# Patient Record
Sex: Male | Born: 1985
Health system: Southern US, Community
[De-identification: ages and names within clinical notes are randomized; demographics above are authoritative.]

---

## 2019-05-18 DIAGNOSIS — Z8673 Personal history of transient ischemic attack (TIA), and cerebral infarction without residual deficits: Secondary | ICD-10-CM

## 2019-05-18 HISTORY — DX: Personal history of transient ischemic attack (TIA), and cerebral infarction without residual deficits: Z86.73

## 2020-07-03 ENCOUNTER — Inpatient Hospital Stay (HOSPITAL_COMMUNITY): Payer: Medicaid Other | Admitting: Certified Registered Nurse Anesthetist

## 2020-07-03 ENCOUNTER — Encounter (HOSPITAL_COMMUNITY): Payer: Self-pay | Admitting: Neurology

## 2020-07-03 ENCOUNTER — Inpatient Hospital Stay (HOSPITAL_COMMUNITY)
Admission: EM | Admit: 2020-07-03 | Discharge: 2020-07-12 | DRG: 023 | Disposition: A | Discharge status: Rehabilitation Facility | Payer: Medicaid Other | Attending: Neurology | Admitting: Neurology

## 2020-07-03 ENCOUNTER — Inpatient Hospital Stay (HOSPITAL_COMMUNITY): Payer: Medicaid Other

## 2020-07-03 ENCOUNTER — Emergency Department (HOSPITAL_COMMUNITY): Payer: Medicaid Other

## 2020-07-03 ENCOUNTER — Other Ambulatory Visit: Payer: Self-pay

## 2020-07-03 ENCOUNTER — Encounter (HOSPITAL_COMMUNITY)
Admission: EM | Disposition: A | Discharge status: Rehabilitation Facility | Payer: Self-pay | Source: Home / Self Care | Attending: Neurology

## 2020-07-03 DIAGNOSIS — I63031 Cerebral infarction due to thrombosis of right carotid artery: Secondary | ICD-10-CM

## 2020-07-03 DIAGNOSIS — R509 Fever, unspecified: Secondary | ICD-10-CM

## 2020-07-03 DIAGNOSIS — R918 Other nonspecific abnormal finding of lung field: Secondary | ICD-10-CM

## 2020-07-03 DIAGNOSIS — G936 Cerebral edema: Secondary | ICD-10-CM | POA: Diagnosis not present

## 2020-07-03 DIAGNOSIS — I10 Essential (primary) hypertension: Secondary | ICD-10-CM | POA: Diagnosis present

## 2020-07-03 DIAGNOSIS — R4189 Other symptoms and signs involving cognitive functions and awareness: Secondary | ICD-10-CM | POA: Diagnosis present

## 2020-07-03 DIAGNOSIS — R4702 Dysphasia: Secondary | ICD-10-CM | POA: Diagnosis present

## 2020-07-03 DIAGNOSIS — R1310 Dysphagia, unspecified: Secondary | ICD-10-CM | POA: Diagnosis present

## 2020-07-03 DIAGNOSIS — E669 Obesity, unspecified: Secondary | ICD-10-CM | POA: Diagnosis present

## 2020-07-03 DIAGNOSIS — D62 Acute posthemorrhagic anemia: Secondary | ICD-10-CM | POA: Diagnosis not present

## 2020-07-03 DIAGNOSIS — J690 Pneumonitis due to inhalation of food and vomit: Secondary | ICD-10-CM | POA: Diagnosis not present

## 2020-07-03 DIAGNOSIS — Z87891 Personal history of nicotine dependence: Secondary | ICD-10-CM | POA: Diagnosis not present

## 2020-07-03 DIAGNOSIS — E785 Hyperlipidemia, unspecified: Secondary | ICD-10-CM | POA: Diagnosis present

## 2020-07-03 DIAGNOSIS — R29810 Facial weakness: Secondary | ICD-10-CM | POA: Diagnosis present

## 2020-07-03 DIAGNOSIS — J9601 Acute respiratory failure with hypoxia: Secondary | ICD-10-CM | POA: Diagnosis present

## 2020-07-03 DIAGNOSIS — I6930 Unspecified sequelae of cerebral infarction: Secondary | ICD-10-CM

## 2020-07-03 DIAGNOSIS — Z8249 Family history of ischemic heart disease and other diseases of the circulatory system: Secondary | ICD-10-CM | POA: Diagnosis not present

## 2020-07-03 DIAGNOSIS — R569 Unspecified convulsions: Secondary | ICD-10-CM | POA: Diagnosis present

## 2020-07-03 DIAGNOSIS — I639 Cerebral infarction, unspecified: Secondary | ICD-10-CM | POA: Diagnosis present

## 2020-07-03 DIAGNOSIS — I6601 Occlusion and stenosis of right middle cerebral artery: Secondary | ICD-10-CM

## 2020-07-03 DIAGNOSIS — R29717 NIHSS score 17: Secondary | ICD-10-CM | POA: Diagnosis present

## 2020-07-03 DIAGNOSIS — G8194 Hemiplegia, unspecified affecting left nondominant side: Secondary | ICD-10-CM | POA: Diagnosis present

## 2020-07-03 DIAGNOSIS — G928 Other toxic encephalopathy: Secondary | ICD-10-CM | POA: Diagnosis present

## 2020-07-03 DIAGNOSIS — Z6834 Body mass index (BMI) 34.0-34.9, adult: Secondary | ICD-10-CM

## 2020-07-03 DIAGNOSIS — R4781 Slurred speech: Secondary | ICD-10-CM | POA: Diagnosis present

## 2020-07-03 DIAGNOSIS — Z87898 Personal history of other specified conditions: Secondary | ICD-10-CM

## 2020-07-03 DIAGNOSIS — I63411 Cerebral infarction due to embolism of right middle cerebral artery: Principal | ICD-10-CM | POA: Diagnosis present

## 2020-07-03 DIAGNOSIS — Z20822 Contact with and (suspected) exposure to covid-19: Secondary | ICD-10-CM | POA: Diagnosis present

## 2020-07-03 DIAGNOSIS — R4701 Aphasia: Secondary | ICD-10-CM | POA: Diagnosis present

## 2020-07-03 DIAGNOSIS — G9340 Encephalopathy, unspecified: Secondary | ICD-10-CM | POA: Diagnosis present

## 2020-07-03 DIAGNOSIS — J9690 Respiratory failure, unspecified, unspecified whether with hypoxia or hypercapnia: Secondary | ICD-10-CM | POA: Diagnosis present

## 2020-07-03 DIAGNOSIS — I69391 Dysphagia following cerebral infarction: Secondary | ICD-10-CM

## 2020-07-03 DIAGNOSIS — I63511 Cerebral infarction due to unspecified occlusion or stenosis of right middle cerebral artery: Secondary | ICD-10-CM

## 2020-07-03 DIAGNOSIS — D72829 Elevated white blood cell count, unspecified: Secondary | ICD-10-CM | POA: Diagnosis not present

## 2020-07-03 HISTORY — PX: IR CT HEAD LTD: IMG2386

## 2020-07-03 HISTORY — PX: IR US GUIDE VASC ACCESS RIGHT: IMG2390

## 2020-07-03 HISTORY — PX: RADIOLOGY WITH ANESTHESIA: SHX6223

## 2020-07-03 HISTORY — PX: IR PERCUTANEOUS ART THROMBECTOMY/INFUSION INTRACRANIAL INC DIAG ANGIO: IMG6087

## 2020-07-03 HISTORY — PX: IR ANGIO INTRA EXTRACRAN SEL COM CAROTID INNOMINATE UNI L MOD SED: IMG5358

## 2020-07-03 LAB — CBC
HCT: 44.8 % (ref 39.0–52.0)
Hemoglobin: 15.6 g/dL (ref 13.0–17.0)
MCH: 31.6 pg (ref 26.0–34.0)
MCHC: 34.8 g/dL (ref 30.0–36.0)
MCV: 90.9 fL (ref 80.0–100.0)
Platelets: 238 10*3/uL (ref 150–400)
RBC: 4.93 MIL/uL (ref 4.22–5.81)
RDW: 11.8 % (ref 11.5–15.5)
WBC: 10.5 10*3/uL (ref 4.0–10.5)
nRBC: 0 % (ref 0.0–0.2)

## 2020-07-03 LAB — DIFFERENTIAL
Abs Immature Granulocytes: 0.33 10*3/uL — ABNORMAL HIGH (ref 0.00–0.07)
Basophils Absolute: 0.1 10*3/uL (ref 0.0–0.1)
Basophils Relative: 1 %
Eosinophils Absolute: 0.1 10*3/uL (ref 0.0–0.5)
Eosinophils Relative: 1 %
Immature Granulocytes: 3 %
Lymphocytes Relative: 26 %
Lymphs Abs: 2.7 10*3/uL (ref 0.7–4.0)
Monocytes Absolute: 0.9 10*3/uL (ref 0.1–1.0)
Monocytes Relative: 9 %
Neutro Abs: 6.3 10*3/uL (ref 1.7–7.7)
Neutrophils Relative %: 60 %

## 2020-07-03 LAB — COMPREHENSIVE METABOLIC PANEL
ALT: 36 U/L (ref 0–44)
AST: 39 U/L (ref 15–41)
Albumin: 4.4 g/dL (ref 3.5–5.0)
Alkaline Phosphatase: 86 U/L (ref 38–126)
Anion gap: 13 (ref 5–15)
BUN: 10 mg/dL (ref 6–20)
CO2: 21 mmol/L — ABNORMAL LOW (ref 22–32)
Calcium: 9.4 mg/dL (ref 8.9–10.3)
Chloride: 103 mmol/L (ref 98–111)
Creatinine, Ser: 1.01 mg/dL (ref 0.61–1.24)
GFR, Estimated: >60 mL/min (ref >60)
Glucose, Bld: 116 mg/dL — ABNORMAL HIGH (ref 70–99)
Potassium: 3.8 mmol/L (ref 3.5–5.1)
Sodium: 137 mmol/L (ref 135–145)
Total Bilirubin: 0.7 mg/dL (ref 0.3–1.2)
Total Protein: 7.4 g/dL (ref 6.5–8.1)

## 2020-07-03 LAB — I-STAT CHEM 8, ED
BUN: 12 mg/dL (ref 6–20)
Calcium, Ion: 1.11 mmol/L — ABNORMAL LOW (ref 1.15–1.40)
Chloride: 105 mmol/L (ref 98–111)
Creatinine, Ser: 0.8 mg/dL (ref 0.61–1.24)
Glucose, Bld: 108 mg/dL — ABNORMAL HIGH (ref 70–99)
HCT: 45 % (ref 39.0–52.0)
Hemoglobin: 15.3 g/dL (ref 13.0–17.0)
Potassium: 3.8 mmol/L (ref 3.5–5.1)
Sodium: 139 mmol/L (ref 135–145)
TCO2: 21 mmol/L — ABNORMAL LOW (ref 22–32)

## 2020-07-03 LAB — PROTIME-INR
INR: 1 (ref 0.8–1.2)
Prothrombin Time: 12.6 seconds (ref 11.4–15.2)

## 2020-07-03 LAB — POCT I-STAT 7, (LYTES, BLD GAS, ICA,H+H)
Acid-base deficit: 2 mmol/L (ref 0.0–2.0)
Bicarbonate: 24.2 mmol/L (ref 20.0–28.0)
Calcium, Ion: 1.16 mmol/L (ref 1.15–1.40)
HCT: 44 % (ref 39.0–52.0)
Hemoglobin: 15 g/dL (ref 13.0–17.0)
O2 Saturation: 99 %
Patient temperature: 98
Potassium: 4.3 mmol/L (ref 3.5–5.1)
Sodium: 137 mmol/L (ref 135–145)
TCO2: 26 mmol/L (ref 22–32)
pCO2 arterial: 46.8 mmHg (ref 32.0–48.0)
pH, Arterial: 7.319 — ABNORMAL LOW (ref 7.350–7.450)
pO2, Arterial: 127 mmHg — ABNORMAL HIGH (ref 83.0–108.0)

## 2020-07-03 LAB — RESPIRATORY PANEL BY RT PCR (FLU A&B, COVID)
Influenza A by PCR: NEGATIVE
Influenza B by PCR: NEGATIVE
SARS Coronavirus 2 by RT PCR: NEGATIVE

## 2020-07-03 LAB — URINALYSIS, ROUTINE W REFLEX MICROSCOPIC
Bilirubin Urine: NEGATIVE
Glucose, UA: NEGATIVE mg/dL
Hgb urine dipstick: NEGATIVE
Ketones, ur: NEGATIVE mg/dL
Leukocytes,Ua: NEGATIVE
Nitrite: NEGATIVE
Protein, ur: NEGATIVE mg/dL
Specific Gravity, Urine: >1.046 — ABNORMAL HIGH (ref 1.005–1.030)
pH: 5 (ref 5.0–8.0)

## 2020-07-03 LAB — CBG MONITORING, ED: Glucose-Capillary: 111 mg/dL — ABNORMAL HIGH (ref 70–99)

## 2020-07-03 LAB — RAPID URINE DRUG SCREEN, HOSP PERFORMED
Amphetamines: NOT DETECTED
Barbiturates: NOT DETECTED
Benzodiazepines: NOT DETECTED
Cocaine: NOT DETECTED
Opiates: NOT DETECTED
Tetrahydrocannabinol: NOT DETECTED

## 2020-07-03 LAB — MRSA PCR SCREENING: MRSA by PCR: NEGATIVE

## 2020-07-03 LAB — ETHANOL: Alcohol, Ethyl (B): <10 mg/dL (ref <10)

## 2020-07-03 LAB — FIBRINOGEN: Fibrinogen: 220 mg/dL (ref 210–475)

## 2020-07-03 LAB — HIV ANTIBODY (ROUTINE TESTING W REFLEX): HIV Screen 4th Generation wRfx: NONREACTIVE

## 2020-07-03 LAB — APTT: aPTT: 26 seconds (ref 24–36)

## 2020-07-03 SURGERY — IR WITH ANESTHESIA
Anesthesia: General

## 2020-07-03 MED ORDER — SUCCINYLCHOLINE CHLORIDE 200 MG/10ML IV SOSY
PREFILLED_SYRINGE | INTRAVENOUS | Status: DC | PRN
  Administered 2020-07-03: 140 mg via INTRAVENOUS

## 2020-07-03 MED ORDER — NITROGLYCERIN 1 MG/10 ML FOR IR/CATH LAB
INTRA_ARTERIAL | Status: DC | PRN
  Administered 2020-07-03 (×2): 25 ug via INTRA_ARTERIAL

## 2020-07-03 MED ORDER — IOHEXOL 350 MG/ML SOLN
60.0000 mL | Freq: Once | INTRAVENOUS | Status: AC | PRN
  Administered 2020-07-03: 60 mL via INTRAVENOUS

## 2020-07-03 MED ORDER — DOCUSATE SODIUM 50 MG/5ML PO LIQD
100.0000 mg | Freq: Two times a day (BID) | ORAL | Status: DC
  Administered 2020-07-04 – 2020-07-11 (×13): 100 mg via ORAL
  Filled 2020-07-03 (×15): qty 10

## 2020-07-03 MED ORDER — ACETAMINOPHEN 325 MG PO TABS
650.0000 mg | ORAL_TABLET | ORAL | Status: DC | PRN
  Administered 2020-07-04 – 2020-07-09 (×6): 650 mg via ORAL
  Filled 2020-07-03 (×6): qty 2

## 2020-07-03 MED ORDER — ACETAMINOPHEN 160 MG/5ML PO SOLN: 650.0000 mg | ORAL | Status: DC | PRN

## 2020-07-03 MED ORDER — EPTIFIBATIDE 20 MG/10ML IV SOLN
INTRAVENOUS | Status: AC
  Filled 2020-07-03: qty 10

## 2020-07-03 MED ORDER — CLEVIDIPINE BUTYRATE 0.5 MG/ML IV EMUL
0.0000 mg/h | INTRAVENOUS | Status: DC
  Administered 2020-07-03: 2 mg/h via INTRAVENOUS
  Administered 2020-07-03: 6 mg/h via INTRAVENOUS
  Administered 2020-07-04: 2 mg/h via INTRAVENOUS
  Administered 2020-07-04: 8 mg/h via INTRAVENOUS
  Filled 2020-07-03 (×4): qty 50

## 2020-07-03 MED ORDER — LABETALOL HCL 5 MG/ML IV SOLN
INTRAVENOUS | Status: DC | PRN
  Administered 2020-07-03: 2.5 mg via INTRAVENOUS
  Administered 2020-07-03: 7.5 mg via INTRAVENOUS
  Administered 2020-07-03 (×3): 10 mg via INTRAVENOUS

## 2020-07-03 MED ORDER — NITROGLYCERIN 1 MG/10 ML FOR IR/CATH LAB
100.0000 ug | Freq: Once | INTRA_ARTERIAL | Status: DC
  Filled 2020-07-03: qty 1

## 2020-07-03 MED ORDER — SODIUM CHLORIDE 0.9 % IV SOLN: INTRAVENOUS | Status: DC

## 2020-07-03 MED ORDER — LIDOCAINE 2% (20 MG/ML) 5 ML SYRINGE
INTRAMUSCULAR | Status: DC | PRN
  Administered 2020-07-03: 100 mg via INTRAVENOUS

## 2020-07-03 MED ORDER — ONDANSETRON HCL 4 MG/2ML IJ SOLN
INTRAMUSCULAR | Status: DC | PRN
  Administered 2020-07-03: 4 mg via INTRAVENOUS

## 2020-07-03 MED ORDER — LABETALOL HCL 5 MG/ML IV SOLN: 20.0000 mg | Freq: Once | INTRAVENOUS | Status: DC

## 2020-07-03 MED ORDER — SENNOSIDES-DOCUSATE SODIUM 8.6-50 MG PO TABS: 1.0000 | ORAL_TABLET | Freq: Every evening | ORAL | Status: DC | PRN

## 2020-07-03 MED ORDER — PROPOFOL 10 MG/ML IV BOLUS
INTRAVENOUS | Status: DC | PRN
  Administered 2020-07-03: 150 mg via INTRAVENOUS

## 2020-07-03 MED ORDER — VERAPAMIL HCL 2.5 MG/ML IV SOLN
INTRAVENOUS | Status: AC
  Filled 2020-07-03: qty 2

## 2020-07-03 MED ORDER — PROPOFOL 1000 MG/100ML IV EMUL
0.0000 ug/kg/min | INTRAVENOUS | Status: DC
  Administered 2020-07-03 – 2020-07-04 (×4): 50 ug/kg/min via INTRAVENOUS
  Filled 2020-07-03 (×3): qty 100

## 2020-07-03 MED ORDER — TIROFIBAN HCL IN NACL 5-0.9 MG/100ML-% IV SOLN
INTRAVENOUS | Status: AC
  Filled 2020-07-03: qty 100

## 2020-07-03 MED ORDER — ORAL CARE MOUTH RINSE
15.0000 mL | OROMUCOSAL | Status: DC
  Administered 2020-07-03 – 2020-07-04 (×5): 15 mL via OROMUCOSAL

## 2020-07-03 MED ORDER — PROPOFOL 500 MG/50ML IV EMUL
INTRAVENOUS | Status: DC | PRN
  Administered 2020-07-03: 80 ug/kg/min via INTRAVENOUS

## 2020-07-03 MED ORDER — CLEVIDIPINE BUTYRATE 0.5 MG/ML IV EMUL: 0.0000 mg/h | INTRAVENOUS | Status: DC

## 2020-07-03 MED ORDER — STROKE: EARLY STAGES OF RECOVERY BOOK
Freq: Once | Status: AC
  Filled 2020-07-03: qty 1

## 2020-07-03 MED ORDER — LACTATED RINGERS IV SOLN: INTRAVENOUS | Status: DC | PRN

## 2020-07-03 MED ORDER — FENTANYL CITRATE (PF) 100 MCG/2ML IJ SOLN
50.0000 ug | INTRAMUSCULAR | Status: DC | PRN
  Administered 2020-07-03: 50 ug via INTRAVENOUS
  Filled 2020-07-03: qty 2

## 2020-07-03 MED ORDER — SODIUM CHLORIDE 0.9 % IV SOLN
50.0000 mL | Freq: Once | INTRAVENOUS | Status: AC
  Administered 2020-07-03: 50 mL via INTRAVENOUS

## 2020-07-03 MED ORDER — ROCURONIUM BROMIDE 10 MG/ML (PF) SYRINGE
PREFILLED_SYRINGE | INTRAVENOUS | Status: DC | PRN
  Administered 2020-07-03: 60 mg via INTRAVENOUS

## 2020-07-03 MED ORDER — ACETAMINOPHEN 325 MG PO TABS: 650.0000 mg | ORAL_TABLET | ORAL | Status: DC | PRN

## 2020-07-03 MED ORDER — LORAZEPAM 2 MG/ML IJ SOLN
INTRAMUSCULAR | Status: AC
  Filled 2020-07-03: qty 1

## 2020-07-03 MED ORDER — SENNOSIDES-DOCUSATE SODIUM 8.6-50 MG PO TABS
1.0000 | ORAL_TABLET | Freq: Two times a day (BID) | ORAL | Status: DC
  Administered 2020-07-04 – 2020-07-11 (×15): 1 via ORAL
  Filled 2020-07-03 (×16): qty 1

## 2020-07-03 MED ORDER — DEXAMETHASONE SODIUM PHOSPHATE 10 MG/ML IJ SOLN
INTRAMUSCULAR | Status: DC | PRN
  Administered 2020-07-03: 4 mg via INTRAVENOUS

## 2020-07-03 MED ORDER — PANTOPRAZOLE SODIUM 40 MG IV SOLR
40.0000 mg | Freq: Every day | INTRAVENOUS | Status: DC
  Administered 2020-07-03 – 2020-07-04 (×2): 40 mg via INTRAVENOUS
  Filled 2020-07-03 (×2): qty 40

## 2020-07-03 MED ORDER — LORAZEPAM 2 MG/ML IJ SOLN: 1.0000 mg | Freq: Once | INTRAMUSCULAR | Status: DC

## 2020-07-03 MED ORDER — POLYETHYLENE GLYCOL 3350 17 G PO PACK
17.0000 g | PACK | Freq: Every day | ORAL | Status: DC
  Administered 2020-07-06 – 2020-07-11 (×6): 17 g via ORAL
  Filled 2020-07-03 (×7): qty 1

## 2020-07-03 MED ORDER — CANGRELOR TETRASODIUM 50 MG IV SOLR
INTRAVENOUS | Status: AC
  Filled 2020-07-03: qty 50

## 2020-07-03 MED ORDER — PROPOFOL 1000 MG/100ML IV EMUL
INTRAVENOUS | Status: AC
  Filled 2020-07-03: qty 100

## 2020-07-03 MED ORDER — ASPIRIN 81 MG PO CHEW
CHEWABLE_TABLET | ORAL | Status: AC
  Filled 2020-07-03: qty 1

## 2020-07-03 MED ORDER — CHLORHEXIDINE GLUCONATE 0.12% ORAL RINSE (MEDLINE KIT)
15.0000 mL | Freq: Two times a day (BID) | OROMUCOSAL | Status: DC
  Administered 2020-07-03 – 2020-07-11 (×14): 15 mL via OROMUCOSAL

## 2020-07-03 MED ORDER — VERAPAMIL HCL 2.5 MG/ML IV SOLN
INTRAVENOUS | Status: DC | PRN
  Administered 2020-07-03 (×2): 2.5 mg via INTRA_ARTERIAL

## 2020-07-03 MED ORDER — CEFAZOLIN SODIUM-DEXTROSE 2-4 GM/100ML-% IV SOLN
2.0000 g | Freq: Once | INTRAVENOUS | Status: AC
  Administered 2020-07-03: 2 g via INTRAVENOUS
  Filled 2020-07-03: qty 100

## 2020-07-03 MED ORDER — TICAGRELOR 90 MG PO TABS
ORAL_TABLET | ORAL | Status: AC
  Filled 2020-07-03: qty 2

## 2020-07-03 MED ORDER — LEVETIRACETAM IN NACL 1500 MG/100ML IV SOLN
1500.0000 mg | INTRAVENOUS | Status: DC
  Administered 2020-07-03: 1500 mg via INTRAVENOUS
  Filled 2020-07-03: qty 100

## 2020-07-03 MED ORDER — CLOPIDOGREL BISULFATE 300 MG PO TABS
ORAL_TABLET | ORAL | Status: AC
  Filled 2020-07-03: qty 1

## 2020-07-03 MED ORDER — NITROGLYCERIN 1 MG/10 ML FOR IR/CATH LAB
INTRA_ARTERIAL | Status: AC | PRN
  Administered 2020-07-03 (×2): 25 ug via INTRA_ARTERIAL

## 2020-07-03 MED ORDER — FENTANYL CITRATE (PF) 100 MCG/2ML IJ SOLN
50.0000 ug | INTRAMUSCULAR | Status: DC | PRN
  Administered 2020-07-03 – 2020-07-04 (×3): 100 ug via INTRAVENOUS
  Filled 2020-07-03 (×3): qty 2

## 2020-07-03 MED ORDER — ALTEPLASE (STROKE) FULL DOSE INFUSION
0.9000 mg/kg | Freq: Once | INTRAVENOUS | Status: AC
  Administered 2020-07-03: 81.3 mg via INTRAVENOUS
  Filled 2020-07-03: qty 100

## 2020-07-03 MED ORDER — IOHEXOL 300 MG/ML  SOLN
50.0000 mL | Freq: Once | INTRAMUSCULAR | Status: AC | PRN
  Administered 2020-07-03: 25 mL via INTRA_ARTERIAL

## 2020-07-03 MED ORDER — IOHEXOL 300 MG/ML  SOLN
150.0000 mL | Freq: Once | INTRAMUSCULAR | Status: AC | PRN
  Administered 2020-07-03: 75 mL via INTRA_ARTERIAL

## 2020-07-03 MED ORDER — ACETAMINOPHEN 650 MG RE SUPP: 650.0000 mg | RECTAL | Status: DC | PRN

## 2020-07-03 MED ORDER — ESMOLOL HCL 100 MG/10ML IV SOLN
INTRAVENOUS | Status: DC | PRN
  Administered 2020-07-03: 30 mg via INTRAVENOUS
  Administered 2020-07-03: 20 mg via INTRAVENOUS

## 2020-07-03 NOTE — Anesthesia Procedure Notes (Addendum)
Procedure Name: Intubation Performed by: Ezekiel Ina, CRNA Pre-anesthesia Checklist: Patient identified, Emergency Drugs available, Suction available and Patient being monitored Patient Re-evaluated:Patient Re-evaluated prior to induction Oxygen Delivery Method: Circle System Utilized Preoxygenation: Pre-oxygenation with 100% oxygen Induction Type: IV induction, Rapid sequence and Cricoid Pressure applied Laryngoscope Size: Miller and 2 Grade View: Grade I Tube type: Oral Tube size: 7.0 mm Number of attempts: 1 Airway Equipment and Method: Stylet and Oral airway Placement Confirmation: ETT inserted through vocal cords under direct vision,  positive ETCO2 and breath sounds checked- equal and bilateral Secured at: 22 cm Tube secured with: Tape Dental Injury: Teeth and Oropharynx as per pre-operative assessment

## 2020-07-03 NOTE — Progress Notes (Signed)
Attempted to call family mbr Tamela Oddi at [phone] to given her address of hospital. No answer received.

## 2020-07-03 NOTE — Consult Note (Signed)
NAME:  Mark Oneal, MRN:  [id number], DOB:  [birth year], LOS: 0 ADMISSION DATE:  07/03/2020, CONSULTATION DATE:  07/03/20 REFERRING MD:  Corliss Skains  CHIEF COMPLAINT:  ICA occlusion s/p revascularization   Brief History   Mark Oneal is a 34 y.o. male who was admitted 10/18 with right ICA, right MCA, right ACA occlusion.  He received tPA and was taken for IR revascularization.  History of present illness   Pt is encephelopathic; therefore, this HPI is obtained from chart review. Mark Oneal is a 34 y.o. male who has a PMH including but not limited to prior CVA per neuro H&P (? years ago in [place]).  He presented to [facility] ED 10/18 as code stroke.  He was at a supermarket when he was noted to have seizure like activity followed by left sided weakness.    CTA demonstrated R ICA and R MCA occlusions.  He received tPA at 1356.  He was then taken to IR for revascularization.  He had complete revascularization of occluded R ICA, R MCA, R ACA, and thereafter, was transferred to the neuro ICU for ongoing care.  PCCM was asked to assist with vent management.  Past Medical History  has Stroke (cerebrum) [facility]) on their problem list.  Significant Hospital Events   10/18 > Admit.  Consults:  IR, PCCM.  Procedures:  ETT 10/18 >   Significant Diagnostic Tests:  CT head 10/18 > no acute hemorrhage.  Suspected thrombus in right MCA. CTA head 10/18 > occlusion of right ICA with subsequent occlusion of proximal right M1 MCA. CT head 10/19 >  MRI brain 10/19 >  Echo 10/19 >   Micro Data:  COVID 10/18 > neg. Flu 10/18 > neg.  Antimicrobials:  None.   Interim history/subjective:  Sedated, comfortable.  Objective:  Blood pressure (!) 149/84, weight 90.3 kg.       No intake or output data in the 24 hours ending 07/03/20 1710 Filed Weights   07/03/20 1300  Weight: 90.3 kg    Examination: General: Adult male, in NAD. Neuro: Sedated, not following commands. HEENT: [place]/AT. Sclerae anicteric.  ETT  in place. Cardiovascular: RRR, no M/R/G.  Lungs: Respirations even and unlabored.  CTA bilaterally, No W/R/R.   Abdomen: BS x 4, soft, NT/ND.  Musculoskeletal: No gross deformities, no edema.  Skin: Intact, warm, no rashes.  Assessment & Plan:   R ICA, R MCA, R ACA occlusion - s/p tPA followed by IR revascularization. - Post procedure management per IR. - Stroke workup / management per neuro. - F/u on CT head, MRI brain, echo.   Hypertension. - Cleviprex PRN for goal SBP 120 - 140 per IR.  Respiratory insufficiency - due to inability to protect the airway in the setting of above. - Full vent support. - Assess ABG. - Wean as able. - Daily SBT with goal extubation in AM. - Bronchial hygiene. - Follow CXR.    Best Practice:  Diet: NPO. Pain/Anxiety/Delirium protocol (if indicated): Propofol gtt / Fentanyl PRN. RASS goal -1. VAP protocol (if indicated): In place. DVT prophylaxis: SCD's only. GI prophylaxis: PPI. Glucose control: SSI if glucose consistently > 180. Mobility: Bedrest. Code Status: Full. Family Communication: Per primary. Disposition: ICU.  Labs   CBC: Recent Labs  Lab 07/03/20 1340 07/03/20 1344  WBC  --  10.5  NEUTROABS  --  6.3  HGB 15.3 15.6  HCT 45.0 44.8  MCV  --  90.9  PLT  --  238   Basic Metabolic  Panel: Recent Labs  Lab 07/03/20 1340 07/03/20 1344  NA 139 137  K 3.8 3.8  CL 105 103  CO2  --  21*  GLUCOSE 108* 116*  BUN 12 10  CREATININE 0.80 1.01  CALCIUM  --  9.4   GFR: CrCl cannot be calculated (Unknown ideal weight.). Recent Labs  Lab 07/03/20 1344  WBC 10.5   Liver Function Tests: Recent Labs  Lab 07/03/20 1344  AST 39  ALT 36  ALKPHOS 86  BILITOT 0.7  PROT 7.4  ALBUMIN 4.4   No results for input(s): LIPASE, AMYLASE in the last 168 hours. No results for input(s): AMMONIA in the last 168 hours. ABG    Component Value Date/Time   TCO2 21 (L) 07/03/2020 1340    Coagulation Profile: Recent Labs  Lab  07/03/20 1344  INR 1.0   Cardiac Enzymes: No results for input(s): CKTOTAL, CKMB, CKMBINDEX, TROPONINI in the last 168 hours. HbA1C: No results found for: HGBA1C CBG: Recent Labs  Lab 07/03/20 1340  GLUCAP 111*    Review of Systems:   Unable to obtain as pt is encephalopathic.  Past medical history  He,  has no past medical history on file.   Surgical History   Unavailable.  Social History      Family history   His family history includes Hypertension in his father and mother.   Allergies Not on File   Home meds  Prior to Admission medications   Not on File    Critical care time: 35 min.    Rutherford Guys, [place] [facility] 07/03/2020, 5:10 PM

## 2020-07-03 NOTE — Progress Notes (Signed)
PHARMACIST CODE STROKE RESPONSE  Notified to mix tPA at 1:53 by Dr. Derry Lory Delivered tPA to RN at 1:56  tPA dose = 8.1 mg bolus over 1 minute followed by 73.2 mg for a total dose of 81.3 mg over 1 hour  Issues/delays encountered (if applicable): n/a  De Burrs  PGY1 pharmacy resident 07/03/20 2:02 PM

## 2020-07-03 NOTE — Anesthesia Procedure Notes (Signed)
Arterial Line Insertion Start/End10/18/2021 2:45 PM, 07/03/2020 2:53 PM Performed by: Ezekiel Ina, CRNA, CRNA  Patient location: Pre-op. Preanesthetic checklist: patient identified, IV checked, site marked, risks and benefits discussed, surgical consent, monitors and equipment checked, pre-op evaluation, timeout performed and anesthesia consent Lidocaine 1% used for infiltration Left, radial was placed Catheter size: 20 G Hand hygiene performed , maximum sterile barriers used  and Seldinger technique used  Attempts: 1 Procedure performed without using ultrasound guided technique. Following insertion, dressing applied. Post procedure assessment: normal and unchanged  Patient tolerated the procedure well with no immediate complications.

## 2020-07-03 NOTE — Progress Notes (Signed)
All belongings sent home with wife

## 2020-07-03 NOTE — H&P (Signed)
Neurology history and physical  Reason for Consult: Code stroke Referring Physician: Tegeler  CC: Left-sided plegia with observed seizure  History is obtained from: EMS  HPI: Mark Oneal is a 34 y.o. 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 Per his partner of 8 years, he walks with a slight limp bus is otherwise fine, took him 2 months to recover. His partner got back from the grocery store and patient was able to help her get the groceries inside at around 12:30. He had   Per EMS patient was last known normal at 12:30 on 07/03/20. His words appeared a little slurred when he brought some groceries in, he was feeling a little numb but significant other is not entirely sure what side it was. Then he collapsed on the ground and had jerking of his arms and legs concerning for a seizure. She called EMS, who arrived at the scene and found him to be weak in the LUE and LLE and gaze edviation to the right. He seemed awake, alert and talking to them.  Workup with CTH with hyperdense RMCA and ASPECTS of 8. tPA offered and I initially could not get in touch with the significant other about tPA but I did discuss this with her and she was okay with [place] giving him tPA given the noted concern for stroke and hyperdense RMCA suggestive of an LVO.  CTA head and neck with occlusion of the cervical right ICA with intracranial reconstitution and subsequent occlusion of the proximal right M1 MCA.  We were eventually able to get in touch with the significant other prior to thrombectomy and Dr. Estanislado Pandy did discuss risks and benefits and Ms. [name] gave [place] her consent for thrombectomy.  Thrombectomy with TICI 3 revascularization of the occluded right ICA and right MCA and right ACA. Patient was noted to have a right putamen and caudate hematoma in IR which was noted to be stable on repeat IR CT head about 2 hours later.  Given that the  Nicolaus size was stable, we opted against reversing TPA and instead closely monitoring with another CT head in about 6 hours and tightly controlling blood pressure.  Fibrinogen levels were ordered stat in case reversal is needed.  LKW: 12:30 tpa given?:  Yes hyperdense right MCA Premorbid modified Rankin scale (mRS): 0 Thrombectomy:  NIHSS: 17  Past Medical History: 05/2019: History of stroke involving cerebellum   Family History  Problem Relation Age of Onset  . Hypertension Mother   . Hypertension Father    Social History:   has no history on file for tobacco use, alcohol use, and drug use.  Medications  Current Facility-Administered Medications:  .   stroke: mapping our early stages of recovery book, , Does not apply, Once, Donnetta Simpers, MD .  0.9 %  sodium chloride infusion, , Intravenous, Continuous, Deveshwar, Sanjeev, MD, Last Rate: 75 mL/hr at 07/03/20 1934, Rate Verify at 07/03/20 1934 .  acetaminophen (TYLENOL) tablet 650 mg, 650 mg, Oral, Q4H PRN **OR** acetaminophen (TYLENOL) 160 MG/5ML solution 650 mg, 650 mg, Per Tube, Q4H PRN **OR** acetaminophen (TYLENOL) suppository 650 mg, 650 mg, Rectal, Q4H PRN, Deveshwar, Sanjeev, MD .  chlorhexidine gluconate (MEDLINE KIT) (PERIDEX) 0.12 % solution 15 mL, 15 mL, Mouth Rinse, BID, Donnetta Simpers, MD .  labetalol (NORMODYNE) injection 20 mg, 20 mg, Intravenous, Once **AND** clevidipine (CLEVIPREX) infusion 0.5 mg/mL, 0-21 mg/hr, Intravenous,  Continuous, Donnetta Simpers, MD, Last Rate: 2 mL/hr at 07/03/20 1934, 1 mg/hr at 07/03/20 1934 .  docusate (COLACE) 50 MG/5ML liquid 100 mg, 100 mg, Oral, BID, Desai, Rahul P, PA-C .  fentaNYL (SUBLIMAZE) injection 50 mcg, 50 mcg, Intravenous, Q15 min PRN, Desai, Rahul P, PA-C, 50 mcg at 07/03/20 1808 .  fentaNYL (SUBLIMAZE) injection 50-200 mcg, 50-200 mcg, Intravenous, Q30 min PRN, Desai, Rahul P, PA-C .  levETIRAcetam (KEPPRA) IVPB 1500 mg/ 100 mL premix, 1,500 mg, Intravenous,  STAT, Marliss Coots, PA-C .  LORazepam (ATIVAN) 2 MG/ML injection, , , ,  .  LORazepam (ATIVAN) injection 1 mg, 1 mg, Intravenous, Once, Donnetta Simpers, MD .  MEDLINE mouth rinse, 15 mL, Mouth Rinse, 10 times per day, Donnetta Simpers, MD .  nitroGLYCERIN 1 mg/10 mL (100 mcg/mL) - IR/CATH LAB, 100 mcg, Intra-arterial, Once, Donnetta Simpers, MD .  nitroGLYCERIN 1 mg/10 mL (100 mcg/mL) - IR/CATH LAB, , , PRN, Luanne Bras, MD, 25 mcg at 07/03/20 1634 .  pantoprazole (PROTONIX) injection 40 mg, 40 mg, Intravenous, QHS, Donnetta Simpers, MD .  polyethylene glycol (MIRALAX / GLYCOLAX) packet 17 g, 17 g, Oral, Daily, Desai, Rahul P, PA-C .  propofol (DIPRIVAN) 1000 MG/100ML infusion, 0-50 mcg/kg/min, Intravenous, Continuous, Desai, Rahul P, PA-C, Last Rate: 27 mL/hr at 07/03/20 1934, 50 mcg/kg/min at 07/03/20 1934 .  senna-docusate (Senokot-S) tablet 1 tablet, 1 tablet, Oral, QHS PRN, Donnetta Simpers, MD .  senna-docusate (Senokot-S) tablet 1 tablet, 1 tablet, Oral, BID, Donnetta Simpers, MD .  verapamil (ISOPTIN) 2.5 MG/ML injection, , , ,  .  verapamil (ISOPTIN) injection, , , PRN, Deveshwar, Sanjeev, MD, 2.5 mg at 07/03/20 1616  ROS: Unable to obtain due to altered mental status.    Exam: Current vital signs: BP 112/65   Pulse 79   Temp 98.2 F (36.8 C)   Resp 17   Ht _0  (1.626 m)   Wt 90.3 kg   SpO2 100%   BMI 34.17 kg/m  Vital signs in last 24 hours: Temp:  [98.2 F (36.8 C)] 98.2 F (36.8 C) (10/18 1723) Pulse Rate:  [79-82] 79 (10/18 1915) Resp:  [15-21] 17 (10/18 1915) BP: (105-149)/(61-95) 112/65 (10/18 1915) SpO2:  [100 %] 100 % (10/18 1915) Arterial Line BP: (117-148)/(60-76) 123/66 (10/18 1915) FiO2 (%):  [50 %] 50 % (10/18 1723) Weight:  [90.3 kg] 90.3 kg (10/18 1300)   Constitutional: Appears well-developed and well-nourished.  Psych: Significant confusion Eyes: No scleral injection HENT: No OP obstrucion Head: Normocephalic.   Cardiovascular: Normal rate and regular rhythm.  Respiratory: Effort normal, non-labored breathing GI: Soft.  No distension. There is no tenderness.  Skin: WDI  Neuro: Mental Status: Sitting in the bed, leaning on his left side. Asphasia out of proportion to encephalopathy.  Does follow commands in RUE and RLE. Cranial Nerves: II: Visual Fields are full.  III,IV, VI: R gaze deviation, no ptosis. Pupils equal, round and reactive to light V: Facial sensation is symmetric to temperature VII: L facial droop VIII: hearing is intact to voice X: Palat elevates symmetrically XI: R shoulder goes up with shoulder shrug.  Motor: Moving right arm and leg antigravity however is not moving left arm and leg.  Sensory: Absent in LUE and LLE DSS Deep Tendon Reflexes: 1+ and symmetric in the biceps and patellae. Plantars: Toes are downgoing bilaterally. Cerebellar: FNF and HKS are intact on the Right.  Labs I have reviewed labs in epic and the results pertinent to this consultation  are: Platelet count of 238, INR of 1.0.  CBC    Component Value Date/Time   WBC 10.5 07/03/2020 1344   RBC 4.93 07/03/2020 1344   HGB 15.6 07/03/2020 1344   HCT 44.8 07/03/2020 1344   PLT 238 07/03/2020 1344   MCV 90.9 07/03/2020 1344   MCH 31.6 07/03/2020 1344   MCHC 34.8 07/03/2020 1344   RDW 11.8 07/03/2020 1344   LYMPHSABS 2.7 07/03/2020 1344   MONOABS 0.9 07/03/2020 1344   EOSABS 0.1 07/03/2020 1344   BASOSABS 0.1 07/03/2020 1344    CMP     Component Value Date/Time   NA 137 07/03/2020 1344   K 3.8 07/03/2020 1344   CL 103 07/03/2020 1344   CO2 21 (L) 07/03/2020 1344   GLUCOSE 116 (H) 07/03/2020 1344   BUN 10 07/03/2020 1344   CREATININE 1.01 07/03/2020 1344   CALCIUM 9.4 07/03/2020 1344   PROT 7.4 07/03/2020 1344   ALBUMIN 4.4 07/03/2020 1344   AST 39 07/03/2020 1344   ALT 36 07/03/2020 1344   ALKPHOS 86 07/03/2020 1344   BILITOT 0.7 07/03/2020 1344   GFRNONAA >60 07/03/2020 1344     Imaging I have reviewed the images obtained:  CT-scan of the brain  MRI examination of the brain  Etta Quill PA-C [facility] [phone]  M-F  (9:00 am- 5:00 PM)  07/03/2020, 7:38 PM     Assessment:  34 year old male who presents to the hospital with left hemiplegia, neglect and right gaze deviation found to have hyperdense right MCA on CT head with an aspect score of 8.  tPA was given.  He was found to have cervical right ICA occlusion with distal reconstitution closer to P-comm and a right MCA M1 occlusion at the bifurcation.  He was taken for thrombectomy and noted to have a right putamen and caudate hematoma in the IR which was noted to be stable on repeat IR CT head about 2 hours later.  He had Tici 3 revascularization of right ICA, right MCA and right ACA.  We did discuss potential reversal of TPA however with the noted stability of the bleed on the repeat IR CT head and his fibrinogen level at 220, we instead opted for close monitoring with a repeat CT head in 6 hours and keeping very tight control of his blood pressure between 660-630 systolic.  Impression: - Ischemic R MCA M1 stroke - Acute R MCA M1 occlusion - R cervical ICA occlusion.  Plan: - Frequent NeuroChecks for post tPA care per Neurocritical Care Unit protocol: - Initial CTH demonstrated no acute hemorrhage or mass - MRI Brain - is pending - CTA - with R MCA M1 occlusion - TTE - pending - Lipid Panel: LDL - pending  - Statin: Atorvastatin 25m if LDL > 70. - HbA1c: pending - Antithrombotic: Start ASA 81 mg daily if 24 h CTH does not show acute hemorrhage - DVT prophylaxis: SCDs. Pharmacologic prophylaxis if 24 h CTH does not demonstrate acute hemorrhage - Systolic Blood Pressure goal: 1160-109systolic - Telemetry monitoring for arrhythmia. - Noted to have stable R BG IPH on IR CTH which was stable on repeat IR CTH about 2 hours later. We will get a repeat CTH in 6 hours to ensure  stability. - STAT Fibrinogen levels at 220. - Will need tPA reversal for worsening IPH on repeat CTH. - Swallow screen - ordered - PT/OT/SLP consults  Code Status: Full Code   This patient is critically ill and at significant  risk of neurological worsening, death and care requires constant monitoring of vital signs, hemodynamics,respiratory and cardiac monitoring, neurological assessment, discussion with family, other specialists and medical decision making of high complexity. I spent 110 minutes of neurocritical care time  in the care of  this patient. This was time spent independent of any time provided by nurse practitioner or PA.  Donnetta Simpers [facility] Pager Number [phone] 07/03/2020  7:50 PM

## 2020-07-03 NOTE — ED Provider Notes (Signed)
[facility] EMERGENCY DEPARTMENT Provider Note   CSN: [id number] Arrival date & time: 07/03/20  1333     History Chief Complaint  Patient presents with  . Code Stroke    Mark Oneal is a 34 y.o. male.  He is primarily Spanish-speaking.  He has a prior history of a stroke.  Per EMS he was last known well around 12 PM when he experienced a seizure followed by left-sided weakness and neglect.  He was a stroke activation and met by neurology on arrival and taken immediately to CT.  The history is provided by the patient and the EMS personnel.  Cerebrovascular Accident This is a new problem. The current episode started 1 to 2 hours ago. The problem occurs constantly. The problem has not changed since onset.Pertinent negatives include no chest pain, no abdominal pain, no headaches and no shortness of breath. Nothing aggravates the symptoms. Nothing relieves the symptoms. He has tried nothing for the symptoms. The treatment provided no relief.       History reviewed. No pertinent past medical history.  Patient Active Problem List   Diagnosis Date Noted  . Stroke (cerebrum) ([place]) 07/03/2020    History reviewed. No pertinent surgical history.     Family History  Problem Relation Age of Onset  . Hypertension Mother   . Hypertension Father     Social History   Tobacco Use  . Smoking status: Not on file  Substance Use Topics  . Alcohol use: Not on file  . Drug use: Not on file    Home Medications Prior to Admission medications   Not on File    Allergies    Patient has no allergy information on record.  Review of Systems   Review of Systems  Unable to perform ROS: Acuity of condition  Respiratory: Negative for shortness of breath.   Cardiovascular: Negative for chest pain.  Gastrointestinal: Negative for abdominal pain.  Neurological: Negative for headaches.    Physical Exam Updated Vital Signs BP (!) 149/84   Wt 90.3 kg   Physical  Exam Vitals and nursing note reviewed.  Constitutional:      Appearance: Normal appearance. He is well-developed.  HENT:     Head: Normocephalic and atraumatic.  Eyes:     Conjunctiva/sclera: Conjunctivae normal.  Cardiovascular:     Rate and Rhythm: Normal rate and regular rhythm.     Heart sounds: No murmur heard.   Pulmonary:     Effort: Pulmonary effort is normal. No respiratory distress.     Breath sounds: Normal breath sounds.  Abdominal:     Palpations: Abdomen is soft.     Tenderness: There is no abdominal tenderness.  Musculoskeletal:     Cervical back: Neck supple.  Skin:    General: Skin is warm and dry.  Neurological:     Mental Status: He is alert.     Motor: Weakness present.     Comments: Patient is awake and alert.  He is following commands with right upper extremity and right lower extremity.  He is got 0 out of 5 strength left upper extremity and 1 out of 5 strength left lower extremity.  Right gaze preference and left-sided neglect.     ED Results / Procedures / Treatments   Labs (all labs ordered are listed, but only abnormal results are displayed) Labs Reviewed  DIFFERENTIAL - Abnormal; Notable for the following components:      Result Value   Abs Immature Granulocytes 0.33 (*)  All other components within normal limits  COMPREHENSIVE METABOLIC PANEL - Abnormal; Notable for the following components:   CO2 21 (*)    Glucose, Bld 116 (*)    All other components within normal limits  CBG MONITORING, ED - Abnormal; Notable for the following components:   Glucose-Capillary 111 (*)    All other components within normal limits  I-STAT CHEM 8, ED - Abnormal; Notable for the following components:   Glucose, Bld 108 (*)    Calcium, Ion 1.11 (*)    TCO2 21 (*)    All other components within normal limits  RESPIRATORY PANEL BY RT PCR (FLU A&B, COVID)  ETHANOL  PROTIME-INR  APTT  CBC  RAPID URINE DRUG SCREEN, HOSP PERFORMED  URINALYSIS, ROUTINE W  REFLEX MICROSCOPIC  HIV ANTIBODY (ROUTINE TESTING W REFLEX)  FIBRINOGEN  HEMOGLOBIN A1C  LIPID PANEL    EKG None  Radiology CT HEAD CODE STROKE WO CONTRAST  Result Date: 07/03/2020 CLINICAL DATA:  Code stroke.  Left-sided weakness EXAM: CT HEAD WITHOUT CONTRAST TECHNIQUE: Contiguous axial images were obtained from the base of the skull through the vertex without intravenous contrast. COMPARISON:  None. FINDINGS: Brain: There is no acute intracranial hemorrhage. There is suspected hypoattenuation and loss of gray-white differentiation involving the anterior right temporal lobe and inferior lentiform nucleus. There is no mass effect. Ventricles and sulci are within normal limits in size and configuration. There are age-indeterminate but probably chronic right greater than left cerebellar infarcts. Vascular: There is increased density along the right M1 MCA. Skull: Unremarkable. Sinuses/Orbits: No acute abnormality. Other: Mastoid air cells are clear. ASPECTS [facility] Stroke Program Early CT Score) - Ganglionic level infarction (caudate, lentiform nuclei, internal capsule, insula, M1-M3 cortex): 5 - Supraganglionic infarction (M4-M6 cortex): 3 Total score (0-10 with 10 being normal): 8 IMPRESSION: There is no acute intracranial hemorrhage. Suspected thrombus within the right MCA and acute right MCA territory infarction. ASPECT score is 8. These results were called by telephone at the time of interpretation on 07/03/2020 at 1:54 pm to provider [facility] , who verbally acknowledged these results. Electronically Signed   By: Macy Mis M.D.   On: 07/03/2020 14:06   CT ANGIO HEAD CODE STROKE  Result Date: 07/03/2020 CLINICAL DATA:  Code stroke follow-up EXAM: CT ANGIOGRAPHY HEAD AND NECK TECHNIQUE: Multidetector CT imaging of the head and neck was performed using the standard protocol during bolus administration of intravenous contrast. Multiplanar CT image reconstructions and MIPs were  obtained to evaluate the vascular anatomy. Carotid stenosis measurements (when applicable) are obtained utilizing NASCET criteria, using the distal internal carotid diameter as the denominator. CONTRAST:  66m OMNIPAQUE IOHEXOL 350 MG/ML SOLN COMPARISON:  None. FINDINGS: CTA NECK FINDINGS Aortic arch: Great vessel origins are patent. Right carotid system: Common and external carotid arteries are patent. There is occlusion of the cervical ICA approximately 2 cm above the origin. No reconstitution in the neck. Left carotid system: Patent. Vertebral arteries: Patent.  Left vertebral artery is dominant. Skeleton: No acute abnormality. Other neck: No significant abnormality. Upper chest: Included upper lungs are clear. Review of the MIP images confirms the above findings CTA HEAD FINDINGS Anterior circulation: There is occlusion of the intracranial right ICA to the level of the PCOM origin where there is reconstitution. There is subsequent occlusion of the proximal right M1 MCA extending to the bifurcation where there is partial reconstitution. Left intracranial internal carotid, left middle cerebral artery, and both anterior cerebral arteries are patent. Mild calcified plaque  along the left ICA. Posterior circulation: Intracranial vertebral arteries, basilar artery, and posterior cerebral arteries are patent. There are right larger than left posterior communicating arteries. Venous sinuses: As permitted by contrast timing, patent. 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 Reconstitution intracranially by a posterior communicating artery. Subsequent occlusion of the proximal right M1 MCA extending to the bifurcation where there is partial reconstitution. These results were communicated to Dr. Lorrin Goodell at 2:13 pm on 07/03/2020 by text page via the [facility] messaging system. Electronically Signed   By: Macy Mis M.D.   On:  07/03/2020 14:23   CT ANGIO NECK CODE STROKE  Result Date: 07/03/2020 CLINICAL DATA:  Code stroke follow-up EXAM: CT ANGIOGRAPHY HEAD AND NECK TECHNIQUE: Multidetector CT imaging of the head and neck was performed using the standard protocol during bolus administration of intravenous contrast. Multiplanar CT image reconstructions and MIPs were obtained to evaluate the vascular anatomy. Carotid stenosis measurements (when applicable) are obtained utilizing NASCET criteria, using the distal internal carotid diameter as the denominator. CONTRAST:  34m OMNIPAQUE IOHEXOL 350 MG/ML SOLN COMPARISON:  None. FINDINGS: CTA NECK FINDINGS Aortic arch: Great vessel origins are patent. Right carotid system: Common and external carotid arteries are patent. There is occlusion of the cervical ICA approximately 2 cm above the origin. No reconstitution in the neck. Left carotid system: Patent. Vertebral arteries: Patent.  Left vertebral artery is dominant. Skeleton: No acute abnormality. Other neck: No significant abnormality. Upper chest: Included upper lungs are clear. Review of the MIP images confirms the above findings CTA HEAD FINDINGS Anterior circulation: There is occlusion of the intracranial right ICA to the level of the PCOM origin where there is reconstitution. There is subsequent occlusion of the proximal right M1 MCA extending to the bifurcation where there is partial reconstitution. Left intracranial internal carotid, left middle cerebral artery, and both anterior cerebral arteries are patent. Mild calcified plaque along the left ICA. Posterior circulation: Intracranial vertebral arteries, basilar artery, and posterior cerebral arteries are patent. There are right larger than left posterior communicating arteries. Venous sinuses: As permitted by contrast timing, patent. 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  Reconstitution intracranially by a posterior communicating artery. Subsequent occlusion of the proximal right M1 MCA extending to the bifurcation where there is partial reconstitution. These results were communicated to Dr. KLorrin Goodellat 2:13 pm on 07/03/2020 by text page via the [facility] system. Electronically Signed   By: PMacy MisM.D.   On: 07/03/2020 14:23    Procedures .Critical Care Performed by: BHayden Rasmussen MD Authorized by: BHayden Rasmussen MD   Critical care provider statement:    Critical care time (minutes):  45   Critical care time was exclusive of:  Separately billable procedures and treating other patients   Critical care was necessary to treat or prevent imminent or life-threatening deterioration of the following conditions:  CNS failure or compromise   Critical care was time spent personally by me on the following activities:  Discussions with consultants, evaluation of patient's response to treatment, examination of patient, ordering and performing treatments and interventions, ordering and review of laboratory studies, ordering and review of radiographic studies, pulse oximetry, re-evaluation of patient's condition, obtaining history from patient or surrogate, review of old charts and development of treatment plan with patient or surrogate   I assumed direction  of critical care for this patient from another provider in my specialty: no     (including critical care time)  Medications Ordered in ED Medications   stroke: mapping our early stages of recovery book ( Does not apply MAR Hold 07/03/20 1527)  0.9 %  sodium chloride infusion (has no administration in time range)  acetaminophen (TYLENOL) tablet 650 mg ( Oral MAR Hold 07/03/20 1527)    Or  acetaminophen (TYLENOL) 160 MG/5ML solution 650 mg ( Per Tube MAR Hold 07/03/20 1527)    Or  acetaminophen (TYLENOL) suppository 650 mg ( Rectal MAR Hold 07/03/20 1527)  senna-docusate (Senokot-S) tablet 1  tablet ( Oral MAR Hold 07/03/20 1527)  pantoprazole (PROTONIX) injection 40 mg ( Intravenous Automatically Held 07/11/20 2200)  labetalol (NORMODYNE) injection 20 mg ( Intravenous MAR Hold 07/03/20 1527)    And  clevidipine (CLEVIPREX) infusion 0.5 mg/mL ( Intravenous MAR Hold 07/03/20 1527)  LORazepam (ATIVAN) 2 MG/ML injection (has no administration in time range)  LORazepam (ATIVAN) injection 1 mg ( Intravenous MAR Hold 07/03/20 1527)  levETIRAcetam (KEPPRA) IVPB 1500 mg/ 100 mL premix ( Intravenous MAR Hold 07/03/20 1527)  nitroGLYCERIN 1 mg/10 mL (100 mcg/mL) - IR/CATH LAB ( Intra-arterial MAR Hold 07/03/20 1527)  aspirin 81 MG chewable tablet (has no administration in time range)  verapamil (ISOPTIN) 2.5 MG/ML injection (has no administration in time range)  ticagrelor (BRILINTA) 90 MG tablet (has no administration in time range)  clopidogrel (PLAVIX) 300 MG tablet (has no administration in time range)  cangrelor (KENGREAL) 50 MG SOLR (has no administration in time range)  tirofiban (AGGRASTAT) 5-0.9 MG/100ML-% injection (has no administration in time range)  eptifibatide (INTEGRILIN) 20 MG/10ML injection (has no administration in time range)  nitroGLYCERIN 1 mg/10 mL (100 mcg/mL) - IR/CATH LAB (25 mcg Intra-arterial Given 07/03/20 1634)  verapamil (ISOPTIN) injection (2.5 mg Intra-arterial Given 07/03/20 1616)  alteplase (ACTIVASE) 1 mg/mL infusion 81.3 mg (0 mg Intravenous Stopped 07/03/20 1456)    Followed by  0.9 %  sodium chloride infusion (0 mLs Intravenous Stopped 07/03/20 1528)  iohexol (OMNIPAQUE) 350 MG/ML injection 60 mL (60 mLs Intravenous Contrast Given 07/03/20 1411)  ceFAZolin (ANCEF) IVPB 2g/100 mL premix (2 g Intravenous Given 07/03/20 1448)  nitroGLYCERIN 1 mg/10 mL (100 mcg/mL) - IR/CATH LAB (25 mcg Intra-arterial Given 07/03/20 1541)  iohexol (OMNIPAQUE) 300 MG/ML solution 150 mL (75 mLs Intra-arterial Contrast Given 07/03/20 1648)  iohexol (OMNIPAQUE) 300 MG/ML  solution 50 mL (25 mLs Intra-arterial Contrast Given 07/03/20 1649)    ED Course  I have reviewed the triage vital signs and the nursing notes.  Pertinent labs & imaging results that were available during my care of the patient were reviewed by me and considered in my medical decision making (see chart for details).  Clinical Course as of Jul 03 1712  Mon Jul 03, 2020  1410 Patient's has M1 occlusion on angio.  Neuro is talking with interventional radiology for possible thrombectomy.   [MB]    Clinical Course User Index [MB] Hayden Rasmussen, MD   MDM Rules/Calculators/A&P                         This patient complains of seizure and left-sided weakness; this involves an extensive number of treatment Options and is a complaint that carries with it a high risk of complications and Morbidity. The differential includes stroke, bleed, dissection, hypoglycemia, seizure I ordered, reviewed and interpreted labs, which included CBC with  normal white count normal hemoglobin, chemistries fairly unremarkable, normal coags, Covid testing negative I ordered imaging studies which included CT head CT angio head and neck and I independently    visualized and interpreted imaging which showed acute LVO Additional history obtained from EMS Previous records obtained and reviewed in epic, no recent visits I consulted neurology Dr.Khaliqdina And discussed lab and imaging findings  Critical Interventions: Rapid evaluation of patient with acute neurologic symptoms requiring TPA and interventional radiology  After the interventions stated above, I reevaluated the patient and found patient to be critically ill and with acute neuro deficits.  He has been taken off to the neuroradiology suite for further procedures.   Final Clinical Impression(s) / ED Diagnoses Final diagnoses:  Stroke [facility])    Rx / DC Orders ED Discharge Orders    None       Hayden Rasmussen, MD 07/03/20 1718

## 2020-07-03 NOTE — Transfer of Care (Signed)
Immediate Anesthesia Transfer of Care Note  Patient: Mark Oneal  Procedure(s) Performed: IR WITH ANESTHESIA (N/A )  Patient Location: ICU  Anesthesia Type:General  Level of Consciousness: Patient remains intubated per anesthesia plan  Airway & Oxygen Therapy: Patient remains intubated per anesthesia plan and Patient placed on Ventilator (see vital sign flow sheet for setting)  Post-op Assessment: Report given to RN and Post -op Vital signs reviewed and stable  Post vital signs: Reviewed and stable  Last Vitals:  Vitals Value Taken Time  BP 123/74 07/03/20 1730  Temp    Pulse    Resp 18 07/03/20 1730  SpO2    Vitals shown include unvalidated device data.  Last Pain: There were no vitals filed for this visit.       Complications: No complications documented.

## 2020-07-03 NOTE — Progress Notes (Signed)
Patient transported to CT scan and back to 4N15 without incident.

## 2020-07-03 NOTE — Code Documentation (Signed)
Pt is 34 yr old man last known well at 1200. Pt brought in by [facility] at 1333. Code stroke was called at 1311. Per EMS, pt was last known well at noon at which time he had a witnessed seizure. After the seizure he was not moving his left side well and had a left facial droop. Per EMS pt had a prior CVA in 2019 from which he recovered well, he only walked with a limp. At the bridge pt has left hemiplegia and rt gaze deviation and turns head to right. NIHSS is 17 (see flowsheet for details). Blood taken by phlebotomy. Airway cleared by EDP at 1335. Pt taken to CT at 1342. Pt was weighed (90.3 kg) and CBG was taken (111). NCCT negative for hemorrhage by neurologist.Order received to give TPA at 1351. TPA delivered to pt at 1355. TPA (81.3mg ) hung at 1356. Obtaining additional access for further scans. CTA obtained. Pt making jerky non-rhythmic movements and keeps turning head to the right. 1 mg ativan given per order by RRN.. IR team given a heads up call at 1400 that pt appeared LVO + by exam. Code IR ordered by neurologist at 1408. carelink notified. Code IR page received at 1411. Pt loaded with 1500mg  Keppra see MAR.Covid swab sent off.. Pt taken to [place] [facility]at 1414. Report given to [facility] RN in IR. Pt intubated by Anesthesia at 1426. Pt to IR suite 1432. Consent which was obtained by Dr [facility] and Dr Corliss Skains placed in IR suite office.

## 2020-07-03 NOTE — CV Procedure (Signed)
Echocardiogram not completed, patient off the floor for other testing.  Mark Oneal RDCS

## 2020-07-03 NOTE — Anesthesia Preprocedure Evaluation (Addendum)
Anesthesia Evaluation  Patient identified by MRN, date of birth, ID band Patient awake    Reviewed: Allergy & Precautions, Patient's Chart, lab work & pertinent test resultsPreop documentation limited or incomplete due to emergent nature of procedure.  Airway Mallampati: II  TM Distance: >3 FB     Dental   Pulmonary neg pulmonary ROS,           Cardiovascular negative cardio ROS   Rhythm:Regular Rate:Normal     Neuro/Psych CVA    GI/Hepatic negative GI ROS, Neg liver ROS,   Endo/Other  negative endocrine ROS  Renal/GU negative Renal ROS     Musculoskeletal negative musculoskeletal ROS (+)   Abdominal   Peds  Hematology negative hematology ROS (+)   Anesthesia Other Findings CODE STROKE  Reproductive/Obstetrics                            Anesthesia Physical Anesthesia Plan  ASA: III and emergent  Anesthesia Plan: General   Post-op Pain Management:    Induction: Intravenous and Rapid sequence  PONV Risk Score and Plan: 2 and Ondansetron, Dexamethasone and Treatment may vary due to age or medical condition  Airway Management Planned: Oral ETT  Additional Equipment: Arterial line  Intra-op Plan:   Post-operative Plan: Possible Post-op intubation/ventilation  Informed Consent:     Only emergency history available  Plan Discussed with: CRNA, Anesthesiologist and Surgeon  Anesthesia Plan Comments:        Anesthesia Quick Evaluation

## 2020-07-03 NOTE — Progress Notes (Signed)
Patient ID: Mark Oneal, male   DOB: 1986/01/10, 34 y.o.   MRN: [id number] INR. 37 Y RT H M LSW 12Noon today . New onset seizure with Lt sided neglect and lt sided weakness. Ct brain NO ICH ASPECTS 8 CTA occluded RT ICA terminus and RT MCA and ACA ,and extracranial ICA. IVTPA given. Endovascular treatment D/W common law spouse via a 3 way call thru an interpreter in the presence of the  neurologist. Procedure,reasons,alternatives reviewed. Risk of ICH of 10 %,worsening neuro condition,inability to revascularize and death reviewed. Spouse expressed understanding and provided consent for the treatment. S.Lucely Leard MD

## 2020-07-03 NOTE — Sedation Documentation (Signed)
Right femoral sheath removed, angio seal closure device deployed @ 1645

## 2020-07-03 NOTE — Procedures (Signed)
SA/P bilateral common carotid artreriograms. RT CFA approach  S/P complete revascularization of occluded Rt ICA extracranially and ICA terminus ,RT MCA and RT ACA with x 1 pass with solitaite 2mm x 40 mm retriever and aspiration and prox flow arrest  Achieving a TICI 3 revascularization due to ?prox RT ICA dissection. Post procedure CT shows a stable RT putamen and ?caudate hematoma.. 65F angioseal used  for hemostasis . Distal pulses all dopplerable. Left intubated as per anesthesia. S.Latrica Clowers MD

## 2020-07-03 NOTE — ED Notes (Signed)
Report given by stroke RN. Pt taken to NIR. Youlanda Mighty was the accepting RN, Consuella Lose stroke RN and Mitzi Davenport RR RN remained in IR with pt. Anesthesia at bedside

## 2020-07-04 ENCOUNTER — Inpatient Hospital Stay (HOSPITAL_COMMUNITY): Payer: Medicaid Other

## 2020-07-04 ENCOUNTER — Encounter (HOSPITAL_COMMUNITY): Payer: Self-pay | Admitting: Radiology

## 2020-07-04 DIAGNOSIS — I6389 Other cerebral infarction: Secondary | ICD-10-CM

## 2020-07-04 DIAGNOSIS — E782 Mixed hyperlipidemia: Secondary | ICD-10-CM

## 2020-07-04 DIAGNOSIS — Z8673 Personal history of transient ischemic attack (TIA), and cerebral infarction without residual deficits: Secondary | ICD-10-CM

## 2020-07-04 DIAGNOSIS — R609 Edema, unspecified: Secondary | ICD-10-CM

## 2020-07-04 DIAGNOSIS — J9622 Acute and chronic respiratory failure with hypercapnia: Secondary | ICD-10-CM

## 2020-07-04 LAB — LIPID PANEL
Cholesterol: 293 mg/dL — ABNORMAL HIGH (ref 0–200)
HDL: 29 mg/dL — ABNORMAL LOW (ref >40)
LDL Cholesterol: UNDETERMINED mg/dL (ref 0–99)
Total CHOL/HDL Ratio: 10.1 RATIO
Triglycerides: 823 mg/dL — ABNORMAL HIGH (ref <150)
VLDL: UNDETERMINED mg/dL (ref 0–40)

## 2020-07-04 LAB — BASIC METABOLIC PANEL
Anion gap: 12 (ref 5–15)
BUN: 11 mg/dL (ref 6–20)
CO2: 21 mmol/L — ABNORMAL LOW (ref 22–32)
Calcium: 8.4 mg/dL — ABNORMAL LOW (ref 8.9–10.3)
Chloride: 102 mmol/L (ref 98–111)
Creatinine, Ser: 0.95 mg/dL (ref 0.61–1.24)
GFR, Estimated: >60 mL/min (ref >60)
Glucose, Bld: 139 mg/dL — ABNORMAL HIGH (ref 70–99)
Potassium: 4 mmol/L (ref 3.5–5.1)
Sodium: 135 mmol/L (ref 135–145)

## 2020-07-04 LAB — LDL CHOLESTEROL, DIRECT: Direct LDL: 151 mg/dL — ABNORMAL HIGH (ref 0–99)

## 2020-07-04 LAB — SEDIMENTATION RATE: Sed Rate: 3 mm/hr (ref 0–16)

## 2020-07-04 LAB — CBC
HCT: 40.3 % (ref 39.0–52.0)
Hemoglobin: 14.2 g/dL (ref 13.0–17.0)
MCH: 32.1 pg (ref 26.0–34.0)
MCHC: 35.2 g/dL (ref 30.0–36.0)
MCV: 91 fL (ref 80.0–100.0)
Platelets: 222 10*3/uL (ref 150–400)
RBC: 4.43 MIL/uL (ref 4.22–5.81)
RDW: 11.6 % (ref 11.5–15.5)
WBC: 16.1 10*3/uL — ABNORMAL HIGH (ref 4.0–10.5)
nRBC: 0 % (ref 0.0–0.2)

## 2020-07-04 LAB — RPR: RPR Ser Ql: NONREACTIVE

## 2020-07-04 LAB — SODIUM: Sodium: 135 mmol/L (ref 135–145)

## 2020-07-04 LAB — TRIGLYCERIDES: Triglycerides: 854 mg/dL — ABNORMAL HIGH (ref <150)

## 2020-07-04 LAB — TSH: TSH: 1.771 u[IU]/mL (ref 0.350–4.500)

## 2020-07-04 LAB — ECHOCARDIOGRAM COMPLETE
Area-P 1/2: 5.75 cm2
Height: 64 in
S' Lateral: 2.4 cm
Weight: 3185.21 oz

## 2020-07-04 LAB — VITAMIN B12: Vitamin B-12: 182 pg/mL (ref 180–914)

## 2020-07-04 MED ORDER — ASPIRIN 300 MG RE SUPP
300.0000 mg | Freq: Every day | RECTAL | Status: DC
  Administered 2020-07-04: 300 mg via RECTAL
  Filled 2020-07-04 (×2): qty 1

## 2020-07-04 MED ORDER — SODIUM CHLORIDE 0.9 % IV SOLN
INTRAVENOUS | Status: DC | PRN
  Administered 2020-07-04: 500 mL via INTRAVENOUS

## 2020-07-04 MED ORDER — SODIUM CHLORIDE 3 % IV BOLUS
250.0000 mL | Freq: Once | INTRAVENOUS | Status: AC
  Administered 2020-07-04: 250 mL via INTRAVENOUS
  Filled 2020-07-04: qty 250

## 2020-07-04 MED ORDER — ORAL CARE MOUTH RINSE
15.0000 mL | Freq: Two times a day (BID) | OROMUCOSAL | Status: DC
  Administered 2020-07-04 – 2020-07-12 (×11): 15 mL via OROMUCOSAL

## 2020-07-04 MED ORDER — LEVETIRACETAM IN NACL 500 MG/100ML IV SOLN
500.0000 mg | Freq: Two times a day (BID) | INTRAVENOUS | Status: DC
  Administered 2020-07-04 – 2020-07-05 (×3): 500 mg via INTRAVENOUS
  Filled 2020-07-04 (×3): qty 100

## 2020-07-04 MED ORDER — CHLORHEXIDINE GLUCONATE CLOTH 2 % EX PADS
6.0000 | MEDICATED_PAD | Freq: Every day | CUTANEOUS | Status: DC
  Administered 2020-07-04 – 2020-07-12 (×6): 6 via TOPICAL

## 2020-07-04 MED ORDER — SODIUM CHLORIDE 3 % IV SOLN
INTRAVENOUS | Status: DC
  Filled 2020-07-04 (×4): qty 500

## 2020-07-04 NOTE — Procedures (Signed)
Extubation Procedure Note  Patient Details:   Name: Mark Oneal DOB: 12-25-1985 MRN: [id number]   Airway Documentation:    Vent end date: 07/04/20 Vent end time: 0838   Evaluation  O2 sats: stable throughout Complications: No apparent complications Patient did tolerate procedure well. Bilateral Breath Sounds: Rhonchi   Yes, pt could speak post extubation.  Pt extubated to 2 l/m [place] without difficulty.  Audrie Lia 07/04/2020, 8:39 AM

## 2020-07-04 NOTE — Progress Notes (Signed)
SLP Cancellation Note  Patient Details Name: Erhardt Dada MRN: [id number] DOB: [birth year]   Cancelled treatment:       Reason Eval/Treat Not Completed: Medical issues which prohibited therapy (pt on vent this am). Will f/u as able.   Mahala Menghini., M.A. CCC-SLP Acute Rehabilitation Services Pager [phone] Office [phone]  07/04/2020, 8:23 AM

## 2020-07-04 NOTE — Evaluation (Signed)
Occupational Therapy Evaluation Patient Details Name: Mark Oneal MRN: [id number] DOB: [birth year] Today's Date: 07/04/2020    History of Present Illness Mark Oneal is a 34 y.o. 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 Pt found to have occluded R ICA, MCA, and ACA requiring thrombectomy by IR. Pt also received tPA.   Clinical Impression   This 34 y/o male presents with the above. PTA pt reports being independent with ADL, iADL and functional mobility. Pt currently presenting with the above and below listed deficits including L inattention/neglect, L side weakness, poor sitting/standing balance. Pt requiring two person assist for sit<>stand and to facilitate taking few steps along EOB. He requires up to maxA for UB ADL and maxA(+2) for LB ADL. Pt requiring up to max cues to locate and identify items in L visual field. VSS throughout on RA. He will benefit from continued acute OT services and recommend follow up therapy services in CIR setting after discharge to maximize his overall safety and independence with ADL and mobility.     Follow Up Recommendations  CIR    Equipment Recommendations  Other (comment) (TBD)    Recommendations for Other Services Rehab consult     Precautions / Restrictions Precautions Precautions: Fall Precaution Comments: L neglect Restrictions Weight Bearing Restrictions: No      Mobility Bed Mobility Overal bed mobility: Needs Assistance Bed Mobility: Rolling;Sidelying to Sit Rolling: Mod assist Sidelying to sit: Mod assist       General bed mobility comments: max directional cues, max for L UE movement, pt able to bring LEs off EOB, modA for trunk elevation  Transfers Overall transfer level: Needs assistance Equipment used: 2 person hand held assist Transfers: Sit to/from Stand Sit to Stand: Mod  assist;+2 physical assistance         General transfer comment: modAx2, L knee blocked, posterior hip support to achieve full upright posture, side stepped to [facility], maxA to to advance L LE to the R, L knee blocked as L knee buckles with advancement of R LE    Balance Overall balance assessment: Needs assistance Sitting-balance support: Feet supported;Single extremity supported Sitting balance-Leahy Scale: Fair Sitting balance - Comments: pt with no use of L UE to support self, pt with L lateral lean, unable to self correct   Standing balance support: During functional activity Standing balance-Leahy Scale: Zero Standing balance comment: dependent on external support                           ADL either performed or assessed with clinical judgement   ADL Overall ADL's : Needs assistance/impaired Eating/Feeding: NPO   Grooming: Moderate assistance;Sitting   Upper Body Bathing: Moderate assistance;Sitting   Lower Body Bathing: Maximal assistance;+2 for physical assistance;+2 for safety/equipment;Sit to/from stand   Upper Body Dressing : Maximal assistance;Sitting   Lower Body Dressing: Maximal assistance;+2 for physical assistance;+2 for safety/equipment;Sit to/from stand Lower Body Dressing Details (indicate cue type and reason): attempts to don socks at EOB but ultimately requiring assist including assist for sitting balance and to support LEs in modified figure 4     Toileting- Clothing Manipulation and Hygiene: Total assistance;+2 for physical assistance;+2 for safety/equipment;Sit to/from stand       Functional mobility during ADLs: Maximal assistance;+2 for physical assistance;+2 for safety/equipment (sit<>Stand and side  steps along EOB)       Vision   Vision Assessment?: Yes;Vision impaired- to be further tested in functional context Eye Alignment: Within Functional Limits Additional Comments: L inattention/neglect noted and max cues required to locate and  identify items in L visual field - when cued pt is able to identify accurately      Perception Perception Perception Tested?: Yes Perception Deficits: Inattention/neglect Inattention/Neglect: Does not attend to left visual field;Does not attend to left side of body;Impaired- to be further tested in functional context   Praxis      Pertinent Vitals/Pain Pain Assessment: No/denies pain Faces Pain Scale: No hurt     Hand Dominance Right   Extremity/Trunk Assessment Upper Extremity Assessment Upper Extremity Assessment: LUE deficits/detail LUE Deficits / Details: inconsistenties noted with active movement in LUE (minimal to none) with decreased sensation and proprioceoption noted  LUE Sensation: decreased light touch;decreased proprioception LUE Coordination: decreased fine motor;decreased gross motor   Lower Extremity Assessment Lower Extremity Assessment: Defer to PT evaluation   Cervical / Trunk Assessment Cervical / Trunk Assessment: Normal   Communication Communication Communication: Expressive difficulties   Cognition Arousal/Alertness: Awake/alert (sleepy but responsive and participatory) Behavior During Therapy: Flat affect Overall Cognitive Status: Impaired/Different from baseline Area of Impairment: Safety/judgement;Problem solving                         Safety/Judgement: Decreased awareness of deficits (L neglect)   Problem Solving: Slow processing;Difficulty sequencing;Requires verbal cues;Requires tactile cues General Comments: pt difficulty processing due to L neglect and L weakness/impaired proprioceptoin   General Comments  VSS on RA     Exercises     Shoulder Instructions      Home Living Family/patient expects to be discharged to:: Private residence Living Arrangements: Spouse/significant other;Children (6 yo dtr, 2 yo son) Available Help at Discharge: Family;Available PRN/intermittently Type of Home: House       Home Layout: One  level     Bathroom Shower/Tub: Chief Strategy Officer: Standard            Lives With: Spouse;Son;Daughter (son has autism)    Prior Functioning/Environment Level of Independence: Independent        Comments: works in Holiday representative; reports has a 52 y/o daughter and 2 y/o son        OT Problem List: Decreased strength;Decreased range of motion;Decreased activity tolerance;Impaired vision/perception;Impaired balance (sitting and/or standing);Decreased coordination;Decreased cognition;Decreased safety awareness;Decreased knowledge of use of DME or AE;Impaired UE functional use      OT Treatment/Interventions: Self-care/ADL training;Therapeutic exercise;Neuromuscular education;Energy conservation;DME and/or AE instruction;Therapeutic activities;Cognitive remediation/compensation;Patient/family education;Balance training;Visual/perceptual remediation/compensation    OT Goals(Current goals can be found in the care plan section) Acute Rehab OT Goals Patient Stated Goal: get better OT Goal Formulation: With patient Time For Goal Achievement: 07/18/20 Potential to Achieve Goals: Good  OT Frequency: Min 3X/week   Barriers to D/C:            Co-evaluation PT/OT/SLP Co-Evaluation/Treatment: Yes Reason for Co-Treatment: Complexity of the patient's impairments (multi-system involvement);For patient/therapist safety;To address functional/ADL transfers   OT goals addressed during session: ADL's and self-care      AM-PAC OT "6 Clicks" Daily Activity     Outcome Measure Help from another person eating meals?: A Lot Help from another person taking care of personal grooming?: A Lot Help from another person toileting, which includes using toliet, bedpan, or urinal?: Total Help from another person bathing (including washing, rinsing,  drying)?: A Lot Help from another person to put on and taking off regular upper body clothing?: A Lot Help from another person to put on and  taking off regular lower body clothing?: A Lot 6 Click Score: 11   End of Session Equipment Utilized During Treatment: Gait belt Nurse Communication: Mobility status  Activity Tolerance: Patient tolerated treatment well Patient left: in bed;with call bell/phone within reach;with bed alarm set  OT Visit Diagnosis: Hemiplegia and hemiparesis;Other symptoms and signs involving cognitive function;Other abnormalities of gait and mobility (R26.89) Hemiplegia - Right/Left: Left Hemiplegia - dominant/non-dominant: Non-Dominant Hemiplegia - caused by: Cerebral infarction                Time: 1210-1238 OT Time Calculation (min): 28 min Charges:  OT General Charges $OT Visit: 1 Visit OT Evaluation $OT Eval Moderate Complexity: 1 Mod  Marcy Siren, OT Acute Rehabilitation Services Pager [phone] Office [phone]   Orlando Penner 07/04/2020, 4:11 PM

## 2020-07-04 NOTE — Progress Notes (Signed)
EEG complete - results pending 

## 2020-07-04 NOTE — Evaluation (Signed)
Physical Therapy Evaluation Patient Details Name: Mark Oneal MRN: [id number] DOB: [birth year] Today's Date: 07/04/2020   History of Present Illness  Mark Oneal is a 34 y.o. 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 Pt found to have occluded R ICA, MCA, and ACA requiring thrombectomy by IR. Pt also received tPA.    Clinical Impression  Pt admitted with above. Pt was indep and working PTA. Pt presenting with L sided weakness, L sided neglect, impaired sequencing, and delayed processing. Pt very motivated despite being sleepy. Pt currently requiring modA x2 for transfers and OOB Mobility. Recommend CIR upon d/c for maximal rehab recovery for safe transition home.    Follow Up Recommendations CIR    Equipment Recommendations   (TBD)    Recommendations for Other Services Rehab consult     Precautions / Restrictions Precautions Precautions: Fall Precaution Comments: L neglect Restrictions Weight Bearing Restrictions: No      Mobility  Bed Mobility Overal bed mobility: Needs Assistance Bed Mobility: Rolling;Sidelying to Sit Rolling: Mod assist Sidelying to sit: Mod assist       General bed mobility comments: max directional cues, max for L UE movement, pt able to bring LEs off EOB, modA for trunk elevation  Transfers Overall transfer level: Needs assistance Equipment used: 2 person hand held assist Transfers: Sit to/from Stand Sit to Stand: Mod assist;+2 physical assistance         General transfer comment: modAx2, L knee blocked, posterior hip support to achieve full upright posture, side stepped to [facility], maxA to to advance L LE to the R, L knee blocked as L knee buckles with advancement of R LE  Ambulation/Gait             General Gait Details: unable at this time  Stairs            Wheelchair Mobility     Modified Rankin (Stroke Patients Only) Modified Rankin (Stroke Patients Only) Pre-Morbid Rankin Score: No significant disability Modified Rankin: Moderately severe disability     Balance Overall balance assessment: Needs assistance Sitting-balance support: Feet supported;Single extremity supported Sitting balance-Leahy Scale: Fair Sitting balance - Comments: pt with no use of L UE to support self, pt with L lateral lean, unable to self correct   Standing balance support: During functional activity Standing balance-Leahy Scale: Zero Standing balance comment: dependent on external support                             Pertinent Vitals/Pain Pain Assessment: No/denies pain    Home Living Family/patient expects to be discharged to:: Private residence Living Arrangements: Spouse/significant other;Children (6 yo dtr, 2 yo son) Available Help at Discharge: Family;Available PRN/intermittently Type of Home: House       Home Layout: One level        Prior Function Level of Independence: Independent         Comments: works in Psychologist, counselling   Dominant Hand: Right    Extremity/Trunk Assessment   Upper Extremity Assessment Upper Extremity Assessment: Defer to OT evaluation    Lower Extremity Assessment Lower Extremity Assessment: LLE deficits/detail LLE Deficits / Details: grossly 2+/5, withdraw to pain    Cervical / Trunk Assessment Cervical / Trunk Assessment: Normal  Communication  Communication: Expressive difficulties  Cognition Arousal/Alertness: Awake/alert (sleepy but responsive and participatory) Behavior During Therapy: Flat affect Overall Cognitive Status: Impaired/Different from baseline Area of Impairment: Safety/judgement;Problem solving                         Safety/Judgement: Decreased awareness of deficits (L neglect)   Problem Solving: Slow processing;Difficulty sequencing;Requires verbal cues;Requires  tactile cues General Comments: pt difficulty processing due to L neglect and L weakness/impaired proprioceptoin      General Comments General comments (skin integrity, edema, etc.): VSS    Exercises     Assessment/Plan    PT Assessment Patient needs continued PT services  PT Problem List Decreased strength;Decreased range of motion;Decreased activity tolerance;Decreased balance;Decreased mobility;Decreased coordination;Decreased cognition       PT Treatment Interventions Gait training;Stair training;Functional mobility training;Therapeutic activities;Therapeutic exercise;Balance training;Neuromuscular re-education    PT Goals (Current goals can be found in the Care Plan section)  Acute Rehab PT Goals Patient Stated Goal: get better PT Goal Formulation: With patient Time For Goal Achievement: 07/18/20 Potential to Achieve Goals: Good    Frequency Min 4X/week   Barriers to discharge        Co-evaluation PT/OT/SLP Co-Evaluation/Treatment: Yes Reason for Co-Treatment: Complexity of the patient's impairments (multi-system involvement) PT goals addressed during session: Mobility/safety with mobility         AM-PAC PT "6 Clicks" Mobility  Outcome Measure Help needed turning from your back to your side while in a flat bed without using bedrails?: A Lot Help needed moving from lying on your back to sitting on the side of a flat bed without using bedrails?: A Lot Help needed moving to and from a bed to a chair (including a wheelchair)?: A Lot Help needed standing up from a chair using your arms (e.g., wheelchair or bedside chair)?: A Lot Help needed to walk in hospital room?: Total Help needed climbing 3-5 steps with a railing? : Total 6 Click Score: 10    End of Session Equipment Utilized During Treatment: Gait belt Activity Tolerance: Patient tolerated treatment well Patient left: in bed;with call bell/phone within reach;with bed alarm set Nurse Communication: Mobility  status PT Visit Diagnosis: Unsteadiness on feet (R26.81);Muscle weakness (generalized) (M62.81);Difficulty in walking, not elsewhere classified (R26.2)    Time: 7564-3329 PT Time Calculation (min) (ACUTE ONLY): 28 min   Charges:   PT Evaluation $PT Eval Moderate Complexity: 1 Mod          Lewis Shock, PT, DPT Acute Rehabilitation Services Pager #: [phone] Office #: [phone]   [name] 07/04/2020, 1:05 PM

## 2020-07-04 NOTE — Procedures (Signed)
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 EEG to evaluate for seizure  Level of alertness: Awake  AEDs during EEG study: LEV  Technical aspects: This EEG study was done with scalp electrodes positioned according to the 10-20 International system of electrode placement. Electrical activity was acquired at a sampling rate of 500Hz  and reviewed with a high frequency filter of 70Hz  and a low frequency filter of 1Hz . EEG data were recorded continuously and digitally stored.   Description: EEG showed continuous generalized and maximal right temporal region 2-3Hz  with overriding 13-14Hz  generalized beta activity. Hyperventilation and photic stimulation were not performed.     ABNORMALITY -Continuous slow, generalized  IMPRESSION: This study is suggestive of severe diffuse encephalopathy, nonspecific etiology. No seizures or epileptiform discharges were seen throughout the recording.  Carren Blakley 

## 2020-07-04 NOTE — Progress Notes (Signed)
Bilateral Lower Ext. study completed.   See CVProc for preliminary results.   Mackenize Delgadillo, RDMS, RVT 

## 2020-07-04 NOTE — Evaluation (Signed)
Speech Language Pathology Evaluation Patient Details Name: Mark Oneal MRN: [id number] DOB: 1986/07/05 Today's Date: 07/04/2020 Time: 1445-1510 SLP Time Calculation (min) (ACUTE ONLY): 25 min  Problem List:  Patient Active Problem List   Diagnosis Date Noted  . Stroke (cerebrum) (HCC) 07/03/2020  . 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 Respiratory failure (HCC)   . Encephalopathy acute    Past Medical History:  Past Medical History:  Diagnosis Date  . History of stroke involving cerebellum 05/2019   Past Surgical History:  Past Surgical History:  Procedure Laterality Date  . RADIOLOGY WITH ANESTHESIA N/A 07/03/2020   Procedure: IR WITH ANESTHESIA;  Surgeon: Radiologist, Medication, MD;  Location: [facility] OR;  Service: Radiology;  Laterality: N/A;   HPI:  Mark Oneal is a 34 y.o. 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 Pt found to have occluded R ICA, MCA, and ACA requiring thrombectomy by IR. Pt also received tPA. ETT 10/18-10/19.   Assessment / Plan / Recommendation Clinical Impression  Pt presents with a moderate dysarthria c/b imprecise articulation from sensorimotor deficits noted on cranial nerve exam as well as mildly reduced vocal intensity. He also has significant L inattention and reduced awareness of deficits. He denies having any symptoms from his stroke, but when given cues and opportunities for functional learning, he acknowledges his speech changes and his LUE weakness. His comprehension seemed appropriate for very basic tasks, but further assessment of higher levels of cognition would likely be best done with the use of an interpreter, which pt declined today. He will benefit from SLP f/u for cognition and communication.     SLP Assessment  SLP Recommendation/Assessment: Patient needs continued Speech  [place] [facility] SLP Visit Diagnosis: Dysarthria and anarthria (R47.1);Cognitive communication deficit (R41.841)    Follow Up Recommendations  Inpatient Rehab    Frequency and Duration min 2x/week  2 weeks      SLP Evaluation Cognition  Overall Cognitive Status: Impaired/Different from baseline Arousal/Alertness: Lethargic Orientation Level: Oriented X4 Attention: Sustained Sustained Attention: Impaired Sustained Attention Impairment: Functional basic Awareness: Impaired Awareness Impairment: Emergent impairment;Intellectual impairment Problem Solving: Impaired Problem Solving Impairment: Functional basic Safety/Judgment: Impaired       Comprehension  Auditory Comprehension Overall Auditory Comprehension: Appears within functional limits for tasks assessed (basic commands and questions)    Expression Expression Primary Mode of Expression: Verbal Verbal Expression Overall Verbal Expression: Appears within functional limits for tasks assessed Written Expression Dominant Hand: Right   Oral / Motor  Oral Motor/Sensory Function Overall Oral Motor/Sensory Function: Moderate impairment Facial ROM: Reduced left;Suspected CN VII (facial) dysfunction Facial Symmetry: Abnormal symmetry left;Suspected CN VII (facial) dysfunction Facial Strength: Reduced left;Suspected CN VII (facial) dysfunction Facial Sensation: Reduced left;Suspected CN V (Trigeminal) dysfunction Lingual ROM: Within Functional Limits Lingual Symmetry: Within Functional Limits Motor Speech Overall Motor Speech: Impaired Respiration: Within functional limits Phonation: Low vocal intensity Resonance: Within functional limits Articulation: Impaired Level of Impairment: Word Intelligibility: Intelligibility reduced Word: 75-100% accurate Phrase: 50-74% accurate   GO                    Mahala Menghini., M.A. CCC-SLP Acute Rehabilitation Services Pager [phone] Office [phone]  07/04/2020,  3:36 PM

## 2020-07-04 NOTE — Progress Notes (Signed)
Inpatient Rehab Admissions Coordinator Note:   Per therapy recommendations, pt was screened for CIR candidacy by Estill Dooms, PT, DPT.  At this time we are recommending a CIR consult and I will place an order per our protocol.  Please contact me with questions.   Estill Dooms, PT, DPT [phone] 07/04/20 3:45 PM

## 2020-07-04 NOTE — Progress Notes (Signed)
STROKE TEAM PROGRESS NOTE   INTERVAL HISTORY RN at the bedside. Pt sleepy lethargic but open eye on voice and orientated x 3. Still has left facial droop and left UE flaccid, but left LE 3-4/5. CT repeat last night showed left BG/caudate head infarct with contrast stain, mild cerebral edema. MRI pending this noon time. He was put on 3% saline overnight, but he is doing well clinically and will d/c it for now. EEG no seizure. Will continue keppra.   Vitals:   07/04/20 0700 07/04/20 0752 07/04/20 0800 07/04/20 0900  BP: 135/69  (!) 146/79 128/68  Pulse: (!) 102  (!) 105 (!) 103  Resp: 15 20 16 15   Temp:   98.7 F (37.1 C)   TempSrc:   Axillary   SpO2: 100% 100% 100% 100%  Weight:      Height:       CBC:  Recent Labs  Lab 07/03/20 1344 07/03/20 1344 07/03/20 2047 07/04/20 0355  WBC 10.5  --   --  16.1*  NEUTROABS 6.3  --   --   --   HGB 15.6   < > 15.0 14.2  HCT 44.8   < > 44.0 40.3  MCV 90.9  --   --  91.0  PLT 238  --   --  222   < > = values in this interval not displayed.   Basic Metabolic Panel:  Recent Labs  Lab 07/03/20 1344 07/03/20 1344 07/03/20 2047 07/03/20 2047 07/04/20 0201 07/04/20 0355  NA 137   < > 137   < > 135 135  K 3.8   < > 4.3  --   --  4.0  CL 103  --   --   --   --  102  CO2 21*  --   --   --   --  21*  GLUCOSE 116*  --   --   --   --  139*  BUN 10  --   --   --   --  11  CREATININE 1.01  --   --   --   --  0.95  CALCIUM 9.4  --   --   --   --  8.4*   < > = values in this interval not displayed.   Lipid Panel:  Recent Labs  Lab 07/04/20 0358  CHOL 293*  TRIG 823*  854*  HDL 29*  CHOLHDL 10.1  VLDL UNABLE TO CALCULATE IF TRIGLYCERIDE OVER 400 mg/dL  LDLCALC UNABLE TO CALCULATE IF TRIGLYCERIDE OVER 400 mg/dL   07/06/20: No results for input(s): HGBA1C in the last 168 hours. Urine Drug Screen:  Recent Labs  Lab 07/03/20 1738  LABOPIA NONE DETECTED  COCAINSCRNUR NONE DETECTED  LABBENZ NONE DETECTED  AMPHETMU NONE DETECTED  THCU  NONE DETECTED  LABBARB NONE DETECTED    Alcohol Level  Recent Labs  Lab 07/03/20 1344  ETH <10    IMAGING past 24 hours CT HEAD WO CONTRAST  Result Date: 07/03/2020 CLINICAL DATA:  34 year old male code stroke presentation left side weakness this afternoon positive for right ICA occlusion in the neck, right M1 occlusion. Initial ASPECTS 8. Treated with endovascular revascularization 1500 hours today. EXAM: CT HEAD WITHOUT CONTRAST TECHNIQUE: Contiguous axial images were obtained from the base of the skull through the vertex without intravenous contrast. COMPARISON:  Presentation head CT 1350 hours. FINDINGS: Brain: Stable chronic appearing cerebellar infarcts, including a relatively large right SCA territory infarct. Contrast staining in  the posterior lentiform nuclei. Superimposed cytotoxic edema in the right caudate and anterior lentiform, new from the presentation head CT. Mild associated mass effect on the right lateral ventricle, especially the frontal horn. Similar cytotoxic edema at the right insula and external capsule, some of which was present initially. Cytotoxic edema also again suspected at the anterior temporal tip. No acute intracranial hemorrhage identified. ASPECTS MCA segments 2 through 6 appear to remain normal. Contralateral left hemisphere gray-white matter differentiation is stable. No midline shift. Normal basilar cisterns. Vascular: Small volume residual intravascular contrast. Skull: Negative. Sinuses/Orbits: Visualized paranasal sinuses and mastoids are stable and well pneumatized. Other: No acute orbit or scalp soft tissue finding. Intubated. Small volume retained secretions in the visible pharynx. IMPRESSION: 1. No acute intracranial hemorrhage identified. Dense contrast staining of the posterior right lentiform, where early cytotoxic edema was demonstrated on the presentation scan. 2. New and/or increased cytotoxic edema in the remaining right basal ganglia, right insula,  anterior temporal lobe. Mild mass effect on the right lateral ventricle with no midline shift. 3. Stable right greater than left cerebellar infarcts, likely chronic. Electronically Signed   By: Odessa Fleming M.D.   On: 07/03/2020 23:48   Portable Chest xray  Result Date: 07/04/2020 CLINICAL DATA:  Intubation. EXAM: PORTABLE CHEST 1 VIEW COMPARISON:  07/03/2020. FINDINGS: Endotracheal tube tip 3.7 cm above the carina. Mediastinum hilar structures appear stable. Heart size appears stable. Low lung volumes with bilateral subsegmental atelectasis. No pleural effusion or pneumothorax. IMPRESSION: 1. Endotracheal tube tip 3.7 cm above the carina. 2. Low lung volumes with bilateral subsegmental atelectasis. Electronically Signed   By: Maisie Fus  Register   On: 07/04/2020 06:50   Portable Chest x-ray  Result Date: 07/03/2020 CLINICAL DATA:  Respiratory failure. EXAM: PORTABLE CHEST 1 VIEW COMPARISON:  None. FINDINGS: An endotracheal tube is seen with its distal tip approximately 3.2 cm from the carina. There is no evidence of acute infiltrate, pleural effusion or pneumothorax. The heart size and mediastinal contours are within normal limits. The visualized skeletal structures are unremarkable. IMPRESSION: Endotracheal tube positioning, as described above, without evidence of acute or active cardiopulmonary disease. Electronically Signed   By: Aram Candela M.D.   On: 07/03/2020 17:50   EEG adult  Result Date: 07/04/2020 Charlsie Quest, MD     07/04/2020  9:55 AM Patient Name: Mark Oneal MRN: [id number] Epilepsy Attending: Charlsie Quest Referring Physician/Provider: Dr Marvel Plan Date: 07/04/2020 Duration: 24.35 mins Patient history: 34yo M with history of a prior R SCA stroke with minimal residual symptoms presented with slurred speech and jerking of extremities. EEG to evaluate for seizure Level of alertness: Awake AEDs during EEG study: LEV Technical aspects: This EEG study was done with scalp electrodes  positioned according to the 10-20 International system of electrode placement. Electrical activity was acquired at a sampling rate of 500Hz  and reviewed with a high frequency filter of 70Hz  and a low frequency filter of 1Hz . EEG data were recorded continuously and digitally stored. Description: EEG showed continuous generalized and maximal right temporal region 2-3Hz  with overriding 13-14Hz  generalized beta activity. Hyperventilation and photic stimulation were not performed.   ABNORMALITY -Continuous slow, generalized IMPRESSION: This study is suggestive of severe diffuse encephalopathy, nonspecific etiology. No seizures or epileptiform discharges were seen throughout the recording.   CT HEAD CODE STROKE WO CONTRAST  Result Date: 07/03/2020 CLINICAL DATA:  Code stroke.  Left-sided weakness EXAM: CT HEAD WITHOUT CONTRAST TECHNIQUE: Contiguous axial images were obtained from  the base of the skull through the vertex without intravenous contrast. COMPARISON:  None. FINDINGS: Brain: There is no acute intracranial hemorrhage. There is suspected hypoattenuation and loss of gray-white differentiation involving the anterior right temporal lobe and inferior lentiform nucleus. There is no mass effect. Ventricles and sulci are within normal limits in size and configuration. There are age-indeterminate but probably chronic right greater than left cerebellar infarcts. Vascular: There is increased density along the right M1 MCA. Skull: Unremarkable. Sinuses/Orbits: No acute abnormality. Other: Mastoid air cells are clear. ASPECTS [facility] Stroke Program Early CT Score) - Ganglionic level infarction (caudate, lentiform nuclei, internal capsule, insula, M1-M3 cortex): 5 - Supraganglionic infarction (M4-M6 cortex): 3 Total score (0-10 with 10 being normal): 8 IMPRESSION: There is no acute intracranial hemorrhage. Suspected thrombus within the right MCA and acute right MCA territory infarction. ASPECT score is  8. These results were called by telephone at the time of interpretation on 07/03/2020 at 1:54 pm to provider [facility] , who verbally acknowledged these results. Electronically Signed   By: Guadlupe Spanish M.D.   On: 07/03/2020 14:06   CT ANGIO HEAD CODE STROKE  Result Date: 07/03/2020 CLINICAL DATA:  Code stroke follow-up EXAM: CT ANGIOGRAPHY HEAD AND NECK TECHNIQUE: Multidetector CT imaging of the head and neck was performed using the standard protocol during bolus administration of intravenous contrast. Multiplanar CT image reconstructions and MIPs were obtained to evaluate the vascular anatomy. Carotid stenosis measurements (when applicable) are obtained utilizing NASCET criteria, using the distal internal carotid diameter as the denominator. CONTRAST:  60mL OMNIPAQUE IOHEXOL 350 MG/ML SOLN COMPARISON:  None. FINDINGS: CTA NECK FINDINGS Aortic arch: Great vessel origins are patent. Right carotid system: Common and external carotid arteries are patent. There is occlusion of the cervical ICA approximately 2 cm above the origin. No reconstitution in the neck. Left carotid system: Patent. Vertebral arteries: Patent.  Left vertebral artery is dominant. Skeleton: No acute abnormality. Other neck: No significant abnormality. Upper chest: Included upper lungs are clear. Review of the MIP images confirms the above findings CTA HEAD FINDINGS Anterior circulation: There is occlusion of the intracranial right ICA to the level of the PCOM origin where there is reconstitution. There is subsequent occlusion of the proximal right M1 MCA extending to the bifurcation where there is partial reconstitution. Left intracranial internal carotid, left middle cerebral artery, and both anterior cerebral arteries are patent. Mild calcified plaque along the left ICA. Posterior circulation: Intracranial vertebral arteries, basilar artery, and posterior cerebral arteries are patent. There are right larger than left posterior  communicating arteries. Venous sinuses: As permitted by contrast timing, patent. 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 Reconstitution intracranially by a posterior communicating artery. Subsequent occlusion of the proximal right M1 MCA extending to the bifurcation where there is partial reconstitution. These results were communicated to Dr. Derry Lory at 2:13 pm on 07/03/2020 by text page via the [facility] messaging system. Electronically Signed   By: Guadlupe Spanish M.D.   On: 07/03/2020 14:23   CT ANGIO NECK CODE STROKE  Result Date: 07/03/2020 CLINICAL DATA:  Code stroke follow-up EXAM: CT ANGIOGRAPHY HEAD AND NECK TECHNIQUE: Multidetector CT imaging of the head and neck was performed using the standard protocol during bolus administration of intravenous contrast. Multiplanar CT image reconstructions and MIPs were obtained to evaluate the vascular anatomy. Carotid stenosis measurements (when applicable) are obtained utilizing NASCET criteria, using the distal internal carotid  diameter as the denominator. CONTRAST:  60mL OMNIPAQUE IOHEXOL 350 MG/ML SOLN COMPARISON:  None. FINDINGS: CTA NECK FINDINGS Aortic arch: Great vessel origins are patent. Right carotid system: Common and external carotid arteries are patent. There is occlusion of the cervical ICA approximately 2 cm above the origin. No reconstitution in the neck. Left carotid system: Patent. Vertebral arteries: Patent.  Left vertebral artery is dominant. Skeleton: No acute abnormality. Other neck: No significant abnormality. Upper chest: Included upper lungs are clear. Review of the MIP images confirms the above findings CTA HEAD FINDINGS Anterior circulation: There is occlusion of the intracranial right ICA to the level of the PCOM origin where there is reconstitution. There is subsequent occlusion of the proximal right M1 MCA extending to the bifurcation where  there is partial reconstitution. Left intracranial internal carotid, left middle cerebral artery, and both anterior cerebral arteries are patent. Mild calcified plaque along the left ICA. Posterior circulation: Intracranial vertebral arteries, basilar artery, and posterior cerebral arteries are patent. There are right larger than left posterior communicating arteries. Venous sinuses: As permitted by contrast timing, patent. 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 Reconstitution intracranially by a posterior communicating artery. Subsequent occlusion of the proximal right M1 MCA extending to the bifurcation where there is partial reconstitution. These results were communicated to Dr. Derry Lory at 2:13 pm on 07/03/2020 by text page via the [facility] messaging system. Electronically Signed   By: Guadlupe Spanish M.D.   On: 07/03/2020 14:23    PHYSICAL EXAM  Temp:  [98 F (36.7 C)-98.7 F (37.1 C)] 98.7 F (37.1 C) (10/19 0800) Pulse Rate:  [79-115] 96 (10/19 1000) Resp:  [14-30] 17 (10/19 1000) BP: (105-149)/(56-95) 141/67 (10/19 1000) SpO2:  [100 %] 100 % (10/19 1000) Arterial Line BP: (79-185)/(59-97) 110/80 (10/19 1000) FiO2 (%):  [28 %-50 %] 28 % (10/19 0838) Weight:  [90.3 kg] 90.3 kg (10/18 1300)  General - Well nourished, well developed, sleepy lethargic.  Ophthalmologic - fundi not visualized due to noncooperation.  Cardiovascular - Regular rhythm and mild tachycardia 100s.  Neuro - sleepy lethargic but eyes open easily with voice. Orientated to place, time and age. Paucity of speech, able to name 2/3 and repeat in dysarthric voice. Mild to moderate dysarthria. Following simple commands. Visual field full, able to have bilateral gaze, but still has right gaze preference. Left facial droop, tongue protrusion to the left. Left UE flaccid, 0/5 proximal and distal. LLE at least 3/5 proximal and 4/5 knee  flexion, 4/5 PF and 3/5 DF. RUE and RLE 5/5. Sensation subjectively symmetrical. RUE FTN intact. Gait not tested.    ASSESSMENT/PLAN [name] is a 34 y.o. 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 Found to have L sided weakness w/ neglect and R gaze on arrival to ED. CTH w/ hyperdense R MCA w/ w/ cervical R ICA occlusion and ASPECTS 8. Received IV tPA 07/03/2020 at 1356 and sent to IR.  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 Suspect R MCA thrombus w/ acute infarct. ASPECTS 8.    CTA head & neck cervical R ICA occlusion 2cm above origin  w/ reconstitution and subsequent occlusion proximal R M1 extending into bifurcation   Cerebral angio / IR extracranial R ICA/terminus, R MCA, R MCA occlusion w/ TICI3 revascularization. Possible R ICA dissection  Repeat CT head dense contrast staining posterior R lentiform (no hemorrhage). New/increased edema R basal ganglia, R insula, anterior temporal lobe w/ mild mass effect R lateral ventricle. 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  MRI right MCA multifocal infarcts  LE Doppler no DVT  2D Echo EF 60-65%  TG 823, direct LDL 151   HgbA1c pending   Hypercoagulable labs pending    VTE prophylaxis - SCDs   aspirin 81 mg daily prior to admission, now on ASA 300 PR  Therapy recommendations:  pending   Disposition:  pending   Seizure  Witnessed at stroke onset  Seizure in CT  Treated w/ ativan  Loaded w/ Keppra 1500  Now on Keppra 500 q 12h  EEG no sz, slowing   Acute Respiratory Failure  Secondary to stroke  Intubated for IR, left intubated post IR per anesthesia  Extubated 10/19 w/o difficulty  CCM on board   Hypertension  Home meds:  None  listed, no documented hx  Off Cleviprex drip  Stable . Long-term BP goal normotensive  Hyperlipidemia  Home meds:  No statin  LDL TG 823 and TC. Direct LDL 151, goal < 70  Will add lipitor 80 once po access    Continue statin at discharge  Dysphagia   . Secondary to stroke . Did not pass swallow . NPO . Speech on board   Other Stroke Risk Factors  Obesity, Body mass index is 34.17 kg/m., recommend weight loss, diet and exercise as appropriate   Hx stroke/TIA  05/2019 - R cerebellar stroke in [place]   Other Active Problems  Leukocytosis WBC 16.1  Hospital day # 1  This patient is critically ill due to right MCA stroke, right ICA and MCA occlusion, history of stroke, seizure on presentation, respiratory failure and at significant risk of neurological worsening, death form recurrent stroke, hemorrhagic conversion, status epilepticus, aspiration pneumonia. This patient's care requires constant monitoring of vital signs, hemodynamics, respiratory and cardiac monitoring, review of multiple databases, neurological assessment, discussion with family, other specialists and medical decision making of high complexity. I spent 40 minutes of neurocritical care time in the care of this patient.  Marvel PlanJindong Hershel Corkery, MD PhD Stroke Neurology 07/04/2020 5:51 PM    To contact Stroke Continuity provider, please refer to [email or web address]. After hours, contact General Neurology

## 2020-07-04 NOTE — Evaluation (Signed)
Clinical/Bedside Swallow Evaluation Patient Details  Name: Mark Oneal MRN: [id number] Date of Birth: Feb 03, 1986  Today's Date: 07/04/2020 Time: SLP Start Time (ACUTE ONLY): 1427 SLP Stop Time (ACUTE ONLY): 1445 SLP Time Calculation (min) (ACUTE ONLY): 18 min  Past Medical History:  Past Medical History:  Diagnosis Date  . History of stroke involving cerebellum 05/2019   Past Surgical History:  Past Surgical History:  Procedure Laterality Date  . RADIOLOGY WITH ANESTHESIA N/A 07/03/2020   Procedure: IR WITH ANESTHESIA;  Surgeon: Radiologist, Medication, MD;  Location: [facility] OR;  Service: Radiology;  Laterality: N/A;   HPI:  Mark Oneal is a 34 y.o. 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 Pt found to have occluded R ICA, MCA, and ACA requiring thrombectomy by IR. Pt also received tPA. ETT 10/18-10/19.   Assessment / Plan / Recommendation Clinical Impression  Pt is eager to participate despite being drowsy. He has reduced strength and sensation on the L side of his face, resulting in L anterior spillage and pocketing. Pt tries to manage this more when cued to do so, but otherwise only attends to the R side of his mouth. He has intermittent coughing with thin liquids, not noticed with purees but he does have delayed throat clearing. For today would allow meds crushed in puree or a few pieces of ice after oral care when alert. He will likely need an MBS. Anticipate that he may be ready for this as soon as tomorrow.   SLP Visit Diagnosis: Dysphagia, unspecified (R13.10)    Aspiration Risk  Moderate aspiration risk    Diet Recommendation NPO except meds;Ice chips PRN after oral care   Medication Administration: Crushed with puree    Other  Recommendations Oral Care Recommendations: Oral care QID Other Recommendations: Have oral suction  available   Follow up Recommendations Inpatient Rehab      Frequency and Duration min 2x/week  2 weeks       Prognosis Prognosis for Safe Diet Advancement: Good Barriers to Reach Goals: Cognitive deficits      Swallow Study   General HPI: Mark Oneal is a 34 y.o. 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 Pt found to have occluded R ICA, MCA, and ACA requiring thrombectomy by IR. Pt also received tPA. ETT 10/18-10/19. Type of Study: Bedside Swallow Evaluation Previous Swallow Assessment: none in chart Diet Prior to this Study: NPO Temperature Spikes Noted: No Respiratory Status: Nasal cannula History of Recent Intubation: Yes Length of Intubations (days): 1 days Date extubated: 07/04/20 Behavior/Cognition: Lethargic/Drowsy;Cooperative Oral Cavity Assessment: Within Functional Limits Oral Care Completed by SLP: No Oral Cavity - Dentition: Adequate natural dentition Vision: Impaired for self-feeding Self-Feeding Abilities: Needs assist Patient Positioning: Upright in bed Baseline Vocal Quality: Low vocal intensity Volitional Cough: Weak Volitional Swallow: Able to elicit    Oral/Motor/Sensory Function Overall Oral Motor/Sensory Function: Moderate impairment Facial ROM: Reduced left;Suspected CN VII (facial) dysfunction Facial Symmetry: Abnormal symmetry left;Suspected CN VII (facial) dysfunction Facial Strength: Reduced left;Suspected CN VII (facial) dysfunction Facial Sensation: Reduced left;Suspected CN V (Trigeminal) dysfunction Lingual ROM: Within Functional Limits Lingual Symmetry: Within Functional Limits   Ice Chips Ice chips: Within functional limits Presentation: Spoon  Thin Liquid Thin Liquid: Impaired Presentation: Cup;Self Fed;Spoon;Straw Oral Phase Impairments: Reduced labial seal Oral Phase Functional  Implications: Left anterior spillage Pharyngeal  Phase Impairments: Cough - Immediate;Cough - Delayed;Throat Clearing - Delayed    Nectar Thick Nectar Thick Liquid: Not tested   Honey Thick Honey Thick Liquid: Not tested   Puree Puree: Impaired Presentation: Self Fed;Spoon Oral Phase Impairments: Reduced labial seal;Poor awareness of bolus Oral Phase Functional Implications: Left anterior spillage;Left lateral sulci pocketing;Prolonged oral transit Pharyngeal Phase Impairments: Throat Clearing - Delayed   Solid     Solid: Not tested      Mahala Menghini., M.A. CCC-SLP Acute Rehabilitation Services Pager [phone] Office [phone]  07/04/2020,3:21 PM

## 2020-07-04 NOTE — Plan of Care (Signed)
Pt extubated this morning. Resp e/u, SpO2 100% on RA. Able to communicate needs with staff and participate in some simple self-care tasks.

## 2020-07-04 NOTE — Progress Notes (Signed)
NAME:  Selma Rodelo, MRN:  [id number], DOB:  October 25, 1985, LOS: 1 ADMISSION DATE:  07/03/2020, CONSULTATION DATE:  07/03/20 REFERRING MD:  Corliss Skains  CHIEF COMPLAINT:  ICA occlusion s/p revascularization   Brief History   Bereket Gernert is a 34 y.o. male who was admitted 10/18 with right ICA, right MCA, right ACA occlusion.  He received tPA and was taken for IR revascularization.  History of present illness   Pt is encephelopathic; therefore, this HPI is obtained from chart review. Lakendrick Paradis is a 34 y.o. male who has a PMH including but not limited to prior CVA per neuro H&P (? years ago in [place]).  He presented to [facility] ED 10/18 as code stroke.  He was at a supermarket when he was noted to have seizure like activity followed by left sided weakness.    CTA demonstrated R ICA and R MCA occlusions.  He received tPA at 1356.  He was then taken to IR for revascularization.  He had complete revascularization of occluded R ICA, R MCA, R ACA, and thereafter, was transferred to the neuro ICU for ongoing care.  PCCM was asked to assist with vent management.  Past Medical History  has Stroke (cerebrum) (HCC); Middle cerebral artery embolism, right; Respiratory failure (HCC); and Encephalopathy acute on their problem list.  Significant Hospital Events   10/18 > Admit.  Consults:  IR, PCCM.  Procedures:  ETT 10/18 >   Significant Diagnostic Tests:  CT head 10/18 > no acute hemorrhage.  Suspected thrombus in right MCA. CTA head 10/18 > occlusion of right ICA with subsequent occlusion of proximal right M1 MCA. CT head 10/19 > no acute hemorrhage. New / increased cytotoxic edema in right basal ganglia, right insula, anterior temporal lobe with mild mass effect. MRI brain 10/19 >  Echo 10/19 >   Micro Data:  COVID 10/18 > neg. Flu 10/18 > neg.  Antimicrobials:  None.   Interim history/subjective:  Follows commands on 30 propofol. Planning for extubation shortly.  Objective:  Blood pressure  135/69, pulse (!) 102, temperature 98.7 F (37.1 C), temperature source Axillary, resp. rate 20, height 5\' 4"  (1.626 m), weight 90.3 kg, SpO2 100 %.    Vent Mode: PRVC FiO2 (%):  [40 %-50 %] 40 % Set Rate:  [15 bmp] 15 bmp Vt Set:  [470 mL] 470 mL PEEP:  [5 cmH20] 5 cmH20 Plateau Pressure:  [10 cmH20-28 cmH20] 10 cmH20   Intake/Output Summary (Last 24 hours) at 07/04/2020 0818 Last data filed at 07/04/2020 07/06/2020 Gross per 24 hour  Intake 2323.53 ml  Output 775 ml  Net 1548.53 ml   Filed Weights   07/03/20 1300  Weight: 90.3 kg    Examination: General: Adult male, in NAD. Neuro: Sedated but able to follow basic commands. HEENT: [place]/AT. Sclerae anicteric.  ETT in place. Cardiovascular: RRR, no M/R/G.  Lungs: Respirations even and unlabored.  CTA bilaterally, No W/R/R.   Abdomen: BS x 4, soft, NT/ND.  Musculoskeletal: No gross deformities, no edema.  Skin: Intact, warm, no rashes.  Assessment & Plan:   R ICA, R MCA, R ACA occlusion - s/p tPA followed by IR revascularization. - Post procedure management per IR. - Stroke workup / management per neuro. - F/u on repeat CT head, MRI brain, echo.   Hypertension. - Cleviprex PRN for goal SBP 120 - 140 per IR.  Should be able to stop soon after extubation.  Respiratory insufficiency - due to inability to protect the airway in  the setting of above. - Continue PSV now as sedation wears off. - Extubate in next hour.   Best Practice:  Diet: SLP eval after extubation. Pain/Anxiety/Delirium protocol (if indicated): D/c. VAP protocol (if indicated): D/c. DVT prophylaxis: SCD's only. GI prophylaxis: D/c PPI. Glucose control: SSI if glucose consistently > 180. Mobility: Bedrest. Code Status: Full. Family Communication: Per primary. Disposition: ICU.    Critical care time: 30 min.    Rutherford Guys, [place] [facility] 07/04/2020, 8:18 AM

## 2020-07-04 NOTE — Progress Notes (Signed)
  Echocardiogram 2D Echocardiogram has been performed.  Mark Oneal 07/04/2020, 11:32 AM

## 2020-07-04 NOTE — Anesthesia Postprocedure Evaluation (Signed)
Anesthesia Post Note  Patient: Mark Oneal  Procedure(s) Performed: IR WITH ANESTHESIA (N/A )     Patient location during evaluation: PACU Anesthesia Type: General Level of consciousness: awake Pain management: pain level controlled Vital Signs Assessment: post-procedure vital signs reviewed and stable Respiratory status: spontaneous breathing Cardiovascular status: stable Postop Assessment: no apparent nausea or vomiting Anesthetic complications: no   No complications documented.  Last Vitals:  Vitals:   07/04/20 1400 07/04/20 1430  BP: 128/63 119/64  Pulse:  88  Resp:  18  Temp:    SpO2:  99%    Last Pain:  Vitals:   07/04/20 1200  TempSrc: Oral  PainSc: 0-No pain                 Khori Underberg

## 2020-07-04 NOTE — Progress Notes (Addendum)
Referring Physician(s): Code stroke- Erick Blinks (neurology)  Supervising Physician: Julieanne Cotton  Patient Status:  [facility] - In-pt  Chief Complaint: None  Subjective:  History of acute CVA s/p cerebral arteriogram with emergent mechanical thrombectomy of right ICA (extracranially and terminus), right MCA, and right ACA occlusions achieving a TICI 3 revascularization via right femoral approach 07/03/2020 by Dr. Corliss Skains. Patient awake and alert laying in bed. He follows simple commands, speaks [place] fairly well. Can spontaneously move right side and LLE with weakness of LLE, no movements of LUE. Right femoral puncture site c/d/i.   Allergies: Patient has no known allergies.  Medications: Prior to Admission medications   Medication Sig Start Date End Date Taking? Authorizing Provider  aspirin EC 81 MG tablet Take 81 mg by mouth daily. Swallow whole.   Yes [provider]     Vital Signs: BP 128/68   Pulse (!) 103   Temp 98.7 F (37.1 C) (Axillary)   Resp 15   Ht 5\' 4"  (1.626 m)   Wt 199 lb 1.2 oz (90.3 kg)   SpO2 100%   BMI 34.17 kg/m   Physical Exam Vitals and nursing note reviewed.  Constitutional:      General: He is not in acute distress. Pulmonary:     Effort: Pulmonary effort is normal. No respiratory distress.  Skin:    General: Skin is warm and dry.     Comments: Right femoral puncture site soft without active bleeding or hematoma.  Neurological:     Mental Status: He is alert.     Comments: Alert, awake, and oriented x3. He follows simple commands. Speech dysarthric. PERRL bilaterally. EOMs intact bilaterally without nystagmus or subjective diplopia. Tongue midline. Can spontaneously move right side and LLE with weakness of LLE, no movements of LUE. LLE pronator drift. Distal pulses (DPs) 2+ bilaterally.     Imaging: CT HEAD WO CONTRAST  Result Date: 07/03/2020 CLINICAL DATA:  34 year old male code stroke presentation  left side weakness this afternoon positive for right ICA occlusion in the neck, right M1 occlusion. Initial ASPECTS 8. Treated with endovascular revascularization 1500 hours today. EXAM: CT HEAD WITHOUT CONTRAST TECHNIQUE: Contiguous axial images were obtained from the base of the skull through the vertex without intravenous contrast. COMPARISON:  Presentation head CT 1350 hours. FINDINGS: Brain: Stable chronic appearing cerebellar infarcts, including a relatively large right SCA territory infarct. Contrast staining in the posterior lentiform nuclei. Superimposed cytotoxic edema in the right caudate and anterior lentiform, new from the presentation head CT. Mild associated mass effect on the right lateral ventricle, especially the frontal horn. Similar cytotoxic edema at the right insula and external capsule, some of which was present initially. Cytotoxic edema also again suspected at the anterior temporal tip. No acute intracranial hemorrhage identified. ASPECTS MCA segments 2 through 6 appear to remain normal. Contralateral left hemisphere gray-white matter differentiation is stable. No midline shift. Normal basilar cisterns. Vascular: Small volume residual intravascular contrast. Skull: Negative. Sinuses/Orbits: Visualized paranasal sinuses and mastoids are stable and well pneumatized. Other: No acute orbit or scalp soft tissue finding. Intubated. Small volume retained secretions in the visible pharynx. IMPRESSION: 1. No acute intracranial hemorrhage identified. Dense contrast staining of the posterior right lentiform, where early cytotoxic edema was demonstrated on the presentation scan. 2. New and/or increased cytotoxic edema in the remaining right basal ganglia, right insula, anterior temporal lobe. Mild mass effect on the right lateral ventricle with no midline shift. 3. Stable right greater than left cerebellar  infarcts, likely chronic. Electronically Signed   By: Odessa Fleming M.D.   On: 07/03/2020 23:48    Portable Chest xray  Result Date: 07/04/2020 CLINICAL DATA:  Intubation. EXAM: PORTABLE CHEST 1 VIEW COMPARISON:  07/03/2020. FINDINGS: Endotracheal tube tip 3.7 cm above the carina. Mediastinum hilar structures appear stable. Heart size appears stable. Low lung volumes with bilateral subsegmental atelectasis. No pleural effusion or pneumothorax. IMPRESSION: 1. Endotracheal tube tip 3.7 cm above the carina. 2. Low lung volumes with bilateral subsegmental atelectasis. Electronically Signed   By: Maisie Fus  Register   On: 07/04/2020 06:50   Portable Chest x-ray  Result Date: 07/03/2020 CLINICAL DATA:  Respiratory failure. EXAM: PORTABLE CHEST 1 VIEW COMPARISON:  None. FINDINGS: An endotracheal tube is seen with its distal tip approximately 3.2 cm from the carina. There is no evidence of acute infiltrate, pleural effusion or pneumothorax. The heart size and mediastinal contours are within normal limits. The visualized skeletal structures are unremarkable. IMPRESSION: Endotracheal tube positioning, as described above, without evidence of acute or active cardiopulmonary disease. Electronically Signed   By: Aram Candela M.D.   On: 07/03/2020 17:50   EEG adult  Result Date: 07/04/2020 Charlsie Quest, MD     07/04/2020  9:55 AM Patient Name: Brentin Shin MRN: [id number] Epilepsy Attending: Charlsie Quest Referring Physician/Provider: Dr Marvel Plan Date: 07/04/2020 Duration: 24.35 mins Patient history: 34yo M with history of a prior R SCA stroke with minimal residual symptoms presented with slurred speech and jerking of extremities. EEG to evaluate for seizure Level of alertness: Awake AEDs during EEG study: LEV Technical aspects: This EEG study was done with scalp electrodes positioned according to the 10-20 International system of electrode placement. Electrical activity was acquired at a sampling rate of 500Hz  and reviewed with a high frequency filter of 70Hz  and a low frequency filter of 1Hz . EEG  data were recorded continuously and digitally stored. Description: EEG showed continuous generalized and maximal right temporal region 2-3Hz  with overriding 13-14Hz  generalized beta activity. Hyperventilation and photic stimulation were not performed.   ABNORMALITY -Continuous slow, generalized IMPRESSION: This study is suggestive of severe diffuse encephalopathy, nonspecific etiology. No seizures or epileptiform discharges were seen throughout the recording.   CT HEAD CODE STROKE WO CONTRAST  Result Date: 07/03/2020 CLINICAL DATA:  Code stroke.  Left-sided weakness EXAM: CT HEAD WITHOUT CONTRAST TECHNIQUE: Contiguous axial images were obtained from the base of the skull through the vertex without intravenous contrast. COMPARISON:  None. FINDINGS: Brain: There is no acute intracranial hemorrhage. There is suspected hypoattenuation and loss of gray-white differentiation involving the anterior right temporal lobe and inferior lentiform nucleus. There is no mass effect. Ventricles and sulci are within normal limits in size and configuration. There are age-indeterminate but probably chronic right greater than left cerebellar infarcts. Vascular: There is increased density along the right M1 MCA. Skull: Unremarkable. Sinuses/Orbits: No acute abnormality. Other: Mastoid air cells are clear. ASPECTS [facility] Stroke Program Early CT Score) - Ganglionic level infarction (caudate, lentiform nuclei, internal capsule, insula, M1-M3 cortex): 5 - Supraganglionic infarction (M4-M6 cortex): 3 Total score (0-10 with 10 being normal): 8 IMPRESSION: There is no acute intracranial hemorrhage. Suspected thrombus within the right MCA and acute right MCA territory infarction. ASPECT score is 8. These results were called by telephone at the time of interpretation on 07/03/2020 at 1:54 pm to provider [facility] , who verbally acknowledged these results. Electronically Signed   By: [facility] M.D.   On:  07/03/2020 14:06   CT ANGIO HEAD CODE STROKE  Result Date: 07/03/2020 CLINICAL DATA:  Code stroke follow-up EXAM: CT ANGIOGRAPHY HEAD AND NECK TECHNIQUE: Multidetector CT imaging of the head and neck was performed using the standard protocol during bolus administration of intravenous contrast. Multiplanar CT image reconstructions and MIPs were obtained to evaluate the vascular anatomy. Carotid stenosis measurements (when applicable) are obtained utilizing NASCET criteria, using the distal internal carotid diameter as the denominator. CONTRAST:  8mL OMNIPAQUE IOHEXOL 350 MG/ML SOLN COMPARISON:  None. FINDINGS: CTA NECK FINDINGS Aortic arch: Great vessel origins are patent. Right carotid system: Common and external carotid arteries are patent. There is occlusion of the cervical ICA approximately 2 cm above the origin. No reconstitution in the neck. Left carotid system: Patent. Vertebral arteries: Patent.  Left vertebral artery is dominant. Skeleton: No acute abnormality. Other neck: No significant abnormality. Upper chest: Included upper lungs are clear. Review of the MIP images confirms the above findings CTA HEAD FINDINGS Anterior circulation: There is occlusion of the intracranial right ICA to the level of the PCOM origin where there is reconstitution. There is subsequent occlusion of the proximal right M1 MCA extending to the bifurcation where there is partial reconstitution. Left intracranial internal carotid, left middle cerebral artery, and both anterior cerebral arteries are patent. Mild calcified plaque along the left ICA. Posterior circulation: Intracranial vertebral arteries, basilar artery, and posterior cerebral arteries are patent. There are right larger than left posterior communicating arteries. Venous sinuses: As permitted by contrast timing, patent. 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  Reconstitution intracranially by a posterior communicating artery. Subsequent occlusion of the proximal right M1 MCA extending to the bifurcation where there is partial reconstitution. These results were communicated to Dr. Derry Lory at 2:13 pm on 07/03/2020 by text page via the [facility] messaging system. Electronically Signed   By: Guadlupe Spanish M.D.   On: 07/03/2020 14:23   CT ANGIO NECK CODE STROKE  Result Date: 07/03/2020 CLINICAL DATA:  Code stroke follow-up EXAM: CT ANGIOGRAPHY HEAD AND NECK TECHNIQUE: Multidetector CT imaging of the head and neck was performed using the standard protocol during bolus administration of intravenous contrast. Multiplanar CT image reconstructions and MIPs were obtained to evaluate the vascular anatomy. Carotid stenosis measurements (when applicable) are obtained utilizing NASCET criteria, using the distal internal carotid diameter as the denominator. CONTRAST:  73mL OMNIPAQUE IOHEXOL 350 MG/ML SOLN COMPARISON:  None. FINDINGS: CTA NECK FINDINGS Aortic arch: Great vessel origins are patent. Right carotid system: Common and external carotid arteries are patent. There is occlusion of the cervical ICA approximately 2 cm above the origin. No reconstitution in the neck. Left carotid system: Patent. Vertebral arteries: Patent.  Left vertebral artery is dominant. Skeleton: No acute abnormality. Other neck: No significant abnormality. Upper chest: Included upper lungs are clear. Review of the MIP images confirms the above findings CTA HEAD FINDINGS Anterior circulation: There is occlusion of the intracranial right ICA to the level of the PCOM origin where there is reconstitution. There is subsequent occlusion of the proximal right M1 MCA extending to the bifurcation where there is partial reconstitution. Left intracranial internal carotid, left middle cerebral artery, and both anterior cerebral arteries are patent. Mild calcified plaque along the left ICA. Posterior circulation:  Intracranial vertebral arteries, basilar artery, and posterior cerebral arteries are patent. There are right larger than left posterior communicating arteries. Venous sinuses: As permitted by contrast timing,  patent. 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 Reconstitution intracranially by a posterior communicating artery. Subsequent occlusion of the proximal right M1 MCA extending to the bifurcation where there is partial reconstitution. These results were communicated to Dr. Derry LoryKhaliqdina at 2:13 pm on 07/03/2020 by text page via the [facility] messaging system. Electronically Signed   By: Guadlupe SpanishPraneil  Patel M.D.   On: 07/03/2020 14:23    Labs:  CBC: Recent Labs    07/03/20 1340 07/03/20 1344 07/03/20 2047 07/04/20 0355  WBC  --  10.5  --  16.1*  HGB 15.3 15.6 15.0 14.2  HCT 45.0 44.8 44.0 40.3  PLT  --  238  --  222    COAGS: Recent Labs    07/03/20 1344  INR 1.0  APTT 26    BMP: Recent Labs    07/03/20 1340 07/03/20 1340 07/03/20 1344 07/03/20 2047 07/04/20 0201 07/04/20 0355  NA 139   < > 137 137 135 135  K 3.8  --  3.8 4.3  --  4.0  CL 105  --  103  --   --  102  CO2  --   --  21*  --   --  21*  GLUCOSE 108*  --  116*  --   --  139*  BUN 12  --  10  --   --  11  CALCIUM  --   --  9.4  --   --  8.4*  CREATININE 0.80  --  1.01  --   --  0.95  GFRNONAA  --   --  >60  --   --  >60   < > = values in this interval not displayed.    LIVER FUNCTION TESTS: Recent Labs    07/03/20 1344  BILITOT 0.7  AST 39  ALT 36  ALKPHOS 86  PROT 7.4  ALBUMIN 4.4    Assessment and Plan:  History of acute CVA s/p cerebral arteriogram with emergent mechanical thrombectomy of right ICA (extracranially and terminus), right MCA, and right ACA occlusions achieving a TICI 3 revascularization via right femoral approach 07/03/2020 by Dr. Corliss Skainseveshwar. Patient's condition improving- awake and alert, follows  simple commands, moving right side and LLE with weakness of LLE, no movements of LUE. Right femoral puncture site stable, distal pulses (DPs) 2+ bilaterally. Further plans per neurology/CCM- appreciate and agree with management. NIR to follow.   Electronically Signed: Elwin MochaAlexandra Particia Strahm, PA-C 07/04/2020, 10:08 AM   I spent a total of 25 Minutes at the the patient's bedside AND on the patient's hospital floor or unit, greater than 50% of which was counseling/coordinating care for CVA s/p revascularization.

## 2020-07-05 ENCOUNTER — Inpatient Hospital Stay (HOSPITAL_COMMUNITY): Payer: Medicaid Other

## 2020-07-05 DIAGNOSIS — I63031 Cerebral infarction due to thrombosis of right carotid artery: Secondary | ICD-10-CM

## 2020-07-05 DIAGNOSIS — R1312 Dysphagia, oropharyngeal phase: Secondary | ICD-10-CM

## 2020-07-05 DIAGNOSIS — D62 Acute posthemorrhagic anemia: Secondary | ICD-10-CM

## 2020-07-05 DIAGNOSIS — I6930 Unspecified sequelae of cerebral infarction: Secondary | ICD-10-CM

## 2020-07-05 LAB — CBC
HCT: 37.2 % — ABNORMAL LOW (ref 39.0–52.0)
Hemoglobin: 12.6 g/dL — ABNORMAL LOW (ref 13.0–17.0)
MCH: 31.4 pg (ref 26.0–34.0)
MCHC: 33.9 g/dL (ref 30.0–36.0)
MCV: 92.8 fL (ref 80.0–100.0)
Platelets: 162 10*3/uL (ref 150–400)
RBC: 4.01 MIL/uL — ABNORMAL LOW (ref 4.22–5.81)
RDW: 11.9 % (ref 11.5–15.5)
WBC: 9.2 10*3/uL (ref 4.0–10.5)
nRBC: 0 % (ref 0.0–0.2)

## 2020-07-05 LAB — HEMOGLOBIN A1C
Hgb A1c MFr Bld: 5.6 % (ref 4.8–5.6)
Mean Plasma Glucose: 114 mg/dL

## 2020-07-05 LAB — BASIC METABOLIC PANEL
Anion gap: 9 (ref 5–15)
BUN: 14 mg/dL (ref 6–20)
CO2: 25 mmol/L (ref 22–32)
Calcium: 8.5 mg/dL — ABNORMAL LOW (ref 8.9–10.3)
Chloride: 106 mmol/L (ref 98–111)
Creatinine, Ser: 0.93 mg/dL (ref 0.61–1.24)
GFR, Estimated: >60 mL/min (ref >60)
Glucose, Bld: 97 mg/dL (ref 70–99)
Potassium: 3.8 mmol/L (ref 3.5–5.1)
Sodium: 140 mmol/L (ref 135–145)

## 2020-07-05 LAB — BETA-2-GLYCOPROTEIN I ABS, IGG/M/A
Beta-2 Glyco I IgG: <9 GPI IgG units (ref 0–20)
Beta-2-Glycoprotein I IgA: <9 GPI IgA units (ref 0–25)
Beta-2-Glycoprotein I IgM: <9 GPI IgM units (ref 0–32)

## 2020-07-05 LAB — HOMOCYSTEINE: Homocysteine: 3.9 umol/L (ref 0.0–14.5)

## 2020-07-05 LAB — ANTINUCLEAR ANTIBODIES, IFA: ANA Ab, IFA: NEGATIVE

## 2020-07-05 MED ORDER — ASPIRIN EC 81 MG PO TBEC
81.0000 mg | DELAYED_RELEASE_TABLET | Freq: Every day | ORAL | Status: DC
  Filled 2020-07-05: qty 1

## 2020-07-05 MED ORDER — PANTOPRAZOLE SODIUM 40 MG PO TBEC: 40.0000 mg | DELAYED_RELEASE_TABLET | Freq: Every day | ORAL | Status: DC

## 2020-07-05 MED ORDER — LABETALOL HCL 5 MG/ML IV SOLN: 5.0000 mg | INTRAVENOUS | Status: DC | PRN

## 2020-07-05 MED ORDER — ASPIRIN 81 MG PO CHEW
81.0000 mg | CHEWABLE_TABLET | Freq: Every day | ORAL | Status: DC
  Administered 2020-07-05 – 2020-07-12 (×8): 81 mg via ORAL
  Filled 2020-07-05 (×8): qty 1

## 2020-07-05 MED ORDER — PANTOPRAZOLE SODIUM 40 MG IV SOLR
40.0000 mg | Freq: Every day | INTRAVENOUS | Status: DC
  Administered 2020-07-05 – 2020-07-12 (×7): 40 mg via INTRAVENOUS
  Filled 2020-07-05 (×8): qty 40

## 2020-07-05 MED ORDER — ENOXAPARIN SODIUM 40 MG/0.4ML ~~LOC~~ SOLN
40.0000 mg | Freq: Every day | SUBCUTANEOUS | Status: DC
  Administered 2020-07-05 – 2020-07-12 (×8): 40 mg via SUBCUTANEOUS
  Filled 2020-07-05 (×8): qty 0.4

## 2020-07-05 MED ORDER — ATORVASTATIN CALCIUM 80 MG PO TABS
80.0000 mg | ORAL_TABLET | Freq: Every day | ORAL | Status: DC
  Administered 2020-07-05 – 2020-07-12 (×8): 80 mg via ORAL
  Filled 2020-07-05 (×8): qty 1

## 2020-07-05 MED ORDER — LEVETIRACETAM 500 MG PO TABS
500.0000 mg | ORAL_TABLET | Freq: Two times a day (BID) | ORAL | Status: DC
  Administered 2020-07-05 – 2020-07-12 (×14): 500 mg via ORAL
  Filled 2020-07-05 (×15): qty 1

## 2020-07-05 MED ORDER — RESOURCE THICKENUP CLEAR PO POWD
ORAL | Status: DC | PRN
  Filled 2020-07-05: qty 125

## 2020-07-05 MED ORDER — CLOPIDOGREL BISULFATE 75 MG PO TABS
75.0000 mg | ORAL_TABLET | Freq: Every day | ORAL | Status: DC
  Administered 2020-07-05 – 2020-07-12 (×8): 75 mg via ORAL
  Filled 2020-07-05 (×8): qty 1

## 2020-07-05 NOTE — Progress Notes (Signed)
° °  [facility] has been requested to perform a transesophageal echocardiogram on Mark Oneal for stroke.  After careful review of history and examination, the risks and benefits of transesophageal echocardiogram have been explained including risks of esophageal damage, perforation (1:10,000 risk), bleeding, pharyngeal hematoma as well as other potential complications associated with conscious sedation including aspiration, arrhythmia, respiratory failure and death. Alternatives to treatment were discussed, questions were answered. Patient is willing to proceed.   Procedure scheduled for 07/06/2020 at 7:30am with Dr. Bjorn Pippin. Will place orders.   Of note, patient primary language is Spanish but he does speak [place] fairly well. I offered to use a Spanish interpret multiple times but he declined. He still has some mild slurred speech from stroke but I could still understand him. He is fully oriented and seemed to understand our discussion.   Corrin Parker, PA-C 07/05/2020 7:44 PM

## 2020-07-05 NOTE — TOC Initial Note (Signed)
Transition of Care [facility]) - Initial/Assessment Note    Patient Details  Name: Mark Oneal MRN: [id number] Date of Birth: 1986-09-10  Transition of Care [facility]) CM/SW Contact:    Glennon Mac, RN Phone Number: 07/05/2020, 4:52 PM  Clinical Narrative: Mark Oneal is a 34 y.o. 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 Pt found to have occluded R ICA, MCA, and ACA requiring thrombectomy by IR. Pt also received tPA.     PTA, pt independent, lives with spouse and children.  PT/OT recommending CIR, and consult in progress.  Spouse able to provide care at discharge.  Spoke with pt's aunt, who states that pt's mother is coming from [place] soon (trip planned for December); prepared letter for her to take to [place] [employer] to request emergency VISA to come sooner to help care for her son.  Emailed letter to pt's aunt at [email or web address].                 Expected Discharge Plan: IP Rehab Facility Barriers to Discharge: Continued Medical Work up   Patient Goals and CMS Choice   CMS Medicare.gov Compare Post Acute Care list provided to:: Patient    Expected Discharge Plan and Services Expected Discharge Plan: IP Rehab Facility   Discharge Planning Services: CM Consult Post Acute Care Choice: IP Rehab Living arrangements for the past 2 months: Single Family Home                                      Prior Living Arrangements/Services Living arrangements for the past 2 months: Single Family Home Lives with:: Spouse Patient language and need for interpreter reviewed:: Yes Do you feel safe going back to the place where you live?: Yes      Need for Family Participation in Patient Care: Yes (Comment) Care giver support system in place?: Yes (comment)   Criminal Activity/Legal Involvement Pertinent to Current  Situation/Hospitalization: No - Comment as needed  Activities of Daily Living      Permission Sought/Granted                  Emotional Assessment Appearance:: Appears stated age Attitude/Demeanor/Rapport: Engaged Affect (typically observed): Appropriate Orientation: : Oriented to Self, Oriented to Place, Oriented to  Time      Admission diagnosis:  Stroke [facility]) [I63.9] Stroke (cerebrum) (HCC) [I63.9] Middle cerebral artery embolism, right [I66.01] Patient Active Problem List   Diagnosis Date Noted  . Acute blood loss anemia   . History of CVA with residual deficit   . Stroke (cerebrum) (HCC) 07/03/2020  . 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 Respiratory failure (HCC)   . Encephalopathy acute    PCP:  Pcp, No Pharmacy:   [id number] - [place], [place] - [address] AT [facility] [address] [place] [address] Phone: [phone] Fax: [phone]     Social Determinants of Health (SDOH) Interventions    Readmission Risk Interventions No flowsheet data found.   Quintella Baton, RN, BSN  Trauma/Neuro ICU Case Manager [phone]

## 2020-07-05 NOTE — Progress Notes (Signed)
Inpatient Rehabilitation Admissions Coordinator  I met at bedside with pt spouse using Stefano Gaul # [phone]. I discussed a possible Cir admit, goals and expectations. Wife requests assistance with financial counselor for medicaid. I will contact Shanon Rosser, financial counselor to assist with possible assistance. Pt is a good candidate for CIR once able to tolerate more with therapy. I will follow.  Danne Baxter, RN, MSN Rehab Admissions Coordinator [phone] 07/05/2020 2:17 PM

## 2020-07-05 NOTE — Consult Note (Signed)
Physical Medicine and Rehabilitation Consult   Reason for Consult: Stroke with functional deficits.  Referring Physician: Dr. Roda Shutters  HPI: Ebony Rickel is a 34 y.o. male with history of R-SCA infarct with residual left limp otherwise in relatively good health. He was admitted on 07/03/2020 with dysarthria, fall and "jerking" activity of BUE/BLE.  History taken from chart review, PT, and patient due to dysarthria and language. On evaluation by EMS, he was found to have right gaze deviation and left sided hemiparesis. UDS negative. CT head showed hyperdense R-MCA sign and CTA head/neck showed occlusion of R-ICA 2 cm above origin with reconstitution by PCA and subsequent occlusion of proximal right M1 MCA. He underwent cerebral angiogram with complete revascularization of occluded R-ICA and ICA terminus, R-MCA and R-ACA--question proximal right ICA dissection.  Follow up CT head showed left BG/caudate head infarct with contrast stain (? Bleed)  and mild cerebral edema. He was loaded with Keppra and to continue until clinic follow-up.  He was started on hypertonic saline.  EEG performed, showing severe diffuse encephalopathy, negative for seizures He tolerated extubation on 07/04/2020 and n.p.o. recommended due to lethargy as well as signs of as therapy session.  Piration. Stroke work up underway with coagulopathy panel/TEE pending.  He was started on aspirin and Plavix.  Patient with resultant higher level cognitive deficits, bouts of lethargy, left hemiparesis with left inattention, proprioceptive deficits and lack of awareness of deficits.  However, per therapies, significant progress since previous therapy session.  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  He moved to [place] about 5 months ago and works in [employer].  Does have a relative in [place]. Wife stays at home with autistic child and is limited english speaking. He was independent PTA and has had decline in  function status and CIR recommended for follow up therapy.   Review of Systems  Constitutional: Negative for chills and fever.  HENT: Negative for hearing loss and tinnitus.   Eyes: Negative for blurred vision and double vision.  Respiratory: Negative for cough and shortness of breath.   Cardiovascular: Negative for chest pain and palpitations.  Gastrointestinal: Negative for constipation, heartburn and nausea.  Genitourinary: Negative for dysuria, frequency and urgency.  Musculoskeletal: Negative for back pain, myalgias and neck pain.  Skin: Negative for itching and rash.  Neurological: Positive for speech change, focal weakness, weakness and headaches. Negative for sensory change.  Psychiatric/Behavioral: The patient is not nervous/anxious and does not have insomnia.   All other systems reviewed and are negative.    Past Medical History:  Diagnosis Date  . 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 IR ANGIO INTRA EXTRACRAN SEL COM CAROTID INNOMINATE UNI L MOD SED  07/03/2020  . IR CT HEAD LTD  07/03/2020  . IR CT HEAD LTD  07/03/2020  . IR PERCUTANEOUS ART THROMBECTOMY/INFUSION INTRACRANIAL INC DIAG ANGIO  07/03/2020  . IR US GUIDE VASC ACCESS RIGHT  07/03/2020  . RADIOLOGY WITH ANESTHESIA N/A 07/03/2020   Procedure: IR WITH ANESTHESIA;  Surgeon: Radiologist, Medication, MD;  Location: [facility] OR;  Service: Radiology;  Laterality: N/A;    Family History  Problem Relation Age of Onset  . Hypertension Mother   . Hypertension Father     Social History:  Married. Independent and working PTA. He used to smoke 10 cigarettes a day till a year ago--smoked for 12 years. He drinks 3 20 ounce cans of beer  daily.    Allergies: No Known Allergies    Medications Prior to Admission  Medication Sig Dispense Refill  . aspirin EC 81 MG tablet Take 81 mg by mouth daily. Swallow whole.      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; reports has a 295 y/o daughter and 2 y/o son Functional Status:  Mobility: Bed Mobility Overal bed mobility: Needs Assistance Bed Mobility: Rolling, Sidelying to Sit Rolling: Mod assist Sidelying to sit: Mod assist General bed mobility comments: max directional cues, max for L UE movement, pt able to bring LEs off EOB, modA for trunk elevation Transfers Overall transfer level: Needs assistance Equipment used: 2 person hand held assist Transfers: Sit to/from Stand Sit to Stand: Mod assist, +2 physical assistance General transfer comment: modAx2, L knee blocked, posterior hip support to achieve full upright posture, side stepped to [facility], maxA to to advance L LE to the R, L knee blocked as L knee buckles with advancement of R LE Ambulation/Gait General Gait Details: unable at this time    ADL: ADL Overall ADL's : Needs assistance/impaired Eating/Feeding: NPO Grooming: Moderate assistance, Sitting Upper Body Bathing: Moderate assistance, Sitting Lower Body Bathing: Maximal assistance, +2 for physical assistance, +2 for safety/equipment, Sit to/from stand Upper Body Dressing : Maximal assistance, Sitting Lower Body Dressing: Maximal assistance, +2 for physical assistance, +2 for safety/equipment, Sit to/from stand Lower Body Dressing Details (indicate cue type and reason): attempts to don socks at EOB but ultimately requiring assist including assist for sitting balance and to support LEs in modified figure 4 Toileting- Clothing Manipulation  and Hygiene: Total assistance, +2 for physical assistance, +2 for safety/equipment, Sit to/from stand Functional mobility during ADLs: Maximal assistance, +2 for physical assistance, +2 for safety/equipment (sit<>Stand and side steps along EOB)  Cognition: Cognition Overall Cognitive Status: Impaired/Different from baseline Arousal/Alertness: Lethargic Orientation Level: Oriented X4 Attention: Sustained Sustained Attention: Impaired Sustained Attention Impairment: Functional basic Awareness: Impaired Awareness Impairment: Emergent impairment, Intellectual impairment Problem Solving: Impaired Problem Solving Impairment: Functional basic Safety/Judgment: Impaired Cognition Arousal/Alertness: Awake/alert (sleepy but responsive and participatory) Behavior During Therapy: Flat affect Overall Cognitive Status: Impaired/Different from baseline Area of Impairment: Safety/judgement, Problem solving Safety/Judgement: Decreased awareness of deficits (L neglect) Problem Solving: Slow processing, Difficulty sequencing, Requires verbal cues, Requires tactile cues General Comments: pt difficulty processing due to L neglect and L weakness/impaired proprioceptoin   Blood pressure 129/74, pulse 68, temperature 98.8 F (37.1 C), temperature source Axillary, resp. rate (!) 21, height 5\' 4"  (1.626 m), weight 90.3 kg, SpO2 96 %. Physical Exam Vitals and nursing note reviewed. Exam conducted with a chaperone present.  Constitutional:      General: He is not in acute distress.    Appearance: He is well-developed. He is obese.     Comments: Sleeping but easily aroused--reporting HA.   HENT:     Head: Normocephalic and atraumatic.     Right Ear: External ear normal.     Left Ear: External ear normal.     Nose: Nose normal.  Eyes:     General:        Right eye: No discharge.  Left eye: No discharge.     Extraocular Movements: Extraocular movements intact.  Cardiovascular:     Rate and Rhythm:  Regular rhythm. Tachycardia present.  Pulmonary:     Effort: Pulmonary effort is normal. No respiratory distress.     Breath sounds: No stridor.  Abdominal:     General: Abdomen is flat. Bowel sounds are normal. There is no distension.  Musculoskeletal:        General: Swelling (LUE) present. No tenderness.     Cervical back: Normal range of motion and neck supple.  Skin:    General: Skin is warm and dry.  Neurological:     Mental Status: He is alert and oriented to person, place, and time.     Comments: Alert and oriented with cues Moderate to severe dysarthria Left inattention Motor: LUE/LE: 5/5 proximal distally RUE:?  Trace elbow extension RLE: 3-/5 proximal distal with apraxia  Psychiatric:        Attention and Perception: Attention normal.        Speech: Speech is slurred.        Behavior: Behavior is slowed.     Results for orders placed or performed during the hospital encounter of 07/03/20 (from the past 24 hour(s))  Basic metabolic panel     Status: Abnormal   Collection Time: 07/05/20  3:23 AM  Result Value Ref Range   Sodium 140 135 - 145 mmol/L   Potassium 3.8 3.5 - 5.1 mmol/L   Chloride 106 98 - 111 mmol/L   CO2 25 22 - 32 mmol/L   Glucose, Bld 97 70 - 99 mg/dL   BUN 14 6 - 20 mg/dL   Creatinine, Ser 1.61 0.61 - 1.24 mg/dL   Calcium 8.5 (L) 8.9 - 10.3 mg/dL   GFR, Estimated >09 >60 mL/min   Anion gap 9 5 - 15  CBC     Status: Abnormal   Collection Time: 07/05/20  3:23 AM  Result Value Ref Range   WBC 9.2 4.0 - 10.5 K/uL   RBC 4.01 (L) 4.22 - 5.81 MIL/uL   Hemoglobin 12.6 (L) 13.0 - 17.0 g/dL   HCT 45.4 (L) 39 - 52 %   MCV 92.8 80.0 - 100.0 fL   MCH 31.4 26.0 - 34.0 pg   MCHC 33.9 30.0 - 36.0 g/dL   RDW 09.8 11.9 - 14.7 %   Platelets 162 150 - 400 K/uL   nRBC 0.0 0.0 - 0.2 %   CT HEAD WO CONTRAST  Result Date: 07/03/2020 CLINICAL DATA:  34 year old male code stroke presentation left side weakness this afternoon positive for right ICA occlusion in  the neck, right M1 occlusion. Initial ASPECTS 8. Treated with endovascular revascularization 1500 hours today. EXAM: CT HEAD WITHOUT CONTRAST TECHNIQUE: Contiguous axial images were obtained from the base of the skull through the vertex without intravenous contrast. COMPARISON:  Presentation head CT 1350 hours. FINDINGS: Brain: Stable chronic appearing cerebellar infarcts, including a relatively large right SCA territory infarct. Contrast staining in the posterior lentiform nuclei. Superimposed cytotoxic edema in the right caudate and anterior lentiform, new from the presentation head CT. Mild associated mass effect on the right lateral ventricle, especially the frontal horn. Similar cytotoxic edema at the right insula and external capsule, some of which was present initially. Cytotoxic edema also again suspected at the anterior temporal tip. No acute intracranial hemorrhage identified. ASPECTS MCA segments 2 through 6 appear to remain normal. Contralateral left hemisphere gray-white matter differentiation is stable. No midline shift.  Normal basilar cisterns. Vascular: Small volume residual intravascular contrast. Skull: Negative. Sinuses/Orbits: Visualized paranasal sinuses and mastoids are stable and well pneumatized. Other: No acute orbit or scalp soft tissue finding. Intubated. Small volume retained secretions in the visible pharynx. IMPRESSION: 1. No acute intracranial hemorrhage identified. Dense contrast staining of the posterior right lentiform, where early cytotoxic edema was demonstrated on the presentation scan. 2. New and/or increased cytotoxic edema in the remaining right basal ganglia, right insula, anterior temporal lobe. Mild mass effect on the right lateral ventricle with no midline shift. 3. Stable right greater than left cerebellar infarcts, likely chronic. Electronically Signed   By: Odessa Fleming M.D.   On: 07/03/2020 23:48   MR BRAIN WO CONTRAST  Addendum Date: 07/04/2020   ADDENDUM REPORT:  07/04/2020 14:19 ADDENDUM: These results were called by telephone at the time of interpretation on 07/04/2020 at 2:12 pm to provider Dr. Roda Shutters , who verbally acknowledged these results. Electronically Signed   By: Stana Bunting M.D.   On: 07/04/2020 14:19   Result Date: 07/04/2020 CLINICAL DATA:  Stroke, follow-up. EXAM: MRI HEAD WITHOUT CONTRAST TECHNIQUE: Multiplanar, multiecho pulse sequences of the brain and surrounding structures were obtained without intravenous contrast. COMPARISON:  07/03/2020 noncontrast head CT and CTA head and neck. Later same day 07/03/2020 noncontrast head CT. FINDINGS: Brain: Multifocal restricted diffusion involving the right frontoparietal, occipitotemporal lobes, right insula and right basal ganglia. Remote bilateral cerebellar and right midbrain insults. Right lentiform SWI signal dropout correlates with contrast seen on prior CT. Partial effacement the right lateral ventricle. No midline shift, ventriculomegaly or extra-axial fluid collection. No mass lesion. Vascular: Please see recent CTA. Skull and upper cervical spine: Normal marrow signal. Sinuses/Orbits: Normal orbits. Mild maxillary sinus mucosal thickening with layering ethmoid and sphenoid secretions. Clear mastoid air cells. Other: None. IMPRESSION: Multifocal infarcts involving the right cerebrum and right basal ganglia. Redemonstration of right lentiform contrast. Remote bilateral cerebellar and right midbrain insults. Electronically Signed: By: Stana Bunting M.D. On: 07/04/2020 14:04   IR CT Head Ltd  Result Date: 07/05/2020 INDICATION: Acute onset of seizures. Left-sided neglect and left-sided weakness with right gaze deviation. 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 EXAM: 1. EMERGENT LARGE VESSEL OCCLUSION THROMBOLYSIS (anterior CIRCULATION) COMPARISON:  CT angiogram of the head and neck of July 03, 2020. MEDICATIONS: Ancef 2 g IV antibiotic was administered  within 1 hour of the procedure. ANESTHESIA/SEDATION: General anesthesia CONTRAST:  Omnipaque 300 approximately 120 mL FLUOROSCOPY TIME:  Fluoroscopy Time: 20 minutes 20 seconds (1427 mGy). COMPLICATIONS: None immediate. TECHNIQUE: Following a full explanation of the procedure along with the potential associated complications, an informed witnessed consent was obtained by the interpreter from his live-in girlfriend. The risks of intracranial hemorrhage of 10%, worsening neurological deficit, ventilator dependency, death and inability to revascularize were all reviewed in detail with the patient's girlfriend. The patient was then put under general anesthesia by the Department of Anesthesiology at [facility]. The right groin was prepped and draped in the usual sterile fashion. Thereafter using modified Seldinger technique, transfemoral access into the right common femoral artery was obtained without difficulty. Over a 0.035 inch guidewire an 8 French 25 cm Pinnacle sheath was inserted. Through this, and also over a 0.035 inch guidewire a 5 [place] select JB 1 catheter inside of an 087 balloon guide catheter was advanced to the aortic arch region and selectively positioned in the right common carotid artery. The guidewire, and select diagnostic catheter were  removed. Good aspiration obtained from the hub of the balloon guide catheter in the distal right common carotid artery. An arteriogram was then performed centered intracranially and extracranially. FINDINGS: The right common carotid arteriogram demonstrates the right external carotid artery and its major branches to be widely patent. The right internal carotid artery at the bulb and distal to the bulb demonstrates a tapered angiographic appearance in its proximal 1/3 with complete occlusion with no filling defects and flap noted along the posterior wall proximally. No distal reconstitution is noted intracranially of the right ICA and cavernous and  supraclinoid segments. PROCEDURE: Over a 0.014 inch standard Synchro micro guidewire with a J configuration, a combination of an 021 150 cm microcatheter inside of an 071 Zoom aspiration catheter was advanced without difficulty to the cavernous segment. The balloon guide catheter was now also advanced without any resistance to the distal cervical right ICA. A gentle control arteriogram performed through the balloon guide demonstrated complete occlusion of the right internal carotid artery at the distal cavernous segment. No antegrade flow was noticeable from the proximal right ICA. The micro guidewire was then gently advanced and manipulated with a torque device into the right middle cerebral artery proximally and then into the superior division followed by the microcatheter. Guidewire was removed. Slow aspiration of blood was obtained at the microcatheter hub. A very gentle contrast injection through the microcatheter demonstrated the tip of the microcatheter to be safely positioned with slow flow antegrade clearance of contrast. A 4 mm x 40 mm Solitaire retrieval device was then advanced to the distal end of the microcatheter. The Zoom aspiration catheter was now advanced into the distal right M1 segment. The O ring on the delivery microcatheter was then loosened. The retrieval device was then deployed in the usual fashion without difficulty. The wire was now retrieved more proximally in the Zoom aspiration catheter. Thereafter, constant aspiration was applied at the hub of the Zoom aspiration catheter, and with 20 mL syringe at the hub of the balloon guide catheter for approximately 3 minutes. Thereafter, slow retrieval of the retrieval device, the microcatheter and the aspiration catheter was performed. Copious amounts of clot were seen in the canister of the aspiration device. Also noted was a piece of clot in the interstices of the retrieval device, and also in the Tuohy [place]. Following reversal of flow  arrest in the right internal carotid artery, with free aspiration of blood obtained at the hub, a gentle control arteriogram performed demonstrates free flow in the right internal carotid artery distally in the petrous, cavernous and supraclinoid segments. Noted was opacification of the right middle cerebral artery and the right anterior cerebral artery distributions. Significant spasm was noted in the proximal right middle cerebral artery, and of the superior division of the right middle cerebral artery. These responded to 3 aliquots of 25 mcg of nitroglycerin intra-arterially. At this time, also noted was contrast overlying the right basal ganglia region on angiography. CT of the brain on the table demonstrated dense focal hyper density in the putamen and less prominent in the right caudate head representing contrast with blood. No change was seen in the patient's hemodynamic status at this time. A control arteriogram performed through the balloon guide in the right internal carotid artery continued to demonstrate excellent flow extra cranially and intracranially with now spasm seen in the supraclinoid right ICA, and persistent moderate spasm in the proximal right M1 segment in the superior divisions. The patient was given 5 mg of intra-arterial verapamil  slowly. A repeat arteriogram performed approximately 5 minutes later demonstrated modest improvement in the spasm in the right middle cerebral artery. Repeat arteriograms at 20 minutes following the initial revascularization demonstrated significant improvement in the right middle cerebral artery and the right internal carotid artery vasospasm. There continued to be a TICI 3 revascularization. An exchange 0.014 inch Synchro micro guidewire was then advanced to the horizontal petrous segment. An arteriogram was then performed with the balloon guide catheter in the distal right common carotid artery. This continued to demonstrate excellent flow extra cranially and  intracranially. No visible irregularity or an intimal flap was visible. Intracranially improvement is noted in the supraclinoid right ICA. No change was seen in the small focal outpouching in the right posterior communicating artery region suspicious for an aneurysm. A final control arteriogram performed through the right common carotid artery demonstrated patency of the right internal carotid artery extra cranially and intracranially without evidence of intraluminal filling defects or spasm. The right middle cerebral artery continued to maintain a TICI 3 revascularization. The right anterior cerebral artery remained widely patent. The balloon guide was removed. The 8 French Pinnacle sheath was then replaced with an 8 French Angio-Seal closure device for hemostasis in the right groin. Distal pulses remained palpable in both feet unchanged. Patient was left intubated on account of his medical condition. CT of the brain performed continued to demonstrate no change in the hyperdensity in the right basal ganglia region. Patient was then transferred to neuro ICU for post revascularization management. IMPRESSION: Status post endovascular complete revascularization of the right internal carotid artery terminus, the right middle cerebral artery, the right anterior cerebral artery, and the extracranial right internal carotid artery, with 1 pass with the 4 mm x 40 mm Solitaire retrieval device, with aspiration, and proximal flow arrest achieving a TICI 3 revascularization. 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 PLAN: Follow-up in the clinic 4 to 6 weeks post discharge. Electronically Signed   By: Julieanne Cotton M.D.   On: 07/04/2020 11:00   IR CT Head Ltd  Result Date: 07/05/2020 INDICATION: Acute onset of seizures. Left-sided neglect and left-sided weakness with right gaze deviation. Occluded right internal carotid artery at the  terminus, and in the neck. EXAM: 1. EMERGENT LARGE VESSEL OCCLUSION THROMBOLYSIS (anterior CIRCULATION) COMPARISON:  CT angiogram of the head and neck of July 03, 2020. MEDICATIONS: Ancef 2 g IV antibiotic was administered within 1 hour of the procedure. ANESTHESIA/SEDATION: General anesthesia CONTRAST:  Omnipaque 300 approximately 120 mL FLUOROSCOPY TIME:  Fluoroscopy Time: 20 minutes 20 seconds (1427 mGy). COMPLICATIONS: None immediate. TECHNIQUE: Following a full explanation of the procedure along with the potential associated complications, an informed witnessed consent was obtained by the interpreter from his live-in girlfriend. The risks of intracranial hemorrhage of 10%, worsening neurological deficit, ventilator dependency, death and inability to revascularize were all reviewed in detail with the patient's girlfriend. The patient was then put under general anesthesia by the Department of Anesthesiology at [facility]. The right groin was prepped and draped in the usual sterile fashion. Thereafter using modified Seldinger technique, transfemoral access into the right common femoral artery was obtained without difficulty. Over a 0.035 inch guidewire an 8 French 25 cm Pinnacle sheath was inserted. Through this, and also over a 0.035 inch guidewire a 5 [place] select JB 1 catheter inside of an 087 balloon guide catheter was advanced to the aortic arch region and selectively positioned  in the right common carotid artery. The guidewire, and select diagnostic catheter were removed. Good aspiration obtained from the hub of the balloon guide catheter in the distal right common carotid artery. An arteriogram was then performed centered intracranially and extracranially. FINDINGS: The right common carotid arteriogram demonstrates the right external carotid artery and its major branches to be widely patent. The right internal carotid artery at the bulb and distal to the bulb demonstrates a tapered angiographic  appearance in its proximal 1/3 with complete occlusion with no filling defects and flap noted along the posterior wall proximally. No distal reconstitution is noted intracranially of the right ICA and cavernous and supraclinoid segments. PROCEDURE: Over a 0.014 inch standard Synchro micro guidewire with a J configuration, a combination of an 021 150 cm microcatheter inside of an 071 Zoom aspiration catheter was advanced without difficulty to the cavernous segment. The balloon guide catheter was now also advanced without any resistance to the distal cervical right ICA. A gentle control arteriogram performed through the balloon guide demonstrated complete occlusion of the right internal carotid artery at the distal cavernous segment. No antegrade flow was noticeable from the proximal right ICA. The micro guidewire was then gently advanced and manipulated with a torque device into the right middle cerebral artery proximally and then into the superior division followed by the microcatheter. Guidewire was removed. Slow aspiration of blood was obtained at the microcatheter hub. A very gentle contrast injection through the microcatheter demonstrated the tip of the microcatheter to be safely positioned with slow flow antegrade clearance of contrast. A 4 mm x 40 mm Solitaire retrieval device was then advanced to the distal end of the microcatheter. The Zoom aspiration catheter was now advanced into the distal right M1 segment. The O ring on the delivery microcatheter was then loosened. The retrieval device was then deployed in the usual fashion without difficulty. The wire was now retrieved more proximally in the Zoom aspiration catheter. Thereafter, constant aspiration was applied at the hub of the Zoom aspiration catheter, and with 20 mL syringe at the hub of the balloon guide catheter for approximately 3 minutes. Thereafter, slow retrieval of the retrieval device, the microcatheter and the aspiration catheter was  performed. Copious amounts of clot were seen in the canister of the aspiration device. Also noted was a piece of clot in the interstices of the retrieval device, and also in the Tuohy [place]. Following reversal of flow arrest in the right internal carotid artery, with free aspiration of blood obtained at the hub, a gentle control arteriogram performed demonstrates free flow in the right internal carotid artery distally in the petrous, cavernous and supraclinoid segments. Noted was opacification of the right middle cerebral artery and the right anterior cerebral artery distributions. Significant spasm was noted in the proximal right middle cerebral artery, and of the superior division of the right middle cerebral artery. These responded to 3 aliquots of 25 mcg of nitroglycerin intra-arterially. At this time, also noted was contrast overlying the right basal ganglia region on angiography. CT of the brain on the table demonstrated dense focal hyper density in the putamen and less prominent in the right caudate head representing contrast with blood. No change was seen in the patient's hemodynamic status at this time. A control arteriogram performed through the balloon guide in the right internal carotid artery continued to demonstrate excellent flow extra cranially and intracranially with now spasm seen in the supraclinoid right ICA, and persistent moderate spasm in the proximal right M1 segment  in the superior divisions. The patient was given 5 mg of intra-arterial verapamil slowly. A repeat arteriogram performed approximately 5 minutes later demonstrated modest improvement in the spasm in the right middle cerebral artery. Repeat arteriograms at 20 minutes following the initial revascularization demonstrated significant improvement in the right middle cerebral artery and the right internal carotid artery vasospasm. There continued to be a TICI 3 revascularization. An exchange 0.014 inch Synchro micro guidewire was then  advanced to the horizontal petrous segment. An arteriogram was then performed with the balloon guide catheter in the distal right common carotid artery. This continued to demonstrate excellent flow extra cranially and intracranially. No visible irregularity or an intimal flap was visible. Intracranially improvement is noted in the supraclinoid right ICA. No change was seen in the small focal outpouching in the right posterior communicating artery region suspicious for an aneurysm. A final control arteriogram performed through the right common carotid artery demonstrated patency of the right internal carotid artery extra cranially and intracranially without evidence of intraluminal filling defects or spasm. The right middle cerebral artery continued to maintain a TICI 3 revascularization. The right anterior cerebral artery remained widely patent. The balloon guide was removed. The 8 French Pinnacle sheath was then replaced with an 8 French Angio-Seal closure device for hemostasis in the right groin. Distal pulses remained palpable in both feet unchanged. Patient was left intubated on account of his medical condition. CT of the brain performed continued to demonstrate no change in the hyperdensity in the right basal ganglia region. Patient was then transferred to neuro ICU for post revascularization management. IMPRESSION: Status post endovascular complete revascularization of the right internal carotid artery terminus, the right middle cerebral artery, the right anterior cerebral artery, and the extracranial right internal carotid artery, with 1 pass with the 4 mm x 40 mm Solitaire retrieval device, with aspiration, and proximal flow arrest achieving a TICI 3 revascularization. 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 PLAN: Follow-up in the clinic 4 to 6 weeks post discharge. Electronically Signed   By: Julieanne Cotton M.D.   On:  07/04/2020 11:00   IR US Guide Vasc Access Right  Result Date: 07/05/2020 INDICATION: Acute onset of seizures. Left-sided neglect and left-sided weakness with right gaze deviation. 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 EXAM: 1. EMERGENT LARGE VESSEL OCCLUSION THROMBOLYSIS (anterior CIRCULATION) COMPARISON:  CT angiogram of the head and neck of July 03, 2020. MEDICATIONS: Ancef 2 g IV antibiotic was administered within 1 hour of the procedure. ANESTHESIA/SEDATION: General anesthesia CONTRAST:  Omnipaque 300 approximately 120 mL FLUOROSCOPY TIME:  Fluoroscopy Time: 20 minutes 20 seconds (1427 mGy). COMPLICATIONS: None immediate. TECHNIQUE: Following a full explanation of the procedure along with the potential associated complications, an informed witnessed consent was obtained by the interpreter from his live-in girlfriend. The risks of intracranial hemorrhage of 10%, worsening neurological deficit, ventilator dependency, death and inability to revascularize were all reviewed in detail with the patient's girlfriend. The patient was then put under general anesthesia by the Department of Anesthesiology at [facility]. The right groin was prepped and draped in the usual sterile fashion. Thereafter using modified Seldinger technique, transfemoral access into the right common femoral artery was obtained without difficulty. Over a 0.035 inch guidewire an 8 French 25 cm Pinnacle sheath was inserted. Through this, and also over a 0.035 inch guidewire a 5 [place] select JB 1 catheter inside of  an 087 balloon guide catheter was advanced to the aortic arch region and selectively positioned in the right common carotid artery. The guidewire, and select diagnostic catheter were removed. Good aspiration obtained from the hub of the balloon guide catheter in the distal right common carotid artery. An arteriogram was then performed centered intracranially and extracranially. FINDINGS:  The right common carotid arteriogram demonstrates the right external carotid artery and its major branches to be widely patent. The right internal carotid artery at the bulb and distal to the bulb demonstrates a tapered angiographic appearance in its proximal 1/3 with complete occlusion with no filling defects and flap noted along the posterior wall proximally. No distal reconstitution is noted intracranially of the right ICA and cavernous and supraclinoid segments. PROCEDURE: Over a 0.014 inch standard Synchro micro guidewire with a J configuration, a combination of an 021 150 cm microcatheter inside of an 071 Zoom aspiration catheter was advanced without difficulty to the cavernous segment. The balloon guide catheter was now also advanced without any resistance to the distal cervical right ICA. A gentle control arteriogram performed through the balloon guide demonstrated complete occlusion of the right internal carotid artery at the distal cavernous segment. No antegrade flow was noticeable from the proximal right ICA. The micro guidewire was then gently advanced and manipulated with a torque device into the right middle cerebral artery proximally and then into the superior division followed by the microcatheter. Guidewire was removed. Slow aspiration of blood was obtained at the microcatheter hub. A very gentle contrast injection through the microcatheter demonstrated the tip of the microcatheter to be safely positioned with slow flow antegrade clearance of contrast. A 4 mm x 40 mm Solitaire retrieval device was then advanced to the distal end of the microcatheter. The Zoom aspiration catheter was now advanced into the distal right M1 segment. The O ring on the delivery microcatheter was then loosened. The retrieval device was then deployed in the usual fashion without difficulty. The wire was now retrieved more proximally in the Zoom aspiration catheter. Thereafter, constant aspiration was applied at the hub of  the Zoom aspiration catheter, and with 20 mL syringe at the hub of the balloon guide catheter for approximately 3 minutes. Thereafter, slow retrieval of the retrieval device, the microcatheter and the aspiration catheter was performed. Copious amounts of clot were seen in the canister of the aspiration device. Also noted was a piece of clot in the interstices of the retrieval device, and also in the Tuohy [place]. Following reversal of flow arrest in the right internal carotid artery, with free aspiration of blood obtained at the hub, a gentle control arteriogram performed demonstrates free flow in the right internal carotid artery distally in the petrous, cavernous and supraclinoid segments. Noted was opacification of the right middle cerebral artery and the right anterior cerebral artery distributions. Significant spasm was noted in the proximal right middle cerebral artery, and of the superior division of the right middle cerebral artery. These responded to 3 aliquots of 25 mcg of nitroglycerin intra-arterially. At this time, also noted was contrast overlying the right basal ganglia region on angiography. CT of the brain on the table demonstrated dense focal hyper density in the putamen and less prominent in the right caudate head representing contrast with blood. No change was seen in the patient's hemodynamic status at this time. A control arteriogram performed through the balloon guide in the right internal carotid artery continued to demonstrate excellent flow extra cranially and intracranially with now spasm seen  in the supraclinoid right ICA, and persistent moderate spasm in the proximal right M1 segment in the superior divisions. The patient was given 5 mg of intra-arterial verapamil slowly. A repeat arteriogram performed approximately 5 minutes later demonstrated modest improvement in the spasm in the right middle cerebral artery. Repeat arteriograms at 20 minutes following the initial revascularization  demonstrated significant improvement in the right middle cerebral artery and the right internal carotid artery vasospasm. There continued to be a TICI 3 revascularization. An exchange 0.014 inch Synchro micro guidewire was then advanced to the horizontal petrous segment. An arteriogram was then performed with the balloon guide catheter in the distal right common carotid artery. This continued to demonstrate excellent flow extra cranially and intracranially. No visible irregularity or an intimal flap was visible. Intracranially improvement is noted in the supraclinoid right ICA. No change was seen in the small focal outpouching in the right posterior communicating artery region suspicious for an aneurysm. A final control arteriogram performed through the right common carotid artery demonstrated patency of the right internal carotid artery extra cranially and intracranially without evidence of intraluminal filling defects or spasm. The right middle cerebral artery continued to maintain a TICI 3 revascularization. The right anterior cerebral artery remained widely patent. The balloon guide was removed. The 8 French Pinnacle sheath was then replaced with an 8 French Angio-Seal closure device for hemostasis in the right groin. Distal pulses remained palpable in both feet unchanged. Patient was left intubated on account of his medical condition. CT of the brain performed continued to demonstrate no change in the hyperdensity in the right basal ganglia region. Patient was then transferred to neuro ICU for post revascularization management. IMPRESSION: Status post endovascular complete revascularization of the right internal carotid artery terminus, the right middle cerebral artery, the right anterior cerebral artery, and the extracranial right internal carotid artery, with 1 pass with the 4 mm x 40 mm Solitaire retrieval device, with aspiration, and proximal flow arrest achieving a TICI 3 revascularization. Proximal right  ICA occlusion suggestive of being related to a dissection with secondary clot formation and distal emboli. PLAN: Follow-up in the clinic 4 to 6 weeks post discharge. Electronically Signed   By: Julieanne Cotton M.D.   On: 07/04/2020 11:00   Portable Chest xray  Result Date: 07/04/2020 CLINICAL DATA:  Intubation. EXAM: PORTABLE CHEST 1 VIEW COMPARISON:  07/03/2020. FINDINGS: Endotracheal tube tip 3.7 cm above the carina. Mediastinum hilar structures appear stable. Heart size appears stable. Low lung volumes with bilateral subsegmental atelectasis. No pleural effusion or pneumothorax. IMPRESSION: 1. Endotracheal tube tip 3.7 cm above the carina. 2. Low lung volumes with bilateral subsegmental atelectasis. Electronically Signed   By: Maisie Fus  Register   On: 07/04/2020 06:50   Portable Chest x-ray  Result Date: 07/03/2020 CLINICAL DATA:  Respiratory failure. EXAM: PORTABLE CHEST 1 VIEW COMPARISON:  None. FINDINGS: An endotracheal tube is seen with its distal tip approximately 3.2 cm from the carina. There is no evidence of acute infiltrate, pleural effusion or pneumothorax. The heart size and mediastinal contours are within normal limits. The visualized skeletal structures are unremarkable. IMPRESSION: Endotracheal tube positioning, as described above, without evidence of acute or active cardiopulmonary disease. Electronically Signed   By: Aram Candela M.D.   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 EEG to evaluate for seizure Level of alertness: Awake AEDs during EEG study: LEV Technical aspects: This EEG study was done with scalp  electrodes positioned according to the 10-20 International system of electrode placement. Electrical activity was acquired at a sampling rate of 500Hz  and reviewed with a high frequency filter of 70Hz  and a low frequency filter of 1Hz . EEG data were recorded continuously and digitally stored. Description: EEG showed continuous generalized and maximal right temporal region 2-3Hz  with overriding 13-14Hz  generalized beta activity. Hyperventilation and photic stimulation were not performed.   ABNORMALITY -Continuous slow, generalized IMPRESSION: This study is suggestive of severe diffuse encephalopathy, nonspecific etiology. No seizures or epileptiform discharges were seen throughout the recording. Charlsie Quest   ECHOCARDIOGRAM COMPLETE  Result Date: 07/04/2020    ECHOCARDIOGRAM REPORT   Patient Name:   HAYK DIVIS Date of Exam: 07/04/2020 Medical Rec #:  [id number]    Height:       64.0 in Accession #:    [id number]   Weight:       199.1 lb Date of Birth:  1985/12/02     BSA:          1.953 m Patient Age:    34 years     BP:           141/67 mmHg Patient Gender: M            HR:           91 bpm. Exam Location:  Inpatient Procedure: 2D Echo Indications:   434.91 stroke  History:       Patient has no prior history of Echocardiogram examinations.                Stroke.  Sonographer:   Celene Skeen RDCS (AE) Referring      [id number] [facility] Phys:  Sonographer Comments: Restricted mobility. poor patient compliance (unable to remain still at times) IMPRESSIONS  1. Left ventricular ejection fraction, by estimation, is 60 to 65%. The left ventricle has normal function. The left ventricle has no regional wall motion abnormalities. Left ventricular diastolic parameters were normal.  2. Right ventricular systolic function is normal. The right ventricular size is normal.  3. The mitral valve is grossly normal. No evidence of mitral valve regurgitation.  4. The aortic valve was not well visualized. Aortic valve  regurgitation is not visualized. Comparison(s): No prior Echocardiogram. FINDINGS  Left Ventricle: Left ventricular ejection fraction, by estimation, is 60 to 65%. The left ventricle has normal function. The left ventricle has no regional wall motion abnormalities. The left ventricular internal cavity size was normal in size. There is  no left ventricular hypertrophy. Left ventricular diastolic parameters were normal. Right Ventricle: The right ventricular size is normal. Right vetricular wall thickness was not well visualized. Right ventricular systolic function is normal. Left Atrium: Left atrial size was normal in size. Right Atrium: Right atrial size was normal in size. Pericardium: There is no evidence of pericardial effusion. Mitral Valve: The mitral valve is grossly normal. No evidence of mitral valve regurgitation. Tricuspid Valve: The tricuspid valve is grossly normal. Tricuspid valve regurgitation is not demonstrated. Aortic Valve: The aortic valve was not well visualized. Aortic valve regurgitation is not visualized. Pulmonic Valve: The pulmonic valve was grossly normal. Pulmonic valve regurgitation is not visualized. Aorta: The aortic root  is normal in size and structure. IAS/Shunts: The interatrial septum was not assessed.  LEFT VENTRICLE PLAX 2D LVIDd:         4.50 cm  Diastology LVIDs:         2.40 cm  LV e' medial:    10.70 cm/s LV PW:         0.90 cm  LV E/e' medial:  10.4 LV IVS:        1.10 cm  LV e' lateral:   15.90 cm/s LVOT diam:     2.30 cm  LV E/e' lateral: 7.0 LV SV:         79 LV SV Index:   40 LVOT Area:     4.15 cm  RIGHT VENTRICLE RV S prime:     15.30 cm/s TAPSE (M-mode): 1.7 cm LEFT ATRIUM             Index LA diam:        3.00 cm 1.54 cm/m LA Vol (A2C):   29.6 ml 15.16 ml/m LA Vol (A4C):   32.9 ml 16.85 ml/m LA Biplane Vol: 31.4 ml 16.08 ml/m  AORTIC VALVE LVOT Vmax:   111.00 cm/s LVOT Vmean:  74.900 cm/s LVOT VTI:    0.189 m  AORTA Ao Root diam: 2.80 cm MITRAL VALVE MV Area  (PHT): 5.75 cm     SHUNTS MV Decel Time: 132 msec     Systemic VTI:  0.19 m MV E velocity: 111.00 cm/s  Systemic Diam: 2.30 cm MV A velocity: 67.30 cm/s MV E/A ratio:  1.65 Riley Lam MD Electronically signed by Riley Lam MD Signature Date/Time: 07/04/2020/2:33:40 PM    Final    IR PERCUTANEOUS ART THROMBECTOMY/INFUSION INTRACRANIAL INC DIAG ANGIO  Result Date: 07/05/2020 INDICATION: Acute onset of seizures. Left-sided neglect and left-sided weakness with right gaze deviation. 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 EXAM: 1. EMERGENT LARGE VESSEL OCCLUSION THROMBOLYSIS (anterior CIRCULATION) COMPARISON:  CT angiogram of the head and neck of July 03, 2020. MEDICATIONS: Ancef 2 g IV antibiotic was administered within 1 hour of the procedure. ANESTHESIA/SEDATION: General anesthesia CONTRAST:  Omnipaque 300 approximately 120 mL FLUOROSCOPY TIME:  Fluoroscopy Time: 20 minutes 20 seconds (1427 mGy). COMPLICATIONS: None immediate. TECHNIQUE: Following a full explanation of the procedure along with the potential associated complications, an informed witnessed consent was obtained by the interpreter from his live-in girlfriend. The risks of intracranial hemorrhage of 10%, worsening neurological deficit, ventilator dependency, death and inability to revascularize were all reviewed in detail with the patient's girlfriend. The patient was then put under general anesthesia by the Department of Anesthesiology at [facility]. The right groin was prepped and draped in the usual sterile fashion. Thereafter using modified Seldinger technique, transfemoral access into the right common femoral artery was obtained without difficulty. Over a 0.035 inch guidewire an 8 French 25 cm Pinnacle sheath was inserted. Through this, and also over a 0.035 inch guidewire a 5 [place] select JB 1 catheter inside of an 087 balloon guide catheter was advanced to the aortic arch region  and selectively positioned in the right common carotid artery. The guidewire, and select diagnostic catheter were removed. Good aspiration obtained from the hub of the balloon guide catheter in the distal right common carotid artery. An arteriogram was then performed centered intracranially and extracranially. FINDINGS: The right common carotid arteriogram demonstrates the right external carotid artery and its major branches to be widely patent. The right internal carotid  artery at the bulb and distal to the bulb demonstrates a tapered angiographic appearance in its proximal 1/3 with complete occlusion with no filling defects and flap noted along the posterior wall proximally. No distal reconstitution is noted intracranially of the right ICA and cavernous and supraclinoid segments. PROCEDURE: Over a 0.014 inch standard Synchro micro guidewire with a J configuration, a combination of an 021 150 cm microcatheter inside of an 071 Zoom aspiration catheter was advanced without difficulty to the cavernous segment. The balloon guide catheter was now also advanced without any resistance to the distal cervical right ICA. A gentle control arteriogram performed through the balloon guide demonstrated complete occlusion of the right internal carotid artery at the distal cavernous segment. No antegrade flow was noticeable from the proximal right ICA. The micro guidewire was then gently advanced and manipulated with a torque device into the right middle cerebral artery proximally and then into the superior division followed by the microcatheter. Guidewire was removed. Slow aspiration of blood was obtained at the microcatheter hub. A very gentle contrast injection through the microcatheter demonstrated the tip of the microcatheter to be safely positioned with slow flow antegrade clearance of contrast. A 4 mm x 40 mm Solitaire retrieval device was then advanced to the distal end of the microcatheter. The Zoom aspiration catheter was  now advanced into the distal right M1 segment. The O ring on the delivery microcatheter was then loosened. The retrieval device was then deployed in the usual fashion without difficulty. The wire was now retrieved more proximally in the Zoom aspiration catheter. Thereafter, constant aspiration was applied at the hub of the Zoom aspiration catheter, and with 20 mL syringe at the hub of the balloon guide catheter for approximately 3 minutes. Thereafter, slow retrieval of the retrieval device, the microcatheter and the aspiration catheter was performed. Copious amounts of clot were seen in the canister of the aspiration device. Also noted was a piece of clot in the interstices of the retrieval device, and also in the Tuohy [place]. Following reversal of flow arrest in the right internal carotid artery, with free aspiration of blood obtained at the hub, a gentle control arteriogram performed demonstrates free flow in the right internal carotid artery distally in the petrous, cavernous and supraclinoid segments. Noted was opacification of the right middle cerebral artery and the right anterior cerebral artery distributions. Significant spasm was noted in the proximal right middle cerebral artery, and of the superior division of the right middle cerebral artery. These responded to 3 aliquots of 25 mcg of nitroglycerin intra-arterially. At this time, also noted was contrast overlying the right basal ganglia region on angiography. CT of the brain on the table demonstrated dense focal hyper density in the putamen and less prominent in the right caudate head representing contrast with blood. No change was seen in the patient's hemodynamic status at this time. A control arteriogram performed through the balloon guide in the right internal carotid artery continued to demonstrate excellent flow extra cranially and intracranially with now spasm seen in the supraclinoid right ICA, and persistent moderate spasm in the proximal right  M1 segment in the superior divisions. The patient was given 5 mg of intra-arterial verapamil slowly. A repeat arteriogram performed approximately 5 minutes later demonstrated modest improvement in the spasm in the right middle cerebral artery. Repeat arteriograms at 20 minutes following the initial revascularization demonstrated significant improvement in the right middle cerebral artery and the right internal carotid artery vasospasm. There continued to be a TICI  3 revascularization. An exchange 0.014 inch Synchro micro guidewire was then advanced to the horizontal petrous segment. An arteriogram was then performed with the balloon guide catheter in the distal right common carotid artery. This continued to demonstrate excellent flow extra cranially and intracranially. No visible irregularity or an intimal flap was visible. Intracranially improvement is noted in the supraclinoid right ICA. No change was seen in the small focal outpouching in the right posterior communicating artery region suspicious for an aneurysm. A final control arteriogram performed through the right common carotid artery demonstrated patency of the right internal carotid artery extra cranially and intracranially without evidence of intraluminal filling defects or spasm. The right middle cerebral artery continued to maintain a TICI 3 revascularization. The right anterior cerebral artery remained widely patent. The balloon guide was removed. The 8 French Pinnacle sheath was then replaced with an 8 French Angio-Seal closure device for hemostasis in the right groin. Distal pulses remained palpable in both feet unchanged. Patient was left intubated on account of his medical condition. CT of the brain performed continued to demonstrate no change in the hyperdensity in the right basal ganglia region. Patient was then transferred to neuro ICU for post revascularization management. IMPRESSION: Status post endovascular complete revascularization of the  right internal carotid artery terminus, the right middle cerebral artery, the right anterior cerebral artery, and the extracranial right internal carotid artery, with 1 pass with the 4 mm x 40 mm Solitaire retrieval device, with aspiration, and proximal flow arrest achieving a TICI 3 revascularization. 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 PLAN: Follow-up in the clinic 4 to 6 weeks post discharge. Electronically Signed   By: Julieanne Cotton M.D.   On: 07/04/2020 11:00   CT HEAD CODE STROKE WO CONTRAST  Result Date: 07/03/2020 CLINICAL DATA:  Code stroke.  Left-sided weakness EXAM: CT HEAD WITHOUT CONTRAST TECHNIQUE: Contiguous axial images were obtained from the base of the skull through the vertex without intravenous contrast. COMPARISON:  None. FINDINGS: Brain: There is no acute intracranial hemorrhage. There is suspected hypoattenuation and loss of gray-white differentiation involving the anterior right temporal lobe and inferior lentiform nucleus. There is no mass effect. Ventricles and sulci are within normal limits in size and configuration. There are age-indeterminate but probably chronic right greater than left cerebellar infarcts. Vascular: There is increased density along the right M1 MCA. Skull: Unremarkable. Sinuses/Orbits: No acute abnormality. Other: Mastoid air cells are clear. ASPECTS [facility] Stroke Program Early CT Score) - Ganglionic level infarction (caudate, lentiform nuclei, internal capsule, insula, M1-M3 cortex): 5 - Supraganglionic infarction (M4-M6 cortex): 3 Total score (0-10 with 10 being normal): 8 IMPRESSION: There is no acute intracranial hemorrhage. Suspected thrombus within the right MCA and acute right MCA territory infarction. ASPECT score is 8. These results were called by telephone at the time of interpretation on 07/03/2020 at 1:54 pm to provider [facility] , who verbally  acknowledged these results. Electronically Signed   By: Guadlupe Spanish M.D.   On: 07/03/2020 14:06   VAS [place] LOWER EXTREMITY VENOUS (DVT)  Result Date: 07/04/2020  Lower Venous DVTStudy Indications: Edema.  Risk Factors: Immobility. Performing Technologist: Jannet Askew RCT RDMS  Examination Guidelines: A complete evaluation includes B-mode imaging, spectral Doppler, color Doppler, and power Doppler as needed of all accessible portions of each vessel. Bilateral testing is considered an integral part of a complete examination. Limited examinations for reoccurring indications may be performed as noted.  The reflux portion of the exam is performed with the patient in reverse Trendelenburg.  +---------+---------------+---------+-----------+----------+--------------+ RIGHT    CompressibilityPhasicitySpontaneityPropertiesThrombus Aging +---------+---------------+---------+-----------+----------+--------------+ CFV      Full           Yes      Yes                                 +---------+---------------+---------+-----------+----------+--------------+ SFJ      Full                                                        +---------+---------------+---------+-----------+----------+--------------+ FV Prox  Full                                                        +---------+---------------+---------+-----------+----------+--------------+ FV Mid   Full                                                        +---------+---------------+---------+-----------+----------+--------------+ FV DistalFull                                                        +---------+---------------+---------+-----------+----------+--------------+ PFV      Full                                                        +---------+---------------+---------+-----------+----------+--------------+ POP      Full           Yes      Yes                                  +---------+---------------+---------+-----------+----------+--------------+ PTV      Full                                                        +---------+---------------+---------+-----------+----------+--------------+ PERO     Full                                                        +---------+---------------+---------+-----------+----------+--------------+   +---------+---------------+---------+-----------+----------+--------------+ LEFT     CompressibilityPhasicitySpontaneityPropertiesThrombus Aging +---------+---------------+---------+-----------+----------+--------------+ CFV      Full           Yes  Yes                                 +---------+---------------+---------+-----------+----------+--------------+ SFJ      Full                                                        +---------+---------------+---------+-----------+----------+--------------+ FV Prox  Full                                                        +---------+---------------+---------+-----------+----------+--------------+ FV Mid   Full                                                        +---------+---------------+---------+-----------+----------+--------------+ FV DistalFull                                                        +---------+---------------+---------+-----------+----------+--------------+ PFV      Full                                                        +---------+---------------+---------+-----------+----------+--------------+ POP      Full           Yes      Yes                                 +---------+---------------+---------+-----------+----------+--------------+ PTV      Full                                                        +---------+---------------+---------+-----------+----------+--------------+ PERO     Full                                                         +---------+---------------+---------+-----------+----------+--------------+     Summary: RIGHT: - There is no evidence of deep vein thrombosis in the lower extremity.  - No cystic structure found in the popliteal fossa.  LEFT: - There is no evidence of deep vein thrombosis in the lower extremity.  - No cystic structure found in the popliteal fossa.  *See table(s) above for measurements and observations. Electronically signed by Gretta Began MD on 07/04/2020 at 4:25:09 PM.    Final  CT ANGIO HEAD CODE STROKE  Result Date: 07/03/2020 CLINICAL DATA:  Code stroke follow-up EXAM: CT ANGIOGRAPHY HEAD AND NECK TECHNIQUE: Multidetector CT imaging of the head and neck was performed using the standard protocol during bolus administration of intravenous contrast. Multiplanar CT image reconstructions and MIPs were obtained to evaluate the vascular anatomy. Carotid stenosis measurements (when applicable) are obtained utilizing NASCET criteria, using the distal internal carotid diameter as the denominator. CONTRAST:  52mL OMNIPAQUE IOHEXOL 350 MG/ML SOLN COMPARISON:  None. FINDINGS: CTA NECK FINDINGS Aortic arch: Great vessel origins are patent. Right carotid system: Common and external carotid arteries are patent. There is occlusion of the cervical ICA approximately 2 cm above the origin. No reconstitution in the neck. Left carotid system: Patent. Vertebral arteries: Patent.  Left vertebral artery is dominant. Skeleton: No acute abnormality. Other neck: No significant abnormality. Upper chest: Included upper lungs are clear. Review of the MIP images confirms the above findings CTA HEAD FINDINGS Anterior circulation: There is occlusion of the intracranial right ICA to the level of the PCOM origin where there is reconstitution. There is subsequent occlusion of the proximal right M1 MCA extending to the bifurcation where there is partial reconstitution. Left intracranial internal carotid, left middle cerebral artery, and both  anterior cerebral arteries are patent. Mild calcified plaque along the left ICA. Posterior circulation: Intracranial vertebral arteries, basilar artery, and posterior cerebral arteries are patent. There are right larger than left posterior communicating arteries. Venous sinuses: As permitted by contrast timing, patent. 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 Reconstitution intracranially by a posterior communicating artery. Subsequent occlusion of the proximal right M1 MCA extending to the bifurcation where there is partial reconstitution. These results were communicated to Dr. Derry Lory at 2:13 pm on 07/03/2020 by text page via the [facility] messaging system. Electronically Signed   By: Guadlupe Spanish M.D.   On: 07/03/2020 14:23   CT ANGIO NECK CODE STROKE  Result Date: 07/03/2020 CLINICAL DATA:  Code stroke follow-up EXAM: CT ANGIOGRAPHY HEAD AND NECK TECHNIQUE: Multidetector CT imaging of the head and neck was performed using the standard protocol during bolus administration of intravenous contrast. Multiplanar CT image reconstructions and MIPs were obtained to evaluate the vascular anatomy. Carotid stenosis measurements (when applicable) are obtained utilizing NASCET criteria, using the distal internal carotid diameter as the denominator. CONTRAST:  68mL OMNIPAQUE IOHEXOL 350 MG/ML SOLN COMPARISON:  None. FINDINGS: CTA NECK FINDINGS Aortic arch: Great vessel origins are patent. Right carotid system: Common and external carotid arteries are patent. There is occlusion of the cervical ICA approximately 2 cm above the origin. No reconstitution in the neck. Left carotid system: Patent. Vertebral arteries: Patent.  Left vertebral artery is dominant. Skeleton: No acute abnormality. Other neck: No significant abnormality. Upper chest: Included upper lungs are clear. Review of the MIP images confirms the above findings CTA  HEAD FINDINGS Anterior circulation: There is occlusion of the intracranial right ICA to the level of the PCOM origin where there is reconstitution. There is subsequent occlusion of the proximal right M1 MCA extending to the bifurcation where there is partial reconstitution. Left intracranial internal carotid, left middle cerebral artery, and both anterior cerebral arteries are patent. Mild calcified plaque along the left ICA. Posterior circulation: Intracranial vertebral arteries, basilar artery, and posterior cerebral arteries are patent. There are right larger than left posterior communicating arteries. Venous sinuses: As permitted by contrast timing, patent. 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 Reconstitution intracranially by a posterior communicating artery. Subsequent occlusion of the proximal right M1 MCA extending to the bifurcation where there is partial reconstitution. These results were communicated to Dr. Derry Lory at 2:13 pm on 07/03/2020 by text page via the [facility] messaging system. Electronically Signed   By: Guadlupe Spanish M.D.   On: 07/03/2020 14:23   IR ANGIO INTRA EXTRACRAN SEL COM CAROTID INNOMINATE UNI L MOD SED  Result Date: 07/05/2020 INDICATION: Acute onset of seizures. Left-sided neglect and left-sided weakness with right gaze deviation. 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 EXAM: 1. EMERGENT LARGE VESSEL OCCLUSION THROMBOLYSIS (anterior CIRCULATION) COMPARISON:  CT angiogram of the head and neck of July 03, 2020. MEDICATIONS: Ancef 2 g IV antibiotic was administered within 1 hour of the procedure. ANESTHESIA/SEDATION: General anesthesia CONTRAST:  Omnipaque 300 approximately 120 mL FLUOROSCOPY TIME:  Fluoroscopy Time: 20 minutes 20 seconds (1427 mGy). COMPLICATIONS: None immediate. TECHNIQUE: Following a full explanation of the procedure along with the  potential associated complications, an informed witnessed consent was obtained by the interpreter from his live-in girlfriend. The risks of intracranial hemorrhage of 10%, worsening neurological deficit, ventilator dependency, death and inability to revascularize were all reviewed in detail with the patient's girlfriend. The patient was then put under general anesthesia by the Department of Anesthesiology at [facility]. The right groin was prepped and draped in the usual sterile fashion. Thereafter using modified Seldinger technique, transfemoral access into the right common femoral artery was obtained without difficulty. Over a 0.035 inch guidewire an 8 French 25 cm Pinnacle sheath was inserted. Through this, and also over a 0.035 inch guidewire a 5 [place] select JB 1 catheter inside of an 087 balloon guide catheter was advanced to the aortic arch region and selectively positioned in the right common carotid artery. The guidewire, and select diagnostic catheter were removed. Good aspiration obtained from the hub of the balloon guide catheter in the distal right common carotid artery. An arteriogram was then performed centered intracranially and extracranially. FINDINGS: The right common carotid arteriogram demonstrates the right external carotid artery and its major branches to be widely patent. The right internal carotid artery at the bulb and distal to the bulb demonstrates a tapered angiographic appearance in its proximal 1/3 with complete occlusion with no filling defects and flap noted along the posterior wall proximally. No distal reconstitution is noted intracranially of the right ICA and cavernous and supraclinoid segments. PROCEDURE: Over a 0.014 inch standard Synchro micro guidewire with a J configuration, a combination of an 021 150 cm microcatheter inside of an 071 Zoom aspiration catheter was advanced without difficulty to the cavernous segment. The balloon guide catheter was now also advanced  without any resistance to the distal cervical right ICA. A gentle control arteriogram performed through the balloon guide demonstrated complete occlusion of the right internal carotid artery at the distal cavernous segment. No antegrade flow was noticeable from the proximal right ICA. The micro guidewire was then gently advanced and manipulated with a torque device into the right middle cerebral artery proximally and then into the superior division followed by the microcatheter. Guidewire was removed. Slow aspiration of blood was obtained at the microcatheter hub. A very gentle contrast injection through the microcatheter demonstrated the tip of the microcatheter to be safely positioned with slow flow antegrade clearance of contrast. A 4 mm x 40  mm Solitaire retrieval device was then advanced to the distal end of the microcatheter. The Zoom aspiration catheter was now advanced into the distal right M1 segment. The O ring on the delivery microcatheter was then loosened. The retrieval device was then deployed in the usual fashion without difficulty. The wire was now retrieved more proximally in the Zoom aspiration catheter. Thereafter, constant aspiration was applied at the hub of the Zoom aspiration catheter, and with 20 mL syringe at the hub of the balloon guide catheter for approximately 3 minutes. Thereafter, slow retrieval of the retrieval device, the microcatheter and the aspiration catheter was performed. Copious amounts of clot were seen in the canister of the aspiration device. Also noted was a piece of clot in the interstices of the retrieval device, and also in the [place]. Following reversal of flow arrest in the right internal carotid artery, with free aspiration of blood obtained at the hub, a gentle control arteriogram performed demonstrates free flow in the right internal carotid artery distally in the petrous, cavernous and supraclinoid segments. Noted was opacification of the right middle  cerebral artery and the right anterior cerebral artery distributions. Significant spasm was noted in the proximal right middle cerebral artery, and of the superior division of the right middle cerebral artery. These responded to 3 aliquots of 25 mcg of nitroglycerin intra-arterially. At this time, also noted was contrast overlying the right basal ganglia region on angiography. CT of the brain on the table demonstrated dense focal hyper density in the putamen and less prominent in the right caudate head representing contrast with blood. No change was seen in the patient's hemodynamic status at this time. A control arteriogram performed through the balloon guide in the right internal carotid artery continued to demonstrate excellent flow extra cranially and intracranially with now spasm seen in the supraclinoid right ICA, and persistent moderate spasm in the proximal right M1 segment in the superior divisions. The patient was given 5 mg of intra-arterial verapamil slowly. A repeat arteriogram performed approximately 5 minutes later demonstrated modest improvement in the spasm in the right middle cerebral artery. Repeat arteriograms at 20 minutes following the initial revascularization demonstrated significant improvement in the right middle cerebral artery and the right internal carotid artery vasospasm. There continued to be a TICI 3 revascularization. An exchange 0.014 inch Synchro micro guidewire was then advanced to the horizontal petrous segment. An arteriogram was then performed with the balloon guide catheter in the distal right common carotid artery. This continued to demonstrate excellent flow extra cranially and intracranially. No visible irregularity or an intimal flap was visible. Intracranially improvement is noted in the supraclinoid right ICA. No change was seen in the small focal outpouching in the right posterior communicating artery region suspicious for an aneurysm. A final control arteriogram  performed through the right common carotid artery demonstrated patency of the right internal carotid artery extra cranially and intracranially without evidence of intraluminal filling defects or spasm. The right middle cerebral artery continued to maintain a TICI 3 revascularization. The right anterior cerebral artery remained widely patent. The balloon guide was removed. The 8 French Pinnacle sheath was then replaced with an 8 French Angio-Seal closure device for hemostasis in the right groin. Distal pulses remained palpable in both feet unchanged. Patient was left intubated on account of his medical condition. CT of the brain performed continued to demonstrate no change in the hyperdensity in the right basal ganglia region. Patient was then transferred to neuro ICU for post revascularization management.  IMPRESSION: Status post endovascular complete revascularization of the right internal carotid artery terminus, the right middle cerebral artery, the right anterior cerebral artery, and the extracranial right internal carotid artery, with 1 pass with the 4 mm x 40 mm Solitaire retrieval device, with aspiration, and proximal flow arrest achieving a TICI 3 revascularization. 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 PLAN: Follow-up in the clinic 4 to 6 weeks post discharge. Electronically Signed   By: Julieanne Cotton M.D.   On: 07/04/2020 11:00    Assessment/Plan: Diagnosis: RightBG/caudate head infarct  Stroke: Continue secondary stroke prophylaxis and Risk Factor Modification listed below:   Antiplatelet therapy:   Blood Pressure Management:  Continue current medication with prn's with permisive HTN per primary team Statin Agent:   Left sided hemiparesis: fit for orthosis to prevent contractures (resting hand splint for day, wrist cock up splint at night, PRAFO, etc) PT/OT for mobility, ADL training  Labs independently  reviewed.  Records reviewed and summated above.  1. Does the need for close, 24 hr/day medical supervision in concert with the patient's rehab needs make it unreasonable for this patient to be served in a less intensive setting? Yes  2. Co-Morbidities requiring supervision/potential complications: lethargy, left inattention, proprioceptive deficits, history of R-SCA infarct with residual deficits, ABLA (repeat labs, consider transfusion if necessary to ensure appropriate perfusion for increased activity tolerance), encephalopathy, seizure prophylaxis (Keppra) 3. Due to bladder management, bowel management, safety, skin/wound care, disease management and patient education, does the patient require 24 hr/day rehab nursing? Yes 4. Does the patient require coordinated care of a physician, rehab nurse, therapy disciplines of PT/OT/SLP to address physical and functional deficits in the context of the above medical diagnosis(es)? Yes Addressing deficits in the following areas: balance, endurance, locomotion, strength, transferring, bathing, dressing, toileting, cognition, speech, swallowing and psychosocial support 5. Can the patient actively participate in an intensive therapy program of at least 3 hrs of therapy per day at least 5 days per week? Potentially 6. The potential for patient to make measurable gains while on inpatient rehab is excellent 7. Anticipated functional outcomes upon discharge from inpatient rehab are supervision and min assist  with PT, supervision and min assist with OT, modified independent with SLP. 8. Estimated rehab length of stay to reach the above functional goals is: 16-19 days. 9. Anticipated discharge destination: Home 10. Overall Rehab/Functional Prognosis: good  RECOMMENDATIONS: This patient's condition is appropriate for continued rehabilitative care in the following setting: CIR when medical work-up complete and able tolerate 3 hours of therapy per day. Patient has  agreed to participate in recommended program. Yes Note that insurance prior authorization may be required for reimbursement for recommended care.  Comment: Rehab Admissions Coordinator to follow up.  I have personally performed a face to face diagnostic evaluation, including, but not limited to relevant history and physical exam findings, of this patient and developed relevant assessment and plan.  Additionally, I have reviewed and concur with the physician assistant's documentation above.   Maryla Morrow, MD, ABPMR Jacquelynn Cree, PA-C 07/05/2020

## 2020-07-05 NOTE — Progress Notes (Signed)
  Speech Language Pathology Treatment: Dysphagia  Patient Details Name: Mark Oneal MRN: [id number] DOB: 11-03-1985 Today's Date: 07/05/2020 Time: 0254-2706 SLP Time Calculation (min) (ACUTE ONLY): 11 min  Assessment / Plan / Recommendation Clinical Impression  Pt is a little more alert this morning and does not have as much coughing, but there is still labial escape, pocketing, and throat clearing observed. He is having more difficulty sucking liquid from the straw despite cues and placement of the straw on his R side. It appears to be more of an initiation issue compared to a strength issue. Given persistent signs of dysphagia, would proceed with MBS, scheduled for later today.   HPI HPI: Mark Oneal is a 34 y.o. 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 Pt found to have occluded R ICA, MCA, and ACA requiring thrombectomy by IR. Pt also received tPA. ETT 10/18-10/19.      SLP Plan  MBS       Recommendations  Diet recommendations: NPO Medication Administration: Crushed with puree                Oral Care Recommendations: Oral care QID Follow up Recommendations: Inpatient Rehab SLP Visit Diagnosis: Dysphagia, unspecified (R13.10) Plan: MBS       GO                Mahala Menghini., M.A. CCC-SLP Acute Rehabilitation Services Pager [phone] Office [phone]  07/05/2020, 9:57 AM

## 2020-07-05 NOTE — Progress Notes (Signed)
STROKE TEAM PROGRESS NOTE   INTERVAL HISTORY RN at bedside, no family at the bedside.  Patient laying in bed, awake alert, orientated and interactive.  Still has left upper extremity weakness, left lower extremity weakness much improved.  Passed a swallow on diet with pure and nectar thick liquid.  Vitals:   07/05/20 0500 07/05/20 0600 07/05/20 0700 07/05/20 0740  BP: 130/71 (!) 150/82 129/74   Pulse: 65 63 68   Resp: (!) 21 (!) 21 (!) 21   Temp:    98.8 F (37.1 C)  TempSrc:    Axillary  SpO2: 97% 93% 96%   Weight:      Height:       CBC:  Recent Labs  Lab 07/03/20 1344 07/03/20 2047 07/04/20 0355 07/05/20 0323  WBC 10.5  --  16.1* 9.2  NEUTROABS 6.3  --   --   --   HGB 15.6   < > 14.2 12.6*  HCT 44.8   < > 40.3 37.2*  MCV 90.9  --  91.0 92.8  PLT 238  --  222 162   < > = values in this interval not displayed.   Basic Metabolic Panel:  Recent Labs  Lab 07/04/20 0355 07/05/20 0323  NA 135 140  K 4.0 3.8  CL 102 106  CO2 21* 25  GLUCOSE 139* 97  BUN 11 14  CREATININE 0.95 0.93  CALCIUM 8.4* 8.5*   Lipid Panel:  Recent Labs  Lab 07/04/20 0358  CHOL 293*  TRIG 823*  854*  HDL 29*  CHOLHDL 10.1  VLDL UNABLE TO CALCULATE IF TRIGLYCERIDE OVER 400 mg/dL  LDLCALC UNABLE TO CALCULATE IF TRIGLYCERIDE OVER 400 mg/dL   IWPY0D:  Recent Labs  Lab 07/04/20 0358  HGBA1C 5.6   Urine Drug Screen:  Recent Labs  Lab 07/03/20 1738  LABOPIA NONE DETECTED  COCAINSCRNUR NONE DETECTED  LABBENZ NONE DETECTED  AMPHETMU NONE DETECTED  THCU NONE DETECTED  LABBARB NONE DETECTED    Alcohol Level  Recent Labs  Lab 07/03/20 1344  ETH <10    IMAGING past 24 hours MR BRAIN WO CONTRAST  Addendum Date: 07/04/2020   ADDENDUM REPORT: 07/04/2020 14:19 ADDENDUM: These results were called by telephone at the time of interpretation on 07/04/2020 at 2:12 pm to provider Dr. Roda Shutters , who verbally acknowledged these results. Electronically Signed   By: Stana Bunting M.D.    On: 07/04/2020 14:19   Result Date: 07/04/2020 CLINICAL DATA:  Stroke, follow-up. EXAM: MRI HEAD WITHOUT CONTRAST TECHNIQUE: Multiplanar, multiecho pulse sequences of the brain and surrounding structures were obtained without intravenous contrast. COMPARISON:  07/03/2020 noncontrast head CT and CTA head and neck. Later same day 07/03/2020 noncontrast head CT. FINDINGS: Brain: Multifocal restricted diffusion involving the right frontoparietal, occipitotemporal lobes, right insula and right basal ganglia. Remote bilateral cerebellar and right midbrain insults. Right lentiform SWI signal dropout correlates with contrast seen on prior CT. Partial effacement the right lateral ventricle. No midline shift, ventriculomegaly or extra-axial fluid collection. No mass lesion. Vascular: Please see recent CTA. Skull and upper cervical spine: Normal marrow signal. Sinuses/Orbits: Normal orbits. Mild maxillary sinus mucosal thickening with layering ethmoid and sphenoid secretions. Clear mastoid air cells. Other: None. IMPRESSION: Multifocal infarcts involving the right cerebrum and right basal ganglia. Redemonstration of right lentiform contrast. Remote bilateral cerebellar and right midbrain insults. Electronically Signed: By: Stana Bunting M.D. 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 EEG to evaluate for seizure Level of alertness: Awake AEDs during EEG study: LEV Technical aspects: This EEG study was done with scalp electrodes positioned according to the 10-20 International system of electrode placement. Electrical activity was acquired at a  sampling rate of 500Hz  and reviewed with a high frequency filter of 70Hz  and a low frequency filter of 1Hz . EEG data were recorded continuously and digitally stored. Description: EEG showed continuous generalized and maximal right temporal region 2-3Hz  with overriding 13-14Hz  generalized beta activity. Hyperventilation and photic stimulation were not performed.   ABNORMALITY -Continuous slow, generalized IMPRESSION: This study is suggestive of severe diffuse encephalopathy, nonspecific etiology. No seizures or epileptiform discharges were seen throughout the recording.   ECHOCARDIOGRAM COMPLETE  Result Date: 07/04/2020    ECHOCARDIOGRAM REPORT   Patient Name:   Mark Oneal Date of Exam: 07/04/2020 Medical Rec #:  07/06/2020    Height:       64.0 in Accession #:    [name]   Weight:       199.1 lb Date of Birth:  May 20, 1986     BSA:          1.953 m Patient Age:    34 years     BP:           141/67 mmHg Patient Gender: M            HR:           91 bpm. Exam Location:  Inpatient Procedure: 2D Echo Indications:   434.91 stroke  History:       Patient has no prior history of Echocardiogram examinations.                Stroke.  Sonographer:   [id number] RDCS (AE) Referring      [id number] [facility] Phys:  Sonographer Comments: Restricted mobility. poor patient compliance (unable to remain still at times) IMPRESSIONS  1. Left ventricular ejection fraction, by estimation, is 60 to 65%. The left ventricle has normal function. The left ventricle has no regional wall motion abnormalities. Left ventricular diastolic parameters were normal.  2. Right ventricular systolic function is normal. The right ventricular size is normal.  3. The mitral valve is grossly normal. No evidence of mitral valve regurgitation.  4. The aortic valve was not well visualized. Aortic valve regurgitation is not visualized. Comparison(s): No prior Echocardiogram. FINDINGS  Left Ventricle: Left ventricular ejection  fraction, by estimation, is 60 to 65%. The left ventricle has normal function. The left ventricle has no regional wall motion abnormalities. The left ventricular internal cavity size was normal in size. There is  no left ventricular hypertrophy. Left ventricular diastolic parameters were normal. Right Ventricle: The right ventricular size is normal. Right vetricular wall thickness was not well visualized. Right ventricular systolic function is normal. Left Atrium: Left atrial size was normal in size. Right Atrium: Right atrial size was normal in size. Pericardium: There is no evidence of pericardial effusion. Mitral Valve: The mitral valve is grossly normal. No evidence of mitral valve regurgitation. Tricuspid Valve: The tricuspid valve is grossly normal. Tricuspid valve regurgitation is not demonstrated. Aortic Valve: The aortic valve was not well visualized. Aortic valve  regurgitation is not visualized. Pulmonic Valve: The pulmonic valve was grossly normal. Pulmonic valve regurgitation is not visualized. Aorta: The aortic root is normal in size and structure. IAS/Shunts: The interatrial septum was not assessed.  LEFT VENTRICLE PLAX 2D LVIDd:         4.50 cm  Diastology LVIDs:         2.40 cm  LV e' medial:    10.70 cm/s LV PW:         0.90 cm  LV E/e' medial:  10.4 LV IVS:        1.10 cm  LV e' lateral:   15.90 cm/s LVOT diam:     2.30 cm  LV E/e' lateral: 7.0 LV SV:         79 LV SV Index:   40 LVOT Area:     4.15 cm  RIGHT VENTRICLE RV S prime:     15.30 cm/s TAPSE (M-mode): 1.7 cm LEFT ATRIUM             Index LA diam:        3.00 cm 1.54 cm/m LA Vol (A2C):   29.6 ml 15.16 ml/m LA Vol (A4C):   32.9 ml 16.85 ml/m LA Biplane Vol: 31.4 ml 16.08 ml/m  AORTIC VALVE LVOT Vmax:   111.00 cm/s LVOT Vmean:  74.900 cm/s LVOT VTI:    0.189 m  AORTA Ao Root diam: 2.80 cm MITRAL VALVE MV Area (PHT): 5.75 cm     SHUNTS MV Decel Time: 132 msec     Systemic VTI:  0.19 m MV E velocity: 111.00 cm/s  Systemic Diam: 2.30 cm  MV A velocity: 67.30 cm/s MV E/A ratio:  1.65 Riley LamMahesh Chandrasekhar MD Electronically signed by Riley LamMahesh Chandrasekhar MD Signature Date/Time: 07/04/2020/2:33:40 PM    Final    VAS US LOWER EXTREMITY VENOUS (DVT)  Result Date: 07/04/2020  Lower Venous DVTStudy Indications: Edema.  Risk Factors: Immobility. Performing Technologist: Jannet AskewVernon Matacale RCT RDMS  Examination Guidelines: A complete evaluation includes B-mode imaging, spectral Doppler, color Doppler, and power Doppler as needed of all accessible portions of each vessel. Bilateral testing is considered an integral part of a complete examination. Limited examinations for reoccurring indications may be performed as noted. The reflux portion of the exam is performed with the patient in reverse Trendelenburg.  +---------+---------------+---------+-----------+----------+--------------+ RIGHT    CompressibilityPhasicitySpontaneityPropertiesThrombus Aging +---------+---------------+---------+-----------+----------+--------------+ CFV      Full           Yes      Yes                                 +---------+---------------+---------+-----------+----------+--------------+ SFJ      Full                                                        +---------+---------------+---------+-----------+----------+--------------+ FV Prox  Full                                                        +---------+---------------+---------+-----------+----------+--------------+ FV Mid   Full                                                        +---------+---------------+---------+-----------+----------+--------------+  FV DistalFull                                                        +---------+---------------+---------+-----------+----------+--------------+ PFV      Full                                                        +---------+---------------+---------+-----------+----------+--------------+ POP      Full           Yes       Yes                                 +---------+---------------+---------+-----------+----------+--------------+ PTV      Full                                                        +---------+---------------+---------+-----------+----------+--------------+ PERO     Full                                                        +---------+---------------+---------+-----------+----------+--------------+   +---------+---------------+---------+-----------+----------+--------------+ LEFT     CompressibilityPhasicitySpontaneityPropertiesThrombus Aging +---------+---------------+---------+-----------+----------+--------------+ CFV      Full           Yes      Yes                                 +---------+---------------+---------+-----------+----------+--------------+ SFJ      Full                                                        +---------+---------------+---------+-----------+----------+--------------+ FV Prox  Full                                                        +---------+---------------+---------+-----------+----------+--------------+ FV Mid   Full                                                        +---------+---------------+---------+-----------+----------+--------------+ FV DistalFull                                                        +---------+---------------+---------+-----------+----------+--------------+  PFV      Full                                                        +---------+---------------+---------+-----------+----------+--------------+ POP      Full           Yes      Yes                                 +---------+---------------+---------+-----------+----------+--------------+ PTV      Full                                                        +---------+---------------+---------+-----------+----------+--------------+ PERO     Full                                                         +---------+---------------+---------+-----------+----------+--------------+     Summary: RIGHT: - There is no evidence of deep vein thrombosis in the lower extremity.  - No cystic structure found in the popliteal fossa.  LEFT: - There is no evidence of deep vein thrombosis in the lower extremity.  - No cystic structure found in the popliteal fossa.  *See table(s) above for measurements and observations. Electronically signed by Gretta Began MD on 07/04/2020 at 4:25:09 PM.    Final     PHYSICAL EXAM  Temp:  [98.5 F (36.9 C)-99 F (37.2 C)] 98.8 F (37.1 C) (10/20 0740) Pulse Rate:  [63-105] 68 (10/20 0700) Resp:  [15-21] 21 (10/20 0700) BP: (115-150)/(63-82) 129/74 (10/20 0700) SpO2:  [93 %-100 %] 96 % (10/20 0700) Arterial Line BP: (100-110)/(75-80) 110/80 (10/19 1000) FiO2 (%):  [28 %] 28 % (10/19 0838)  General - Well nourished, well developed, not in acute distress.  Ophthalmologic - fundi not visualized due to noncooperation.  Cardiovascular - Regular rhythm and rate.  Neuro - awake alert eyes open. Orientated to place, time and age. Paucity of speech, able to name 2/3 and repeat in dysarthric voice. Mild to moderate dysarthria. Following simple commands. Visual field full, able to have bilateral gaze. Left facial droop, tongue protrusion to the left. Left UE proximal 0/5, 2/5 bicep, 1/5 tricep, 0/5 finger movement. LLE 4/5 proximal and distal. RUE and RLE 5/5. Sensation subjectively symmetrical. RUE FTN intact. Gait not tested.    ASSESSMENT/PLAN [name] is a 34 y.o. 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 Found to have L sided weakness w/ neglect and R gaze on arrival to ED. CTH w/ hyperdense R MCA w/ w/ cervical R ICA occlusion and ASPECTS 8. Received IV tPA 07/03/2020 at 1356 and sent to IR.  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 Suspect R MCA thrombus w/ acute infarct. ASPECTS 8.    CTA head & neck cervical R ICA occlusion 2cm above origin w/ reconstitution and subsequent occlusion proximal R M1 extending into bifurcation   Cerebral angio / IR extracranial R ICA/terminus, R MCA occlusion w/ TICI3 revascularization. Possible R ICA dissection  Repeat CT head dense contrast staining posterior R lentiform (no hemorrhage). New/increased edema R basal ganglia, R insula, anterior temporal lobe w/ mild mass effect R lateral ventricle. 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  Therapy recommendations:  CIR   Disposition:  pending   Seizure  Witnessed at stroke onset  Seizure in CT  Treated w/ ativan  Loaded w/ Keppra 1500  Now on Keppra 500 q 12h  EEG no sz, slowing   Hx stroke/TIA  R SCA cerebellar stroke in [place] - 05/2019  Etiology not sure  Acute Respiratory Failure  Secondary to stroke  Intubated for IR, left intubated post IR per anesthesia  Extubated 10/19 w/o difficulty  CCM on board   Hyperlipidemia  Home meds:  No statin  TG 823->pending, direct LDL 151, goal < 70  On lipitor 80    Continue statin at discharge  Dysphagia   . Secondary to stroke . Has a swallow after modified barium study . Pure diet with nectar thick liquid . Speech on board   Other Stroke Risk Factors  Obesity, Body mass index is 34.17 kg/m., recommend weight loss, diet and exercise as appropriate   Other Active Problems  Leukocytosis WBC  16.1->9.2 (afebrile)  Hospital day # 2  This patient is critically ill due to right MCA stroke due to right ICA occlusion and right MCA occlusion status post TPA and thrombectomy, seizure at onset, dysphagia and at significant risk of neurological worsening, death form recurrent stroke, hemorrhagic conversion, status epilepticus, aspiration pneumonia. This patient's care requires constant monitoring of vital signs, hemodynamics, respiratory and cardiac monitoring, review of multiple databases, neurological assessment, discussion with family, other specialists and medical decision making of high complexity. I spent 30 minutes of neurocritical care time in the care of this patient.  Malayasia Mirkin, MD PhD Stroke Neurology 07/05/2020 4:47 PM     To contact Stroke Continuity provider, please refer to [email or web address]. After hours, contact General Neurology 

## 2020-07-05 NOTE — Progress Notes (Signed)
Referring Physician(s): Code stroke- Erick Blinks (neurology)  Supervising Physician: Julieanne Cotton  Patient Status:  [facility] - In-pt  Chief Complaint: None  Subjective:  History of acute CVA s/p cerebral arteriogram with emergent mechanical thrombectomy of right ICA (extracranially and terminus), right MCA, and right ACA occlusions achieving a TICI 3 revascularization via right femoral approach 07/03/2020 by Dr. Corliss Skains. Patient awake and alert laying in bed. He follows simple commands, speaks [place] fairly well. Can spontaneously move right side and LLE with weakness of LLE, no movements of LUE. Right femoral puncture site c/d/i.   Allergies: Patient has no known allergies.  Medications: Prior to Admission medications   Medication Sig Start Date End Date Taking? Authorizing Provider  aspirin EC 81 MG tablet Take 81 mg by mouth daily. Swallow whole.   Yes [provider]     Vital Signs: BP 129/74   Pulse 68   Temp 98.8 F (37.1 C) (Axillary)   Resp (!) 21   Ht 5\' 4"  (1.626 m)   Wt 199 lb 1.2 oz (90.3 kg)   SpO2 96%   BMI 34.17 kg/m   Physical Exam Vitals and nursing note reviewed.  Constitutional:      General: He is not in acute distress. Pulmonary:     Effort: Pulmonary effort is normal. No respiratory distress.  Skin:    General: Skin is warm and dry.     Comments: Right femoral puncture site soft without active bleeding or hematoma.   Neurological:     Mental Status: He is alert.     Comments: Alert, awake, and oriented x3. He follows simple commands. Speech dysarthric. PERRL bilaterally. EOMs intact bilaterally without nystagmus or subjective diplopia. Tongue midline. Can spontaneously move right side and LLE with weakness of LLE, no movements of LUE. LLE pronator drift.     Imaging: CT HEAD WO CONTRAST  Result Date: 07/03/2020 CLINICAL DATA:  34 year old male code stroke presentation left side weakness this afternoon  positive for right ICA occlusion in the neck, right M1 occlusion. Initial ASPECTS 8. Treated with endovascular revascularization 1500 hours today. EXAM: CT HEAD WITHOUT CONTRAST TECHNIQUE: Contiguous axial images were obtained from the base of the skull through the vertex without intravenous contrast. COMPARISON:  Presentation head CT 1350 hours. FINDINGS: Brain: Stable chronic appearing cerebellar infarcts, including a relatively large right SCA territory infarct. Contrast staining in the posterior lentiform nuclei. Superimposed cytotoxic edema in the right caudate and anterior lentiform, new from the presentation head CT. Mild associated mass effect on the right lateral ventricle, especially the frontal horn. Similar cytotoxic edema at the right insula and external capsule, some of which was present initially. Cytotoxic edema also again suspected at the anterior temporal tip. No acute intracranial hemorrhage identified. ASPECTS MCA segments 2 through 6 appear to remain normal. Contralateral left hemisphere gray-white matter differentiation is stable. No midline shift. Normal basilar cisterns. Vascular: Small volume residual intravascular contrast. Skull: Negative. Sinuses/Orbits: Visualized paranasal sinuses and mastoids are stable and well pneumatized. Other: No acute orbit or scalp soft tissue finding. Intubated. Small volume retained secretions in the visible pharynx. IMPRESSION: 1. No acute intracranial hemorrhage identified. Dense contrast staining of the posterior right lentiform, where early cytotoxic edema was demonstrated on the presentation scan. 2. New and/or increased cytotoxic edema in the remaining right basal ganglia, right insula, anterior temporal lobe. Mild mass effect on the right lateral ventricle with no midline shift. 3. Stable right greater than left cerebellar infarcts, likely chronic. Electronically  Signed   By: Odessa Fleming M.D.   On: 07/03/2020 23:48   MR BRAIN WO CONTRAST  Addendum Date:  07/04/2020   ADDENDUM REPORT: 07/04/2020 14:19 ADDENDUM: These results were called by telephone at the time of interpretation on 07/04/2020 at 2:12 pm to provider Dr. Roda Shutters , who verbally acknowledged these results. Electronically Signed   By: Stana Bunting M.D.   On: 07/04/2020 14:19   Result Date: 07/04/2020 CLINICAL DATA:  Stroke, follow-up. EXAM: MRI HEAD WITHOUT CONTRAST TECHNIQUE: Multiplanar, multiecho pulse sequences of the brain and surrounding structures were obtained without intravenous contrast. COMPARISON:  07/03/2020 noncontrast head CT and CTA head and neck. Later same day 07/03/2020 noncontrast head CT. FINDINGS: Brain: Multifocal restricted diffusion involving the right frontoparietal, occipitotemporal lobes, right insula and right basal ganglia. Remote bilateral cerebellar and right midbrain insults. Right lentiform SWI signal dropout correlates with contrast seen on prior CT. Partial effacement the right lateral ventricle. No midline shift, ventriculomegaly or extra-axial fluid collection. No mass lesion. Vascular: Please see recent CTA. Skull and upper cervical spine: Normal marrow signal. Sinuses/Orbits: Normal orbits. Mild maxillary sinus mucosal thickening with layering ethmoid and sphenoid secretions. Clear mastoid air cells. Other: None. IMPRESSION: Multifocal infarcts involving the right cerebrum and right basal ganglia. Redemonstration of right lentiform contrast. Remote bilateral cerebellar and right midbrain insults. Electronically Signed: By: Stana Bunting M.D. On: 07/04/2020 14:04   IR CT Head Ltd  Result Date: 07/05/2020 INDICATION: Acute onset of seizures. Left-sided neglect and left-sided weakness with right gaze deviation. 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 EXAM: 1. EMERGENT LARGE VESSEL OCCLUSION THROMBOLYSIS (anterior CIRCULATION) COMPARISON:  CT angiogram of the head and neck of July 03, 2020. MEDICATIONS: Ancef 2 g IV  antibiotic was administered within 1 hour of the procedure. ANESTHESIA/SEDATION: General anesthesia CONTRAST:  Omnipaque 300 approximately 120 mL FLUOROSCOPY TIME:  Fluoroscopy Time: 20 minutes 20 seconds (1427 mGy). COMPLICATIONS: None immediate. TECHNIQUE: Following a full explanation of the procedure along with the potential associated complications, an informed witnessed consent was obtained by the interpreter from his live-in girlfriend. The risks of intracranial hemorrhage of 10%, worsening neurological deficit, ventilator dependency, death and inability to revascularize were all reviewed in detail with the patient's girlfriend. The patient was then put under general anesthesia by the Department of Anesthesiology at [facility]. The right groin was prepped and draped in the usual sterile fashion. Thereafter using modified Seldinger technique, transfemoral access into the right common femoral artery was obtained without difficulty. Over a 0.035 inch guidewire an 8 French 25 cm Pinnacle sheath was inserted. Through this, and also over a 0.035 inch guidewire a 5 [place] select JB 1 catheter inside of an 087 balloon guide catheter was advanced to the aortic arch region and selectively positioned in the right common carotid artery. The guidewire, and select diagnostic catheter were removed. Good aspiration obtained from the hub of the balloon guide catheter in the distal right common carotid artery. An arteriogram was then performed centered intracranially and extracranially. FINDINGS: The right common carotid arteriogram demonstrates the right external carotid artery and its major branches to be widely patent. The right internal carotid artery at the bulb and distal to the bulb demonstrates a tapered angiographic appearance in its proximal 1/3 with complete occlusion with no filling defects and flap noted along the posterior wall proximally. No distal reconstitution is noted intracranially of the right ICA  and cavernous and supraclinoid segments. PROCEDURE: Over a 0.014  inch standard Synchro micro guidewire with a J configuration, a combination of an 021 150 cm microcatheter inside of an 071 Zoom aspiration catheter was advanced without difficulty to the cavernous segment. The balloon guide catheter was now also advanced without any resistance to the distal cervical right ICA. A gentle control arteriogram performed through the balloon guide demonstrated complete occlusion of the right internal carotid artery at the distal cavernous segment. No antegrade flow was noticeable from the proximal right ICA. The micro guidewire was then gently advanced and manipulated with a torque device into the right middle cerebral artery proximally and then into the superior division followed by the microcatheter. Guidewire was removed. Slow aspiration of blood was obtained at the microcatheter hub. A very gentle contrast injection through the microcatheter demonstrated the tip of the microcatheter to be safely positioned with slow flow antegrade clearance of contrast. A 4 mm x 40 mm Solitaire retrieval device was then advanced to the distal end of the microcatheter. The Zoom aspiration catheter was now advanced into the distal right M1 segment. The O ring on the delivery microcatheter was then loosened. The retrieval device was then deployed in the usual fashion without difficulty. The wire was now retrieved more proximally in the Zoom aspiration catheter. Thereafter, constant aspiration was applied at the hub of the Zoom aspiration catheter, and with 20 mL syringe at the hub of the balloon guide catheter for approximately 3 minutes. Thereafter, slow retrieval of the retrieval device, the microcatheter and the aspiration catheter was performed. Copious amounts of clot were seen in the canister of the aspiration device. Also noted was a piece of clot in the interstices of the retrieval device, and also in the Tuohy [place]. Following  reversal of flow arrest in the right internal carotid artery, with free aspiration of blood obtained at the hub, a gentle control arteriogram performed demonstrates free flow in the right internal carotid artery distally in the petrous, cavernous and supraclinoid segments. Noted was opacification of the right middle cerebral artery and the right anterior cerebral artery distributions. Significant spasm was noted in the proximal right middle cerebral artery, and of the superior division of the right middle cerebral artery. These responded to 3 aliquots of 25 mcg of nitroglycerin intra-arterially. At this time, also noted was contrast overlying the right basal ganglia region on angiography. CT of the brain on the table demonstrated dense focal hyper density in the putamen and less prominent in the right caudate head representing contrast with blood. No change was seen in the patient's hemodynamic status at this time. A control arteriogram performed through the balloon guide in the right internal carotid artery continued to demonstrate excellent flow extra cranially and intracranially with now spasm seen in the supraclinoid right ICA, and persistent moderate spasm in the proximal right M1 segment in the superior divisions. The patient was given 5 mg of intra-arterial verapamil slowly. A repeat arteriogram performed approximately 5 minutes later demonstrated modest improvement in the spasm in the right middle cerebral artery. Repeat arteriograms at 20 minutes following the initial revascularization demonstrated significant improvement in the right middle cerebral artery and the right internal carotid artery vasospasm. There continued to be a TICI 3 revascularization. An exchange 0.014 inch Synchro micro guidewire was then advanced to the horizontal petrous segment. An arteriogram was then performed with the balloon guide catheter in the distal right common carotid artery. This continued to demonstrate excellent flow  extra cranially and intracranially. No visible irregularity or an intimal flap was  visible. Intracranially improvement is noted in the supraclinoid right ICA. No change was seen in the small focal outpouching in the right posterior communicating artery region suspicious for an aneurysm. A final control arteriogram performed through the right common carotid artery demonstrated patency of the right internal carotid artery extra cranially and intracranially without evidence of intraluminal filling defects or spasm. The right middle cerebral artery continued to maintain a TICI 3 revascularization. The right anterior cerebral artery remained widely patent. The balloon guide was removed. The 8 French Pinnacle sheath was then replaced with an 8 French Angio-Seal closure device for hemostasis in the right groin. Distal pulses remained palpable in both feet unchanged. Patient was left intubated on account of his medical condition. CT of the brain performed continued to demonstrate no change in the hyperdensity in the right basal ganglia region. Patient was then transferred to neuro ICU for post revascularization management. IMPRESSION: Status post endovascular complete revascularization of the right internal carotid artery terminus, the right middle cerebral artery, the right anterior cerebral artery, and the extracranial right internal carotid artery, with 1 pass with the 4 mm x 40 mm Solitaire retrieval device, with aspiration, and proximal flow arrest achieving a TICI 3 revascularization. 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 PLAN: Follow-up in the clinic 4 to 6 weeks post discharge. Electronically Signed   By: Julieanne Cotton M.D.   On: 07/04/2020 11:00   IR CT Head Ltd  Result Date: 07/05/2020 INDICATION: Acute onset of seizures. Left-sided neglect and left-sided weakness with right gaze deviation. Occluded right internal carotid  artery at the terminus, and in the neck. EXAM: 1. EMERGENT LARGE VESSEL OCCLUSION THROMBOLYSIS (anterior CIRCULATION) COMPARISON:  CT angiogram of the head and neck of July 03, 2020. MEDICATIONS: Ancef 2 g IV antibiotic was administered within 1 hour of the procedure. ANESTHESIA/SEDATION: General anesthesia CONTRAST:  Omnipaque 300 approximately 120 mL FLUOROSCOPY TIME:  Fluoroscopy Time: 20 minutes 20 seconds (1427 mGy). COMPLICATIONS: None immediate. TECHNIQUE: Following a full explanation of the procedure along with the potential associated complications, an informed witnessed consent was obtained by the interpreter from his live-in girlfriend. The risks of intracranial hemorrhage of 10%, worsening neurological deficit, ventilator dependency, death and inability to revascularize were all reviewed in detail with the patient's girlfriend. The patient was then put under general anesthesia by the Department of Anesthesiology at [facility]. The right groin was prepped and draped in the usual sterile fashion. Thereafter using modified Seldinger technique, transfemoral access into the right common femoral artery was obtained without difficulty. Over a 0.035 inch guidewire an 8 French 25 cm Pinnacle sheath was inserted. Through this, and also over a 0.035 inch guidewire a 5 [place] select JB 1 catheter inside of an 087 balloon guide catheter was advanced to the aortic arch region and selectively positioned in the right common carotid artery. The guidewire, and select diagnostic catheter were removed. Good aspiration obtained from the hub of the balloon guide catheter in the distal right common carotid artery. An arteriogram was then performed centered intracranially and extracranially. FINDINGS: The right common carotid arteriogram demonstrates the right external carotid artery and its major branches to be widely patent. The right internal carotid artery at the bulb and distal to the bulb demonstrates a  tapered angiographic appearance in its proximal 1/3 with complete occlusion with no filling defects and flap noted along the posterior wall proximally. No distal reconstitution is noted intracranially  of the right ICA and cavernous and supraclinoid segments. PROCEDURE: Over a 0.014 inch standard Synchro micro guidewire with a J configuration, a combination of an 021 150 cm microcatheter inside of an 071 Zoom aspiration catheter was advanced without difficulty to the cavernous segment. The balloon guide catheter was now also advanced without any resistance to the distal cervical right ICA. A gentle control arteriogram performed through the balloon guide demonstrated complete occlusion of the right internal carotid artery at the distal cavernous segment. No antegrade flow was noticeable from the proximal right ICA. The micro guidewire was then gently advanced and manipulated with a torque device into the right middle cerebral artery proximally and then into the superior division followed by the microcatheter. Guidewire was removed. Slow aspiration of blood was obtained at the microcatheter hub. A very gentle contrast injection through the microcatheter demonstrated the tip of the microcatheter to be safely positioned with slow flow antegrade clearance of contrast. A 4 mm x 40 mm Solitaire retrieval device was then advanced to the distal end of the microcatheter. The Zoom aspiration catheter was now advanced into the distal right M1 segment. The O ring on the delivery microcatheter was then loosened. The retrieval device was then deployed in the usual fashion without difficulty. The wire was now retrieved more proximally in the Zoom aspiration catheter. Thereafter, constant aspiration was applied at the hub of the Zoom aspiration catheter, and with 20 mL syringe at the hub of the balloon guide catheter for approximately 3 minutes. Thereafter, slow retrieval of the retrieval device, the microcatheter and the aspiration  catheter was performed. Copious amounts of clot were seen in the canister of the aspiration device. Also noted was a piece of clot in the interstices of the retrieval device, and also in the Tuohy [place]. Following reversal of flow arrest in the right internal carotid artery, with free aspiration of blood obtained at the hub, a gentle control arteriogram performed demonstrates free flow in the right internal carotid artery distally in the petrous, cavernous and supraclinoid segments. Noted was opacification of the right middle cerebral artery and the right anterior cerebral artery distributions. Significant spasm was noted in the proximal right middle cerebral artery, and of the superior division of the right middle cerebral artery. These responded to 3 aliquots of 25 mcg of nitroglycerin intra-arterially. At this time, also noted was contrast overlying the right basal ganglia region on angiography. CT of the brain on the table demonstrated dense focal hyper density in the putamen and less prominent in the right caudate head representing contrast with blood. No change was seen in the patient's hemodynamic status at this time. A control arteriogram performed through the balloon guide in the right internal carotid artery continued to demonstrate excellent flow extra cranially and intracranially with now spasm seen in the supraclinoid right ICA, and persistent moderate spasm in the proximal right M1 segment in the superior divisions. The patient was given 5 mg of intra-arterial verapamil slowly. A repeat arteriogram performed approximately 5 minutes later demonstrated modest improvement in the spasm in the right middle cerebral artery. Repeat arteriograms at 20 minutes following the initial revascularization demonstrated significant improvement in the right middle cerebral artery and the right internal carotid artery vasospasm. There continued to be a TICI 3 revascularization. An exchange 0.014 inch Synchro micro  guidewire was then advanced to the horizontal petrous segment. An arteriogram was then performed with the balloon guide catheter in the distal right common carotid artery. This continued to demonstrate excellent  flow extra cranially and intracranially. No visible irregularity or an intimal flap was visible. Intracranially improvement is noted in the supraclinoid right ICA. No change was seen in the small focal outpouching in the right posterior communicating artery region suspicious for an aneurysm. A final control arteriogram performed through the right common carotid artery demonstrated patency of the right internal carotid artery extra cranially and intracranially without evidence of intraluminal filling defects or spasm. The right middle cerebral artery continued to maintain a TICI 3 revascularization. The right anterior cerebral artery remained widely patent. The balloon guide was removed. The 8 French Pinnacle sheath was then replaced with an 8 French Angio-Seal closure device for hemostasis in the right groin. Distal pulses remained palpable in both feet unchanged. Patient was left intubated on account of his medical condition. CT of the brain performed continued to demonstrate no change in the hyperdensity in the right basal ganglia region. Patient was then transferred to neuro ICU for post revascularization management. IMPRESSION: Status post endovascular complete revascularization of the right internal carotid artery terminus, the right middle cerebral artery, the right anterior cerebral artery, and the extracranial right internal carotid artery, with 1 pass with the 4 mm x 40 mm Solitaire retrieval device, with aspiration, and proximal flow arrest achieving a TICI 3 revascularization. 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 PLAN: Follow-up in the clinic 4 to 6 weeks post discharge. Electronically Signed   By: Julieanne Cotton M.D.   On: 07/04/2020 11:00   IR US Guide Vasc Access Right  Result Date: 07/05/2020 INDICATION: Acute onset of seizures. Left-sided neglect and left-sided weakness with right gaze deviation. 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 EXAM: 1. EMERGENT LARGE VESSEL OCCLUSION THROMBOLYSIS (anterior CIRCULATION) COMPARISON:  CT angiogram of the head and neck of July 03, 2020. MEDICATIONS: Ancef 2 g IV antibiotic was administered within 1 hour of the procedure. ANESTHESIA/SEDATION: General anesthesia CONTRAST:  Omnipaque 300 approximately 120 mL FLUOROSCOPY TIME:  Fluoroscopy Time: 20 minutes 20 seconds (1427 mGy). COMPLICATIONS: None immediate. TECHNIQUE: Following a full explanation of the procedure along with the potential associated complications, an informed witnessed consent was obtained by the interpreter from his live-in girlfriend. The risks of intracranial hemorrhage of 10%, worsening neurological deficit, ventilator dependency, death and inability to revascularize were all reviewed in detail with the patient's girlfriend. The patient was then put under general anesthesia by the Department of Anesthesiology at [facility]. The right groin was prepped and draped in the usual sterile fashion. Thereafter using modified Seldinger technique, transfemoral access into the right common femoral artery was obtained without difficulty. Over a 0.035 inch guidewire an 8 French 25 cm Pinnacle sheath was inserted. Through this, and also over a 0.035 inch guidewire a 5 [place] select JB 1 catheter inside of an 087 balloon guide catheter was advanced to the aortic arch region and selectively positioned in the right common carotid artery. The guidewire, and select diagnostic catheter were removed. Good aspiration obtained from the hub of the balloon guide catheter in the distal right common carotid artery. An arteriogram was then performed centered intracranially and  extracranially. FINDINGS: The right common carotid arteriogram demonstrates the right external carotid artery and its major branches to be widely patent. The right internal carotid artery at the bulb and distal to the bulb demonstrates a tapered angiographic appearance in its proximal 1/3 with complete occlusion with no filling  defects and flap noted along the posterior wall proximally. No distal reconstitution is noted intracranially of the right ICA and cavernous and supraclinoid segments. PROCEDURE: Over a 0.014 inch standard Synchro micro guidewire with a J configuration, a combination of an 021 150 cm microcatheter inside of an 071 Zoom aspiration catheter was advanced without difficulty to the cavernous segment. The balloon guide catheter was now also advanced without any resistance to the distal cervical right ICA. A gentle control arteriogram performed through the balloon guide demonstrated complete occlusion of the right internal carotid artery at the distal cavernous segment. No antegrade flow was noticeable from the proximal right ICA. The micro guidewire was then gently advanced and manipulated with a torque device into the right middle cerebral artery proximally and then into the superior division followed by the microcatheter. Guidewire was removed. Slow aspiration of blood was obtained at the microcatheter hub. A very gentle contrast injection through the microcatheter demonstrated the tip of the microcatheter to be safely positioned with slow flow antegrade clearance of contrast. A 4 mm x 40 mm Solitaire retrieval device was then advanced to the distal end of the microcatheter. The Zoom aspiration catheter was now advanced into the distal right M1 segment. The O ring on the delivery microcatheter was then loosened. The retrieval device was then deployed in the usual fashion without difficulty. The wire was now retrieved more proximally in the Zoom aspiration catheter. Thereafter, constant aspiration  was applied at the hub of the Zoom aspiration catheter, and with 20 mL syringe at the hub of the balloon guide catheter for approximately 3 minutes. Thereafter, slow retrieval of the retrieval device, the microcatheter and the aspiration catheter was performed. Copious amounts of clot were seen in the canister of the aspiration device. Also noted was a piece of clot in the interstices of the retrieval device, and also in the Tuohy [place]. Following reversal of flow arrest in the right internal carotid artery, with free aspiration of blood obtained at the hub, a gentle control arteriogram performed demonstrates free flow in the right internal carotid artery distally in the petrous, cavernous and supraclinoid segments. Noted was opacification of the right middle cerebral artery and the right anterior cerebral artery distributions. Significant spasm was noted in the proximal right middle cerebral artery, and of the superior division of the right middle cerebral artery. These responded to 3 aliquots of 25 mcg of nitroglycerin intra-arterially. At this time, also noted was contrast overlying the right basal ganglia region on angiography. CT of the brain on the table demonstrated dense focal hyper density in the putamen and less prominent in the right caudate head representing contrast with blood. No change was seen in the patient's hemodynamic status at this time. A control arteriogram performed through the balloon guide in the right internal carotid artery continued to demonstrate excellent flow extra cranially and intracranially with now spasm seen in the supraclinoid right ICA, and persistent moderate spasm in the proximal right M1 segment in the superior divisions. The patient was given 5 mg of intra-arterial verapamil slowly. A repeat arteriogram performed approximately 5 minutes later demonstrated modest improvement in the spasm in the right middle cerebral artery. Repeat arteriograms at 20 minutes following the  initial revascularization demonstrated significant improvement in the right middle cerebral artery and the right internal carotid artery vasospasm. There continued to be a TICI 3 revascularization. An exchange 0.014 inch Synchro micro guidewire was then advanced to the horizontal petrous segment. An arteriogram was then performed with the  balloon guide catheter in the distal right common carotid artery. This continued to demonstrate excellent flow extra cranially and intracranially. No visible irregularity or an intimal flap was visible. Intracranially improvement is noted in the supraclinoid right ICA. No change was seen in the small focal outpouching in the right posterior communicating artery region suspicious for an aneurysm. A final control arteriogram performed through the right common carotid artery demonstrated patency of the right internal carotid artery extra cranially and intracranially without evidence of intraluminal filling defects or spasm. The right middle cerebral artery continued to maintain a TICI 3 revascularization. The right anterior cerebral artery remained widely patent. The balloon guide was removed. The 8 French Pinnacle sheath was then replaced with an 8 French Angio-Seal closure device for hemostasis in the right groin. Distal pulses remained palpable in both feet unchanged. Patient was left intubated on account of his medical condition. CT of the brain performed continued to demonstrate no change in the hyperdensity in the right basal ganglia region. Patient was then transferred to neuro ICU for post revascularization management. IMPRESSION: Status post endovascular complete revascularization of the right internal carotid artery terminus, the right middle cerebral artery, the right anterior cerebral artery, and the extracranial right internal carotid artery, with 1 pass with the 4 mm x 40 mm Solitaire retrieval device, with aspiration, and proximal flow arrest achieving a TICI 3  revascularization. 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 PLAN: Follow-up in the clinic 4 to 6 weeks post discharge. Electronically Signed   By: Julieanne CottonSanjeev  Deveshwar M.D.   On: 07/04/2020 11:00   Portable Chest xray  Result Date: 07/04/2020 CLINICAL DATA:  Intubation. EXAM: PORTABLE CHEST 1 VIEW COMPARISON:  07/03/2020. FINDINGS: Endotracheal tube tip 3.7 cm above the carina. Mediastinum hilar structures appear stable. Heart size appears stable. Low lung volumes with bilateral subsegmental atelectasis. No pleural effusion or pneumothorax. IMPRESSION: 1. Endotracheal tube tip 3.7 cm above the carina. 2. Low lung volumes with bilateral subsegmental atelectasis. Electronically Signed   By: Maisie Fushomas  Register   On: 07/04/2020 06:50   Portable Chest x-ray  Result Date: 07/03/2020 CLINICAL DATA:  Respiratory failure. EXAM: PORTABLE CHEST 1 VIEW COMPARISON:  None. FINDINGS: An endotracheal tube is seen with its distal tip approximately 3.2 cm from the carina. There is no evidence of acute infiltrate, pleural effusion or pneumothorax. The heart size and mediastinal contours are within normal limits. The visualized skeletal structures are unremarkable. IMPRESSION: Endotracheal tube positioning, as described above, without evidence of acute or active cardiopulmonary disease. Electronically Signed   By: Aram Candelahaddeus  Houston M.D.   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 EEG to evaluate for seizure Level of alertness: Awake AEDs during EEG study: LEV Technical aspects:  This EEG study was done with scalp electrodes positioned according to the 10-20 International system of electrode placement. Electrical activity was acquired at a sampling rate of 500Hz  and reviewed with a high frequency filter of 70Hz  and a low frequency filter of 1Hz . EEG data were recorded continuously and digitally stored. Description: EEG showed continuous generalized and maximal right temporal  region 2-3Hz  with overriding 13-14Hz  generalized beta activity. Hyperventilation and photic stimulation were not performed.   ABNORMALITY -Continuous slow, generalized IMPRESSION: This study is suggestive of severe diffuse encephalopathy, nonspecific etiology. No seizures or epileptiform discharges were seen throughout the recording. Charlsie Quest   ECHOCARDIOGRAM COMPLETE  Result Date: 07/04/2020    ECHOCARDIOGRAM REPORT   Patient Name:   Mark Oneal Date of Exam: 07/04/2020 Medical Rec #:  [id number]    Height:       64.0 in Accession #:    [id number]   Weight:       199.1 lb Date of Birth:  Jan 31, 1986     BSA:          1.953 m Patient Age:    34 years     BP:           141/67 mmHg Patient Gender: M            HR:           91 bpm. Exam Location:  Inpatient Procedure: 2D Echo Indications:   434.91 stroke  History:       Patient has no prior history of Echocardiogram examinations.                Stroke.  Sonographer:   Celene Skeen RDCS (AE) Referring      [id number] [facility] Phys:  Sonographer Comments: Restricted mobility. poor patient compliance (unable to remain still at times) IMPRESSIONS  1. Left ventricular ejection fraction, by estimation, is 60 to 65%. The left ventricle has normal function. The left ventricle has no regional wall motion abnormalities. Left ventricular diastolic parameters were normal.  2. Right ventricular systolic function is normal. The right ventricular size is normal.  3. The mitral valve is grossly normal. No evidence of mitral valve regurgitation.  4. The aortic valve was  not well visualized. Aortic valve regurgitation is not visualized. Comparison(s): No prior Echocardiogram. FINDINGS  Left Ventricle: Left ventricular ejection fraction, by estimation, is 60 to 65%. The left ventricle has normal function. The left ventricle has no regional wall motion abnormalities. The left ventricular internal cavity size was normal in size. There is  no left ventricular hypertrophy. Left ventricular diastolic parameters were normal. Right Ventricle: The right ventricular size is normal. Right vetricular wall thickness was not well visualized. Right ventricular systolic function is normal. Left Atrium: Left atrial size was normal in size. Right Atrium: Right atrial size was normal in size. Pericardium: There is no evidence of pericardial effusion. Mitral Valve: The mitral valve is grossly normal. No evidence of mitral valve regurgitation. Tricuspid Valve: The tricuspid valve is grossly normal. Tricuspid valve regurgitation is not demonstrated. Aortic Valve: The aortic valve was not well visualized. Aortic valve regurgitation is not visualized. Pulmonic Valve: The pulmonic valve was grossly normal. Pulmonic valve regurgitation is not visualized. Aorta: The aortic root is normal in size and structure. IAS/Shunts: The interatrial septum was not assessed.  LEFT VENTRICLE PLAX 2D LVIDd:         4.50 cm  Diastology LVIDs:         2.40 cm  LV e' medial:    10.70 cm/s LV PW:         0.90 cm  LV E/e' medial:  10.4 LV IVS:        1.10 cm  LV e' lateral:   15.90 cm/s LVOT diam:     2.30 cm  LV E/e' lateral: 7.0 LV SV:  79 LV SV Index:   40 LVOT Area:     4.15 cm  RIGHT VENTRICLE RV S prime:     15.30 cm/s TAPSE (M-mode): 1.7 cm LEFT ATRIUM             Index LA diam:        3.00 cm 1.54 cm/m LA Vol (A2C):   29.6 ml 15.16 ml/m LA Vol (A4C):   32.9 ml 16.85 ml/m LA Biplane Vol: 31.4 ml 16.08 ml/m  AORTIC VALVE LVOT Vmax:   111.00 cm/s LVOT Vmean:  74.900 cm/s LVOT VTI:    0.189 m  AORTA Ao Root diam:  2.80 cm MITRAL VALVE MV Area (PHT): 5.75 cm     SHUNTS MV Decel Time: 132 msec     Systemic VTI:  0.19 m MV E velocity: 111.00 cm/s  Systemic Diam: 2.30 cm MV A velocity: 67.30 cm/s MV E/A ratio:  1.65 Riley Lam MD Electronically signed by Riley Lam MD Signature Date/Time: 07/04/2020/2:33:40 PM    Final    IR PERCUTANEOUS ART THROMBECTOMY/INFUSION INTRACRANIAL INC DIAG ANGIO  Result Date: 07/05/2020 INDICATION: Acute onset of seizures. Left-sided neglect and left-sided weakness with right gaze deviation. 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 EXAM: 1. EMERGENT LARGE VESSEL OCCLUSION THROMBOLYSIS (anterior CIRCULATION) COMPARISON:  CT angiogram of the head and neck of July 03, 2020. MEDICATIONS: Ancef 2 g IV antibiotic was administered within 1 hour of the procedure. ANESTHESIA/SEDATION: General anesthesia CONTRAST:  Omnipaque 300 approximately 120 mL FLUOROSCOPY TIME:  Fluoroscopy Time: 20 minutes 20 seconds (1427 mGy). COMPLICATIONS: None immediate. TECHNIQUE: Following a full explanation of the procedure along with the potential associated complications, an informed witnessed consent was obtained by the interpreter from his live-in girlfriend. The risks of intracranial hemorrhage of 10%, worsening neurological deficit, ventilator dependency, death and inability to revascularize were all reviewed in detail with the patient's girlfriend. The patient was then put under general anesthesia by the Department of Anesthesiology at [facility]. The right groin was prepped and draped in the usual sterile fashion. Thereafter using modified Seldinger technique, transfemoral access into the right common femoral artery was obtained without difficulty. Over a 0.035 inch guidewire an 8 French 25 cm Pinnacle sheath was inserted. Through this, and also over a 0.035 inch guidewire a 5 [place] select JB 1 catheter inside of an 087 balloon guide catheter was  advanced to the aortic arch region and selectively positioned in the right common carotid artery. The guidewire, and select diagnostic catheter were removed. Good aspiration obtained from the hub of the balloon guide catheter in the distal right common carotid artery. An arteriogram was then performed centered intracranially and extracranially. FINDINGS: The right common carotid arteriogram demonstrates the right external carotid artery and its major branches to be widely patent. The right internal carotid artery at the bulb and distal to the bulb demonstrates a tapered angiographic appearance in its proximal 1/3 with complete occlusion with no filling defects and flap noted along the posterior wall proximally. No distal reconstitution is noted intracranially of the right ICA and cavernous and supraclinoid segments. PROCEDURE: Over a 0.014 inch standard Synchro micro guidewire with a J configuration, a combination of an 021 150 cm microcatheter inside of an 071 Zoom aspiration catheter was advanced without difficulty to the cavernous segment. The balloon guide catheter was now also advanced without any resistance to the distal cervical right ICA. A gentle control arteriogram performed through the balloon guide demonstrated  complete occlusion of the right internal carotid artery at the distal cavernous segment. No antegrade flow was noticeable from the proximal right ICA. The micro guidewire was then gently advanced and manipulated with a torque device into the right middle cerebral artery proximally and then into the superior division followed by the microcatheter. Guidewire was removed. Slow aspiration of blood was obtained at the microcatheter hub. A very gentle contrast injection through the microcatheter demonstrated the tip of the microcatheter to be safely positioned with slow flow antegrade clearance of contrast. A 4 mm x 40 mm Solitaire retrieval device was then advanced to the distal end of the  microcatheter. The Zoom aspiration catheter was now advanced into the distal right M1 segment. The O ring on the delivery microcatheter was then loosened. The retrieval device was then deployed in the usual fashion without difficulty. The wire was now retrieved more proximally in the Zoom aspiration catheter. Thereafter, constant aspiration was applied at the hub of the Zoom aspiration catheter, and with 20 mL syringe at the hub of the balloon guide catheter for approximately 3 minutes. Thereafter, slow retrieval of the retrieval device, the microcatheter and the aspiration catheter was performed. Copious amounts of clot were seen in the canister of the aspiration device. Also noted was a piece of clot in the interstices of the retrieval device, and also in the Tuohy [place]. Following reversal of flow arrest in the right internal carotid artery, with free aspiration of blood obtained at the hub, a gentle control arteriogram performed demonstrates free flow in the right internal carotid artery distally in the petrous, cavernous and supraclinoid segments. Noted was opacification of the right middle cerebral artery and the right anterior cerebral artery distributions. Significant spasm was noted in the proximal right middle cerebral artery, and of the superior division of the right middle cerebral artery. These responded to 3 aliquots of 25 mcg of nitroglycerin intra-arterially. At this time, also noted was contrast overlying the right basal ganglia region on angiography. CT of the brain on the table demonstrated dense focal hyper density in the putamen and less prominent in the right caudate head representing contrast with blood. No change was seen in the patient's hemodynamic status at this time. A control arteriogram performed through the balloon guide in the right internal carotid artery continued to demonstrate excellent flow extra cranially and intracranially with now spasm seen in the supraclinoid right ICA, and  persistent moderate spasm in the proximal right M1 segment in the superior divisions. The patient was given 5 mg of intra-arterial verapamil slowly. A repeat arteriogram performed approximately 5 minutes later demonstrated modest improvement in the spasm in the right middle cerebral artery. Repeat arteriograms at 20 minutes following the initial revascularization demonstrated significant improvement in the right middle cerebral artery and the right internal carotid artery vasospasm. There continued to be a TICI 3 revascularization. An exchange 0.014 inch Synchro micro guidewire was then advanced to the horizontal petrous segment. An arteriogram was then performed with the balloon guide catheter in the distal right common carotid artery. This continued to demonstrate excellent flow extra cranially and intracranially. No visible irregularity or an intimal flap was visible. Intracranially improvement is noted in the supraclinoid right ICA. No change was seen in the small focal outpouching in the right posterior communicating artery region suspicious for an aneurysm. A final control arteriogram performed through the right common carotid artery demonstrated patency of the right internal carotid artery extra cranially and intracranially without evidence of intraluminal filling defects  or spasm. The right middle cerebral artery continued to maintain a TICI 3 revascularization. The right anterior cerebral artery remained widely patent. The balloon guide was removed. The 8 French Pinnacle sheath was then replaced with an 8 French Angio-Seal closure device for hemostasis in the right groin. Distal pulses remained palpable in both feet unchanged. Patient was left intubated on account of his medical condition. CT of the brain performed continued to demonstrate no change in the hyperdensity in the right basal ganglia region. Patient was then transferred to neuro ICU for post revascularization management. IMPRESSION: Status post  endovascular complete revascularization of the right internal carotid artery terminus, the right middle cerebral artery, the right anterior cerebral artery, and the extracranial right internal carotid artery, with 1 pass with the 4 mm x 40 mm Solitaire retrieval device, with aspiration, and proximal flow arrest achieving a TICI 3 revascularization. 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 PLAN: Follow-up in the clinic 4 to 6 weeks post discharge. Electronically Signed   By: Julieanne Cotton M.D.   On: 07/04/2020 11:00   CT HEAD CODE STROKE WO CONTRAST  Result Date: 07/03/2020 CLINICAL DATA:  Code stroke.  Left-sided weakness EXAM: CT HEAD WITHOUT CONTRAST TECHNIQUE: Contiguous axial images were obtained from the base of the skull through the vertex without intravenous contrast. COMPARISON:  None. FINDINGS: Brain: There is no acute intracranial hemorrhage. There is suspected hypoattenuation and loss of gray-white differentiation involving the anterior right temporal lobe and inferior lentiform nucleus. There is no mass effect. Ventricles and sulci are within normal limits in size and configuration. There are age-indeterminate but probably chronic right greater than left cerebellar infarcts. Vascular: There is increased density along the right M1 MCA. Skull: Unremarkable. Sinuses/Orbits: No acute abnormality. Other: Mastoid air cells are clear. ASPECTS [facility] Stroke Program Early CT Score) - Ganglionic level infarction (caudate, lentiform nuclei, internal capsule, insula, M1-M3 cortex): 5 - Supraganglionic infarction (M4-M6 cortex): 3 Total score (0-10 with 10 being normal): 8 IMPRESSION: There is no acute intracranial hemorrhage. Suspected thrombus within the right MCA and acute right MCA territory infarction. ASPECT score is 8. These results were called by telephone at the time of interpretation on 07/03/2020 at 1:54 pm to  provider [facility] , who verbally acknowledged these results. Electronically Signed   By: Guadlupe Spanish M.D.   On: 07/03/2020 14:06   VAS [place] LOWER EXTREMITY VENOUS (DVT)  Result Date: 07/04/2020  Lower Venous DVTStudy Indications: Edema.  Risk Factors: Immobility. Performing Technologist: Jannet Askew RCT RDMS  Examination Guidelines: A complete evaluation includes B-mode imaging, spectral Doppler, color Doppler, and power Doppler as needed of all accessible portions of each vessel. Bilateral testing is considered an integral part of a complete examination. Limited examinations for reoccurring indications may be performed as noted. The reflux portion of the exam is performed with the patient in reverse Trendelenburg.  +---------+---------------+---------+-----------+----------+--------------+ RIGHT    CompressibilityPhasicitySpontaneityPropertiesThrombus Aging +---------+---------------+---------+-----------+----------+--------------+ CFV      Full           Yes      Yes                                 +---------+---------------+---------+-----------+----------+--------------+ SFJ      Full                                                        +---------+---------------+---------+-----------+----------+--------------+  FV Prox  Full                                                        +---------+---------------+---------+-----------+----------+--------------+ FV Mid   Full                                                        +---------+---------------+---------+-----------+----------+--------------+ FV DistalFull                                                        +---------+---------------+---------+-----------+----------+--------------+ PFV      Full                                                        +---------+---------------+---------+-----------+----------+--------------+ POP      Full           Yes      Yes                                  +---------+---------------+---------+-----------+----------+--------------+ PTV      Full                                                        +---------+---------------+---------+-----------+----------+--------------+ PERO     Full                                                        +---------+---------------+---------+-----------+----------+--------------+   +---------+---------------+---------+-----------+----------+--------------+ LEFT     CompressibilityPhasicitySpontaneityPropertiesThrombus Aging +---------+---------------+---------+-----------+----------+--------------+ CFV      Full           Yes      Yes                                 +---------+---------------+---------+-----------+----------+--------------+ SFJ      Full                                                        +---------+---------------+---------+-----------+----------+--------------+ FV Prox  Full                                                        +---------+---------------+---------+-----------+----------+--------------+  FV Mid   Full                                                        +---------+---------------+---------+-----------+----------+--------------+ FV DistalFull                                                        +---------+---------------+---------+-----------+----------+--------------+ PFV      Full                                                        +---------+---------------+---------+-----------+----------+--------------+ POP      Full           Yes      Yes                                 +---------+---------------+---------+-----------+----------+--------------+ PTV      Full                                                        +---------+---------------+---------+-----------+----------+--------------+ PERO     Full                                                         +---------+---------------+---------+-----------+----------+--------------+     Summary: RIGHT: - There is no evidence of deep vein thrombosis in the lower extremity.  - No cystic structure found in the popliteal fossa.  LEFT: - There is no evidence of deep vein thrombosis in the lower extremity.  - No cystic structure found in the popliteal fossa.  *See table(s) above for measurements and observations. Electronically signed by Gretta Began MD on 07/04/2020 at 4:25:09 PM.    Final    CT ANGIO HEAD CODE STROKE  Result Date: 07/03/2020 CLINICAL DATA:  Code stroke follow-up EXAM: CT ANGIOGRAPHY HEAD AND NECK TECHNIQUE: Multidetector CT imaging of the head and neck was performed using the standard protocol during bolus administration of intravenous contrast. Multiplanar CT image reconstructions and MIPs were obtained to evaluate the vascular anatomy. Carotid stenosis measurements (when applicable) are obtained utilizing NASCET criteria, using the distal internal carotid diameter as the denominator. CONTRAST:  60mL OMNIPAQUE IOHEXOL 350 MG/ML SOLN COMPARISON:  None. FINDINGS: CTA NECK FINDINGS Aortic arch: Great vessel origins are patent. Right carotid system: Common and external carotid arteries are patent. There is occlusion of the cervical ICA approximately 2 cm above the origin. No reconstitution in the neck. Left carotid system: Patent. Vertebral arteries: Patent.  Left vertebral artery is dominant. Skeleton: No acute abnormality. Other neck: No significant abnormality. Upper chest: Included upper lungs are clear. Review of  the MIP images confirms the above findings CTA HEAD FINDINGS Anterior circulation: There is occlusion of the intracranial right ICA to the level of the PCOM origin where there is reconstitution. There is subsequent occlusion of the proximal right M1 MCA extending to the bifurcation where there is partial reconstitution. Left intracranial internal carotid, left middle cerebral artery, and both  anterior cerebral arteries are patent. Mild calcified plaque along the left ICA. Posterior circulation: Intracranial vertebral arteries, basilar artery, and posterior cerebral arteries are patent. There are right larger than left posterior communicating arteries. Venous sinuses: As permitted by contrast timing, patent. 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 Reconstitution intracranially by a posterior communicating artery. Subsequent occlusion of the proximal right M1 MCA extending to the bifurcation where there is partial reconstitution. These results were communicated to Dr. Derry Lory at 2:13 pm on 07/03/2020 by text page via the [facility] messaging system. Electronically Signed   By: Guadlupe Spanish M.D.   On: 07/03/2020 14:23   CT ANGIO NECK CODE STROKE  Result Date: 07/03/2020 CLINICAL DATA:  Code stroke follow-up EXAM: CT ANGIOGRAPHY HEAD AND NECK TECHNIQUE: Multidetector CT imaging of the head and neck was performed using the standard protocol during bolus administration of intravenous contrast. Multiplanar CT image reconstructions and MIPs were obtained to evaluate the vascular anatomy. Carotid stenosis measurements (when applicable) are obtained utilizing NASCET criteria, using the distal internal carotid diameter as the denominator. CONTRAST:  21mL OMNIPAQUE IOHEXOL 350 MG/ML SOLN COMPARISON:  None. FINDINGS: CTA NECK FINDINGS Aortic arch: Great vessel origins are patent. Right carotid system: Common and external carotid arteries are patent. There is occlusion of the cervical ICA approximately 2 cm above the origin. No reconstitution in the neck. Left carotid system: Patent. Vertebral arteries: Patent.  Left vertebral artery is dominant. Skeleton: No acute abnormality. Other neck: No significant abnormality. Upper chest: Included upper lungs are clear. Review of the MIP images confirms the above findings CTA  HEAD FINDINGS Anterior circulation: There is occlusion of the intracranial right ICA to the level of the PCOM origin where there is reconstitution. There is subsequent occlusion of the proximal right M1 MCA extending to the bifurcation where there is partial reconstitution. Left intracranial internal carotid, left middle cerebral artery, and both anterior cerebral arteries are patent. Mild calcified plaque along the left ICA. Posterior circulation: Intracranial vertebral arteries, basilar artery, and posterior cerebral arteries are patent. There are right larger than left posterior communicating arteries. Venous sinuses: As permitted by contrast timing, patent. 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 Reconstitution intracranially by a posterior communicating artery. Subsequent occlusion of the proximal right M1 MCA extending to the bifurcation where there is partial reconstitution. These results were communicated to Dr. Derry Lory at 2:13 pm on 07/03/2020 by text page via the [facility] messaging system. Electronically Signed   By: Guadlupe Spanish M.D.   On: 07/03/2020 14:23   IR ANGIO INTRA EXTRACRAN SEL COM CAROTID INNOMINATE UNI L MOD SED  Result Date: 07/05/2020 INDICATION: Acute onset of seizures. Left-sided neglect and left-sided weakness with right gaze deviation. 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 EXAM: 1. EMERGENT LARGE VESSEL OCCLUSION THROMBOLYSIS (anterior CIRCULATION) COMPARISON:  CT angiogram of the head and neck of July 03, 2020. MEDICATIONS: Ancef 2 g IV antibiotic was administered within 1 hour of the  procedure. ANESTHESIA/SEDATION: General anesthesia CONTRAST:  Omnipaque 300 approximately 120 mL FLUOROSCOPY TIME:  Fluoroscopy Time: 20 minutes 20 seconds (1427 mGy). COMPLICATIONS: None immediate. TECHNIQUE: Following a full explanation of the procedure along with the  potential associated complications, an informed witnessed consent was obtained by the interpreter from his live-in girlfriend. The risks of intracranial hemorrhage of 10%, worsening neurological deficit, ventilator dependency, death and inability to revascularize were all reviewed in detail with the patient's girlfriend. The patient was then put under general anesthesia by the Department of Anesthesiology at [facility]. The right groin was prepped and draped in the usual sterile fashion. Thereafter using modified Seldinger technique, transfemoral access into the right common femoral artery was obtained without difficulty. Over a 0.035 inch guidewire an 8 French 25 cm Pinnacle sheath was inserted. Through this, and also over a 0.035 inch guidewire a 5 [place] select JB 1 catheter inside of an 087 balloon guide catheter was advanced to the aortic arch region and selectively positioned in the right common carotid artery. The guidewire, and select diagnostic catheter were removed. Good aspiration obtained from the hub of the balloon guide catheter in the distal right common carotid artery. An arteriogram was then performed centered intracranially and extracranially. FINDINGS: The right common carotid arteriogram demonstrates the right external carotid artery and its major branches to be widely patent. The right internal carotid artery at the bulb and distal to the bulb demonstrates a tapered angiographic appearance in its proximal 1/3 with complete occlusion with no filling defects and flap noted along the posterior wall proximally. No distal reconstitution is noted intracranially of the right ICA and cavernous and supraclinoid segments. PROCEDURE: Over a 0.014 inch standard Synchro micro guidewire with a J configuration, a combination of an 021 150 cm microcatheter inside of an 071 Zoom aspiration catheter was advanced without difficulty to the cavernous segment. The balloon guide catheter was now also advanced  without any resistance to the distal cervical right ICA. A gentle control arteriogram performed through the balloon guide demonstrated complete occlusion of the right internal carotid artery at the distal cavernous segment. No antegrade flow was noticeable from the proximal right ICA. The micro guidewire was then gently advanced and manipulated with a torque device into the right middle cerebral artery proximally and then into the superior division followed by the microcatheter. Guidewire was removed. Slow aspiration of blood was obtained at the microcatheter hub. A very gentle contrast injection through the microcatheter demonstrated the tip of the microcatheter to be safely positioned with slow flow antegrade clearance of contrast. A 4 mm x 40 mm Solitaire retrieval device was then advanced to the distal end of the microcatheter. The Zoom aspiration catheter was now advanced into the distal right M1 segment. The O ring on the delivery microcatheter was then loosened. The retrieval device was then deployed in the usual fashion without difficulty. The wire was now retrieved more proximally in the Zoom aspiration catheter. Thereafter, constant aspiration was applied at the hub of the Zoom aspiration catheter, and with 20 mL syringe at the hub of the balloon guide catheter for approximately 3 minutes. Thereafter, slow retrieval of the retrieval device, the microcatheter and the aspiration catheter was performed. Copious amounts of clot were seen in the canister of the aspiration device. Also noted was a piece of clot in the interstices of the retrieval device, and also in the Tuohy [place]. Following reversal of flow arrest in the right internal carotid artery, with free aspiration of blood  obtained at the hub, a gentle control arteriogram performed demonstrates free flow in the right internal carotid artery distally in the petrous, cavernous and supraclinoid segments. Noted was opacification of the right middle  cerebral artery and the right anterior cerebral artery distributions. Significant spasm was noted in the proximal right middle cerebral artery, and of the superior division of the right middle cerebral artery. These responded to 3 aliquots of 25 mcg of nitroglycerin intra-arterially. At this time, also noted was contrast overlying the right basal ganglia region on angiography. CT of the brain on the table demonstrated dense focal hyper density in the putamen and less prominent in the right caudate head representing contrast with blood. No change was seen in the patient's hemodynamic status at this time. A control arteriogram performed through the balloon guide in the right internal carotid artery continued to demonstrate excellent flow extra cranially and intracranially with now spasm seen in the supraclinoid right ICA, and persistent moderate spasm in the proximal right M1 segment in the superior divisions. The patient was given 5 mg of intra-arterial verapamil slowly. A repeat arteriogram performed approximately 5 minutes later demonstrated modest improvement in the spasm in the right middle cerebral artery. Repeat arteriograms at 20 minutes following the initial revascularization demonstrated significant improvement in the right middle cerebral artery and the right internal carotid artery vasospasm. There continued to be a TICI 3 revascularization. An exchange 0.014 inch Synchro micro guidewire was then advanced to the horizontal petrous segment. An arteriogram was then performed with the balloon guide catheter in the distal right common carotid artery. This continued to demonstrate excellent flow extra cranially and intracranially. No visible irregularity or an intimal flap was visible. Intracranially improvement is noted in the supraclinoid right ICA. No change was seen in the small focal outpouching in the right posterior communicating artery region suspicious for an aneurysm. A final control arteriogram  performed through the right common carotid artery demonstrated patency of the right internal carotid artery extra cranially and intracranially without evidence of intraluminal filling defects or spasm. The right middle cerebral artery continued to maintain a TICI 3 revascularization. The right anterior cerebral artery remained widely patent. The balloon guide was removed. The 8 French Pinnacle sheath was then replaced with an 8 French Angio-Seal closure device for hemostasis in the right groin. Distal pulses remained palpable in both feet unchanged. Patient was left intubated on account of his medical condition. CT of the brain performed continued to demonstrate no change in the hyperdensity in the right basal ganglia region. Patient was then transferred to neuro ICU for post revascularization management. IMPRESSION: Status post endovascular complete revascularization of the right internal carotid artery terminus, the right middle cerebral artery, the right anterior cerebral artery, and the extracranial right internal carotid artery, with 1 pass with the 4 mm x 40 mm Solitaire retrieval device, with aspiration, and proximal flow arrest achieving a TICI 3 revascularization. 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 PLAN: Follow-up in the clinic 4 to 6 weeks post discharge. Electronically Signed   By: Julieanne Cotton M.D.   On: 07/04/2020 11:00    Labs:  CBC: Recent Labs    07/03/20 1344 07/03/20 2047 07/04/20 0355 07/05/20 0323  WBC 10.5  --  16.1* 9.2  HGB 15.6 15.0 14.2 12.6*  HCT 44.8 44.0 40.3 37.2*  PLT 238  --  222 162    COAGS: Recent Labs    07/03/20 1344  INR 1.0  APTT 26    BMP: Recent Labs    07/03/20 1340 07/03/20 1340 07/03/20 1344 07/03/20 1344 07/03/20 2047 07/04/20 0201 07/04/20 0355 07/05/20 0323  NA 139   < > 137   < > 137 135 135 140  K 3.8   < > 3.8  --  4.3  --  4.0 3.8  CL  105  --  103  --   --   --  102 106  CO2  --   --  21*  --   --   --  21* 25  GLUCOSE 108*  --  116*  --   --   --  139* 97  BUN 12  --  10  --   --   --  11 14  CALCIUM  --   --  9.4  --   --   --  8.4* 8.5*  CREATININE 0.80  --  1.01  --   --   --  0.95 0.93  GFRNONAA  --   --  >60  --   --   --  >60 >60   < > = values in this interval not displayed.    LIVER FUNCTION TESTS: Recent Labs    07/03/20 1344  BILITOT 0.7  AST 39  ALT 36  ALKPHOS 86  PROT 7.4  ALBUMIN 4.4    Assessment and Plan:  History of acute CVA s/p cerebral arteriogram with emergent mechanical thrombectomy of right ICA (extracranially and terminus), right MCA, and right ACA occlusions achieving a TICI 3 revascularization via right femoral approach 07/03/2020 by Dr. Corliss Skains. Patient's condition improving- awake and alert, follows simple commands, moving right side and LLE with weakness of LLE, no movements of LUE. Right femoral puncture site stable, distal pulses (DPs) 2+ bilaterally. Further plans per neurology/CCM- appreciate and agree with management. NIR to follow.   Electronically Signed: Elwin Mocha, PA-C 07/05/2020, 9:59 AM   I spent a total of 25 Minutes at the the patient's bedside AND on the patient's hospital floor or unit, greater than 50% of which was counseling/coordinating care for CVA s/p revascularization.

## 2020-07-05 NOTE — Progress Notes (Signed)
Modified Barium Swallow Progress Note  Patient Details  Name: Mark Oneal MRN: [id number] Date of Birth: 07-31-1986  Today's Date: 07/05/2020  Modified Barium Swallow completed.  Full report located under Chart Review in the Imaging Section.  Brief recommendations include the following:  Clinical Impression   Pt has an oral more than pharyngeal dysphagia largely due to sensory deficits. He has reduced labial seal without awareness of anterior loss across all consistencies. L buccal pocketing is more prominent with purees and soft solids, and he responds well to cues for lingual sweep. There is little mastication observed and pt loses his attention to oral preparation, needing cues to continue attempts at chewing the bolus. Intermittent premature spillage is noted with thin liquids, and his pharyngeal swallow triggers at the pyriform sinuses with thin and nectar thick liquids; in the valleculae with solids. He has reduced hyolaryngeal elevation and excursion but what seems to contribute the most to his penetration/aspiration with thin liquids is his impaired timing. Penetration occurs more frequently than aspiration does, but when aspiration occurs it is silent. Penetration also reaches the true vocal folds without attempts at clearing. Some more shallow penetration stayed present throughout most of the study. Throat clearing was noted occasionally but did not seem to be directly correlated with any episode of decreased airway protection. Attempted a chin tuck maneuver that seemed to have good potential for use, but pt could not consistently keep his chin down even during a single swallow and with use of interpreter throughout study. Pt had good pharyngeal clearance with solids and liquids, making it likely for him to advance solids clinically as he can work on mastication and awareness of his L side. Would start with Dys 1 (puree) diet and nectar thick liquids.    Swallow Evaluation Recommendations       SLP Diet Recommendations: Dysphagia 1 (Puree) solids;Nectar thick liquid   Liquid Administration via: Cup;Straw   Medication Administration: Crushed with puree   Supervision: Staff to assist with self feeding;Full supervision/cueing for compensatory strategies   Compensations: Lingual sweep for clearance of pocketing;Monitor for anterior loss   Postural Changes: Seated upright at 90 degrees   Oral Care Recommendations: Oral care BID   Other Recommendations: Have oral suction available;Order thickener from pharmacy;Prohibited food (jello, ice cream, thin soups);Remove water pitcher    Mahala Menghini., M.A. CCC-SLP Acute Rehabilitation Services Pager [phone] Office [phone]  07/05/2020,2:33 PM

## 2020-07-05 NOTE — H&P (View-Only) (Signed)
STROKE TEAM PROGRESS NOTE   INTERVAL HISTORY RN at bedside, no family at the bedside.  Patient laying in bed, awake alert, orientated and interactive.  Still has left upper extremity weakness, left lower extremity weakness much improved.  Passed a swallow on diet with pure and nectar thick liquid.  Vitals:   07/05/20 0500 07/05/20 0600 07/05/20 0700 07/05/20 0740  BP: 130/71 (!) 150/82 129/74   Pulse: 65 63 68   Resp: (!) 21 (!) 21 (!) 21   Temp:    98.8 F (37.1 C)  TempSrc:    Axillary  SpO2: 97% 93% 96%   Weight:      Height:       CBC:  Recent Labs  Lab 07/03/20 1344 07/03/20 2047 07/04/20 0355 07/05/20 0323  WBC 10.5  --  16.1* 9.2  NEUTROABS 6.3  --   --   --   HGB 15.6   < > 14.2 12.6*  HCT 44.8   < > 40.3 37.2*  MCV 90.9  --  91.0 92.8  PLT 238  --  222 162   < > = values in this interval not displayed.   Basic Metabolic Panel:  Recent Labs  Lab 07/04/20 0355 07/05/20 0323  NA 135 140  K 4.0 3.8  CL 102 106  CO2 21* 25  GLUCOSE 139* 97  BUN 11 14  CREATININE 0.95 0.93  CALCIUM 8.4* 8.5*   Lipid Panel:  Recent Labs  Lab 07/04/20 0358  CHOL 293*  TRIG 823*  854*  HDL 29*  CHOLHDL 10.1  VLDL UNABLE TO CALCULATE IF TRIGLYCERIDE OVER 400 mg/dL  LDLCALC UNABLE TO CALCULATE IF TRIGLYCERIDE OVER 400 mg/dL   IWPY0D:  Recent Labs  Lab 07/04/20 0358  HGBA1C 5.6   Urine Drug Screen:  Recent Labs  Lab 07/03/20 1738  LABOPIA NONE DETECTED  COCAINSCRNUR NONE DETECTED  LABBENZ NONE DETECTED  AMPHETMU NONE DETECTED  THCU NONE DETECTED  LABBARB NONE DETECTED    Alcohol Level  Recent Labs  Lab 07/03/20 1344  ETH <10    IMAGING past 24 hours MR BRAIN WO CONTRAST  Addendum Date: 07/04/2020   ADDENDUM REPORT: 07/04/2020 14:19 ADDENDUM: These results were called by telephone at the time of interpretation on 07/04/2020 at 2:12 pm to provider Dr. Roda Shutters , who verbally acknowledged these results. Electronically Signed   By: Stana Bunting M.D.    On: 07/04/2020 14:19   Result Date: 07/04/2020 CLINICAL DATA:  Stroke, follow-up. EXAM: MRI HEAD WITHOUT CONTRAST TECHNIQUE: Multiplanar, multiecho pulse sequences of the brain and surrounding structures were obtained without intravenous contrast. COMPARISON:  07/03/2020 noncontrast head CT and CTA head and neck. Later same day 07/03/2020 noncontrast head CT. FINDINGS: Brain: Multifocal restricted diffusion involving the right frontoparietal, occipitotemporal lobes, right insula and right basal ganglia. Remote bilateral cerebellar and right midbrain insults. Right lentiform SWI signal dropout correlates with contrast seen on prior CT. Partial effacement the right lateral ventricle. No midline shift, ventriculomegaly or extra-axial fluid collection. No mass lesion. Vascular: Please see recent CTA. Skull and upper cervical spine: Normal marrow signal. Sinuses/Orbits: Normal orbits. Mild maxillary sinus mucosal thickening with layering ethmoid and sphenoid secretions. Clear mastoid air cells. Other: None. IMPRESSION: Multifocal infarcts involving the right cerebrum and right basal ganglia. Redemonstration of right lentiform contrast. Remote bilateral cerebellar and right midbrain insults. Electronically Signed: By: Stana Bunting M.D. 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 EEG to evaluate for seizure Level of alertness: Awake AEDs during EEG study: LEV Technical aspects: This EEG study was done with scalp electrodes positioned according to the 10-20 International system of electrode placement. Electrical activity was acquired at a  sampling rate of 500Hz  and reviewed with a high frequency filter of 70Hz  and a low frequency filter of 1Hz . EEG data were recorded continuously and digitally stored. Description: EEG showed continuous generalized and maximal right temporal region 2-3Hz  with overriding 13-14Hz  generalized beta activity. Hyperventilation and photic stimulation were not performed.   ABNORMALITY -Continuous slow, generalized IMPRESSION: This study is suggestive of severe diffuse encephalopathy, nonspecific etiology. No seizures or epileptiform discharges were seen throughout the recording.   ECHOCARDIOGRAM COMPLETE  Result Date: 07/04/2020    ECHOCARDIOGRAM REPORT   Patient Name:   Mark Oneal Date of Exam: 07/04/2020 Medical Rec #:  07/06/2020    Height:       64.0 in Accession #:    [name]   Weight:       199.1 lb Date of Birth:  May 20, 1986     BSA:          1.953 m Patient Age:    34 years     BP:           141/67 mmHg Patient Gender: M            HR:           91 bpm. Exam Location:  Inpatient Procedure: 2D Echo Indications:   434.91 stroke  History:       Patient has no prior history of Echocardiogram examinations.                Stroke.  Sonographer:   [id number] RDCS (AE) Referring      [id number] [facility] Phys:  Sonographer Comments: Restricted mobility. poor patient compliance (unable to remain still at times) IMPRESSIONS  1. Left ventricular ejection fraction, by estimation, is 60 to 65%. The left ventricle has normal function. The left ventricle has no regional wall motion abnormalities. Left ventricular diastolic parameters were normal.  2. Right ventricular systolic function is normal. The right ventricular size is normal.  3. The mitral valve is grossly normal. No evidence of mitral valve regurgitation.  4. The aortic valve was not well visualized. Aortic valve regurgitation is not visualized. Comparison(s): No prior Echocardiogram. FINDINGS  Left Ventricle: Left ventricular ejection  fraction, by estimation, is 60 to 65%. The left ventricle has normal function. The left ventricle has no regional wall motion abnormalities. The left ventricular internal cavity size was normal in size. There is  no left ventricular hypertrophy. Left ventricular diastolic parameters were normal. Right Ventricle: The right ventricular size is normal. Right vetricular wall thickness was not well visualized. Right ventricular systolic function is normal. Left Atrium: Left atrial size was normal in size. Right Atrium: Right atrial size was normal in size. Pericardium: There is no evidence of pericardial effusion. Mitral Valve: The mitral valve is grossly normal. No evidence of mitral valve regurgitation. Tricuspid Valve: The tricuspid valve is grossly normal. Tricuspid valve regurgitation is not demonstrated. Aortic Valve: The aortic valve was not well visualized. Aortic valve  regurgitation is not visualized. Pulmonic Valve: The pulmonic valve was grossly normal. Pulmonic valve regurgitation is not visualized. Aorta: The aortic root is normal in size and structure. IAS/Shunts: The interatrial septum was not assessed.  LEFT VENTRICLE PLAX 2D LVIDd:         4.50 cm  Diastology LVIDs:         2.40 cm  LV e' medial:    10.70 cm/s LV PW:         0.90 cm  LV E/e' medial:  10.4 LV IVS:        1.10 cm  LV e' lateral:   15.90 cm/s LVOT diam:     2.30 cm  LV E/e' lateral: 7.0 LV SV:         79 LV SV Index:   40 LVOT Area:     4.15 cm  RIGHT VENTRICLE RV S prime:     15.30 cm/s TAPSE (M-mode): 1.7 cm LEFT ATRIUM             Index LA diam:        3.00 cm 1.54 cm/m LA Vol (A2C):   29.6 ml 15.16 ml/m LA Vol (A4C):   32.9 ml 16.85 ml/m LA Biplane Vol: 31.4 ml 16.08 ml/m  AORTIC VALVE LVOT Vmax:   111.00 cm/s LVOT Vmean:  74.900 cm/s LVOT VTI:    0.189 m  AORTA Ao Root diam: 2.80 cm MITRAL VALVE MV Area (PHT): 5.75 cm     SHUNTS MV Decel Time: 132 msec     Systemic VTI:  0.19 m MV E velocity: 111.00 cm/s  Systemic Diam: 2.30 cm  MV A velocity: 67.30 cm/s MV E/A ratio:  1.65 Riley LamMahesh Chandrasekhar MD Electronically signed by Riley LamMahesh Chandrasekhar MD Signature Date/Time: 07/04/2020/2:33:40 PM    Final    VAS US LOWER EXTREMITY VENOUS (DVT)  Result Date: 07/04/2020  Lower Venous DVTStudy Indications: Edema.  Risk Factors: Immobility. Performing Technologist: Jannet AskewVernon [place]ac[place] RCT RDMS  Examination Guidelines: A complete evaluation includes B-mode imaging, spectral Doppler, color Doppler, and power Doppler as needed of all accessible portions of each vessel. Bilateral testing is considered an integral part of a complete examination. Limited examinations for reoccurring indications may be performed as noted. The reflux portion of the exam is performed with the patient in reverse Trendelenburg.  +---------+---------------+---------+-----------+----------+--------------+ RIGHT    CompressibilityPhasicitySpontaneityPropertiesThrombus Aging +---------+---------------+---------+-----------+----------+--------------+ CFV      Full           Yes      Yes                                 +---------+---------------+---------+-----------+----------+--------------+ SFJ      Full                                                        +---------+---------------+---------+-----------+----------+--------------+ FV Prox  Full                                                        +---------+---------------+---------+-----------+----------+--------------+ FV Mid   Full                                                        +---------+---------------+---------+-----------+----------+--------------+  FV DistalFull                                                        +---------+---------------+---------+-----------+----------+--------------+ PFV      Full                                                        +---------+---------------+---------+-----------+----------+--------------+ POP      Full           Yes       Yes                                 +---------+---------------+---------+-----------+----------+--------------+ PTV      Full                                                        +---------+---------------+---------+-----------+----------+--------------+ PERO     Full                                                        +---------+---------------+---------+-----------+----------+--------------+   +---------+---------------+---------+-----------+----------+--------------+ LEFT     CompressibilityPhasicitySpontaneityPropertiesThrombus Aging +---------+---------------+---------+-----------+----------+--------------+ CFV      Full           Yes      Yes                                 +---------+---------------+---------+-----------+----------+--------------+ SFJ      Full                                                        +---------+---------------+---------+-----------+----------+--------------+ FV Prox  Full                                                        +---------+---------------+---------+-----------+----------+--------------+ FV Mid   Full                                                        +---------+---------------+---------+-----------+----------+--------------+ FV DistalFull                                                        +---------+---------------+---------+-----------+----------+--------------+  PFV      Full                                                        +---------+---------------+---------+-----------+----------+--------------+ POP      Full           Yes      Yes                                 +---------+---------------+---------+-----------+----------+--------------+ PTV      Full                                                        +---------+---------------+---------+-----------+----------+--------------+ PERO     Full                                                         +---------+---------------+---------+-----------+----------+--------------+     Summary: RIGHT: - There is no evidence of deep vein thrombosis in the lower extremity.  - No cystic structure found in the popliteal fossa.  LEFT: - There is no evidence of deep vein thrombosis in the lower extremity.  - No cystic structure found in the popliteal fossa.  *See table(s) above for measurements and observations. Electronically signed by Gretta Began MD on 07/04/2020 at 4:25:09 PM.    Final     PHYSICAL EXAM  Temp:  [98.5 F (36.9 C)-99 F (37.2 C)] 98.8 F (37.1 C) (10/20 0740) Pulse Rate:  [63-105] 68 (10/20 0700) Resp:  [15-21] 21 (10/20 0700) BP: (115-150)/(63-82) 129/74 (10/20 0700) SpO2:  [93 %-100 %] 96 % (10/20 0700) Arterial Line BP: (100-110)/(75-80) 110/80 (10/19 1000) FiO2 (%):  [28 %] 28 % (10/19 0838)  General - Well nourished, well developed, not in acute distress.  Ophthalmologic - fundi not visualized due to noncooperation.  Cardiovascular - Regular rhythm and rate.  Neuro - awake alert eyes open. Orientated to place, time and age. Paucity of speech, able to name 2/3 and repeat in dysarthric voice. Mild to moderate dysarthria. Following simple commands. Visual field full, able to have bilateral gaze. Left facial droop, tongue protrusion to the left. Left UE proximal 0/5, 2/5 bicep, 1/5 tricep, 0/5 finger movement. LLE 4/5 proximal and distal. RUE and RLE 5/5. Sensation subjectively symmetrical. RUE FTN intact. Gait not tested.    ASSESSMENT/PLAN [name] is a 34 y.o. 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 Found to have L sided weakness w/ neglect and R gaze on arrival to ED. CTH w/ hyperdense R MCA w/ w/ cervical R ICA occlusion and ASPECTS 8. Received IV tPA 07/03/2020 at 1356 and sent to IR.  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 Suspect R MCA thrombus w/ acute infarct. ASPECTS 8.    CTA head & neck cervical R ICA occlusion 2cm above origin w/ reconstitution and subsequent occlusion proximal R M1 extending into bifurcation   Cerebral angio / IR extracranial R ICA/terminus, R MCA occlusion w/ TICI3 revascularization. Possible R ICA dissection  Repeat CT head dense contrast staining posterior R lentiform (no hemorrhage). New/increased edema R basal ganglia, R insula, anterior temporal lobe w/ mild mass effect R lateral ventricle. 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  Therapy recommendations:  CIR   Disposition:  pending   Seizure  Witnessed at stroke onset  Seizure in CT  Treated w/ ativan  Loaded w/ Keppra 1500  Now on Keppra 500 q 12h  EEG no sz, slowing   Hx stroke/TIA  R SCA cerebellar stroke in [place] - 05/2019  Etiology not sure  Acute Respiratory Failure  Secondary to stroke  Intubated for IR, left intubated post IR per anesthesia  Extubated 10/19 w/o difficulty  CCM on board   Hyperlipidemia  Home meds:  No statin  TG 823->pending, direct LDL 151, goal < 70  On lipitor 80    Continue statin at discharge  Dysphagia   . Secondary to stroke . Has a swallow after modified barium study . Pure diet with nectar thick liquid . Speech on board   Other Stroke Risk Factors  Obesity, Body mass index is 34.17 kg/m., recommend weight loss, diet and exercise as appropriate   Other Active Problems  Leukocytosis WBC  16.1->9.2 (afebrile)  Hospital day # 2  This patient is critically ill due to right MCA stroke due to right ICA occlusion and right MCA occlusion status post TPA and thrombectomy, seizure at onset, dysphagia and at significant risk of neurological worsening, death form recurrent stroke, hemorrhagic conversion, status epilepticus, aspiration pneumonia. This patient's care requires constant monitoring of vital signs, hemodynamics, respiratory and cardiac monitoring, review of multiple databases, neurological assessment, discussion with family, other specialists and medical decision making of high complexity. I spent 30 minutes of neurocritical care time in the care of this patient.  Marvel Plan, MD PhD Stroke Neurology 07/05/2020 4:47 PM     To contact Stroke Continuity provider, please refer to [email or web address]. After hours, contact General Neurology

## 2020-07-05 NOTE — Progress Notes (Signed)
{Physical Therapy Treatment Patient Details Name: Mark Oneal MRN: [id number] DOB: 23-Aug-1986 Today's Date: 07/05/2020    History of Present Illness Mark Oneal is a 34 y.o. 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 Pt found to have occluded R ICA, MCA, and ACA requiring thrombectomy by IR. Pt also received tPA.    PT Comments    Pt demonstrating significant improvement with acknowledgment of L side and L LE strength, however continues to have significant weakness of L UE, and inability to use L UE functionally to assist with transfers. Pt without L knee buckling today during standing and std pvt to chair. Pt continues with significant L lateral lean in sitting and standing requiring assist to maintain midline posture. Focused on marching in walker with maxA to maintain L UE on walker and to maintain midline position. Pt able to adequately clear both R and L feet with no L knee buckling. Continue to recommend CIR Upon d/c for maximal functional recovery. Acute PT to cont to follow to progress indep with mobility.   Follow Up Recommendations  CIR     Equipment Recommendations   (TBD)    Recommendations for Other Services Rehab consult     Precautions / Restrictions Precautions Precautions: Fall Precaution Comments: L neglect Restrictions Weight Bearing Restrictions: No    Mobility  Bed Mobility Overal bed mobility: Needs Assistance Bed Mobility: Rolling;Sidelying to Sit Rolling: Mod assist Sidelying to sit: Mod assist       General bed mobility comments: max directional cues, max for L UE movement, pt able to bring LEs off EOB, modA for trunk elevation  Transfers Overall transfer level: Needs assistance Equipment used: 2 person hand held assist Transfers: Sit to/from [employer] Sit to Stand: Min  assist;+2 physical assistance Stand pivot transfers: Mod assist;+2 physical assistance       General transfer comment: pt powers up well with improved L LE strength, minimal buckling compared to yesterday, max directional verbal cues to sequence stepping pattern to complete stand pvt to chair, pt mildly impulsive  Ambulation/Gait             General Gait Details: unable at this time, however did work on marching in [employer] with modAx2, pt unable to grip RW with Lhand requiring maxA to maintain grip, maxA to maintain midline posture due to significant L lateral lean, pt was able to bring L knee up to significantly clear the ground, pt able to lift R LE and maintain L knee extension with minimal blocking at knee   Stairs             Wheelchair Mobility    Modified Rankin (Stroke Patients Only) Modified Rankin (Stroke Patients Only) Pre-Morbid Rankin Score: No significant disability Modified Rankin: Moderately severe disability     Balance Overall balance assessment: Needs assistance Sitting-balance support: Feet supported;Single extremity supported Sitting balance-Leahy Scale: Fair Sitting balance - Comments: pt with no use of L UE to support self, pt with L lateral lean, unable to self correct   Standing balance support: During functional activity Standing balance-Leahy Scale: Poor Standing balance comment: dependent on external support                            Cognition Arousal/Alertness: Awake/alert (sleepy but responsive and participatory) Behavior  During Therapy: Flat affect Overall Cognitive Status: Impaired/Different from baseline Area of Impairment: Safety/judgement;Problem solving                         Safety/Judgement: Decreased awareness of deficits (L neglect but improved from yesterday)   Problem Solving: Slow processing;Difficulty sequencing;Requires verbal cues;Requires tactile cues General Comments: pt with improved L sided  awareness today, continues to have difficulty sequencing and processing multistep commands      Exercises      General Comments General comments (skin integrity, edema, etc.): VSS on RA      Pertinent Vitals/Pain Pain Assessment: 0-10 Pain Score: 3  Pain Location: headache Pain Descriptors / Indicators: Headache Pain Intervention(s): Monitored during session    Home Living                      Prior Function            PT Goals (current goals can now be found in the care plan section) Acute Rehab PT Goals Patient Stated Goal: get better    Frequency    Min 4X/week      PT Plan Current plan remains appropriate    Co-evaluation              AM-PAC PT "6 Clicks" Mobility   Outcome Measure  Help needed turning from your back to your side while in a flat bed without using bedrails?: A Lot Help needed moving from lying on your back to sitting on the side of a flat bed without using bedrails?: A Lot Help needed moving to and from a bed to a chair (including a wheelchair)?: A Lot Help needed standing up from a chair using your arms (e.g., wheelchair or bedside chair)?: A Lot Help needed to walk in hospital room?: Total Help needed climbing 3-5 steps with a railing? : Total 6 Click Score: 10    End of Session Equipment Utilized During Treatment: Gait belt Activity Tolerance: Patient tolerated treatment well Patient left: with call bell/phone within reach;in chair;with chair alarm set;with family/visitor present Nurse Communication: Mobility status PT Visit Diagnosis: Unsteadiness on feet (R26.81);Muscle weakness (generalized) (M62.81);Difficulty in walking, not elsewhere classified (R26.2)     Time: 2841-3244 PT Time Calculation (min) (ACUTE ONLY): 37 min  Charges:  $Gait Training: 8-22 mins $Neuromuscular Re-education: 8-22 mins                    Mark Oneal, PT, DPT Acute Rehabilitation Services Pager #: [phone] Office #:  [phone]    Mark Oneal 07/05/2020, 2:53 PM

## 2020-07-06 ENCOUNTER — Inpatient Hospital Stay (HOSPITAL_COMMUNITY): Payer: Medicaid Other | Admitting: Anesthesiology

## 2020-07-06 ENCOUNTER — Inpatient Hospital Stay (HOSPITAL_COMMUNITY): Payer: Medicaid Other

## 2020-07-06 ENCOUNTER — Encounter (HOSPITAL_COMMUNITY)
Admission: EM | Disposition: A | Discharge status: Rehabilitation Facility | Payer: Self-pay | Source: Home / Self Care | Attending: Neurology

## 2020-07-06 ENCOUNTER — Encounter (HOSPITAL_COMMUNITY): Payer: Self-pay | Admitting: Cardiology

## 2020-07-06 DIAGNOSIS — I6389 Other cerebral infarction: Secondary | ICD-10-CM

## 2020-07-06 DIAGNOSIS — I63231 Cerebral infarction due to unspecified occlusion or stenosis of right carotid arteries: Secondary | ICD-10-CM

## 2020-07-06 HISTORY — PX: TEE WITHOUT CARDIOVERSION: SHX5443

## 2020-07-06 HISTORY — PX: BUBBLE STUDY: SHX6837

## 2020-07-06 LAB — BASIC METABOLIC PANEL
Anion gap: 8 (ref 5–15)
BUN: 11 mg/dL (ref 6–20)
CO2: 24 mmol/L (ref 22–32)
Calcium: 9.2 mg/dL (ref 8.9–10.3)
Chloride: 105 mmol/L (ref 98–111)
Creatinine, Ser: 0.85 mg/dL (ref 0.61–1.24)
GFR, Estimated: >60 mL/min (ref >60)
Glucose, Bld: 102 mg/dL — ABNORMAL HIGH (ref 70–99)
Potassium: 3.9 mmol/L (ref 3.5–5.1)
Sodium: 137 mmol/L (ref 135–145)

## 2020-07-06 LAB — TRIGLYCERIDES: Triglycerides: 479 mg/dL — ABNORMAL HIGH (ref <150)

## 2020-07-06 LAB — CBC
HCT: 38.1 % — ABNORMAL LOW (ref 39.0–52.0)
Hemoglobin: 13.5 g/dL (ref 13.0–17.0)
MCH: 32.4 pg (ref 26.0–34.0)
MCHC: 35.4 g/dL (ref 30.0–36.0)
MCV: 91.4 fL (ref 80.0–100.0)
Platelets: 178 10*3/uL (ref 150–400)
RBC: 4.17 MIL/uL — ABNORMAL LOW (ref 4.22–5.81)
RDW: 11.6 % (ref 11.5–15.5)
WBC: 9.3 10*3/uL (ref 4.0–10.5)
nRBC: 0 % (ref 0.0–0.2)

## 2020-07-06 LAB — CARDIOLIPIN ANTIBODIES, IGG, IGM, IGA
Anticardiolipin IgA: <9 APL U/mL (ref 0–11)
Anticardiolipin IgG: <9 GPL U/mL (ref 0–14)
Anticardiolipin IgM: 16 MPL U/mL — ABNORMAL HIGH (ref 0–12)

## 2020-07-06 LAB — LUPUS ANTICOAGULANT PANEL
DRVVT: 37.8 s (ref 0.0–47.0)
PTT Lupus Anticoagulant: 28.1 s (ref 0.0–51.9)

## 2020-07-06 LAB — C-REACTIVE PROTEIN

## 2020-07-06 SURGERY — ECHOCARDIOGRAM, TRANSESOPHAGEAL
Anesthesia: Monitor Anesthesia Care

## 2020-07-06 MED ORDER — SODIUM CHLORIDE 0.9 % IV SOLN: INTRAVENOUS | Status: DC

## 2020-07-06 MED ORDER — PROPOFOL 10 MG/ML IV BOLUS
INTRAVENOUS | Status: DC | PRN
  Administered 2020-07-06 (×6): 20 mg via INTRAVENOUS

## 2020-07-06 MED ORDER — LACTATED RINGERS IV SOLN: INTRAVENOUS | Status: DC

## 2020-07-06 MED ORDER — PROPOFOL 500 MG/50ML IV EMUL
INTRAVENOUS | Status: DC | PRN
  Administered 2020-07-06: 50 ug/kg/min via INTRAVENOUS

## 2020-07-06 MED ORDER — LIDOCAINE HCL (CARDIAC) PF 100 MG/5ML IV SOSY
PREFILLED_SYRINGE | INTRAVENOUS | Status: DC | PRN
  Administered 2020-07-06: 50 mg via INTRAVENOUS

## 2020-07-06 NOTE — Anesthesia Postprocedure Evaluation (Signed)
Anesthesia Post Note  Patient: Mark Oneal  Procedure(s) Performed: TRANSESOPHAGEAL ECHOCARDIOGRAM (TEE) (N/A ) BUBBLE STUDY     Patient location during evaluation: PACU Anesthesia Type: MAC Level of consciousness: patient cooperative Pain management: pain level controlled Vital Signs Assessment: post-procedure vital signs reviewed and stable Respiratory status: spontaneous breathing, nonlabored ventilation, respiratory function stable and patient connected to nasal cannula oxygen Cardiovascular status: stable and blood pressure returned to baseline Postop Assessment: no apparent nausea or vomiting Anesthetic complications: no Comments: Persistent L, hemiparesis and neglect. Mental status at baseline.   No complications documented.  Last Vitals:  Vitals:   07/06/20 1012 07/06/20 1538  BP: 125/90 117/83  Pulse: 79 80  Resp:  20  Temp: 37.1 C 36.8 C  SpO2: 98% 95%    Last Pain:  Vitals:   07/06/20 1538  TempSrc: Oral  PainSc:                  Noal Abshier COKER

## 2020-07-06 NOTE — Transfer of Care (Signed)
Immediate Anesthesia Transfer of Care Note  Patient: Mark Oneal  Procedure(s) Performed: TRANSESOPHAGEAL ECHOCARDIOGRAM (TEE) (N/A ) BUBBLE STUDY  Patient Location: Endoscopy Unit  Anesthesia Type:MAC  Level of Consciousness: awake, alert  and oriented  Airway & Oxygen Therapy: Patient Spontanous Breathing and Patient connected to nasal cannula oxygen  Post-op Assessment: Report given to RN and Post -op Vital signs reviewed and stable  Post vital signs: Reviewed and stable  Last Vitals:  Vitals Value Taken Time  BP 129/74 07/06/20 0829  Temp 36.4 C 07/06/20 0827  Pulse 79 07/06/20 0831  Resp 17 07/06/20 0831  SpO2 99 % 07/06/20 0831  Vitals shown include unvalidated device data.  Last Pain:  Vitals:   07/06/20 0827  TempSrc: Oral  PainSc: 0-No pain         Complications: No complications documented.

## 2020-07-06 NOTE — Progress Notes (Signed)
Inpatient Rehabilitation Admissions Coordinator  I await medical workup completion and Cir bed to admit. I met with patient at bedside and he is in agreement.  Danne Baxter, RN, MSN Rehab Admissions Coordinator [phone] 07/06/2020 12:54 PM

## 2020-07-06 NOTE — PMR Pre-admission (Addendum)
PMR Admission Coordinator Pre-Admission Assessment  Patient: Mark Oneal is an 34 y.o., male MRN: [id number] DOB: [birth year] Height: 5' 4"  (162.6 cm) Weight: 90.3 kg              Insurance Information  PRIMARY: uninsured        First source contacted on 10/20 to assess for financial assistance. Elie Confer Revels at [phone]  Financial Counselor:       Phone#:   The "Data Collection Information Summary" for patients in Inpatient Rehabilitation Facilities with attached "Privacy Act [place] Records" was provided and verbally reviewed with: N/A  Emergency Contact Information Contact Information     Name Relation Home Work Homeacre-Lyndora, Erin Sons Spouse   [phone]   Chais, Fehringer JKDT   [phone]      Current Medical History  Patient Admitting Diagnosis: CVA  History of Present Illness: 34 y.o. male with history of R-SCA infarct with residual left limp otherwise in relatively good health. He was admitted on 07/03/2020 with dysarthria, fall and "jerking" activity of BUE/BLE.   On evaluation by EMS, he was found to have right gaze deviation and left sided hemiparesis. UDS negative. CT head showed hyperdense R-MCA sign and CTA head/neck showed occlusion of R-ICA 2 cm above origin with reconstitution by PCA and subsequent occlusion of proximal right M1 MCA. He underwent cerebral angiogram with complete revascularization of occluded R-ICA and ICA terminus, R-MCA and R-ACA--question proximal right ICA dissection.  Follow up CT head showed left BG/caudate head infarct with contrast stain (? Bleed)  and mild cerebral edema. He was loaded with Keppra and to continue until clinic follow-up.  He was started on hypertonic saline.  EEG performed, showing severe diffuse encephalopathy, negative for seizures He tolerated extubation on 07/04/2020.TEE no SOE, negative bubble. VTE lovenox prophylaxis.   He was started on aspirin and Plavix.  Patient with resultant higher level cognitive  deficits, bouts of lethargy, left hemiparesis with left inattention, proprioceptive deficits and lack of awareness of deficits.    Back pain noted on 10/25. Diclofenac patch given . Low grade temperatures also noted with elevated WBC. CXR with left lower infiltrate. Concerning for pneumonia. UA negative. Given Cefdinir bid. Back pain resolved.  Complete NIHSS TOTAL: 4 Glasgow Coma Scale Score: 15  Past Medical History  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    Family History  family history includes Hypertension in his father and mother.  Prior Rehab/Hospitalizations:  Has the patient had prior rehab or hospitalizations prior to admission? Yes  Has the patient had major surgery during 100 days prior to admission? Yes  Current Medications   Current Facility-Administered Medications:    0.9 %  sodium chloride infusion, , Intravenous, Continuous, Rosalin Hawking, MD, Last Rate: 50 mL/hr at 07/11/20 2143, New Bag at 07/11/20 2143   acetaminophen (TYLENOL) tablet 650 mg, 650 mg, Oral, Q4H PRN, 650 mg at 07/09/20 2009 **OR** acetaminophen (TYLENOL) 160 MG/5ML solution 650 mg, 650 mg, Per Tube, Q4H PRN **OR** acetaminophen (TYLENOL) suppository 650 mg, 650 mg, Rectal, Q4H PRN, Deveshwar, Sanjeev, MD   aspirin chewable tablet 81 mg, 81 mg, Oral, Daily, Rosalin Hawking, MD, 81 mg at 07/12/20 0920   atorvastatin (LIPITOR) tablet 80 mg, 80 mg, Oral, Daily, Rosalin Hawking, MD, 80 mg at 07/12/20 0920   cefdinir (OMNICEF) capsule 300 mg, 300 mg, Oral, Q12H, Biby, Sharon L, NP, 300 mg at 07/12/20 0921   chlorhexidine gluconate (MEDLINE KIT) (PERIDEX) 0.12 %  solution 15 mL, 15 mL, Mouth Rinse, BID, Donnetta Simpers, MD, 15 mL at 07/11/20 1135   Chlorhexidine Gluconate Cloth 2 % PADS 6 each, 6 each, Topical, Daily, Bhagat, Srishti L, MD, 6 each at 07/12/20 6546   clopidogrel (PLAVIX) tablet 75 mg, 75 mg, Oral, Daily, Rosalin Hawking, MD, 75 mg at 07/12/20 0920    diclofenac (FLECTOR) 1.3 % 1 patch, 1 patch, Transdermal, BID, Bhagat, Srishti L, MD, 1 patch at 07/10/20 2200   docusate (COLACE) 50 MG/5ML liquid 100 mg, 100 mg, Oral, BID, Desai, Rahul P, PA-C, 100 mg at 07/11/20 2133   enoxaparin (LOVENOX) injection 40 mg, 40 mg, Subcutaneous, Daily, Rosalin Hawking, MD, 40 mg at 07/12/20 0920   levETIRAcetam (KEPPRA) tablet 500 mg, 500 mg, Oral, BID, Rosalin Hawking, MD, 500 mg at 07/12/20 0920   lidocaine (LIDODERM) 5 % 1 patch, 1 patch, Transdermal, Daily, Bhagat, Srishti L, MD   MEDLINE mouth rinse, 15 mL, Mouth Rinse, BID, Rosalin Hawking, MD, 15 mL at 07/12/20 0924   pantoprazole (PROTONIX) injection 40 mg, 40 mg, Intravenous, Daily, Rosalin Hawking, MD, 40 mg at 07/12/20 5035   polyethylene glycol (MIRALAX / GLYCOLAX) packet 17 g, 17 g, Oral, Daily, Desai, Rahul P, PA-C, 17 g at 07/11/20 1130   Resource ThickenUp Clear, , Oral, PRN, Rosalin Hawking, MD   senna-docusate (Senokot-S) tablet 1 tablet, 1 tablet, Oral, QHS PRN, Donnetta Simpers, MD   senna-docusate (Senokot-S) tablet 1 tablet, 1 tablet, Oral, BID, Donnetta Simpers, MD, 1 tablet at 07/11/20 2131  Patients Current Diet:  Diet Order             DIET DYS 3 Room service appropriate? Yes with Assist; Fluid consistency: Nectar Thick  Diet effective now                   Precautions / Restrictions Precautions Precautions: Fall Precaution Comments: Lft inattention Restrictions Weight Bearing Restrictions: No   Has the patient had 2 or more falls or a fall with injury in the past year?No  Prior Activity Level Community (5-7x/wk): independent, working, driving. Moved form [place] 3 months ago. Works in Architect.   Prior Functional Level Prior Function Level of Independence: Independent Comments: works in Architect; reports has a 53 y/o daughter and 2 y/o son  Self Care: Did the patient need help bathing, dressing, using the toilet or eating?  Independent  Indoor Mobility: Did the patient need  assistance with walking from room to room (with or without device)? Independent  Stairs: Did the patient need assistance with internal or external stairs (with or without device)? Independent  Functional Cognition: Did the patient need help planning regular tasks such as shopping or remembering to take medications? Independent  Home Assistive Devices / Equipment    Prior Device Use: Indicate devices/aids used by the patient prior to current illness, exacerbation or injury? None of the above  Current Functional Level Cognition  Arousal/Alertness: Awake/alert Overall Cognitive Status: Impaired/Different from baseline Current Attention Level: Sustained Orientation Level: Oriented X4 Following Commands: Follows one step commands with increased time Safety/Judgement: Decreased awareness of safety, Decreased awareness of deficits General Comments: very slow processing, requires multiple cues to initiate at times Attention: Sustained Sustained Attention: Impaired Sustained Attention Impairment: Functional basic Awareness: Impaired Awareness Impairment: Emergent impairment, Intellectual impairment Problem Solving: Impaired Problem Solving Impairment: Functional basic Safety/Judgment: Impaired    Extremity Assessment (includes Sensation/Coordination)  Upper Extremity Assessment: LUE deficits/detail LUE Deficits / Details: improvements in LUE movement noted today, remains with  poor /weak grasp and fine motor strength LUE Sensation: decreased light touch, decreased proprioception LUE Coordination: decreased fine motor, decreased gross motor  Lower Extremity Assessment: Defer to PT evaluation LLE Deficits / Details: grossly 2+/5, withdraw to pain    ADLs  Overall ADL's : Needs assistance/impaired Eating/Feeding: NPO Grooming: Moderate assistance, Sitting Upper Body Bathing: Moderate assistance, Sitting Lower Body Bathing: Maximal assistance, +2 for physical assistance, +2 for  safety/equipment, Sit to/from stand Upper Body Dressing : Maximal assistance, Sitting Lower Body Dressing: Maximal assistance, +2 for physical assistance, +2 for safety/equipment, Sit to/from stand Lower Body Dressing Details (indicate cue type and reason): attempts to don socks at EOB but ultimately requiring assist including assist for sitting balance and to support LEs in modified figure 4 Toileting- Clothing Manipulation and Hygiene: Total assistance, +2 for physical assistance, +2 for safety/equipment, Sit to/from stand Functional mobility during ADLs: Maximal assistance, +2 for physical assistance, +2 for safety/equipment (sit<>Stand and side steps along EOB) General ADL Comments: focus on therapeutic activity seated EOB incorporating LUE    Mobility  Overal bed mobility: Needs Assistance Bed Mobility: Supine to Sit Rolling:  (cues required for sequencing) Sidelying to sit: Min guard Supine to sit: Supervision, HOB elevated Sit to supine: Min guard, HOB elevated General bed mobility comments: use of bed rail to pull up into sitting    Transfers  Overall transfer level: Needs assistance Equipment used: Rolling walker (2 wheeled) Transfers: Sit to/from Stand Sit to Stand: Min guard Stand pivot transfers: Min guard  Lateral/Scoot Transfers: Min guard General transfer comment: PT assistance for safety, cues for hand placement, pt requries multiple cues to initiate    Ambulation / Gait / Stairs / Wheelchair Mobility  Ambulation/Gait Ambulation/Gait assistance: Herbalist (Feet): 150 Feet Assistive device: Rolling walker (2 wheeled) Gait Pattern/deviations: Step-to pattern General Gait Details: pt with step to gait, poor control and attention to L hand on RW. Pt initially ambulating with only R hand on walker and requires PT cues to place L hand back on walker during ambulation. Pt also requires multiple cues for direction, often attempting to walk into other pt's rooms  thinking they are his own even though they are nowhere close to his room Gait velocity: reduced Gait velocity interpretation: <1.8 ft/sec, indicate of risk for recurrent falls    Posture / Balance Dynamic Sitting Balance Sitting balance - Comments: pt tending to rely on RUE support but able to sit without support. Balance Overall balance assessment: Needs assistance Sitting-balance support: No upper extremity supported, Feet supported Sitting balance-Leahy Scale: Good Sitting balance - Comments: pt tending to rely on RUE support but able to sit without support. Standing balance support: Single extremity supported Standing balance-Leahy Scale: Poor Standing balance comment: reliant on UE support of RW    Special needs/care consideration  Patient speaks English; wife primary Spanish Seizure precautions with plans for no driving until seizure free for 6 months an d under physicians care   Previous Home Environment  Living Arrangements: Spouse/significant other, Children  Lives With: Spouse, Son, Daughter Available Help at Discharge: Family, Available 24 hours/day (wife) Type of Home: House Home Layout: One level Home Access: Level entry Bathroom Shower/Tub: Chiropodist: Standard Bathroom Accessibility: Yes How Accessible: Accessible via walker [place]: No  Discharge Living Setting Plans for Discharge Living Setting: Patient's home, Lives with (comment) (wife and 68 year old daughter and 21 year old son) Type of Home at Discharge: House Discharge Home Layout: One  level Discharge Home Access: Level entry Discharge Bathroom Shower/Tub: Tub/shower unit Discharge Bathroom Toilet: Standard Discharge Bathroom Accessibility: Yes How Accessible: Accessible via walker Does the patient have any problems obtaining your medications?: Yes (Describe) (uninsured)  Social/Family/Support Systems Patient Roles: Spouse, Parent Contact Information: wife, Erin Sons,  SPanish speaking Anticipated Caregiver: wife Anticipated Caregiver's Contact Information: [phone] Ability/Limitations of Caregiver: none Caregiver Availability: 24/7 Discharge Plan Discussed with Primary Caregiver: Yes Is Caregiver In Agreement with Plan?: Yes Does Caregiver/Family have Issues with Lodging/Transportation while Pt is in Rehab?: No  Wife cares for autistic son. Pt's Mom trying to get Visa/Passport issues resolved to come visit to help from [place]. She was to visit in December. Aunt in [place], but has no transportation.  Goals Patient/Family Goal for Rehab: supervision/Mod I with PT and OT, supervision with SLP Expected length of stay: ELOS 8-12 days. Cultural Considerations: SPanish speaking; patient can speak [place] , wife needs interpreter Additional Information: Pt's Mom trying to get Visa/Passport issues resolved to come from [place] to help (AUnt lives in [place] but does not have transportation) Pt/Family Agrees to Admission and willing to participate: Yes Program Orientation Provided & Reviewed with Pt/Caregiver Including Roles  & Responsibilities: Yes  Decrease burden of Care through IP rehab admission: n/a  Possible need for SNF placement upon discharge:not anticipated  Patient Condition: This patient's medical and functional status has changed since the consult dated: 07/05/2020 in which the Rehabilitation Physician determined and documented that the patient's condition is appropriate for intensive rehabilitative care in an inpatient rehabilitation facility. See "History of Present Illness" (above) for medical update. Functional changes [id number] min to mod assist. Patient's medical and functional status update has been discussed with the Rehabilitation physician and patient remains appropriate for inpatient rehabilitation. Will admit to inpatient rehab today.  Preadmission Screen Completed By: Danne Baxter RN MSN with updates by Julious Payer,  Audelia Acton, RN, 07/12/2020 11:46 AM ______________________________________________________________________   Discussed status with Dr. Posey Pronto on 07/12/2020 at  1146 and received approval for admission today.  Admission Coordinator: Danne Baxter RN MSN with updates by Julious Payer, Audelia Acton, time 1146 Date 07/12/2020

## 2020-07-06 NOTE — CV Procedure (Signed)
     TRANSESOPHAGEAL ECHOCARDIOGRAM   NAME:  Mark Oneal   MRN: [id number] DOB:  10-04-1985   ADMIT DATE: 07/03/2020  INDICATIONS: CVA  PROCEDURE:   Informed consent was obtained prior to the procedure. The risks, benefits and alternatives for the procedure were discussed and the patient comprehended these risks.  Risks include, but are not limited to, cough, sore throat, vomiting, nausea, somnolence, esophageal and stomach trauma or perforation, bleeding, low blood pressure, aspiration, pneumonia, infection, trauma to the teeth and death.    After a procedural time-out, the oropharynx was anesthetized and the patient was sedated by the anesthesia service. The transesophageal probe was inserted in the esophagus and stomach without difficulty and multiple views were obtained. Anesthesia was monitored by Yvonne Kendall, CRNA.    COMPLICATIONS:    There were no immediate complications.  FINDINGS:  No cardiac source of embolism seen.  Negative bubble study.   Mark Lesches MD [facility]  [address] [place], [place] [address] [phone]   8:24 AM

## 2020-07-06 NOTE — Progress Notes (Signed)
STROKE TEAM PROGRESS NOTE   INTERVAL HISTORY No family at bedside.  Patient had TEE today showed no PFO, no source of cardiac emboli.  Patient told me that he had a stroke last year in [place] likely due to heavy smoking, alcohol and cocaine use.  Since then, he has quit all of them.  Currently, she is working as Corporate investment banker, denies any head or neck trauma.  Vitals:   07/06/20 0855 07/06/20 0900 07/06/20 0905 07/06/20 1012  BP: 126/72   125/90  Pulse: 64 62 63 79  Resp: 17 12 14    Temp:    98.7 F (37.1 C)  TempSrc:    Axillary  SpO2: 100% 100% 100% 98%  Weight:      Height:       CBC:  Recent Labs  Lab 07/03/20 1344 07/03/20 2047 07/05/20 0323 07/06/20 0542  WBC 10.5   < > 9.2 9.3  NEUTROABS 6.3  --   --   --   HGB 15.6   < > 12.6* 13.5  HCT 44.8   < > 37.2* 38.1*  MCV 90.9   < > 92.8 91.4  PLT 238   < > 162 178   < > = values in this interval not displayed.   Basic Metabolic Panel:  Recent Labs  Lab 07/05/20 0323 07/06/20 0542  NA 140 137  K 3.8 3.9  CL 106 105  CO2 25 24  GLUCOSE 97 102*  BUN 14 11  CREATININE 0.93 0.85  CALCIUM 8.5* 9.2   Lipid Panel:  Recent Labs  Lab 07/04/20 0358 07/04/20 0358 07/06/20 0542  CHOL 293*  --   --   TRIG 823*  854*   < > 479*  HDL 29*  --   --   CHOLHDL 10.1  --   --   VLDL UNABLE TO CALCULATE IF TRIGLYCERIDE OVER 400 mg/dL  --   --   LDLCALC UNABLE TO CALCULATE IF TRIGLYCERIDE OVER 400 mg/dL  --   --    < > = values in this interval not displayed.   HgbA1c:  Recent Labs  Lab 07/04/20 0358  HGBA1C 5.6   Urine Drug Screen:  Recent Labs  Lab 07/03/20 1738  LABOPIA NONE DETECTED  COCAINSCRNUR NONE DETECTED  LABBENZ NONE DETECTED  AMPHETMU NONE DETECTED  THCU NONE DETECTED  LABBARB NONE DETECTED    Alcohol Level  Recent Labs  Lab 07/03/20 1344  ETH <10    IMAGING past 24 hours No results found.  PHYSICAL EXAM   Temp:  [97.4 F (36.3 C)-99.3 F (37.4 C)] 98.7 F (37.1 C) (10/21  1012) Pulse Rate:  [57-88] 79 (10/21 1012) Resp:  [12-20] 14 (10/21 0905) BP: (110-136)/(56-94) 125/90 (10/21 1012) SpO2:  [98 %-100 %] 98 % (10/21 1012) Weight:  [90.3 kg] 90.3 kg (10/21 0659)  General - Well nourished, well developed, not in acute distress.  Ophthalmologic - fundi not visualized due to noncooperation.  Cardiovascular - Regular rhythm and rate.  Neuro - awake alert eyes open. Orientated to place, time and age. Paucity of speech, able to name 2/3 and repeat in dysarthric voice. Moderate dysarthria. Following simple commands. Visual field full, able to have bilateral gaze. Left facial droop, tongue protrusion to the left. Left UE proximal 3-/5, 3+/5 bicep, 2/5 tricep, 2/5 finger movement. LLE 4+/5 proximal and distal. RUE and RLE 5/5. Sensation subjectively symmetrical. RUE FTN intact. Gait not tested.    ASSESSMENT/PLAN [name] is a  34 y.o. 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 Found to have L sided weakness w/ neglect and R gaze on arrival to ED. CTH w/ hyperdense R MCA w/ w/ cervical R ICA occlusion and ASPECTS 8. Received IV tPA 07/03/2020 at 1356 and sent to IR.  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 Suspect R MCA thrombus w/ acute infarct. ASPECTS 8.    CTA head & neck cervical R ICA occlusion 2cm above origin w/ reconstitution and subsequent occlusion proximal R M1 extending into bifurcation   Cerebral angio / IR extracranial R ICA/terminus, R MCA occlusion w/ TICI3 revascularization. Possible R ICA dissection  Repeat CT head dense contrast staining posterior R lentiform (no hemorrhage). New/increased edema R basal ganglia, R insula, anterior temporal lobe w/ mild mass  effect R lateral ventricle. 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  MRI right MCA multifocal infarcts  LE Doppler no DVT  2D Echo EF 60-65%  TEE No SOE, neg bubble  TG 823, direct LDL 151   HgbA1c - 5.6  UDS - negative  Hypercoagulable labs neg thus far only ACL IgM 16 slightly elevated  VTE prophylaxis - SCDs   aspirin 81 mg daily prior to admission, now on aspirin 81 and the Plavix 75 DAPT for 3 weeks and then Plavix alone.  Therapy recommendations:  CIR   Disposition:  pending   Seizure  Witnessed at stroke onset  Seizure in CT  Treated w/ ativan  Loaded w/ Keppra 1500  Now on Keppra 500 q 12h  EEG no sz, slowing   No driving until seizure free for 6 months and under physician's care  Hx stroke/TIA  R SCA cerebellar stroke in [place] - 05/2019  Etiology not sure, per pt likely due to heavy smoking, alcohol and cocaine use.  Since then, he has quit all of them.  Acute Respiratory Failure  Secondary to stroke  Intubated for IR, left intubated post IR per anesthesia  Extubated 10/19 w/o difficulty  CCM on board   Hyperlipidemia  Home meds:  No statin  TG 823->479, direct LDL 151, goal < 70  On lipitor 80    Continue statin at discharge  Dysphagia   . Secondary to stroke . Has a swallow after modified barium study . Pure diet with nectar thick liquid . Speech on board . On IVF @ 50   Other Stroke Risk Factors  Obesity, Body mass index is 34.17 kg/m., recommend weight loss, diet and exercise as appropriate   Other Active Problems  Leukocytosis WBC 16.1->9.2->9.3 (afebrile)  Hospital day # 3   Marvel Plan, MD PhD Stroke Neurology 07/06/2020 12:40 PM     To contact Stroke Continuity provider, please refer to [email or web address]. After hours, contact General Neurology

## 2020-07-06 NOTE — Progress Notes (Signed)
  Echocardiogram Echocardiogram Transesophageal has been performed.  Mark Oneal 07/06/2020, 8:35 AM

## 2020-07-06 NOTE — Anesthesia Procedure Notes (Signed)
Procedure Name: MAC Date/Time: 07/06/2020 7:50 AM Performed by: Mariea Clonts, CRNA Pre-anesthesia Checklist: Patient identified, Emergency Drugs available, Suction available, Patient being monitored and Timeout performed Patient Re-evaluated:Patient Re-evaluated prior to induction Oxygen Delivery Method: Simple face mask and Nasal cannula

## 2020-07-06 NOTE — Interval H&P Note (Signed)
History and Physical Interval Note:  07/06/2020 7:25 AM  Mark Oneal  has presented today for surgery, with the diagnosis of STROKE.  The various methods of treatment have been discussed with the patient and family. After consideration of risks, benefits and other options for treatment, the patient has consented to  Procedure(s): TRANSESOPHAGEAL ECHOCARDIOGRAM (TEE) (N/A) as a surgical intervention.  The patient's history has been reviewed, patient examined, no change in status, stable for surgery.  I have reviewed the patient's chart and labs.  Questions were answered to the patient's satisfaction.     Little Ishikawa

## 2020-07-06 NOTE — Anesthesia Preprocedure Evaluation (Addendum)
Anesthesia Evaluation  Patient identified by MRN, date of birth, ID band Patient awake    Reviewed: Allergy & Precautions, NPO status , Patient's Chart, lab work & pertinent test results  Airway Mallampati: II  TM Distance: >3 FB Neck ROM: Full    Dental  (+) Teeth Intact, Dental Advisory Given   Pulmonary former smoker,    breath sounds clear to auscultation       Cardiovascular  Rhythm:Regular Rate:Normal     Neuro/Psych    GI/Hepatic   Endo/Other    Renal/GU      Musculoskeletal   Abdominal   Peds  Hematology   Anesthesia Other Findings   Reproductive/Obstetrics                             Anesthesia Physical Anesthesia Plan  ASA: III  Anesthesia Plan: MAC   Post-op Pain Management:    Induction: Intravenous  PONV Risk Score and Plan: Ondansetron and Dexamethasone  Airway Management Planned: Natural Airway and Nasal Cannula  Additional Equipment:   Intra-op Plan:   Post-operative Plan: Extubation in OR  Informed Consent: I have reviewed the patients History and Physical, chart, labs and discussed the procedure including the risks, benefits and alternatives for the proposed anesthesia with the patient or authorized representative who has indicated his/her understanding and acceptance.     Dental advisory given  Plan Discussed with: CRNA and Anesthesiologist  Anesthesia Plan Comments:         Anesthesia Quick Evaluation

## 2020-07-06 NOTE — Progress Notes (Signed)
SLP Cancellation Note  Patient Details Name: Mark Oneal MRN: [id number] DOB: [birth year]   Cancelled treatment: Pt at procedure- will continue efforts.    Mark Oneal L. Mark Frederic, MA CCC/SLP Acute Rehabilitation Services Office number [phone] Pager [phone]                                                                                                        Mark Oneal 07/06/2020, 8:14 AM

## 2020-07-07 DIAGNOSIS — R569 Unspecified convulsions: Secondary | ICD-10-CM

## 2020-07-07 DIAGNOSIS — E785 Hyperlipidemia, unspecified: Secondary | ICD-10-CM | POA: Diagnosis present

## 2020-07-07 DIAGNOSIS — I69391 Dysphagia following cerebral infarction: Secondary | ICD-10-CM

## 2020-07-07 DIAGNOSIS — Z87898 Personal history of other specified conditions: Secondary | ICD-10-CM

## 2020-07-07 DIAGNOSIS — I63411 Cerebral infarction due to embolism of right middle cerebral artery: Principal | ICD-10-CM

## 2020-07-07 DIAGNOSIS — D72829 Elevated white blood cell count, unspecified: Secondary | ICD-10-CM | POA: Diagnosis not present

## 2020-07-07 DIAGNOSIS — E669 Obesity, unspecified: Secondary | ICD-10-CM | POA: Diagnosis present

## 2020-07-07 LAB — CBC
HCT: 41.3 % (ref 39.0–52.0)
Hemoglobin: 14.7 g/dL (ref 13.0–17.0)
MCH: 32 pg (ref 26.0–34.0)
MCHC: 35.6 g/dL (ref 30.0–36.0)
MCV: 89.8 fL (ref 80.0–100.0)
Platelets: 192 10*3/uL (ref 150–400)
RBC: 4.6 MIL/uL (ref 4.22–5.81)
RDW: 11.4 % — ABNORMAL LOW (ref 11.5–15.5)
WBC: 11.2 10*3/uL — ABNORMAL HIGH (ref 4.0–10.5)
nRBC: 0 % (ref 0.0–0.2)

## 2020-07-07 LAB — BASIC METABOLIC PANEL
Anion gap: 12 (ref 5–15)
BUN: 15 mg/dL (ref 6–20)
CO2: 22 mmol/L (ref 22–32)
Calcium: 9.8 mg/dL (ref 8.9–10.3)
Chloride: 102 mmol/L (ref 98–111)
Creatinine, Ser: 1 mg/dL (ref 0.61–1.24)
GFR, Estimated: >60 mL/min (ref >60)
Glucose, Bld: 110 mg/dL — ABNORMAL HIGH (ref 70–99)
Potassium: 3.8 mmol/L (ref 3.5–5.1)
Sodium: 136 mmol/L (ref 135–145)

## 2020-07-07 NOTE — Progress Notes (Signed)
STROKE TEAM PROGRESS NOTE   INTERVAL HISTORY No family at the bedside.  Patient lying in chair, no acute distress.  Left upper extremity continues to improve strengths.  Left lower extremity strong, he was able to walk with PT OT in the hallway with walker.  Pending CIR.  Vitals:   07/06/20 2324 07/07/20 0405 07/07/20 0740 07/07/20 1343  BP: 116/71 112/73 122/73 127/79  Pulse: 87 89 82 92  Resp: 18 18 18 18   Temp: 98.7 F (37.1 C) 98.7 F (37.1 C) 98.9 F (37.2 C) 98.1 F (36.7 C)  TempSrc: Oral Oral Oral Oral  SpO2: 99% 96% 97% 97%  Weight:      Height:       CBC:  Recent Labs  Lab 07/03/20 1344 07/03/20 2047 07/06/20 0542 07/07/20 0158  WBC 10.5   < > 9.3 11.2*  NEUTROABS 6.3  --   --   --   HGB 15.6   < > 13.5 14.7  HCT 44.8   < > 38.1* 41.3  MCV 90.9   < > 91.4 89.8  PLT 238   < > 178 192   < > = values in this interval not displayed.   Basic Metabolic Panel:  Recent Labs  Lab 07/06/20 0542 07/07/20 0158  NA 137 136  K 3.9 3.8  CL 105 102  CO2 24 22  GLUCOSE 102* 110*  BUN 11 15  CREATININE 0.85 1.00  CALCIUM 9.2 9.8   Lipid Panel:  Recent Labs  Lab 07/04/20 0358 07/04/20 0358 07/06/20 0542  CHOL 293*  --   --   TRIG 823*  854*   < > 479*  HDL 29*  --   --   CHOLHDL 10.1  --   --   VLDL UNABLE TO CALCULATE IF TRIGLYCERIDE OVER 400 mg/dL  --   --   LDLCALC UNABLE TO CALCULATE IF TRIGLYCERIDE OVER 400 mg/dL  --   --    < > = values in this interval not displayed.   HgbA1c:  Recent Labs  Lab 07/04/20 0358  HGBA1C 5.6   Urine Drug Screen:  Recent Labs  Lab 07/03/20 1738  LABOPIA NONE DETECTED  COCAINSCRNUR NONE DETECTED  LABBENZ NONE DETECTED  AMPHETMU NONE DETECTED  THCU NONE DETECTED  LABBARB NONE DETECTED    Alcohol Level  Recent Labs  Lab 07/03/20 1344  ETH <10    IMAGING past 24 hours No results found.  PHYSICAL EXAM     Temp:  [98.1 F (36.7 C)-98.9 F (37.2 C)] 98.1 F (36.7 C) (10/22 1343) Pulse Rate:  [80-92] 92  (10/22 1343) Resp:  [18-20] 18 (10/22 1343) BP: (112-127)/(71-83) 127/79 (10/22 1343) SpO2:  [95 %-99 %] 97 % (10/22 1343)  General - Well nourished, well developed, not in acute distress.  Ophthalmologic - fundi not visualized due to noncooperation.  Cardiovascular - Regular rhythm and rate.  Neuro - awake alert eyes open. Orientated to place, time and age. Paucity of speech, able to name 3/3 and repeat in dysarthric voice.  Mild to moderate dysarthria. Following simple commands. Visual field full, able to have bilateral gaze. Left facial droop, tongue protrusion to the left. Left UE proximal 3/5, 3+/5 bicep, 2/5 tricep, 2/5 finger movement. LLE 4+/5 proximal and distal. RUE and RLE 5/5. Sensation subjectively symmetrical. RUE FTN intact. Gait not tested.    ASSESSMENT/PLAN [name] is a 34 y.o. 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 Found to have L sided weakness w/ neglect and R gaze on arrival to ED. CTH w/ hyperdense R MCA w/ w/ cervical R ICA occlusion and ASPECTS 8. Received IV tPA 07/03/2020 at 1356 and sent to IR.  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 Suspect R MCA thrombus w/ acute infarct. ASPECTS 8.    CTA head & neck cervical R ICA occlusion 2cm above origin w/ reconstitution and subsequent occlusion proximal R M1 extending into bifurcation   Cerebral angio / IR extracranial R ICA/terminus, R MCA occlusion w/ TICI3 revascularization. Possible R ICA dissection  Repeat CT head dense contrast staining posterior R lentiform (no hemorrhage). New/increased edema R basal ganglia, R insula, anterior temporal lobe w/ mild mass effect R lateral ventricle. Chronic R>L cerebellar  infarcts  MRI right MCA multifocal infarcts  LE Doppler no DVT  2D Echo EF 60-65%  TEE No SOE, neg bubble  TG 823, direct LDL 151   HgbA1c - 5.6  UDS - negative  Hypercoagulable labs neg thus far only ACL IgM 16 slightly elevated  VTE prophylaxis - Lovenox 40 mg sq daily   aspirin 81 mg daily prior to admission, now on aspirin 81 and the Plavix 75 DAPT for 3 weeks and then Plavix alone.  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  Since then, he has quit all of them.  Acute Respiratory Failure, resolved  Secondary to stroke  Intubated for IR, left intubated post IR per anesthesia  Extubated 10/19 w/o difficulty  Hyperlipidemia  Home meds:  No statin  TG 823->479, direct LDL 151, goal < 70  On lipitor 80    Continue statin at discharge  Dysphagia   . Secondary to stroke . Has a swallow after modified barium study . Dys 1 with nectar thick liquid . Speech on board . On IVF @ 50   Other Stroke Risk Factors  Obesity, Body mass index is 34.17 kg/m., recommend weight loss, diet and exercise as appropriate   Other Active Problems  Leukocytosis WBC 16.1->9.2->9.3->11.2 (afebrile)  Hospital day # 4   Marvel Plan, MD PhD Stroke Neurology 07/07/2020 2:40 PM     To contact Stroke Continuity provider, please refer to [email or web address]. After hours, contact General Neurology

## 2020-07-07 NOTE — Progress Notes (Signed)
  Speech Language Pathology Treatment: Dysphagia;Cognitive-Linquistic  Patient Details Name: Mark Oneal MRN: [id number] DOB: [birth year] Today's Date: 07/07/2020 Time: 0160-1093 SLP Time Calculation (min) (ACUTE ONLY): 20 min  Assessment / Plan / Recommendation Clinical Impression  Pt was seen for treatment and was cooperative during the session. He continues to demonstrate impaired awareness but exhibited emerging awareness today. He denied deficits in speech or cognition and stated that his swallowing is normal but indicated that his arm may be a "little" weak. Pt was able to attend to tasks and required cues for problem solving related to safety. He was oriented x4 with minimal cueing for temporal orientation. More advanced cognitive-linguistic tasks were deferred since a Spanish interpreter was unavailable at the time of the session using [employer] language services. Pt tolerated nectar thick liquids via straw without overt s/sx of aspiration. He was able to demonstrate a chin tuck posture with cueing. He exhibited intermittent throat clearing with trials of dysphagia 2 solids. Mastication was prolonged and a rotary chew was not observed. Anterior spillage was observed. Mild residue was noted in the left lateral sulcus which was resistant to clearing despite use of lingual sweeps. It is recommended that the pt's current diet of dysphagia 1 (puree) solids and thin liquids be continued. SLP will continue to follow pt.    HPI HPI: Martine Trageser is a 34 y.o. 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 Pt found to have occluded R ICA, MCA, and ACA requiring thrombectomy by IR. Pt also received tPA. ETT 10/18-10/19.      SLP Plan  Continue with current plan of care       Recommendations  Diet recommendations: Dysphagia 1 (puree);Nectar-thick  liquid Liquids provided via: Cup;Straw Medication Administration: Crushed with puree Supervision: Staff to assist with self feeding Compensations: Lingual sweep for clearance of pocketing;Monitor for anterior loss Postural Changes and/or Swallow Maneuvers: Seated upright 90 degrees                Oral Care Recommendations: Oral care BID Follow up Recommendations: Inpatient Rehab SLP Visit Diagnosis: Dysphagia, unspecified (R13.10) Plan: Continue with current plan of care       Levester Waldridge I. Vear Clock, MS, CCC-SLP Acute Rehabilitation Services Office number [phone] Pager [phone]                [name] 07/07/2020, 6:00 PM

## 2020-07-07 NOTE — Progress Notes (Signed)
Physical Therapy Treatment Patient Details Name: Mark Oneal MRN: [id number] DOB: 12-Sep-1986 Today's Date: 07/07/2020    History of Present Illness Mark Oneal is a 34 y.o. 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 Pt found to have occluded R ICA, MCA, and ACA requiring thrombectomy by IR. Pt also received tPA.    PT Comments    Pt tolerates treatment well with improved LLE strength and gait quality. Pt does continue to demonstrate profound LUE weakness and is unable to safely manage RW with this hand currently. Pt demonstrates impaired safety awareness during session, attempting to squat and pivot to recliner and partially sitting on armrest prior to making it to chair. Pt demonstrates great motivation to continue working with therapies and progressing mobility quality. PT continues to recommend CIR at this time.   Follow Up Recommendations  CIR     Equipment Recommendations  Cane    Recommendations for Other Services       Precautions / Restrictions Precautions Precautions: Fall Precaution Comments: mild L neglect Restrictions Weight Bearing Restrictions: No    Mobility  Bed Mobility Overal bed mobility: Needs Assistance Bed Mobility: Supine to Sit   Sidelying to sit: Min guard       General bed mobility comments: use of bed rails, increased time  Transfers Overall transfer level: Needs assistance Equipment used: Rolling walker (2 wheeled);None Transfers: Sit to/from [employer] to Stand: Min assist Stand pivot transfers: Mod assist       General transfer comment: modA SPT without device, poor safety awareness. Pt with improved transfers for sit to stand with use of RW and finally without use of RW, requiring minA due to weakness and imbalance  Ambulation/Gait Ambulation/Gait assistance: Min  assist;Mod assist Gait Distance (Feet): 50 Feet (additional trial of 20' without device) Assistive device: Rolling walker (2 wheeled);None Gait Pattern/deviations: Step-to pattern;Decreased dorsiflexion - left Gait velocity: reduced Gait velocity interpretation: <1.8 ft/sec, indicate of risk for recurrent falls General Gait Details: pt with short step to gait, requires PT assist to maintain LUE on RW and to steer RW due to LUE weakness. Pt with L foot drag with fatigue. Pt with improved gait quality without use of RW, slight increase in sway but not bumping into objects as with RW   Stairs             Wheelchair Mobility    Modified Rankin (Stroke Patients Only) Modified Rankin (Stroke Patients Only) Pre-Morbid Rankin Score: No significant disability Modified Rankin: Moderately severe disability     Balance Overall balance assessment: Needs assistance Sitting-balance support: Single extremity supported;Feet supported Sitting balance-Leahy Scale: Poor Sitting balance - Comments: reliant on unilateral UE support due to intermittent left lean   Standing balance support: During functional activity;No upper extremity supported Standing balance-Leahy Scale: Poor Standing balance comment: min-modA for standing balance without UE support                            Cognition Arousal/Alertness: Awake/alert Behavior During Therapy: WFL for tasks assessed/performed Overall Cognitive Status: Impaired/Different from baseline Area of Impairment: Attention;Memory;Following commands;Safety/judgement;Awareness;Problem solving                   Current Attention Level: Sustained Memory: Decreased recall of precautions Following Commands: Follows one step commands  consistently;Follows multi-step commands with increased time Safety/Judgement: Decreased awareness of deficits;Decreased awareness of safety Awareness: Emergent Problem Solving: Slow processing;Difficulty  sequencing        Exercises      General Comments General comments (skin integrity, edema, etc.): VSS on RA      Pertinent Vitals/Pain Pain Assessment: No/denies pain    Home Living                      Prior Function            PT Goals (current goals can now be found in the care plan section) Acute Rehab PT Goals Patient Stated Goal: get better Progress towards PT goals: Progressing toward goals    Frequency    Min 4X/week      PT Plan Current plan remains appropriate    Co-evaluation              AM-PAC PT "6 Clicks" Mobility   Outcome Measure  Help needed turning from your back to your side while in a flat bed without using bedrails?: A Little Help needed moving from lying on your back to sitting on the side of a flat bed without using bedrails?: A Little Help needed moving to and from a bed to a chair (including a wheelchair)?: A Lot Help needed standing up from a chair using your arms (e.g., wheelchair or bedside chair)?: A Little Help needed to walk in hospital room?: A Little Help needed climbing 3-5 steps with a railing? : Total 6 Click Score: 15    End of Session Equipment Utilized During Treatment: Gait belt Activity Tolerance: Patient tolerated treatment well Patient left: in chair;with call bell/phone within reach;with chair alarm set Nurse Communication: Mobility status PT Visit Diagnosis: Unsteadiness on feet (R26.81);Muscle weakness (generalized) (M62.81);Difficulty in walking, not elsewhere classified (R26.2)     Time: 1027-1050 PT Time Calculation (min) (ACUTE ONLY): 23 min  Charges:  $Gait Training: 8-22 mins $Therapeutic Activity: 8-22 mins                     Arlyss Gandy, PT, DPT Acute Rehabilitation Pager: [phone]    Arlyss Gandy 07/07/2020, 11:45 AM

## 2020-07-07 NOTE — Progress Notes (Signed)
Inpatient Rehabilitation Admissions Coordinator  I do not have a CIR bed to admit this patient today. I will follow up on Monday with clarification of bed availability. He is an excellent candidate pending bed availability. Patient is in agreement.  Ottie Glazier, RN, MSN Rehab Admissions Coordinator [phone] 07/07/2020 12:04 PM

## 2020-07-07 NOTE — Progress Notes (Signed)
Occupational Therapy Treatment Patient Details Name: Mark Oneal MRN: [id number] DOB: 06/07/1986 Today's Date: 07/07/2020    History of present illness Mark Oneal is a 34 y.o. 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 Pt found to have occluded R ICA, MCA, and ACA requiring thrombectomy by IR. Pt also received tPA.   OT comments  Pt pleasant and agreeable to session this date, session with focus on single step command following, LUE ROM and strengthening via AAROM and WB tasks while seated at EOB completing static/dynamic reaching tasks. Pt requiring mod cues and up to mod A for supine>sitting transition with decreased insight and awareness to remove/untwist blanket form around LE's, once sitting EOB pt with good static balance requiring only occasional use of R UE for support. Unable to return demo SROM/AAROM seated EOB for strengthening. Fluctuating functional performance present in L UE with questionable apraxic presentation. Will continue to assess. With completion of clock draw test in sitting, preference for r environment/page, skewing of numbers indicated decreased spatial awareness, and impulsive response for time placement with hands at 10 & 11 for 'ten past eleven'. Pt continue with acute OT needs with recommendations listed below    Follow Up Recommendations  CIR;Supervision/Assistance - 24 hour    Equipment Recommendations  Other (comment)    Recommendations for Other Services Rehab consult    Precautions / Restrictions Precautions Precautions: Fall Precaution Comments: L inattention Restrictions Weight Bearing Restrictions: No       Mobility Bed Mobility Overal bed mobility: Needs Assistance Bed Mobility: Rolling;Supine to Sit Rolling: Mod assist (cues required for sequencing) Sidelying to sit: Min guard        General bed mobility comments: HOB slightly elevated, cues for untangling LE's from blankets  Transfers Overall transfer level: Needs assistance Equipment used: Rolling walker (2 wheeled);None Transfers: Lateral/Scoot Transfers Sit to Stand: Min assist Stand pivot transfers: Mod assist      Lateral/Scoot Transfers: Mod assist General transfer comment: decreased sequencing and need for cues for execution of all lateral scoots to HOB.     Balance Overall balance assessment: Needs assistance Sitting-balance support: Single extremity supported;Feet supported Sitting balance-Leahy Scale: Poor Sitting balance - Comments: reliant on unilateral UE support due to intermittent left lean   Standing balance support: During functional activity;No upper extremity supported Standing balance-Leahy Scale: Poor Standing balance comment: min-modA for standing balance without UE support                           ADL either performed or assessed with clinical judgement   ADL                                               Vision       Perception     Praxis      Cognition Arousal/Alertness: Awake/alert Behavior During Therapy: WFL for tasks assessed/performed Overall Cognitive Status: Impaired/Different from baseline Area of Impairment: Attention;Memory;Following commands;Safety/judgement;Awareness;Problem solving                   Current Attention Level: Sustained Memory: Decreased recall of precautions Following Commands: Follows one step commands consistently;Follows one step commands with increased time Safety/Judgement:  Decreased awareness of deficits;Decreased awareness of safety Awareness: Emergent Problem Solving: Slow processing;Decreased initiation;Difficulty sequencing General Comments: difficulty with multi-step commands, improved with slowed guidance through task completion, decreased insight but appears to demo mildly improved L  attention, ?field cut with confrontation testing noted but difficulty d/t command following to assess full visual impairments        Exercises Exercises: General Upper Extremity General Exercises - Upper Extremity Shoulder Flexion: AAROM;10 reps;Seated;Left Shoulder ABduction: AAROM;10 reps;Seated;Left Elbow Flexion: AAROM;10 reps;Seated;Left Elbow Extension: AAROM;10 reps;Seated;Left Wrist Flexion: 10 reps;Seated;AROM;Left Wrist Extension: AROM;Left;10 reps;Seated   Shoulder Instructions       General Comments VSS on RA    Pertinent Vitals/ Pain       Pain Assessment: No/denies pain  Home Living                                          Prior Functioning/Environment              Frequency  Min 3X/week        Progress Toward Goals  OT Goals(current goals can now be found in the care plan section)  Progress towards OT goals: Progressing toward goals  Acute Rehab OT Goals Patient Stated Goal: get better  Plan Discharge plan remains appropriate    Co-evaluation                 AM-PAC OT "6 Clicks" Daily Activity     Outcome Measure   Help from another person eating meals?: A Little Help from another person taking care of personal grooming?: A Little Help from another person toileting, which includes using toliet, bedpan, or urinal?: Total Help from another person bathing (including washing, rinsing, drying)?: A Lot Help from another person to put on and taking off regular upper body clothing?: A Lot Help from another person to put on and taking off regular lower body clothing?: A Lot 6 Click Score: 13    End of Session    OT Visit Diagnosis: Hemiplegia and hemiparesis;Other symptoms and signs involving cognitive function;Other abnormalities of gait and mobility (R26.89) Hemiplegia - Right/Left: Left Hemiplegia - dominant/non-dominant: Non-Dominant Hemiplegia - caused by: Cerebral infarction   Activity Tolerance Patient  tolerated treatment well   Patient Left in bed;with bed alarm set;with call bell/phone within reach   Nurse Communication      Time: 1941-7408 OT Time Calculation (min): 28 min  Charges: OT General Charges $OT Visit: 1 Visit OT Treatments $Neuromuscular Re-education: 23-37 mins  Elverda Wendel OTR/L acute rehab services Office: [phone]    Wilhemena Durie 07/07/2020, 2:44 PM

## 2020-07-08 ENCOUNTER — Other Ambulatory Visit: Payer: Self-pay

## 2020-07-08 LAB — BASIC METABOLIC PANEL
Anion gap: 12 (ref 5–15)
BUN: 22 mg/dL — ABNORMAL HIGH (ref 6–20)
CO2: 24 mmol/L (ref 22–32)
Calcium: 9.9 mg/dL (ref 8.9–10.3)
Chloride: 101 mmol/L (ref 98–111)
Creatinine, Ser: 1.02 mg/dL (ref 0.61–1.24)
GFR, Estimated: >60 mL/min (ref >60)
Glucose, Bld: 119 mg/dL — ABNORMAL HIGH (ref 70–99)
Potassium: 3.7 mmol/L (ref 3.5–5.1)
Sodium: 137 mmol/L (ref 135–145)

## 2020-07-08 LAB — CBC
HCT: 42.9 % (ref 39.0–52.0)
Hemoglobin: 15.3 g/dL (ref 13.0–17.0)
MCH: 32.3 pg (ref 26.0–34.0)
MCHC: 35.7 g/dL (ref 30.0–36.0)
MCV: 90.5 fL (ref 80.0–100.0)
Platelets: 221 10*3/uL (ref 150–400)
RBC: 4.74 MIL/uL (ref 4.22–5.81)
RDW: 11.5 % (ref 11.5–15.5)
WBC: 13.6 10*3/uL — ABNORMAL HIGH (ref 4.0–10.5)
nRBC: 0 % (ref 0.0–0.2)

## 2020-07-08 MED ORDER — OXYCODONE HCL 5 MG PO TABS
5.0000 mg | ORAL_TABLET | Freq: Once | ORAL | Status: AC
  Administered 2020-07-08: 5 mg via ORAL
  Filled 2020-07-08: qty 1

## 2020-07-08 NOTE — Plan of Care (Signed)
  Problem: Education: Goal: Knowledge of General Education information will improve Description Including pain rating scale, medication(s)/side effects and non-pharmacologic comfort measures Outcome: Progressing   Problem: Health Behavior/Discharge Planning: Goal: Ability to manage health-related needs will improve Outcome: Progressing   

## 2020-07-08 NOTE — Progress Notes (Signed)
STROKE TEAM PROGRESS NOTE   INTERVAL HISTORY No events overnight, pending CIR  Vitals:   07/07/20 2027 07/08/20 0008 07/08/20 0458 07/08/20 0847  BP: 116/64 118/83 111/80 125/87  Pulse: (!) 102 88 88 95  Resp: 18 16 18 18   Temp: 98.3 F (36.8 C) 98.8 F (37.1 C) 98.1 F (36.7 C) 98.3 F (36.8 C)  TempSrc: Oral Oral Oral Oral  SpO2: 99% 99% 100% 99%  Weight:      Height:       CBC:  Recent Labs  Lab 07/03/20 1344 07/03/20 2047 07/07/20 0158 07/08/20 0224  WBC 10.5   < > 11.2* 13.6*  NEUTROABS 6.3  --   --   --   HGB 15.6   < > 14.7 15.3  HCT 44.8   < > 41.3 42.9  MCV 90.9   < > 89.8 90.5  PLT 238   < > 192 221   < > = values in this interval not displayed.   Basic Metabolic Panel:  Recent Labs  Lab 07/07/20 0158 07/08/20 0224  NA 136 137  K 3.8 3.7  CL 102 101  CO2 22 24  GLUCOSE 110* 119*  BUN 15 22*  CREATININE 1.00 1.02  CALCIUM 9.8 9.9   Lipid Panel:  Recent Labs  Lab 07/04/20 0358 07/04/20 0358 07/06/20 0542  CHOL 293*  --   --   TRIG 823*  854*   < > 479*  HDL 29*  --   --   CHOLHDL 10.1  --   --   VLDL UNABLE TO CALCULATE IF TRIGLYCERIDE OVER 400 mg/dL  --   --   LDLCALC UNABLE TO CALCULATE IF TRIGLYCERIDE OVER 400 mg/dL  --   --    < > = values in this interval not displayed.   HgbA1c:  Recent Labs  Lab 07/04/20 0358  HGBA1C 5.6   Urine Drug Screen:  Recent Labs  Lab 07/03/20 1738  LABOPIA NONE DETECTED  COCAINSCRNUR NONE DETECTED  LABBENZ NONE DETECTED  AMPHETMU NONE DETECTED  THCU NONE DETECTED  LABBARB NONE DETECTED    Alcohol Level  Recent Labs  Lab 07/03/20 1344  ETH <10    IMAGING past 24 hours No results found.  PHYSICAL EXAM stable  Temp:  [98 F (36.7 C)-98.8 F (37.1 C)] 98.3 F (36.8 C) (10/23 0847) Pulse Rate:  [88-102] 95 (10/23 0847) Resp:  [16-18] 18 (10/23 0847) BP: (111-127)/(64-87) 125/87 (10/23 0847) SpO2:  [97 %-100 %] 99 % (10/23 0847)  General - Well nourished, well developed, not in  acute distress.  Ophthalmologic - fundi not visualized due to noncooperation.  Cardiovascular - Regular rhythm and rate.  Neuro stable - awake alert eyes open. Orientated to place, time and age. Paucity of speech, able to name 3/3 and repeat in dysarthric voice.  Mild to moderate dysarthria. Following simple commands. Visual field full, able to have bilateral gaze. Left facial droop, tongue protrusion to the left. Left UE proximal 3/5, 3+/5 bicep, 2/5 tricep, 2/5 finger movement. LLE 4+/5 proximal and distal. RUE and RLE 5/5. Sensation subjectively symmetrical. RUE FTN intact. Gait not tested.    ASSESSMENT/PLAN [name] is a 34 y.o. 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 Found to have L sided weakness w/ neglect and R gaze on arrival to ED. CTH w/ hyperdense R MCA w/  w/ cervical R ICA occlusion and ASPECTS 8. Received IV tPA 07/03/2020 at 1356 and sent to IR.  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 Suspect R MCA thrombus w/ acute infarct. ASPECTS 8.    CTA head & neck cervical R ICA occlusion 2cm above origin w/ reconstitution and subsequent occlusion proximal R M1 extending into bifurcation   Cerebral angio / IR extracranial R ICA/terminus, R MCA occlusion w/ TICI3 revascularization. Possible R ICA dissection  Repeat CT head dense contrast staining posterior R lentiform (no hemorrhage). New/increased edema R basal ganglia, R insula, anterior temporal lobe w/ mild mass effect R lateral ventricle. 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  Since then, he has quit all of them.  Acute Respiratory Failure, resolved  Secondary to stroke  Intubated for IR, left intubated post IR per anesthesia  Extubated 10/19 w/o difficulty  Hyperlipidemia  Home meds:  No statin  TG 823->479, direct LDL 151, goal < 70  On lipitor 80    Continue statin at discharge  Dysphagia   . Secondary to stroke . Has a swallow after modified barium study . Dys 1 with nectar thick liquid . Speech on board . On IVF @ 50    Other Stroke Risk Factors  Obesity, Body mass index is 34.17 kg/m., recommend weight loss, diet and exercise as appropriate   Other Active Problems  Leukocytosis - WBC 16.1->9.2->9.3->11.2->13.6 (afebrile) If fever develops will check U/A and CXR  Awaiting CIR bed - per admissions coordinator - no beds currently available. She will F/U on Monday  Hospital day # 5  Personally examined patient and images, and have participated in and made any corrections needed to history, physical, neuro exam,assessment and plan as stated above.  I have personally obtained the history, evaluated lab date, reviewed imaging studies and agree with radiology interpretations.    Naomie Dean, MD Stroke Neurology  I spent 25  minutes of face-to-face and non-face-to-face time with patient. This included prechart review, lab review, study review, order entry, electronic health record documentation, patient education on the different diagnostic and therapeutic options, counseling and coordination of care, risks and benefits of management, compliance, or risk factor reduction     To contact Stroke Continuity provider, please refer to [email or web address]. After hours, contact General Neurology

## 2020-07-09 NOTE — Progress Notes (Signed)
STROKE TEAM PROGRESS NOTE   INTERVAL HISTORY No events overnight, pending CIR  Vitals:   07/08/20 2240 07/08/20 2340 07/09/20 0329 07/09/20 0829  BP: 131/85 121/79 116/68 116/76  Pulse: 100 96 95 99  Resp:  (!) 22 (!) 22 (!) 21  Temp: 98.4 F (36.9 C) 98.3 F (36.8 C) 98.2 F (36.8 C) 99.2 F (37.3 C)  TempSrc: Oral Oral Oral Oral  SpO2: 98% 98% 98% 98%  Weight:      Height:       CBC:  Recent Labs  Lab 07/03/20 1344 07/03/20 2047 07/07/20 0158 07/08/20 0224  WBC 10.5   < > 11.2* 13.6*  NEUTROABS 6.3  --   --   --   HGB 15.6   < > 14.7 15.3  HCT 44.8   < > 41.3 42.9  MCV 90.9   < > 89.8 90.5  PLT 238   < > 192 221   < > = values in this interval not displayed.   Basic Metabolic Panel:  Recent Labs  Lab 07/07/20 0158 07/08/20 0224  NA 136 137  K 3.8 3.7  CL 102 101  CO2 22 24  GLUCOSE 110* 119*  BUN 15 22*  CREATININE 1.00 1.02  CALCIUM 9.8 9.9   Lipid Panel:  Recent Labs  Lab 07/04/20 0358 07/04/20 0358 07/06/20 0542  CHOL 293*  --   --   TRIG 823*  854*   < > 479*  HDL 29*  --   --   CHOLHDL 10.1  --   --   VLDL UNABLE TO CALCULATE IF TRIGLYCERIDE OVER 400 mg/dL  --   --   LDLCALC UNABLE TO CALCULATE IF TRIGLYCERIDE OVER 400 mg/dL  --   --    < > = values in this interval not displayed.   HgbA1c:  Recent Labs  Lab 07/04/20 0358  HGBA1C 5.6   Urine Drug Screen:  Recent Labs  Lab 07/03/20 1738  LABOPIA NONE DETECTED  COCAINSCRNUR NONE DETECTED  LABBENZ NONE DETECTED  AMPHETMU NONE DETECTED  THCU NONE DETECTED  LABBARB NONE DETECTED    Alcohol Level  Recent Labs  Lab 07/03/20 1344  ETH <10    IMAGING past 24 hours No results found.  PHYSICAL EXAM stable  Temp:  [98.2 F (36.8 C)-99.2 F (37.3 C)] 99.2 F (37.3 C) (10/24 0829) Pulse Rate:  [90-104] 99 (10/24 0829) Resp:  [16-22] 21 (10/24 0829) BP: (109-136)/(67-85) 116/76 (10/24 0829) SpO2:  [94 %-98 %] 98 % (10/24 0829)  General - Well nourished, well developed, not  in acute distress.  Ophthalmologic - fundi not visualized due to noncooperation.  Cardiovascular - Regular rhythm and rate.  Neuro stable - awake alert eyes open. Orientated to place, time and age. Paucity of speech, able to name 3/3 and repeat in dysarthric voice.  Mild to moderate dysarthria. Following simple commands. Visual field full, able to have bilateral gaze. Left facial droop, tongue protrusion to the left. Left UE proximal 3/5, 3+/5 bicep, 2/5 tricep, 2/5 finger movement. LLE 4+/5 proximal and distal. RUE and RLE 5/5. Sensation subjectively symmetrical. RUE FTN intact. Gait not tested.    ASSESSMENT/PLAN [name] is a 34 y.o. 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 Found to have L sided weakness w/ neglect and R gaze on arrival to ED. CTH w/ hyperdense R  MCA w/ w/ cervical R ICA occlusion and ASPECTS 8. Received IV tPA 07/03/2020 at 1356 and sent to IR.  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 Suspect R MCA thrombus w/ acute infarct. ASPECTS 8.    CTA head & neck cervical R ICA occlusion 2cm above origin w/ reconstitution and subsequent occlusion proximal R M1 extending into bifurcation   Cerebral angio / IR extracranial R ICA/terminus, R MCA occlusion w/ TICI3 revascularization. Possible R ICA dissection  Repeat CT head dense contrast staining posterior R lentiform (no hemorrhage). New/increased edema R basal ganglia, R insula, anterior temporal lobe w/ mild mass effect R lateral ventricle. 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 No  seizures or epileptiform discharges were seen throughout the recording.  2D Echo EF 60-65%  TEE No SOE, neg bubble  TG 823, direct LDL 151   HgbA1c - 5.6  UDS - negative  Hypercoagulable labs neg thus far only ACL IgM 16 slightly elevated  VTE prophylaxis - Lovenox 40 mg sq daily   aspirin 81 mg daily prior to admission, now on aspirin 81 and the Plavix 75 DAPT for 3 weeks and then Plavix alone.  Therapy recommendations:  CIR - awaiting bed  Disposition:  Pending - medically ready for d/c, pending CIR bed availability  Seizure  Witnessed at stroke onset  Seizure in CT  Treated w/ ativan  Loaded w/ Keppra 1500  Now on Keppra 500 q 12h  EEG - 10/19 - Continuous slow, generalized suggestive of severe diffuse encephalopathy, nonspecific etiology. No seizures or epileptiform discharges were seen throughout the recording.  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  Since then, he has quit all of them.  Acute Respiratory Failure, resolved  Secondary to stroke  Intubated for IR, left intubated post IR per anesthesia  Extubated 10/19 w/o difficulty  Hyperlipidemia  Home meds:  No statin  TG 823->479, direct LDL 151, goal < 70  On lipitor 80    Continue statin at discharge  Dysphagia   . Secondary to stroke . Has a swallow after modified barium study . Dys 1 with nectar thick liquid . Speech on board . On IVF @ 49    Other Stroke Risk Factors  Obesity, Body mass index is 34.17 kg/m., recommend weight loss, diet and exercise as appropriate   Other Active Problems  Leukocytosis - WBC 16.1->9.2->9.3->11.2->13.6 (T Max 99.2) If fever develops will check U/A and CXR. Recheck CBC Monday.  Awaiting CIR bed - per admissions coordinator - no beds currently available. She will F/U on  Monday  Hospital day # 6 Personally examined patient and images, and have participated in and made any corrections needed to history, physical, neuro exam,assessment and plan as stated above.  I have personally obtained the history, evaluated lab date, reviewed imaging studies and agree with radiology interpretations.    Naomie Dean, MD Stroke Neurology  I spent 15 minutes of face-to-face and non-face-to-face time with patient. This included prechart review, lab review, study  review, order entry, electronic health record documentation, patient education on the different diagnostic and therapeutic options, counseling and coordination of care, risks and benefits of management, compliance, or risk factor reduction     To contact Stroke Continuity provider, please refer to [email or web address]. After hours, contact General Neurology

## 2020-07-09 NOTE — Plan of Care (Signed)
  Problem: Education: Goal: Knowledge of General Education information will improve Description: Including pain rating scale, medication(s)/side effects and non-pharmacologic comfort measures 07/09/2020 0800 by Dallas Breeding, RN Outcome: Progressing 07/09/2020 0800 by Dallas Breeding, RN Outcome: Progressing

## 2020-07-09 NOTE — Plan of Care (Signed)
  Problem: Education: Goal: Knowledge of General Education information will improve Description Including pain rating scale, medication(s)/side effects and non-pharmacologic comfort measures Outcome: Progressing   Problem: Health Behavior/Discharge Planning: Goal: Ability to manage health-related needs will improve Outcome: Progressing   

## 2020-07-10 ENCOUNTER — Inpatient Hospital Stay (HOSPITAL_COMMUNITY): Payer: Medicaid Other

## 2020-07-10 LAB — CBC
HCT: 37 % — ABNORMAL LOW (ref 39.0–52.0)
Hemoglobin: 13.1 g/dL (ref 13.0–17.0)
MCH: 32 pg (ref 26.0–34.0)
MCHC: 35.4 g/dL (ref 30.0–36.0)
MCV: 90.2 fL (ref 80.0–100.0)
Platelets: 236 10*3/uL (ref 150–400)
RBC: 4.1 MIL/uL — ABNORMAL LOW (ref 4.22–5.81)
RDW: 11.3 % — ABNORMAL LOW (ref 11.5–15.5)
WBC: 13.5 10*3/uL — ABNORMAL HIGH (ref 4.0–10.5)
nRBC: 0 % (ref 0.0–0.2)

## 2020-07-10 LAB — URINALYSIS, ROUTINE W REFLEX MICROSCOPIC
Bacteria, UA: NONE SEEN
Bilirubin Urine: NEGATIVE
Glucose, UA: NEGATIVE mg/dL
Hgb urine dipstick: NEGATIVE
Ketones, ur: NEGATIVE mg/dL
Leukocytes,Ua: NEGATIVE
Nitrite: NEGATIVE
Protein, ur: 30 mg/dL — AB
Specific Gravity, Urine: 1.03 (ref 1.005–1.030)
pH: 5 (ref 5.0–8.0)

## 2020-07-10 MED ORDER — DICLOFENAC EPOLAMINE 1.3 % EX PTCH
1.0000 | MEDICATED_PATCH | Freq: Two times a day (BID) | CUTANEOUS | Status: DC
  Administered 2020-07-10 (×2): 1 via TRANSDERMAL
  Filled 2020-07-10 (×6): qty 1

## 2020-07-10 MED ORDER — LIDOCAINE 5 % EX PTCH
1.0000 | MEDICATED_PATCH | Freq: Every day | CUTANEOUS | Status: DC
  Filled 2020-07-10 (×4): qty 1

## 2020-07-10 MED ORDER — OXYCODONE HCL 5 MG PO TABS
5.0000 mg | ORAL_TABLET | Freq: Once | ORAL | Status: AC | PRN
  Administered 2020-07-10: 5 mg via ORAL
  Filled 2020-07-10: qty 1

## 2020-07-10 NOTE — Progress Notes (Signed)
ID: This is a 34 year old man who presented with right MCA occlusion status post TPA and IA with a past medical history significant for embolic stroke of unknown etiology (right superior cerebellar artery, 05/2019, in the setting of heavy cocaine, alcohol and smoking which he has quit), work-up to date notable for ACL IgM 16 slightly elevated, being treated with dual antiplatelet therapy (plan for 3-week course followed by Plavix alone).  He is additionally on Keppra 500 mg twice daily for seizure at stroke onset.   Recent course has been notable for rising leukocytosis (11.2->13.6)  I was paged to evaluate back pain.  The patient reports a 3-day progressive history of back pain that is currently severe (10/10), improved with laying still, worse with movement, improved with his wife giving him a back rub yesterday as well as applying a lidocaine patch (brought from home, not recorded in our EMR).  The pain is predominantly in the right upper to mid back.  It is also worsened by deep inspiration.  Pain continues to be relieved by strong massage.  There is equivocal midline tenderness on palpation of the back.  His strength is remained stable per his nurse, though he is reluctant to participate in a significant examination with me given the pain.  He is mildly tachycardic and tachypneic, but his blood pressures have remained stable.  Notably he had a temperature 100.2 charted at 1606, despite administration of Tylenol 650 mg given at 1507; another 650 mg was administered at 2009.  His most recent temperature is 99.6.  I am concerned that he may be having a fever masked by Tylenol usage.  I have asked the nurse to hold further Tylenol overnight and given the patient a one-time dose of oxycodone 5 mg for pain management, ordered diclofenac patch for his back and ordered CXR and UA as planned by day team in case of fever.   Brooke Dare MD-PhD [facility] [phone]

## 2020-07-10 NOTE — Progress Notes (Signed)
Physical Therapy Treatment Patient Details Name: Mark Oneal MRN: [id number] DOB: 09-27-1985 Today's Date: 07/10/2020    History of Present Illness Mark Oneal is a 34 y.o. 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 Pt found to have occluded R ICA, MCA, and ACA requiring thrombectomy by IR. Pt also received tPA.    PT Comments    Pt reports mild back pain upon PT arrival and min A given with bed mobility to help control this. Pt was able to increase ambulation distance today but not safety. Within 9' stretch of ambulation, can ambulate 20' with min A and RW but then needs mod A to make pt stop, correct form, attend to L side and reposition self away from obstacles on L side. Continue to recommend CIR for intense rehab for physical function and cognition. PT will continue to follow.    Follow Up Recommendations  CIR     Equipment Recommendations  Cane    Recommendations for Other Services Rehab consult     Precautions / Restrictions Precautions Precautions: Fall Precaution Comments: L inattention Restrictions Weight Bearing Restrictions: No    Mobility  Bed Mobility Overal bed mobility: Needs Assistance Bed Mobility: Supine to Sit Rolling:  (cues required for sequencing)   Supine to sit: Min assist     General bed mobility comments: min HHA, could likely have come to EOB without physical assist but was having LBP so HHA given to help manage this  Transfers Overall transfer level: Needs assistance Equipment used: Rolling walker (2 wheeled)   Sit to Stand: Mod assist         General transfer comment: repeated cues needed for hand placement and body position. Min A for fwd wt shift. Performed 2x  Ambulation/Gait Ambulation/Gait assistance: Min assist;Mod assist Gait Distance (Feet): 50 Feet (3 bouts) Assistive  device: Rolling walker (2 wheeled) Gait Pattern/deviations: Step-to pattern;Decreased dorsiflexion - left Gait velocity: reduced Gait velocity interpretation: <1.8 ft/sec, indicate of risk for recurrent falls General Gait Details: impulsive with mvmt and not following commands to stop and correct posture, necessitating mod A at times for safety. Once reset with L hand on RW, he could continue about 20' with min A before needing mod A again.    Stairs             Wheelchair Mobility    Modified Rankin (Stroke Patients Only) Modified Rankin (Stroke Patients Only) Pre-Morbid Rankin Score: No significant disability Modified Rankin: Moderately severe disability     Balance Overall balance assessment: Needs assistance Sitting-balance support: Single extremity supported;Feet supported Sitting balance-Leahy Scale: Fair Sitting balance - Comments: able to maintain static balance EOB but loss of balance with wt shifting   Standing balance support: During functional activity;No upper extremity supported Standing balance-Leahy Scale: Poor Standing balance comment: min-modA for standing balance without UE support                            Cognition Arousal/Alertness: Awake/alert Behavior During Therapy: WFL for tasks assessed/performed Overall Cognitive Status: Impaired/Different from baseline Area of Impairment: Attention;Memory;Following commands;Safety/judgement;Awareness;Problem solving                   Current Attention Level: Sustained Memory: Decreased recall of precautions Following Commands: Follows one step commands consistently;Follows one step commands with  increased time Safety/Judgement: Decreased awareness of deficits;Decreased awareness of safety Awareness: Emergent Problem Solving: Slow processing;Decreased initiation;Difficulty sequencing General Comments: continued decreased attention to L side but corrected partially when cued. Impulsive with  decreased insight into deficits and lack of safety. Very focused on ahowing me that he can move his LUE some after session today      Exercises      General Comments        Pertinent Vitals/Pain Pain Assessment: Faces Faces Pain Scale: Hurts little more Pain Location: low back  Pain Descriptors / Indicators: Guarding;Grimacing Pain Intervention(s): Limited activity within patient's tolerance;Monitored during session    Home Living                      Prior Function            PT Goals (current goals can now be found in the care plan section) Acute Rehab PT Goals Patient Stated Goal: get better PT Goal Formulation: With patient Time For Goal Achievement: 07/18/20 Potential to Achieve Goals: Good Progress towards PT goals: Progressing toward goals    Frequency    Min 4X/week      PT Plan Current plan remains appropriate    Co-evaluation              AM-PAC PT "6 Clicks" Mobility   Outcome Measure  Help needed turning from your back to your side while in a flat bed without using bedrails?: A Little Help needed moving from lying on your back to sitting on the side of a flat bed without using bedrails?: A Little Help needed moving to and from a bed to a chair (including a wheelchair)?: A Lot Help needed standing up from a chair using your arms (e.g., wheelchair or bedside chair)?: A Little Help needed to walk in hospital room?: A Little Help needed climbing 3-5 steps with a railing? : Total 6 Click Score: 15    End of Session Equipment Utilized During Treatment: Gait belt Activity Tolerance: Patient tolerated treatment well Patient left: in chair;with call bell/phone within reach;with chair alarm set Nurse Communication: Mobility status PT Visit Diagnosis: Unsteadiness on feet (R26.81);Muscle weakness (generalized) (M62.81);Difficulty in walking, not elsewhere classified (R26.2)     Time: 1610-9604 PT Time Calculation (min) (ACUTE ONLY): 18  min  Charges:  $Gait Training: 8-22 mins                     Lyanne Co, PT  Acute Rehab Services  Pager [phone] Office [phone]    Lawana Chambers Peder Allums 07/10/2020, 11:19 AM

## 2020-07-10 NOTE — H&P (Signed)
Physical Medicine and Rehabilitation Admission H&P    Chief Complaint  Patient presents with  . Stroke with functional deficits.     HPI: Mark Oneal is a 34 year old male with history of prior R-SCA infarct who was admitted on 07/03/2020 after a fall with resulting dysarthria and "jerking" of BUE/BLE.  History taken from chart review and patient.  On evaluation by EMS, he was found to have right gaze deviation and left hemiparesis.  He was loaded with Keppra with recommendations to continue this until clinic follow-up.  UDS negative.  CT head showed hyperdense right MCA sign and CTA head/neck showed occlusion of right-ICA 2 cm of the origin with reconstitution by PCA and subsequent occlusion of proximal right M1 MCA.  He underwent cerebral angiogram with complete revascularization of occluded right ICA and ICA terminus, right MCA and right-ACA with question of proximal right ICA dissection.  Follow-up CT head showed left basal ganglia/caudate head infarct with contrast brain--question bleed and mild cerebral edema.  He was started on hypertonic saline.  EEG showing severe diffuse encephalopathy, negative for seizures he tolerated extubation on 07/04/2020.  Hypercoagulability panel showed slight elevation in ACL IgM.  TEE negative for PFO or cardiac source of emboli.  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  Dr. Pearlean Brownie felt the stroke was embolic due to unknown source versus possible dissection and patient to continue DAPT x3 weeks followed by Plavix alone.  Hospital course further complicated by dysphagia.  MBS done and he has been advanced to dysphagia 3, nectar liquids. Patient has had issues with leukocytosis as well as low-grade fevers with tachypnea likely due to aspiration pneumonia with follow-up chest x-ray showing LLL pneumonia and patient started on cefdinir on 10/26.  Please see  preadmission assessment from earlier today as well.  Review of Systems  Constitutional: Negative for chills and fever.  HENT: Negative for hearing loss.   Eyes: Negative for blurred vision and double vision.  Respiratory: Negative for cough and shortness of breath.   Cardiovascular: Negative for chest pain and palpitations.  Gastrointestinal: Negative for constipation, diarrhea, heartburn and nausea.  Genitourinary: Negative for dysuria and urgency.  Musculoskeletal: Negative for back pain, joint pain and neck pain.  Skin: Negative for rash.  Neurological: Positive for speech change, focal weakness and weakness. Negative for dizziness and sensory change.  Psychiatric/Behavioral: The patient is not nervous/anxious and does not have insomnia.   All other systems reviewed and are negative.   Past Medical History:  Diagnosis Date  . 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 BUBBLE STUDY  07/06/2020   Procedure: BUBBLE STUDY;  Surgeon: Little Ishikawa, MD;  Location: [facility] ENDOSCOPY;  Service: Cardiovascular;;  . IR ANGIO INTRA EXTRACRAN SEL COM CAROTID INNOMINATE UNI L MOD SED  07/03/2020  . IR CT HEAD LTD  07/03/2020  . IR CT HEAD LTD  07/03/2020  . IR PERCUTANEOUS ART THROMBECTOMY/INFUSION INTRACRANIAL INC DIAG ANGIO  07/03/2020  . IR US GUIDE VASC ACCESS RIGHT  07/03/2020  . RADIOLOGY WITH ANESTHESIA N/A 07/03/2020   Procedure: IR WITH ANESTHESIA;  Surgeon: Radiologist, Medication, MD;  Location: [facility] OR;  Service: Radiology;  Laterality: N/A;  . TEE WITHOUT CARDIOVERSION N/A 07/06/2020   Procedure: TRANSESOPHAGEAL ECHOCARDIOGRAM (TEE);  Surgeon: Little Ishikawa, MD;  Location: [facility] ENDOSCOPY;  Service:  Cardiovascular;  Laterality: N/A;    Family History  Problem Relation Age of Onset  . Hypertension Mother   . Hypertension Father     Social History: Married. Moved from [place] a few months ago. Works in  Holiday representative. He reports that he has quit smoking. His smoking use included cigarettes. He has never used smokeless tobacco. He reports current alcohol use of about 3-20 ounces of beer daily. No history on file for drug use.    Allergies: No Known Allergies    Medications Prior to Admission  Medication Sig Dispense Refill  . aspirin EC 81 MG tablet Take 81 mg by mouth daily. Swallow whole.      Drug Regimen Review  Drug regimen was reviewed and remains appropriate with no significant issues identified  Home: Home Living Family/patient expects to be discharged to:: Private residence Living Arrangements: Spouse/significant other, Children Available Help at Discharge: Family, Available 24 hours/day (wife) Type of Home: House Home Access: Level entry Home Layout: One level Bathroom Shower/Tub: Associate Professor: Yes  Lives With: Spouse, Son, Daughter   Functional History: Prior Function Level of Independence: Independent Comments: works in Holiday representative; reports has a 43 y/o daughter and 2 y/o son  Functional Status:  Mobility: Bed Mobility Overal bed mobility: Needs Assistance Bed Mobility: Supine to Sit Rolling:  (cues required for sequencing) Sidelying to sit: Min guard Supine to sit: Supervision, HOB elevated Sit to supine: Min guard, HOB elevated General bed mobility comments: use of bed rail to pull up into sitting Transfers Overall transfer level: Needs assistance Equipment used: Rolling walker (2 wheeled) Transfers: Sit to/from Stand Sit to Stand: Min guard Stand pivot transfers: Min guard  Lateral/Scoot Transfers: Min guard General transfer comment: PT assistance for safety, cues for hand placement, pt requries multiple cues to initiate Ambulation/Gait Ambulation/Gait assistance: Min assist Gait Distance (Feet): 150 Feet Assistive device: Rolling walker (2 wheeled) Gait Pattern/deviations: Step-to pattern General  Gait Details: pt with step to gait, poor control and attention to L hand on RW. Pt initially ambulating with only R hand on walker and requires PT cues to place L hand back on walker during ambulation. Pt also requires multiple cues for direction, often attempting to walk into other pt's rooms thinking they are his own even though they are nowhere close to his room Gait velocity: reduced Gait velocity interpretation: <1.8 ft/sec, indicate of risk for recurrent falls    ADL: ADL Overall ADL's : Needs assistance/impaired Eating/Feeding: NPO Grooming: Moderate assistance, Sitting Upper Body Bathing: Moderate assistance, Sitting Lower Body Bathing: Maximal assistance, +2 for physical assistance, +2 for safety/equipment, Sit to/from stand Upper Body Dressing : Maximal assistance, Sitting Lower Body Dressing: Maximal assistance, +2 for physical assistance, +2 for safety/equipment, Sit to/from stand Lower Body Dressing Details (indicate cue type and reason): attempts to don socks at EOB but ultimately requiring assist including assist for sitting balance and to support LEs in modified figure 4 Toileting- Clothing Manipulation and Hygiene: Total assistance, +2 for physical assistance, +2 for safety/equipment, Sit to/from stand Functional mobility during ADLs: Maximal assistance, +2 for physical assistance, +2 for safety/equipment (sit<>Stand and side steps along EOB) General ADL Comments: focus on therapeutic activity seated EOB incorporating LUE  Cognition: Cognition Overall Cognitive Status: Impaired/Different from baseline Arousal/Alertness: Awake/alert Orientation Level: Oriented X4 Attention: Sustained Sustained Attention: Impaired Sustained Attention Impairment: Functional basic Awareness: Impaired Awareness Impairment: Emergent impairment, Intellectual impairment Problem Solving: Impaired Problem Solving Impairment: Functional basic Safety/Judgment:  Impaired Cognition  Arousal/Alertness: Awake/alert Behavior During Therapy: Impulsive Overall Cognitive Status: Impaired/Different from baseline Area of Impairment: Attention, Memory, Following commands, Safety/judgement, Awareness, Problem solving Current Attention Level: Sustained Memory: Decreased recall of precautions, Decreased short-term memory Following Commands: Follows one step commands with increased time Safety/Judgement: Decreased awareness of safety, Decreased awareness of deficits Awareness: Emergent Problem Solving: Slow processing, Decreased initiation, Difficulty sequencing General Comments: very slow processing, requires multiple cues to initiate at times   Blood pressure 107/69, pulse 78, temperature 98.3 F (36.8 C), temperature source Oral, resp. rate 18, height 5\' 4"  (1.626 m), weight 90.3 kg, SpO2 100 %. Physical Exam Vitals reviewed.  Constitutional:      General: He is not in acute distress.    Appearance: He is obese.  HENT:     Head: Normocephalic and atraumatic.     Right Ear: External ear normal.     Left Ear: External ear normal.     Nose: Nose normal.  Eyes:     General:        Right eye: No discharge.        Left eye: No discharge.     Extraocular Movements: Extraocular movements intact.  Cardiovascular:     Rate and Rhythm: Normal rate and regular rhythm.  Pulmonary:     Effort: Pulmonary effort is normal.     Breath sounds: Normal breath sounds.  Abdominal:     General: Abdomen is flat. Bowel sounds are normal. There is no distension.  Musculoskeletal:     Cervical back: Normal range of motion and neck supple.     Comments: No edema or tenderness in extremities  Skin:    General: Skin is warm and dry.  Neurological:     Mental Status: He is alert and oriented to person, place, and time.     Comments: Alert Motor: RUE: 5/5 proximal distal LUE: 4 --4/5 proximal distal LLE: 4-4+/5 proximal distal Sensation intact light touch Dysarthria   Psychiatric:        Mood and Affect: Mood normal.        Behavior: Behavior normal.     Results for orders placed or performed during the hospital encounter of 07/03/20 (from the past 48 hour(s))  CBC     Status: Abnormal   Collection Time: 07/11/20  4:57 AM  Result Value Ref Range   WBC 12.3 (H) 4.0 - 10.5 K/uL   RBC 3.66 (L) 4.22 - 5.81 MIL/uL   Hemoglobin 11.6 (L) 13.0 - 17.0 g/dL   HCT 07/13/20 (L) 39 - 52 %   MCV 90.7 80.0 - 100.0 fL   MCH 31.7 26.0 - 34.0 pg   MCHC 34.9 30.0 - 36.0 g/dL   RDW 34.7 (L) 42.5 - 95.6 %   Platelets 259 150 - 400 K/uL   nRBC 0.0 0.0 - 0.2 %    Comment: Performed at [facility], [address], [place], [place]  Basic metabolic panel     Status: Abnormal   Collection Time: 07/11/20  4:57 AM  Result Value Ref Range   Sodium 135 135 - 145 mmol/L   Potassium 3.8 3.5 - 5.1 mmol/L   Chloride 100 98 - 111 mmol/L   CO2 26 22 - 32 mmol/L   Glucose, Bld 111 (H) 70 - 99 mg/dL    Comment: Glucose reference range applies only to samples taken after fasting for at least 8 hours.   BUN 16 6 - 20 mg/dL   Creatinine, Ser 07/13/20  0.61 - 1.24 mg/dL   Calcium 9.0 8.9 - 62.2 mg/dL   GFR, Estimated >29 >79 mL/min    Comment: (NOTE) Calculated using the CKD-EPI Creatinine Equation (2021)    Anion gap 9 5 - 15    Comment: Performed at [facility], [address] [place], [place] [address]   DG Chest 2 View  Result Date: 07/11/2020 CLINICAL DATA:  Recent infiltrate left base. EXAM: CHEST - 2 VIEW COMPARISON:  July 10, 2020 FINDINGS: Ill-defined airspace opacity remains in the left lower lung region. The lungs elsewhere are clear. Heart is upper normal in size with pulmonary vascularity normal. No adenopathy. No bone lesions. IMPRESSION: Ill-defined opacity remains in left lower lobe concerning for pneumonia in this area. Lungs elsewhere clear. Heart upper normal in size. No evident adenopathy. Electronically Signed   By: Bretta Bang III  M.D.   On: 07/11/2020 08:19       Medical Problem List and Plan: 1.  Left hemiparesis, deficits with mobility, transfers, self-care secondary to left basal ganglia/caudate head infarct.  -patient may shower  -ELOS/Goals: 8-12 days/Mod I/Supervision  Admit to CIR 2.  Antithrombotics: -DVT/anticoagulation:  Pharmaceutical: Lovenox  -antiplatelet therapy: DAPT x 3 weeks--d/c ASA 11/10 followed by Plavix alone 3. Pain Management: N/A 4. Mood: LCSW to follow for evaluation and support. Motivated to get better and back to his children.   -antipsychotic agents: N/A 5. Neuropsych: This patient is capable of making decisions on his own behalf. 6. Skin/Wound Care: Routine pressure relief measures.  7. Fluids/Electrolytes/Nutrition: Monitor I/O. Encourage nectar liquids intake.  8. 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 No driving till seizure free X 6 months.  9. Aspiration pneumonia: On cefdinir D #2/7-14. Monitor for fevers/WBC trend 10.  Post stroke dysphagia: On dysphagia 3, nectar liquids. Discontinue IVF encourage fluid intake. Cotinue  11.  Acute blood loss anemia: Will monitor H/H for stability and monitor for signs of bleeding.   CBC ordered for tomorrow a.m. 12.  Obesity  Encourage weight loss  Jacquelynn Cree, PA-C 07/12/2020  I have personally performed a face to face diagnostic evaluation, including, but not limited to relevant history and physical exam findings, of this patient and developed relevant assessment and plan.  Additionally, I have reviewed and concur with the physician assistant's documentation above.  Maryla Morrow, MD, ABPMR

## 2020-07-10 NOTE — Progress Notes (Signed)
Inpatient Rehabilitation Admissions Coordinator  Noted febrile over the weekend. Disused with Dr. Pearlean Brownie. I await Cir bed availability when pt felt medically ready for discharge. I updated pt and he is in agreement.  Ottie Glazier, RN, MSN Rehab Admissions Coordinator [phone] 07/10/2020 11:48 AM

## 2020-07-10 NOTE — Progress Notes (Signed)
STROKE TEAM PROGRESS NOTE   INTERVAL HISTORY Patient is sitting up in bed. He states is doing well. He has no complaints. Vital signs are stable. Neuro exam is unchanged. He has been evaluated by therapist who recommended inpatient rehab but no bed is available. He is sitting up in a bedside chair. He had low-grade fever with elevated white count of 13.5. Chest x-ray was a single view and shows bilateral basilar atelectasis versus infiltrates.  Vitals:   07/10/20 0000 07/10/20 0333 07/10/20 0533 07/10/20 0733  BP: (!) 137/94 129/86 121/73 130/83  Pulse: 96 (!) 102 (!) 102 (!) 106  Resp: 20 (!) 24  (!) 25  Temp: 98.7 F (37.1 C) 99.6 F (37.6 C) 99 F (37.2 C) (!) 100.4 F (38 C)  TempSrc: Oral Oral Oral Oral  SpO2: 98% 97% 96% 97%  Weight:      Height:       CBC:  Recent Labs  Lab 07/03/20 1344 07/03/20 2047 07/08/20 0224 07/10/20 0408  WBC 10.5   < > 13.6* 13.5*  NEUTROABS 6.3  --   --   --   HGB 15.6   < > 15.3 13.1  HCT 44.8   < > 42.9 37.0*  MCV 90.9   < > 90.5 90.2  PLT 238   < > 221 236   < > = values in this interval not displayed.   Basic Metabolic Panel:  Recent Labs  Lab 07/07/20 0158 07/08/20 0224  NA 136 137  K 3.8 3.7  CL 102 101  CO2 22 24  GLUCOSE 110* 119*  BUN 15 22*  CREATININE 1.00 1.02  CALCIUM 9.8 9.9   Lipid Panel:  Recent Labs  Lab 07/04/20 0358 07/04/20 0358 07/06/20 0542  CHOL 293*  --   --   TRIG 823*  854*   < > 479*  HDL 29*  --   --   CHOLHDL 10.1  --   --   VLDL UNABLE TO CALCULATE IF TRIGLYCERIDE OVER 400 mg/dL  --   --   LDLCALC UNABLE TO CALCULATE IF TRIGLYCERIDE OVER 400 mg/dL  --   --    < > = values in this interval not displayed.   HgbA1c:  Recent Labs  Lab 07/04/20 0358  HGBA1C 5.6   Urine Drug Screen:  Recent Labs  Lab 07/03/20 1738  LABOPIA NONE DETECTED  COCAINSCRNUR NONE DETECTED  LABBENZ NONE DETECTED  AMPHETMU NONE DETECTED  THCU NONE DETECTED  LABBARB NONE DETECTED    Alcohol Level  Recent  Labs  Lab 07/03/20 1344  ETH <10    IMAGING past 24 hours DG CHEST PORT 1 VIEW  Result Date: 07/10/2020 CLINICAL DATA:  Fever EXAM: PORTABLE CHEST 1 VIEW COMPARISON:  07/04/2020 FINDINGS: Endotracheal tube is been removed. There is atelectasis and or infiltrate in both lower lobes since the previous exam. Upper lungs are clear. No visible effusion. IMPRESSION: Development of atelectasis and or infiltrate in both lower lobes. Endotracheal tube removed. Electronically Signed   By: Paulina Fusi M.D.   On: 07/10/2020 07:27    PHYSICAL EXAM   Temp:  [98.1 F (36.7 C)-100.4 F (38 C)] 100.4 F (38 C) (10/25 0733) Pulse Rate:  [96-106] 106 (10/25 0733) Resp:  [20-28] 25 (10/25 0733) BP: (109-137)/(73-94) 130/83 (10/25 0733) SpO2:  [96 %-99 %] 97 % (10/25 0733)  General -mildly obese young African-American male, not in acute distress.  Ophthalmologic - fundi not visualized due to noncooperation.  Cardiovascular -  Regular rhythm and rate.  Neuro stable - awake alert eyes open. Oriented to place, time and age. Paucity of speech, able to name 3/3 and  Mild dysarthria. Following simple commands. Visual field full, able to have bilateral gaze. Left facial droop, tongue protrusion to the left. Left UE proximal 3/5, 3+/5 bicep, 2/5 tricep, 2/5 finger movement. LLE 4+/5 proximal and distal. RUE and RLE 5/5. Sensation subjectively symmetrical. RUE FTN intact. Gait not tested.    ASSESSMENT/PLAN [name] is a 34 y.o. 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 Found to have L sided weakness w/ neglect and R gaze on arrival to ED. CTH w/ hyperdense R MCA w/ w/ cervical R ICA occlusion and ASPECTS 8. Received IV tPA 07/03/2020 at 1356 and sent to IR.  Stroke:   R MCA infarct due to right ICA and MCA occlusion s/p tPA and IR w/ TICI3 revascularization, embolic, secondary to  unknown source vs possible dissection    Code Stroke CT head No acute hemorrhage. Suspect R MCA thrombus w/ acute infarct. ASPECTS 8.    CTA head & neck cervical R ICA occlusion 2cm above origin w/ reconstitution and subsequent occlusion proximal R M1 extending into bifurcation   Cerebral angio / IR extracranial R ICA/terminus, R MCA occlusion w/ TICI3 revascularization. Possible R ICA dissection  Repeat CT head dense contrast staining posterior R lentiform (no hemorrhage). New/increased edema R basal ganglia, R insula, anterior temporal lobe w/ mild mass effect R lateral ventricle. 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 No seizures or epileptiform discharges were seen throughout the recording.  2D Echo EF 60-65%  TEE No SOE, neg bubble  TG 823, direct LDL 151   HgbA1c - 5.6  UDS - negative  Hypercoagulable labs neg thus far only ACL IgM 16 slightly elevated  VTE prophylaxis - Lovenox 40 mg sq daily   aspirin 81 mg daily prior to admission, now on aspirin 81 and the Plavix 75 DAPT for 3 weeks and then Plavix alone.  Therapy recommendations:  CIR - awaiting bed  Disposition:  Pending - medically ready for d/c, pending CIR bed availability  Seizure  Witnessed at stroke onset  Seizure in CT  Treated w/ ativan  Loaded w/ Keppra 1500  Now on Keppra 500 q 12h  EEG - 10/19 - Continuous slow, generalized suggestive of severe diffuse encephalopathy, nonspecific etiology. No seizures or epileptiform discharges were seen throughout the recording.  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  Since then, he has quit all of them.  Acute Respiratory Failure,  resolved  Secondary to stroke  Intubated for IR, left intubated post IR per anesthesia  Extubated 10/19 w/o difficulty  Leukocytosis  WBC 13.6->13.5  Low grade temp 100.4  CXR BLL atx / infiltrate   Repeat CXR and labs in am    Hyperlipidemia  Home meds:  No statin  TG 823->479, direct LDL 151, goal < 70  On lipitor 80    Continue statin at discharge  Dysphagia   . Secondary to stroke .  Has a swallow after modified barium study . Dys 1 with nectar thick liquid . Speech on board . On IVF @ 50   Other Stroke Risk Factors  Obesity, Body mass index is 34.17 kg/m., recommend weight loss, diet and exercise as appropriate   Other Active Problems  Leukocytosis - WBC 16.1->9.2->9.3->11.2->13.6 (T Max 99.2) If fever develops will check U/A and CXR. Recheck CBC Monday.  Awaiting CIR bed - per admissions coordinator - no beds currently available. She will F/U on Monday  Hospital day # 7 Mobilize out of bed. If temperature spike may need to culture and start antibiotics for aspiration pneumonia. Obtain chest x-ray PA and lateral views. Await rehabilitation bed..I have spent a total of  25 minutes with the patient reviewing hospital notes,  test results, labs and examining the patient as well as establishing an assessment and plan that was discussed personally with the patient.  > 50% of time was spent in direct patient care.  Delia Heady, MD    To contact Stroke Continuity provider, please refer to [email or web address]. After hours, contact General Neurology

## 2020-07-10 NOTE — Progress Notes (Signed)
  Speech Language Pathology Treatment: Dysphagia;Cognitive-Linquistic  Patient Details Name: Mark Oneal MRN: [id number] DOB: [birth year] Today's Date: 07/10/2020 Time: 5053-9767 SLP Time Calculation (min) (ACUTE ONLY): 35 min  Assessment / Plan / Recommendation Clinical Impression  Pt was seen for treatment. [employer] Spanish interpreter, Mark Oneal, ([place] #[id number]) was used for translation and interpretation. However, pt often responded to questions in English prior to translation. He tolerated dysphagia 3 solids with improved mastication. Pocketing was noted on the left but was improved with prompted lingual sweeps. Pt inconsistently demonstrated the chin tuck posture appropriately with verbal prompts. No s/sx of aspiration were noted, but pt does have a history of silent aspiration. Pt continues to deny any deficits stating that his cognition and swallowing are "normal". [facility] Mental Status Examination was completed to re-evaluate the pt's cognitive-linguistic skills. He achieved a score of 15/30 which is below the normal limits of 27 or more out of 30. He exhibited difficulty in the areas of awareness,  memory, mental flexibility, and executive function. It is recommended that his diet be advanced to dysphagia 3 solids and that nectar thick liquids be continued due to pt's inconsistency with demonstration of chin tuck posture. SLP will continue to follow pt.    HPI HPI: Mark Oneal is a 34 y.o. 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 Pt found to have occluded R ICA, MCA, and ACA requiring thrombectomy by IR. Pt also received tPA. ETT 10/18-10/19.      SLP Plan  Continue with current plan of care       Recommendations  Diet recommendations: Dysphagia 3 (mechanical soft);Nectar-thick liquid Liquids provided via:  Cup;Straw Medication Administration: Crushed with puree Supervision: Staff to assist with self feeding Compensations: Lingual sweep for clearance of pocketing;Monitor for anterior loss Postural Changes and/or Swallow Maneuvers: Seated upright 90 degrees                Oral Care Recommendations: Oral care BID Follow up Recommendations: Inpatient Rehab SLP Visit Diagnosis: Dysphagia, unspecified (R13.10) Plan: Continue with current plan of care       Mark Oneal I. Vear Clock, MS, CCC-SLP Acute Rehabilitation Services Office number [phone] Pager [phone]                Mark Oneal 07/10/2020, 4:58 PM

## 2020-07-10 NOTE — Progress Notes (Signed)
Occupational Therapy Treatment Patient Details Name: Mark Oneal MRN: [id number] DOB: 1985-10-21 Today's Date: 07/10/2020    History of present illness Mark Oneal is a 34 y.o. 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 Pt found to have occluded R ICA, MCA, and ACA requiring thrombectomy by IR. Pt also received tPA.   OT comments  Pt progressing towards OT goals. Pt continues to c/o back pain but is agreeable to therapy session and participating in seated therapeutic activity EOB. Focus on LUE AAROM and using LUE as gross stabilizer during functional tasks. Pt with improvements in LUE movement but noted attempts to use compensatory shoulder movements with LUE tasks. Pt remains impulsive and with decreased insight into current deficits requiring assist throughout. Feel he remains excellent candidate for CIR level therapies at time of discharge. Will continue to follow.    Follow Up Recommendations  CIR;Supervision/Assistance - 24 hour    Equipment Recommendations  Other (comment)          Precautions / Restrictions Precautions Precautions: Fall Precaution Comments: L inattention Restrictions Weight Bearing Restrictions: No       Mobility Bed Mobility Overal bed mobility: Needs Assistance Bed Mobility: Supine to Sit;Sit to Supine Rolling:  (cues required for sequencing)   Supine to sit: Min guard Sit to supine: Min guard   General bed mobility comments: pt inistent to perform on his own, attempting to provide cues for log roll technique to aide in reducing back pain with transitions; increased time/cues to process and initiate returning to supine  Transfers Overall transfer level: Needs assistance   Transfers: Lateral/Scoot Transfers          Lateral/Scoot Transfers: Min guard General transfer comment: for scooting  towards HOB    Balance Overall balance assessment: Needs assistance Sitting-balance support: Single extremity supported;Feet supported Sitting balance-Leahy Scale: Fair Sitting balance - Comments: pt tending to rely on RUE support                                    ADL either performed or assessed with clinical judgement   ADL Overall ADL's : Needs assistance/impaired                                       General ADL Comments: focus on therapeutic activity seated EOB incorporating LUE                       Cognition Arousal/Alertness: Awake/alert Behavior During Therapy: Flat affect;WFL for tasks assessed/performed Overall Cognitive Status: Impaired/Different from baseline Area of Impairment: Attention;Memory;Following commands;Safety/judgement;Awareness;Problem solving                   Current Attention Level: Sustained Memory: Decreased recall of precautions Following Commands: Follows one step commands with increased time;Follows one step commands inconsistently Safety/Judgement: Decreased awareness of deficits;Decreased awareness of safety Awareness: Emergent Problem Solving: Slow processing;Decreased initiation;Difficulty sequencing General Comments: continues to require cues for safety and command follow; poor insight into current deficits and noted with delay in initiating certain mobility tasks         Exercises Exercises: General Upper Extremity;Other exercises General Exercises - Upper Extremity Shoulder Flexion:  AAROM;Left;10 reps;Seated Shoulder Horizontal ABduction: AAROM;Left;10 reps Shoulder Horizontal ADduction: AAROM;Left;10 reps Elbow Flexion: AAROM;Left;10 reps Elbow Extension: AAROM;Left;10 reps Digit Composite Flexion: AROM;Left;5 reps Composite Extension: AROM;Left;5 reps Other Exercises Other Exercises: towel glides using LUE x15 Other Exercises: using LUE as grosp grasp while opening container with  RUE   Shoulder Instructions       General Comments video interpreter used, Elita Quick [phone]    Pertinent Vitals/ Pain       Pain Assessment: Faces Faces Pain Scale: Hurts even more Pain Location: back, R flank Pain Descriptors / Indicators: Guarding;Grimacing Pain Intervention(s): Limited activity within patient's tolerance;Monitored during session;Repositioned  Home Living                                          Prior Functioning/Environment              Frequency  Min 3X/week        Progress Toward Goals  OT Goals(current goals can now be found in the care plan section)  Progress towards OT goals: Progressing toward goals  Acute Rehab OT Goals Patient Stated Goal: get better OT Goal Formulation: With patient Time For Goal Achievement: 07/18/20 Potential to Achieve Goals: Good ADL Goals Pt Will Perform Grooming: with supervision;sitting Pt Will Perform Lower Body Bathing: with min assist;sitting/lateral leans;sit to/from stand Pt Will Perform Upper Body Dressing: with min assist;sitting Pt Will Perform Lower Body Dressing: with min assist;sit to/from stand;sitting/lateral leans Pt Will Transfer to Toilet: with min assist;stand pivot transfer Pt Will Perform Toileting - Clothing Manipulation and hygiene: with min assist;sit to/from stand;sitting/lateral leans Pt/caregiver will Perform Home Exercise Program: Increased strength;Increased ROM;Left upper extremity;With minimal assist;With written HEP provided Additional ADL Goal #1: Pt will locate and identify ADL items in L visual field with no more than min cues. Additional ADL Goal #2: Pt will maintain sitting balance EOB >5 min with minguard assist as precursor to EOB/OOB ADL.  Plan Discharge plan remains appropriate    Co-evaluation                 AM-PAC OT "6 Clicks" Daily Activity     Outcome Measure   Help from another person eating meals?: A Little Help from another person taking  care of personal grooming?: A Little Help from another person toileting, which includes using toliet, bedpan, or urinal?: A Lot Help from another person bathing (including washing, rinsing, drying)?: A Lot Help from another person to put on and taking off regular upper body clothing?: A Lot Help from another person to put on and taking off regular lower body clothing?: A Lot 6 Click Score: 14    End of Session    OT Visit Diagnosis: Hemiplegia and hemiparesis;Other symptoms and signs involving cognitive function;Other abnormalities of gait and mobility (R26.89) Hemiplegia - Right/Left: Left Hemiplegia - dominant/non-dominant: Non-Dominant Hemiplegia - caused by: Cerebral infarction   Activity Tolerance Patient tolerated treatment well   Patient Left in bed;with call bell/phone within reach;with bed alarm set   Nurse Communication Mobility status        Time: 7262-0355 OT Time Calculation (min): 23 min  Charges: OT General Charges $OT Visit: 1 Visit OT Treatments $Therapeutic Activity: 23-37 mins  Marcy Siren, OT Acute Rehabilitation Services Pager [phone] Office [phone]   Orlando Penner 07/10/2020, 5:48 PM

## 2020-07-11 ENCOUNTER — Inpatient Hospital Stay (HOSPITAL_COMMUNITY): Payer: Medicaid Other

## 2020-07-11 LAB — CBC
HCT: 33.2 % — ABNORMAL LOW (ref 39.0–52.0)
Hemoglobin: 11.6 g/dL — ABNORMAL LOW (ref 13.0–17.0)
MCH: 31.7 pg (ref 26.0–34.0)
MCHC: 34.9 g/dL (ref 30.0–36.0)
MCV: 90.7 fL (ref 80.0–100.0)
Platelets: 259 10*3/uL (ref 150–400)
RBC: 3.66 MIL/uL — ABNORMAL LOW (ref 4.22–5.81)
RDW: 11.2 % — ABNORMAL LOW (ref 11.5–15.5)
WBC: 12.3 10*3/uL — ABNORMAL HIGH (ref 4.0–10.5)
nRBC: 0 % (ref 0.0–0.2)

## 2020-07-11 LAB — BASIC METABOLIC PANEL
Anion gap: 9 (ref 5–15)
BUN: 16 mg/dL (ref 6–20)
CO2: 26 mmol/L (ref 22–32)
Calcium: 9 mg/dL (ref 8.9–10.3)
Chloride: 100 mmol/L (ref 98–111)
Creatinine, Ser: 0.92 mg/dL (ref 0.61–1.24)
GFR, Estimated: >60 mL/min (ref >60)
Glucose, Bld: 111 mg/dL — ABNORMAL HIGH (ref 70–99)
Potassium: 3.8 mmol/L (ref 3.5–5.1)
Sodium: 135 mmol/L (ref 135–145)

## 2020-07-11 MED ORDER — CEFDINIR 300 MG PO CAPS
300.0000 mg | ORAL_CAPSULE | Freq: Two times a day (BID) | ORAL | Status: DC
  Administered 2020-07-11 – 2020-07-12 (×3): 300 mg via ORAL
  Filled 2020-07-11 (×4): qty 1

## 2020-07-11 NOTE — Progress Notes (Signed)
Chest xray dine in xray dept

## 2020-07-11 NOTE — Progress Notes (Signed)
Inpatient Rehabilitation Admissions Coordinator  I met with Patient at bedside as he spoke with his wife by phone. I am hopeful for Cir bed available tomorrow. He and his wife are in agreement to admit. I will follow up in the morning.  Danne Baxter, RN, MSN Rehab Admissions Coordinator [phone] 07/11/2020 5:05 PM

## 2020-07-11 NOTE — Plan of Care (Signed)

## 2020-07-11 NOTE — Progress Notes (Signed)
  Speech Language Pathology Treatment: Dysphagia  Patient Details Name: Mark Oneal MRN: [id number] DOB: 10/10/1985 Today's Date: 07/11/2020 Time: 1200-1210 SLP Time Calculation (min) (ACUTE ONLY): 10 min  Assessment / Plan / Recommendation Clinical Impression  Pt was seen with his lunch meal, saying that he liked this diet better but also not eating much despite encouragement. Note that upon SLP arrival he was sitting partially reclined in bed with his tray. SLP provided repositioning as well as Mod cues for clearance of L buccal pocketing throughout session. Occasional delayed throat clearing was noted. Given results of current CXR advanced trials were deferred, although pt is also likely at risk for aspiration if meals are unsupervised as he is not yet independent at using precautions and strategies. Current diet of Dys 3 solids and nectar thick liquids should still be appropriate but continue to recommend full supervision.   HPI HPI: Mark Oneal is a 34 y.o. 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 Pt found to have occluded R ICA, MCA, and ACA requiring thrombectomy by IR. Pt also received tPA. ETT 10/18-10/19.      SLP Plan  Continue with current plan of care       Recommendations  Diet recommendations: Dysphagia 3 (mechanical soft);Nectar-thick liquid Liquids provided via: Cup;Straw Medication Administration: Crushed with puree Supervision: Staff to assist with self feeding Compensations: Lingual sweep for clearance of pocketing;Monitor for anterior loss Postural Changes and/or Swallow Maneuvers: Seated upright 90 degrees                Oral Care Recommendations: Oral care BID Follow up Recommendations: Inpatient Rehab SLP Visit Diagnosis: Dysphagia, unspecified (R13.10) Plan: Continue with current plan of care        GO                Mahala Menghini., M.A. CCC-SLP Acute Rehabilitation Services Pager [phone] Office [phone]  07/11/2020, 1:00 PM

## 2020-07-11 NOTE — Progress Notes (Signed)
STROKE TEAM PROGRESS NOTE   INTERVAL HISTORY Patient is sitting up comfortably in bed.  He states he is feeling better.  His back pain is improved.  He has no complaints today.  Still has low-grade fever and chest x-ray AP and lateral view showed left lower lobe infiltrate.  White count remains elevated at 12.3 and BMP is normal Neurological exam is unchanged Vitals:   07/11/20 0000 07/11/20 0422 07/11/20 0752 07/11/20 1122  BP: 122/78 125/83 122/79 119/74  Pulse: 88 97 95 92  Resp: (!) 22 18 16  (!) 24  Temp: 98.4 F (36.9 C) 100.2 F (37.9 C) 98.2 F (36.8 C) 98.7 F (37.1 C)  TempSrc: Oral Oral Oral Oral  SpO2: 97% 94% 98% 97%  Weight:      Height:       CBC:  Recent Labs  Lab 07/10/20 0408 07/11/20 0457  WBC 13.5* 12.3*  HGB 13.1 11.6*  HCT 37.0* 33.2*  MCV 90.2 90.7  PLT 236 259   Basic Metabolic Panel:  Recent Labs  Lab 07/08/20 0224 07/11/20 0457  NA 137 135  K 3.7 3.8  CL 101 100  CO2 24 26  GLUCOSE 119* 111*  BUN 22* 16  CREATININE 1.02 0.92  CALCIUM 9.9 9.0    IMAGING past 24 hours DG Chest 2 View  Result Date: 07/11/2020 CLINICAL DATA:  Recent infiltrate left base. EXAM: CHEST - 2 VIEW COMPARISON:  July 10, 2020 FINDINGS: Ill-defined airspace opacity remains in the left lower lung region. The lungs elsewhere are clear. Heart is upper normal in size with pulmonary vascularity normal. No adenopathy. No bone lesions. IMPRESSION: Ill-defined opacity remains in left lower lobe concerning for pneumonia in this area. Lungs elsewhere clear. Heart upper normal in size. No evident adenopathy. Electronically Signed   By: July 12, 2020 III M.D.   On: 07/11/2020 08:19   PHYSICAL EXAM   Temp:  [98.2 F (36.8 C)-100.2 F (37.9 C)] 98.7 F (37.1 C) (10/26 1122) Pulse Rate:  [88-97] 92 (10/26 1122) Resp:  [16-24] 24 (10/26 1122) BP: (119-125)/(74-83) 119/74 (10/26 1122) SpO2:  [94 %-98 %] 97 % (10/26 1122)  General -mildly obese young African-American  male, not in acute distress.  Ophthalmologic - fundi not visualized due to noncooperation.  Cardiovascular - Regular rhythm and rate.  Neuro stable - awake alert eyes open. Oriented to place, time and age. Paucity of speech, able to name 3/3 and  Mild dysarthria. Following simple commands. Visual field full, able to have bilateral gaze. Left facial droop, tongue protrusion to the left. Left UE proximal 3/5, 3+/5 bicep, 2/5 tricep, 2/5 finger movement. LLE 4+/5 proximal and distal. RUE and RLE 5/5. Sensation subjectively symmetrical. RUE FTN intact. Gait not tested.    ASSESSMENT/PLAN Mark Oneal is a 34 y.o. 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 Found to have L sided weakness w/ neglect and R gaze on arrival to ED. CTH w/ hyperdense R MCA w/ w/ cervical R ICA occlusion and ASPECTS 8. Received IV tPA 07/03/2020 at 1356 and sent to IR.  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 Suspect R MCA thrombus w/ acute infarct. ASPECTS 8.    CTA  head & neck cervical R ICA occlusion 2cm above origin w/ reconstitution and subsequent occlusion proximal R M1 extending into bifurcation   Cerebral angio / IR extracranial R ICA/terminus, R MCA occlusion w/ TICI3 revascularization. Possible R ICA dissection  Repeat CT head dense contrast staining posterior R lentiform (no hemorrhage). New/increased edema R basal ganglia, R insula, anterior temporal lobe w/ mild mass effect R lateral ventricle. 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 No seizures or epileptiform  discharges were seen throughout the recording.  2D Echo EF 60-65%  TEE No SOE, neg bubble  TG 823, direct LDL 151   HgbA1c - 5.6  UDS - negative  Hypercoagulable labs neg thus far only ACL IgM 16 slightly elevated  VTE prophylaxis - Lovenox 40 mg sq daily   aspirin 81 mg daily prior to admission, now on aspirin 81 and the Plavix 75 DAPT for 3 weeks and then Plavix alone.  Therapy recommendations:  CIR - awaiting bed  Disposition:  Pending - medically ready for d/c, pending CIR bed availability  Seizure  Witnessed at stroke onset  Seizure in CT  Treated w/ ativan  Loaded w/ Keppra 1500  Now on Keppra 500 q 12h  EEG - 10/19 - Continuous slow, generalized suggestive of severe diffuse encephalopathy, nonspecific etiology. No seizures or epileptiform discharges were seen throughout the recording.  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  Since then, he has quit all of them.  Acute Respiratory Failure, resolved  Secondary to stroke  Intubated for IR, left intubated post IR per anesthesia  Extubated 10/19 w/o difficulty  Likely Aspiration PNA Leukocytosis  WBC 13.6->13.5->12.3  Low grade temp 100.4  CXR BLL atx / infiltrate   Repeat CXR LLL PNA   UA neg  Discussed w/ pharmacy  cefdimir 300 bid 10/26>>  Hyperlipidemia  Home meds:  No statin  TG 823->479, direct LDL 151, goal < 70  On lipitor 80    Continue statin at discharge  Dysphagia   . Secondary to stroke . Has a swallow after modified barium study . Dys 1 with nectar thick liquid . Speech on board . On IVF @ 50   Other Stroke Risk Factors  Obesity, Body mass index is 34.17 kg/m., recommend weight loss, diet and exercise as appropriate   Other Active Problems    Hospital day # 8 Patient is neurologically stable  but continues to have low-grade fever with elevated white count and abnormal chest x-ray with infiltrate hence will start on Omnicef 300 every 12 hourly treatment for pneumonia.  Patient is otherwise medically stable to be transferred to inpatient rehab when bed is available after insurance approval.  Long discussion patient and answered questions.  Greater than 50% time during this 25-minute visit was spent in counseling and coordination of care about his stroke and pneumonia and answering questions   Delia Heady, MD To contact Stroke Continuity provider, please refer to [email or web address]. After hours, contact General Neurology

## 2020-07-11 NOTE — Progress Notes (Signed)
Physical Therapy Treatment Patient Details Name: Mark Oneal MRN: [id number] DOB: Mar 10, 1986 Today's Date: 07/11/2020    History of Present Illness Mark Oneal is a 34 y.o. 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 Pt found to have occluded R ICA, MCA, and ACA requiring thrombectomy by IR. Pt also received tPA.    PT Comments    Patient progressing well towards PT goals. Continues to be impulsive with mobility putting pt at increased risk for falls. Improved ambulation distance with min A for balance/safety with left inattention noted. Left hand keeps falling off RW without awareness. With increased distance, pt becomes more fatigued and requires more assist for safety with balance. Performed 5xSTS in 20.13 seconds (norm for his age is 6.1 sec +/- 1.4 sec) indicating decreased functional strength, impaired balance and fall risk. Eager to get to rehab. Encouraged using LUE functionally as much as possible. Will follow.   Follow Up Recommendations  CIR     Equipment Recommendations  Other (comment) (TBA)    Recommendations for Other Services       Precautions / Restrictions Precautions Precautions: Fall Precaution Comments: Lft inattention Restrictions Weight Bearing Restrictions: No    Mobility  Bed Mobility Overal bed mobility: Needs Assistance Bed Mobility: Supine to Sit;Sit to Supine     Supine to sit: Min guard;HOB elevated Sit to supine: Min guard;HOB elevated   General bed mobility comments: Use of rail and momentum to get to EOB.  Transfers Overall transfer level: Needs assistance Equipment used: Rolling walker (2 wheeled);None Transfers: Sit to/from Stand Sit to Stand: Min guard;Min assist         General transfer comment: Min guard- Min assist to stand from EOB x7, uncontrolled descent into  chair  Ambulation/Gait Ambulation/Gait assistance: Min assist Gait Distance (Feet): 150 Feet Assistive device: Rolling walker (2 wheeled) Gait Pattern/deviations: Step-through pattern;Decreased stride length;Decreased dorsiflexion - left Gait velocity: varying speeds, unsafe at times Gait velocity interpretation: <1.8 ft/sec, indicate of risk for recurrent falls General Gait Details: Unsteady gait with left hand falling off RW without awareness; needing verbal/tactile cues to correct and for direction. Impulsive and not following commands well to stop and correct, Min A for safety. more assist needed when fatigued.   Stairs             Wheelchair Mobility    Modified Rankin (Stroke Patients Only) Modified Rankin (Stroke Patients Only) Pre-Morbid Rankin Score: No significant disability Modified Rankin: Moderately severe disability     Balance Overall balance assessment: Needs assistance Sitting-balance support: Feet supported;Single extremity supported Sitting balance-Leahy Scale: Fair Sitting balance - Comments: pt tending to rely on RUE support but able to sit without support.   Standing balance support: During functional activity Standing balance-Leahy Scale: Poor Standing balance comment: min A for standing balance.                            Cognition Arousal/Alertness: Awake/alert Behavior During Therapy: Flat affect Overall Cognitive Status: Impaired/Different from baseline Area of Impairment: Attention;Memory;Following commands;Safety/judgement;Awareness;Problem solving                   Current Attention Level: Sustained   Following Commands: Follows one step commands with increased time;Follows one step commands inconsistently Safety/Judgement: Decreased awareness of deficits;Decreased awareness of safety Awareness:  Emergent Problem Solving: Slow processing;Decreased initiation;Difficulty sequencing General Comments: continues to require  cues for safety and command following. repetition needed at times; left inattention. Poor insight into current deficits. Impulsive.      Exercises      General Comments General comments (skin integrity, edema, etc.): 5xSTS stood in 20.13 seconds (norm for his age is 6.1 sec +/- 1.4 sec) indicating decreased functional strength, impaired balance and fall risk.      Pertinent Vitals/Pain Pain Assessment: Faces Faces Pain Scale: No hurt    Home Living                      Prior Function            PT Goals (current goals can now be found in the care plan section) Progress towards PT goals: Progressing toward goals    Frequency    Min 4X/week      PT Plan Current plan remains appropriate    Co-evaluation              AM-PAC PT "6 Clicks" Mobility   Outcome Measure  Help needed turning from your back to your side while in a flat bed without using bedrails?: None Help needed moving from lying on your back to sitting on the side of a flat bed without using bedrails?: A Little Help needed moving to and from a bed to a chair (including a wheelchair)?: A Little Help needed standing up from a chair using your arms (e.g., wheelchair or bedside chair)?: A Little Help needed to walk in hospital room?: A Little Help needed climbing 3-5 steps with a railing? : Total 6 Click Score: 17    End of Session Equipment Utilized During Treatment: Gait belt Activity Tolerance: Patient tolerated treatment well Patient left: in bed;with call bell/phone within reach;with bed alarm set Nurse Communication: Mobility status PT Visit Diagnosis: Unsteadiness on feet (R26.81);Muscle weakness (generalized) (M62.81);Difficulty in walking, not elsewhere classified (R26.2)     Time: 8889-1694 PT Time Calculation (min) (ACUTE ONLY): 17 min  Charges:  $Gait Training: 8-22 mins                     [name], PT, DPT Acute Rehabilitation Services Pager [phone] Office  [phone]       [name] 07/11/2020, 2:05 PM

## 2020-07-12 ENCOUNTER — Other Ambulatory Visit: Payer: Self-pay

## 2020-07-12 ENCOUNTER — Inpatient Hospital Stay (HOSPITAL_COMMUNITY)
Admission: RE | Admit: 2020-07-12 | Discharge: 2020-07-22 | DRG: 056 | Disposition: A | Discharge status: Home / Self Care | Payer: Self-pay | Source: Intra-hospital | Attending: Physical Medicine & Rehabilitation | Admitting: Physical Medicine & Rehabilitation

## 2020-07-12 ENCOUNTER — Encounter (HOSPITAL_COMMUNITY): Payer: Self-pay | Admitting: Physical Medicine & Rehabilitation

## 2020-07-12 DIAGNOSIS — I69391 Dysphagia following cerebral infarction: Secondary | ICD-10-CM

## 2020-07-12 DIAGNOSIS — R414 Neurologic neglect syndrome: Secondary | ICD-10-CM | POA: Diagnosis present

## 2020-07-12 DIAGNOSIS — E669 Obesity, unspecified: Secondary | ICD-10-CM | POA: Diagnosis present

## 2020-07-12 DIAGNOSIS — D62 Acute posthemorrhagic anemia: Secondary | ICD-10-CM | POA: Diagnosis present

## 2020-07-12 DIAGNOSIS — I6521 Occlusion and stenosis of right carotid artery: Secondary | ICD-10-CM | POA: Diagnosis present

## 2020-07-12 DIAGNOSIS — R7989 Other specified abnormal findings of blood chemistry: Secondary | ICD-10-CM | POA: Diagnosis not present

## 2020-07-12 DIAGNOSIS — Z6834 Body mass index (BMI) 34.0-34.9, adult: Secondary | ICD-10-CM

## 2020-07-12 DIAGNOSIS — I69322 Dysarthria following cerebral infarction: Secondary | ICD-10-CM

## 2020-07-12 DIAGNOSIS — Z87891 Personal history of nicotine dependence: Secondary | ICD-10-CM

## 2020-07-12 DIAGNOSIS — I63511 Cerebral infarction due to unspecified occlusion or stenosis of right middle cerebral artery: Principal | ICD-10-CM | POA: Diagnosis present

## 2020-07-12 DIAGNOSIS — E6609 Other obesity due to excess calories: Secondary | ICD-10-CM

## 2020-07-12 DIAGNOSIS — I63411 Cerebral infarction due to embolism of right middle cerebral artery: Secondary | ICD-10-CM

## 2020-07-12 DIAGNOSIS — J690 Pneumonitis due to inhalation of food and vomit: Secondary | ICD-10-CM

## 2020-07-12 DIAGNOSIS — Z6831 Body mass index (BMI) 31.0-31.9, adult: Secondary | ICD-10-CM

## 2020-07-12 DIAGNOSIS — R7401 Elevation of levels of liver transaminase levels: Secondary | ICD-10-CM | POA: Diagnosis not present

## 2020-07-12 DIAGNOSIS — Z87898 Personal history of other specified conditions: Secondary | ICD-10-CM

## 2020-07-12 DIAGNOSIS — I69354 Hemiplegia and hemiparesis following cerebral infarction affecting left non-dominant side: Principal | ICD-10-CM

## 2020-07-12 DIAGNOSIS — I63512 Cerebral infarction due to unspecified occlusion or stenosis of left middle cerebral artery: Principal | ICD-10-CM | POA: Diagnosis present

## 2020-07-12 DIAGNOSIS — I69319 Unspecified symptoms and signs involving cognitive functions following cerebral infarction: Secondary | ICD-10-CM

## 2020-07-12 DIAGNOSIS — E785 Hyperlipidemia, unspecified: Secondary | ICD-10-CM | POA: Diagnosis present

## 2020-07-12 DIAGNOSIS — I959 Hypotension, unspecified: Secondary | ICD-10-CM | POA: Diagnosis not present

## 2020-07-12 DIAGNOSIS — Z7982 Long term (current) use of aspirin: Secondary | ICD-10-CM

## 2020-07-12 DIAGNOSIS — R1312 Dysphagia, oropharyngeal phase: Secondary | ICD-10-CM | POA: Diagnosis present

## 2020-07-12 DIAGNOSIS — R569 Unspecified convulsions: Secondary | ICD-10-CM | POA: Diagnosis present

## 2020-07-12 DIAGNOSIS — Z8249 Family history of ischemic heart disease and other diseases of the circulatory system: Secondary | ICD-10-CM

## 2020-07-12 MED ORDER — LEVETIRACETAM 500 MG PO TABS
500.0000 mg | ORAL_TABLET | Freq: Two times a day (BID) | ORAL | Status: DC
  Administered 2020-07-12 – 2020-07-22 (×20): 500 mg via ORAL
  Filled 2020-07-12 (×20): qty 1

## 2020-07-12 MED ORDER — ACETAMINOPHEN 325 MG PO TABS
325.0000 mg | ORAL_TABLET | ORAL | Status: DC | PRN
  Administered 2020-07-21: 325 mg via ORAL
  Filled 2020-07-12: qty 1

## 2020-07-12 MED ORDER — ASPIRIN 81 MG PO CHEW: 81.0000 mg | CHEWABLE_TABLET | Freq: Every day | ORAL | Status: DC

## 2020-07-12 MED ORDER — CLOPIDOGREL BISULFATE 75 MG PO TABS
75.0000 mg | ORAL_TABLET | Freq: Every day | ORAL | Status: DC
  Administered 2020-07-13 – 2020-07-22 (×10): 75 mg via ORAL
  Filled 2020-07-12 (×10): qty 1

## 2020-07-12 MED ORDER — FLEET ENEMA 7-19 GM/118ML RE ENEM: 1.0000 | ENEMA | Freq: Once | RECTAL | Status: DC | PRN

## 2020-07-12 MED ORDER — PROCHLORPERAZINE EDISYLATE 10 MG/2ML IJ SOLN: 5.0000 mg | Freq: Four times a day (QID) | INTRAMUSCULAR | Status: DC | PRN

## 2020-07-12 MED ORDER — PROCHLORPERAZINE MALEATE 5 MG PO TABS: 5.0000 mg | ORAL_TABLET | Freq: Four times a day (QID) | ORAL | Status: DC | PRN

## 2020-07-12 MED ORDER — TRAZODONE HCL 50 MG PO TABS: 25.0000 mg | ORAL_TABLET | Freq: Every evening | ORAL | Status: DC | PRN

## 2020-07-12 MED ORDER — CHLORHEXIDINE GLUCONATE 0.12% ORAL RINSE (MEDLINE KIT)
15.0000 mL | Freq: Two times a day (BID) | OROMUCOSAL | Status: DC
  Administered 2020-07-13 – 2020-07-19 (×5): 15 mL via OROMUCOSAL

## 2020-07-12 MED ORDER — ENOXAPARIN SODIUM 40 MG/0.4ML ~~LOC~~ SOLN: 40.0000 mg | Freq: Every day | SUBCUTANEOUS | Status: DC

## 2020-07-12 MED ORDER — ATORVASTATIN CALCIUM 80 MG PO TABS
80.0000 mg | ORAL_TABLET | Freq: Every day | ORAL | Status: DC
  Administered 2020-07-13 – 2020-07-21 (×9): 80 mg via ORAL
  Filled 2020-07-12 (×9): qty 1

## 2020-07-12 MED ORDER — RESOURCE THICKENUP CLEAR PO POWD: 1.0000 | ORAL | Status: DC | PRN

## 2020-07-12 MED ORDER — ENOXAPARIN SODIUM 40 MG/0.4ML ~~LOC~~ SOLN
40.0000 mg | Freq: Every day | SUBCUTANEOUS | Status: DC
  Administered 2020-07-13 – 2020-07-22 (×10): 40 mg via SUBCUTANEOUS
  Filled 2020-07-12 (×10): qty 0.4

## 2020-07-12 MED ORDER — ORAL CARE MOUTH RINSE
15.0000 mL | Freq: Two times a day (BID) | OROMUCOSAL | Status: DC
  Administered 2020-07-13 – 2020-07-21 (×17): 15 mL via OROMUCOSAL

## 2020-07-12 MED ORDER — ATORVASTATIN CALCIUM 80 MG PO TABS: 80.0000 mg | ORAL_TABLET | Freq: Every day | ORAL | Status: DC

## 2020-07-12 MED ORDER — POLYETHYLENE GLYCOL 3350 17 G PO PACK: 17.0000 g | PACK | Freq: Every day | ORAL | 0 refills | Status: DC

## 2020-07-12 MED ORDER — DICLOFENAC EPOLAMINE 1.3 % EX PTCH: 1.0000 | MEDICATED_PATCH | Freq: Two times a day (BID) | CUTANEOUS | Status: DC

## 2020-07-12 MED ORDER — DIPHENHYDRAMINE HCL 12.5 MG/5ML PO ELIX: 12.5000 mg | ORAL_SOLUTION | Freq: Four times a day (QID) | ORAL | Status: DC | PRN

## 2020-07-12 MED ORDER — ALUM & MAG HYDROXIDE-SIMETH 200-200-20 MG/5ML PO SUSP: 30.0000 mL | ORAL | Status: DC | PRN

## 2020-07-12 MED ORDER — SENNOSIDES-DOCUSATE SODIUM 8.6-50 MG PO TABS
2.0000 | ORAL_TABLET | Freq: Two times a day (BID) | ORAL | Status: DC
  Administered 2020-07-13 – 2020-07-22 (×18): 2 via ORAL
  Filled 2020-07-12 (×18): qty 2

## 2020-07-12 MED ORDER — LEVETIRACETAM 500 MG PO TABS: 500.0000 mg | ORAL_TABLET | Freq: Two times a day (BID) | ORAL | Status: DC

## 2020-07-12 MED ORDER — SENNOSIDES-DOCUSATE SODIUM 8.6-50 MG PO TABS: 1.0000 | ORAL_TABLET | Freq: Two times a day (BID) | ORAL | Status: DC

## 2020-07-12 MED ORDER — PANTOPRAZOLE SODIUM 40 MG IV SOLR: 40.0000 mg | Freq: Every day | INTRAVENOUS | Status: DC

## 2020-07-12 MED ORDER — SODIUM CHLORIDE 0.9 % IV SOLN: 50.0000 mL | INTRAVENOUS | 0 refills | Status: DC

## 2020-07-12 MED ORDER — RESOURCE THICKENUP CLEAR PO POWD
ORAL | Status: DC | PRN
  Filled 2020-07-12: qty 125

## 2020-07-12 MED ORDER — ACETAMINOPHEN 325 MG PO TABS: 650.0000 mg | ORAL_TABLET | ORAL | Status: DC | PRN

## 2020-07-12 MED ORDER — CHLORHEXIDINE GLUCONATE CLOTH 2 % EX PADS
6.0000 | MEDICATED_PAD | Freq: Every day | CUTANEOUS | Status: DC
  Administered 2020-07-13 – 2020-07-14 (×2): 6 via TOPICAL

## 2020-07-12 MED ORDER — CEFDINIR 300 MG PO CAPS: 300.0000 mg | ORAL_CAPSULE | Freq: Two times a day (BID) | ORAL | Status: DC

## 2020-07-12 MED ORDER — DOCUSATE SODIUM 50 MG/5ML PO LIQD
100.0000 mg | Freq: Two times a day (BID) | ORAL | Status: DC
  Administered 2020-07-12 – 2020-07-19 (×9): 100 mg via ORAL
  Filled 2020-07-12 (×13): qty 10

## 2020-07-12 MED ORDER — LIDOCAINE 5 % EX PTCH: 1.0000 | MEDICATED_PATCH | Freq: Every day | CUTANEOUS | 0 refills | Status: DC

## 2020-07-12 MED ORDER — BISACODYL 10 MG RE SUPP: 10.0000 mg | Freq: Every day | RECTAL | Status: DC | PRN

## 2020-07-12 MED ORDER — PROCHLORPERAZINE 25 MG RE SUPP: 12.5000 mg | Freq: Four times a day (QID) | RECTAL | Status: DC | PRN

## 2020-07-12 MED ORDER — DICLOFENAC EPOLAMINE 1.3 % EX PTCH
1.0000 | MEDICATED_PATCH | Freq: Two times a day (BID) | CUTANEOUS | Status: DC
  Administered 2020-07-17: 1 via TRANSDERMAL
  Filled 2020-07-12 (×18): qty 1

## 2020-07-12 MED ORDER — LIDOCAINE 5 % EX PTCH
1.0000 | MEDICATED_PATCH | Freq: Every day | CUTANEOUS | Status: DC
  Administered 2020-07-14 – 2020-07-17 (×2): 1 via TRANSDERMAL
  Filled 2020-07-12 (×4): qty 1

## 2020-07-12 MED ORDER — PANTOPRAZOLE SODIUM 40 MG PO TBEC
40.0000 mg | DELAYED_RELEASE_TABLET | Freq: Every day | ORAL | Status: DC
  Administered 2020-07-13 – 2020-07-21 (×9): 40 mg via ORAL
  Filled 2020-07-12 (×9): qty 1

## 2020-07-12 MED ORDER — ASPIRIN 81 MG PO CHEW
81.0000 mg | CHEWABLE_TABLET | Freq: Every day | ORAL | Status: DC
  Administered 2020-07-13 – 2020-07-22 (×10): 81 mg via ORAL
  Filled 2020-07-12 (×10): qty 1

## 2020-07-12 MED ORDER — DOCUSATE SODIUM 50 MG/5ML PO LIQD: 100.0000 mg | Freq: Two times a day (BID) | ORAL | 0 refills | Status: DC

## 2020-07-12 MED ORDER — GUAIFENESIN-DM 100-10 MG/5ML PO SYRP: 5.0000 mL | ORAL_SOLUTION | Freq: Four times a day (QID) | ORAL | Status: DC | PRN

## 2020-07-12 MED ORDER — SENNOSIDES-DOCUSATE SODIUM 8.6-50 MG PO TABS: 1.0000 | ORAL_TABLET | Freq: Every evening | ORAL | Status: DC | PRN

## 2020-07-12 MED ORDER — CEFDINIR 300 MG PO CAPS
300.0000 mg | ORAL_CAPSULE | Freq: Two times a day (BID) | ORAL | Status: AC
  Administered 2020-07-12 – 2020-07-17 (×11): 300 mg via ORAL
  Filled 2020-07-12 (×11): qty 1

## 2020-07-12 MED ORDER — CLOPIDOGREL BISULFATE 75 MG PO TABS: 75.0000 mg | ORAL_TABLET | Freq: Every day | ORAL | Status: DC

## 2020-07-12 NOTE — Discharge Summary (Addendum)
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) s/p IR w/ revascularization Active Problems:   Middle cerebral artery embolism, right   Encephalopathy acute   History of CVA with residual deficit   Seizures (HCC)   Hyperlipidemia LDL goal <70   Dysphagia following cerebral infarction   Obesity   Leukocytosis   Dysphagia, post-stroke   Aspiration pneumonia (HCC)   Medications to be continued on Rehab Allergies as of 07/12/2020   No Known Allergies     Medication List    STOP taking these medications   aspirin EC 81 MG tablet Replaced by: aspirin 81 MG chewable tablet     TAKE these medications   acetaminophen 325 MG tablet Commonly known as: TYLENOL Take 2 tablets (650 mg total) by mouth every 4 (four) hours as needed for mild pain (or temp > 37.5 C (99.5 F)).   aspirin 81 MG chewable tablet Chew 1 tablet (81 mg total) by mouth daily. Start taking on: July 13, 2020 Replaces: aspirin EC 81 MG tablet   atorvastatin 80 MG tablet Commonly known as: LIPITOR Take 1 tablet (80 mg total) by mouth daily. Start taking on: July 13, 2020   cefdinir 300 MG capsule Commonly known as: OMNICEF Take 1 capsule (300 mg total) by mouth every 12 (twelve) hours for 14 days.   clopidogrel 75 MG tablet Commonly known as: PLAVIX Take 1 tablet (75 mg total) by mouth daily. Start taking on: July 13, 2020   diclofenac 1.3 % Ptch Commonly known as:  FLECTOR Place 1 patch onto the skin 2 (two) times daily.   docusate 50 MG/5ML liquid Commonly known as: COLACE Take 10 mLs (100 mg total) by mouth 2 (two) times daily.   enoxaparin 40 MG/0.4ML injection Commonly known as: LOVENOX Inject 0.4 mLs (40 mg total) into the skin daily. Start taking on: July 13, 2020   levETIRAcetam 500 MG tablet Commonly known as: KEPPRA Take 1 tablet (500 mg total) by mouth 2 (two) times daily.   lidocaine 5 % Commonly known as: LIDODERM Place 1 patch onto the skin daily. Remove & Discard patch within 12 hours or as directed by MD Start taking on: July 13, 2020   polyethylene glycol 17 g packet Commonly known as: MIRALAX / GLYCOLAX Take 17 g by mouth daily. Start taking on: July 13, 2020   Resource ThickenUp Clear Powd Take 120 g by mouth as needed (nectar thick liquids).   senna-docusate 8.6-50 MG tablet Commonly known as: Senokot-S Take 1 tablet by mouth at bedtime as needed for mild constipation.   senna-docusate 8.6-50 MG tablet Commonly known as: Senokot-S Take 1 tablet by mouth 2 (two) times daily.   sodium chloride 0.9 % infusion Inject 50 mLs into the vein continuous.            Discharge Care Instructions  (From admission, onward)         Start     Ordered   07/12/20 0000  Discharge wound care:  Comments: Per AVS   07/12/20 1455          LABORATORY STUDIES CBC    Component Value Date/Time   WBC 12.3 (H) 07/11/2020 0457   RBC 3.66 (L) 07/11/2020 0457   HGB 11.6 (L) 07/11/2020 0457   HCT 33.2 (L) 07/11/2020 0457   PLT 259 07/11/2020 0457   MCV 90.7 07/11/2020 0457   MCH 31.7 07/11/2020 0457   MCHC 34.9 07/11/2020 0457   RDW 11.2 (L) 07/11/2020 0457   LYMPHSABS 2.7 07/03/2020 1344   MONOABS 0.9 07/03/2020 1344   EOSABS 0.1 07/03/2020 1344   BASOSABS 0.1 07/03/2020 1344   CMP    Component Value Date/Time   NA 135 07/11/2020 0457   K 3.8 07/11/2020 0457   CL 100 07/11/2020 0457   CO2 26  07/11/2020 0457   GLUCOSE 111 (H) 07/11/2020 0457   BUN 16 07/11/2020 0457   CREATININE 0.92 07/11/2020 0457   CALCIUM 9.0 07/11/2020 0457   PROT 7.4 07/03/2020 1344   ALBUMIN 4.4 07/03/2020 1344   AST 39 07/03/2020 1344   ALT 36 07/03/2020 1344   ALKPHOS 86 07/03/2020 1344   BILITOT 0.7 07/03/2020 1344   GFRNONAA >60 07/11/2020 0457   COAGS Lab Results  Component Value Date   INR 1.0 07/03/2020   Lipid Panel    Component Value Date/Time   CHOL 293 (H) 07/04/2020 0358   TRIG 479 (H) 07/06/2020 0542   HDL 29 (L) 07/04/2020 0358   CHOLHDL 10.1 07/04/2020 0358   VLDL UNABLE TO CALCULATE IF TRIGLYCERIDE OVER 400 mg/dL 16/06/9603 5409   LDLCALC UNABLE TO CALCULATE IF TRIGLYCERIDE OVER 400 mg/dL 81/19/1478 2956   OZHY8M  Lab Results  Component Value Date   HGBA1C 5.6 07/04/2020   Urinalysis    Component Value Date/Time   COLORURINE AMBER (A) 07/10/2020 0311   APPEARANCEUR HAZY (A) 07/10/2020 0311   LABSPEC 1.030 07/10/2020 0311   PHURINE 5.0 07/10/2020 0311   GLUCOSEU NEGATIVE 07/10/2020 0311   HGBUR NEGATIVE 07/10/2020 0311   BILIRUBINUR NEGATIVE 07/10/2020 0311   KETONESUR NEGATIVE 07/10/2020 0311   PROTEINUR 30 (A) 07/10/2020 0311   NITRITE NEGATIVE 07/10/2020 0311   LEUKOCYTESUR NEGATIVE 07/10/2020 0311   Urine Drug Screen     Component Value Date/Time   LABOPIA NONE DETECTED 07/03/2020 1738   COCAINSCRNUR NONE DETECTED 07/03/2020 1738   LABBENZ NONE DETECTED 07/03/2020 1738   AMPHETMU NONE DETECTED 07/03/2020 1738   THCU NONE DETECTED 07/03/2020 1738   LABBARB NONE DETECTED 07/03/2020 1738    Alcohol Level    Component Value Date/Time   ETH <10 07/03/2020 1344     SIGNIFICANT DIAGNOSTIC STUDIES DG Chest 2 View  Result Date: 07/11/2020 CLINICAL DATA:  Recent infiltrate left base. EXAM: CHEST - 2 VIEW COMPARISON:  July 10, 2020 FINDINGS: Ill-defined airspace opacity remains in the left lower lung region. The lungs elsewhere are clear. Heart is  upper normal in size with pulmonary vascularity normal. No adenopathy. No bone lesions. IMPRESSION: Ill-defined opacity remains in left lower lobe concerning for pneumonia in this area. Lungs elsewhere clear. Heart upper normal in size. No evident adenopathy. Electronically Signed   By: Bretta Bang III M.D.   On: 07/11/2020 08:19   CT HEAD WO CONTRAST  Result Date: 07/03/2020 CLINICAL DATA:  34 year old male code stroke presentation left side weakness this afternoon positive for right ICA occlusion in the neck, right M1 occlusion. Initial ASPECTS 8. Treated with endovascular revascularization 1500 hours today. EXAM: CT  HEAD WITHOUT CONTRAST TECHNIQUE: Contiguous axial images were obtained from the base of the skull through the vertex without intravenous contrast. COMPARISON:  Presentation head CT 1350 hours. FINDINGS: Brain: Stable chronic appearing cerebellar infarcts, including a relatively large right SCA territory infarct. Contrast staining in the posterior lentiform nuclei. Superimposed cytotoxic edema in the right caudate and anterior lentiform, new from the presentation head CT. Mild associated mass effect on the right lateral ventricle, especially the frontal horn. Similar cytotoxic edema at the right insula and external capsule, some of which was present initially. Cytotoxic edema also again suspected at the anterior temporal tip. No acute intracranial hemorrhage identified. ASPECTS MCA segments 2 through 6 appear to remain normal. Contralateral left hemisphere gray-white matter differentiation is stable. No midline shift. Normal basilar cisterns. Vascular: Small volume residual intravascular contrast. Skull: Negative. Sinuses/Orbits: Visualized paranasal sinuses and mastoids are stable and well pneumatized. Other: No acute orbit or scalp soft tissue finding. Intubated. Small volume retained secretions in the visible pharynx. IMPRESSION: 1. No acute intracranial hemorrhage identified. Dense  contrast staining of the posterior right lentiform, where early cytotoxic edema was demonstrated on the presentation scan. 2. New and/or increased cytotoxic edema in the remaining right basal ganglia, right insula, anterior temporal lobe. Mild mass effect on the right lateral ventricle with no midline shift. 3. Stable right greater than left cerebellar infarcts, likely chronic. Electronically Signed   By: Odessa Fleming M.D.   On: 07/03/2020 23:48   MR BRAIN WO CONTRAST  Addendum Date: 07/04/2020   ADDENDUM REPORT: 07/04/2020 14:19 ADDENDUM: These results were called by telephone at the time of interpretation on 07/04/2020 at 2:12 pm to provider Dr. Roda Shutters , who verbally acknowledged these results. Electronically Signed   By: Stana Bunting M.D.   On: 07/04/2020 14:19   Result Date: 07/04/2020 CLINICAL DATA:  Stroke, follow-up. EXAM: MRI HEAD WITHOUT CONTRAST TECHNIQUE: Multiplanar, multiecho pulse sequences of the brain and surrounding structures were obtained without intravenous contrast. COMPARISON:  07/03/2020 noncontrast head CT and CTA head and neck. Later same day 07/03/2020 noncontrast head CT. FINDINGS: Brain: Multifocal restricted diffusion involving the right frontoparietal, occipitotemporal lobes, right insula and right basal ganglia. Remote bilateral cerebellar and right midbrain insults. Right lentiform SWI signal dropout correlates with contrast seen on prior CT. Partial effacement the right lateral ventricle. No midline shift, ventriculomegaly or extra-axial fluid collection. No mass lesion. Vascular: Please see recent CTA. Skull and upper cervical spine: Normal marrow signal. Sinuses/Orbits: Normal orbits. Mild maxillary sinus mucosal thickening with layering ethmoid and sphenoid secretions. Clear mastoid air cells. Other: None. IMPRESSION: Multifocal infarcts involving the right cerebrum and right basal ganglia. Redemonstration of right lentiform contrast. Remote bilateral cerebellar and right  midbrain insults. Electronically Signed: By: Stana Bunting M.D. On: 07/04/2020 14:04   IR CT Head Ltd  Result Date: 07/05/2020 INDICATION: Acute onset of seizures. Left-sided neglect and left-sided weakness with right gaze deviation. 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 EXAM: 1. EMERGENT LARGE VESSEL OCCLUSION THROMBOLYSIS (anterior CIRCULATION) COMPARISON:  CT angiogram of the head and neck of July 03, 2020. MEDICATIONS: Ancef 2 g IV antibiotic was administered within 1 hour of the procedure. ANESTHESIA/SEDATION: General anesthesia CONTRAST:  Omnipaque 300 approximately 120 mL FLUOROSCOPY TIME:  Fluoroscopy Time: 20 minutes 20 seconds (1427 mGy). COMPLICATIONS: None immediate. TECHNIQUE: Following a full explanation of the procedure along with the potential associated complications, an informed witnessed consent was obtained by the interpreter from his live-in girlfriend. The  risks of intracranial hemorrhage of 10%, worsening neurological deficit, ventilator dependency, death and inability to revascularize were all reviewed in detail with the patient's girlfriend. The patient was then put under general anesthesia by the Department of Anesthesiology at [facility]. The right groin was prepped and draped in the usual sterile fashion. Thereafter using modified Seldinger technique, transfemoral access into the right common femoral artery was obtained without difficulty. Over a 0.035 inch guidewire an 8 French 25 cm Pinnacle sheath was inserted. Through this, and also over a 0.035 inch guidewire a 5 [place] select JB 1 catheter inside of an 087 balloon guide catheter was advanced to the aortic arch region and selectively positioned in the right common carotid artery. The guidewire, and select diagnostic catheter were removed. Good aspiration obtained from the hub of the balloon guide catheter in the distal right common carotid artery. An arteriogram was then  performed centered intracranially and extracranially. FINDINGS: The right common carotid arteriogram demonstrates the right external carotid artery and its major branches to be widely patent. The right internal carotid artery at the bulb and distal to the bulb demonstrates a tapered angiographic appearance in its proximal 1/3 with complete occlusion with no filling defects and flap noted along the posterior wall proximally. No distal reconstitution is noted intracranially of the right ICA and cavernous and supraclinoid segments. PROCEDURE: Over a 0.014 inch standard Synchro micro guidewire with a J configuration, a combination of an 021 150 cm microcatheter inside of an 071 Zoom aspiration catheter was advanced without difficulty to the cavernous segment. The balloon guide catheter was now also advanced without any resistance to the distal cervical right ICA. A gentle control arteriogram performed through the balloon guide demonstrated complete occlusion of the right internal carotid artery at the distal cavernous segment. No antegrade flow was noticeable from the proximal right ICA. The micro guidewire was then gently advanced and manipulated with a torque device into the right middle cerebral artery proximally and then into the superior division followed by the microcatheter. Guidewire was removed. Slow aspiration of blood was obtained at the microcatheter hub. A very gentle contrast injection through the microcatheter demonstrated the tip of the microcatheter to be safely positioned with slow flow antegrade clearance of contrast. A 4 mm x 40 mm Solitaire retrieval device was then advanced to the distal end of the microcatheter. The Zoom aspiration catheter was now advanced into the distal right M1 segment. The O ring on the delivery microcatheter was then loosened. The retrieval device was then deployed in the usual fashion without difficulty. The wire was now retrieved more proximally in the Zoom aspiration  catheter. Thereafter, constant aspiration was applied at the hub of the Zoom aspiration catheter, and with 20 mL syringe at the hub of the balloon guide catheter for approximately 3 minutes. Thereafter, slow retrieval of the retrieval device, the microcatheter and the aspiration catheter was performed. Copious amounts of clot were seen in the canister of the aspiration device. Also noted was a piece of clot in the interstices of the retrieval device, and also in the Tuohy [place]. Following reversal of flow arrest in the right internal carotid artery, with free aspiration of blood obtained at the hub, a gentle control arteriogram performed demonstrates free flow in the right internal carotid artery distally in the petrous, cavernous and supraclinoid segments. Noted was opacification of the right middle cerebral artery and the right anterior cerebral artery distributions. Significant spasm was noted in the proximal right middle cerebral artery,  and of the superior division of the right middle cerebral artery. These responded to 3 aliquots of 25 mcg of nitroglycerin intra-arterially. At this time, also noted was contrast overlying the right basal ganglia region on angiography. CT of the brain on the table demonstrated dense focal hyper density in the putamen and less prominent in the right caudate head representing contrast with blood. No change was seen in the patient's hemodynamic status at this time. A control arteriogram performed through the balloon guide in the right internal carotid artery continued to demonstrate excellent flow extra cranially and intracranially with now spasm seen in the supraclinoid right ICA, and persistent moderate spasm in the proximal right M1 segment in the superior divisions. The patient was given 5 mg of intra-arterial verapamil slowly. A repeat arteriogram performed approximately 5 minutes later demonstrated modest improvement in the spasm in the right middle cerebral artery. Repeat  arteriograms at 20 minutes following the initial revascularization demonstrated significant improvement in the right middle cerebral artery and the right internal carotid artery vasospasm. There continued to be a TICI 3 revascularization. An exchange 0.014 inch Synchro micro guidewire was then advanced to the horizontal petrous segment. An arteriogram was then performed with the balloon guide catheter in the distal right common carotid artery. This continued to demonstrate excellent flow extra cranially and intracranially. No visible irregularity or an intimal flap was visible. Intracranially improvement is noted in the supraclinoid right ICA. No change was seen in the small focal outpouching in the right posterior communicating artery region suspicious for an aneurysm. A final control arteriogram performed through the right common carotid artery demonstrated patency of the right internal carotid artery extra cranially and intracranially without evidence of intraluminal filling defects or spasm. The right middle cerebral artery continued to maintain a TICI 3 revascularization. The right anterior cerebral artery remained widely patent. The balloon guide was removed. The 8 French Pinnacle sheath was then replaced with an 8 French Angio-Seal closure device for hemostasis in the right groin. Distal pulses remained palpable in both feet unchanged. Patient was left intubated on account of his medical condition. CT of the brain performed continued to demonstrate no change in the hyperdensity in the right basal ganglia region. Patient was then transferred to neuro ICU for post revascularization management. IMPRESSION: Status post endovascular complete revascularization of the right internal carotid artery terminus, the right middle cerebral artery, the right anterior cerebral artery, and the extracranial right internal carotid artery, with 1 pass with the 4 mm x 40 mm Solitaire retrieval device, with aspiration, and  proximal flow arrest achieving a TICI 3 revascularization. 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 PLAN: Follow-up in the clinic 4 to 6 weeks post discharge. Electronically Signed   By: Julieanne Cotton M.D.   On: 07/04/2020 11:00   IR CT Head Ltd  Result Date: 07/05/2020 INDICATION: Acute onset of seizures. Left-sided neglect and left-sided weakness with right gaze deviation. 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 EXAM: 1. EMERGENT LARGE VESSEL OCCLUSION THROMBOLYSIS (anterior CIRCULATION) COMPARISON:  CT angiogram of the head and neck of July 03, 2020. MEDICATIONS: Ancef 2 g IV antibiotic was administered within 1 hour of the procedure. ANESTHESIA/SEDATION: General anesthesia CONTRAST:  Omnipaque 300 approximately 120 mL FLUOROSCOPY TIME:  Fluoroscopy Time: 20 minutes 20 seconds (1427 mGy). COMPLICATIONS: None immediate. TECHNIQUE: Following a full explanation of the procedure along with the potential associated complications, an  informed witnessed consent was obtained by the interpreter from his live-in girlfriend. The risks of intracranial hemorrhage of 10%, worsening neurological deficit, ventilator dependency, death and inability to revascularize were all reviewed in detail with the patient's girlfriend. The patient was then put under general anesthesia by the Department of Anesthesiology at [facility]. The right groin was prepped and draped in the usual sterile fashion. Thereafter using modified Seldinger technique, transfemoral access into the right common femoral artery was obtained without difficulty. Over a 0.035 inch guidewire an 8 French 25 cm Pinnacle sheath was inserted. Through this, and also over a 0.035 inch guidewire a 5 [place] select JB 1 catheter inside of an 087 balloon guide catheter was advanced to the aortic arch region and selectively positioned in the right  common carotid artery. The guidewire, and select diagnostic catheter were removed. Good aspiration obtained from the hub of the balloon guide catheter in the distal right common carotid artery. An arteriogram was then performed centered intracranially and extracranially. FINDINGS: The right common carotid arteriogram demonstrates the right external carotid artery and its major branches to be widely patent. The right internal carotid artery at the bulb and distal to the bulb demonstrates a tapered angiographic appearance in its proximal 1/3 with complete occlusion with no filling defects and flap noted along the posterior wall proximally. No distal reconstitution is noted intracranially of the right ICA and cavernous and supraclinoid segments. PROCEDURE: Over a 0.014 inch standard Synchro micro guidewire with a J configuration, a combination of an 021 150 cm microcatheter inside of an 071 Zoom aspiration catheter was advanced without difficulty to the cavernous segment. The balloon guide catheter was now also advanced without any resistance to the distal cervical right ICA. A gentle control arteriogram performed through the balloon guide demonstrated complete occlusion of the right internal carotid artery at the distal cavernous segment. No antegrade flow was noticeable from the proximal right ICA. The micro guidewire was then gently advanced and manipulated with a torque device into the right middle cerebral artery proximally and then into the superior division followed by the microcatheter. Guidewire was removed. Slow aspiration of blood was obtained at the microcatheter hub. A very gentle contrast injection through the microcatheter demonstrated the tip of the microcatheter to be safely positioned with slow flow antegrade clearance of contrast. A 4 mm x 40 mm Solitaire retrieval device was then advanced to the distal end of the microcatheter. The Zoom aspiration catheter was now advanced into the distal right M1  segment. The O ring on the delivery microcatheter was then loosened. The retrieval device was then deployed in the usual fashion without difficulty. The wire was now retrieved more proximally in the Zoom aspiration catheter. Thereafter, constant aspiration was applied at the hub of the Zoom aspiration catheter, and with 20 mL syringe at the hub of the balloon guide catheter for approximately 3 minutes. Thereafter, slow retrieval of the retrieval device, the microcatheter and the aspiration catheter was performed. Copious amounts of clot were seen in the canister of the aspiration device. Also noted was a piece of clot in the interstices of the retrieval device, and also in the Tuohy [place]. Following reversal of flow arrest in the right internal carotid artery, with free aspiration of blood obtained at the hub, a gentle control arteriogram performed demonstrates free flow in the right internal carotid artery distally in the petrous, cavernous and supraclinoid segments. Noted was opacification of the right middle cerebral artery and the right anterior cerebral  artery distributions. Significant spasm was noted in the proximal right middle cerebral artery, and of the superior division of the right middle cerebral artery. These responded to 3 aliquots of 25 mcg of nitroglycerin intra-arterially. At this time, also noted was contrast overlying the right basal ganglia region on angiography. CT of the brain on the table demonstrated dense focal hyper density in the putamen and less prominent in the right caudate head representing contrast with blood. No change was seen in the patient's hemodynamic status at this time. A control arteriogram performed through the balloon guide in the right internal carotid artery continued to demonstrate excellent flow extra cranially and intracranially with now spasm seen in the supraclinoid right ICA, and persistent moderate spasm in the proximal right M1 segment in the superior divisions.  The patient was given 5 mg of intra-arterial verapamil slowly. A repeat arteriogram performed approximately 5 minutes later demonstrated modest improvement in the spasm in the right middle cerebral artery. Repeat arteriograms at 20 minutes following the initial revascularization demonstrated significant improvement in the right middle cerebral artery and the right internal carotid artery vasospasm. There continued to be a TICI 3 revascularization. An exchange 0.014 inch Synchro micro guidewire was then advanced to the horizontal petrous segment. An arteriogram was then performed with the balloon guide catheter in the distal right common carotid artery. This continued to demonstrate excellent flow extra cranially and intracranially. No visible irregularity or an intimal flap was visible. Intracranially improvement is noted in the supraclinoid right ICA. No change was seen in the small focal outpouching in the right posterior communicating artery region suspicious for an aneurysm. A final control arteriogram performed through the right common carotid artery demonstrated patency of the right internal carotid artery extra cranially and intracranially without evidence of intraluminal filling defects or spasm. The right middle cerebral artery continued to maintain a TICI 3 revascularization. The right anterior cerebral artery remained widely patent. The balloon guide was removed. The 8 French Pinnacle sheath was then replaced with an 8 French Angio-Seal closure device for hemostasis in the right groin. Distal pulses remained palpable in both feet unchanged. Patient was left intubated on account of his medical condition. CT of the brain performed continued to demonstrate no change in the hyperdensity in the right basal ganglia region. Patient was then transferred to neuro ICU for post revascularization management. IMPRESSION: Status post endovascular complete revascularization of the right internal carotid artery terminus,  the right middle cerebral artery, the right anterior cerebral artery, and the extracranial right internal carotid artery, with 1 pass with the 4 mm x 40 mm Solitaire retrieval device, with aspiration, and proximal flow arrest achieving a TICI 3 revascularization. 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 PLAN: Follow-up in the clinic 4 to 6 weeks post discharge. Electronically Signed   By: Julieanne Cotton M.D.   On: 07/04/2020 11:00   IR US Guide Vasc Access Right  Result Date: 07/05/2020 INDICATION: Acute onset of seizures. Left-sided neglect and left-sided weakness with right gaze deviation. 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 EXAM: 1. EMERGENT LARGE VESSEL OCCLUSION THROMBOLYSIS (anterior CIRCULATION) COMPARISON:  CT angiogram of the head and neck of July 03, 2020. MEDICATIONS: Ancef 2 g IV antibiotic was administered within 1 hour of the procedure. ANESTHESIA/SEDATION: General anesthesia CONTRAST:  Omnipaque 300 approximately 120 mL FLUOROSCOPY TIME:  Fluoroscopy Time: 20 minutes 20 seconds (1427 mGy). COMPLICATIONS: None immediate.  TECHNIQUE: Following a full explanation of the procedure along with the potential associated complications, an informed witnessed consent was obtained by the interpreter from his live-in girlfriend. The risks of intracranial hemorrhage of 10%, worsening neurological deficit, ventilator dependency, death and inability to revascularize were all reviewed in detail with the patient's girlfriend. The patient was then put under general anesthesia by the Department of Anesthesiology at [facility]. The right groin was prepped and draped in the usual sterile fashion. Thereafter using modified Seldinger technique, transfemoral access into the right common femoral artery was obtained without difficulty. Over a 0.035 inch guidewire an 8 French 25 cm Pinnacle  sheath was inserted. Through this, and also over a 0.035 inch guidewire a 5 [place] select JB 1 catheter inside of an 087 balloon guide catheter was advanced to the aortic arch region and selectively positioned in the right common carotid artery. The guidewire, and select diagnostic catheter were removed. Good aspiration obtained from the hub of the balloon guide catheter in the distal right common carotid artery. An arteriogram was then performed centered intracranially and extracranially. FINDINGS: The right common carotid arteriogram demonstrates the right external carotid artery and its major branches to be widely patent. The right internal carotid artery at the bulb and distal to the bulb demonstrates a tapered angiographic appearance in its proximal 1/3 with complete occlusion with no filling defects and flap noted along the posterior wall proximally. No distal reconstitution is noted intracranially of the right ICA and cavernous and supraclinoid segments. PROCEDURE: Over a 0.014 inch standard Synchro micro guidewire with a J configuration, a combination of an 021 150 cm microcatheter inside of an 071 Zoom aspiration catheter was advanced without difficulty to the cavernous segment. The balloon guide catheter was now also advanced without any resistance to the distal cervical right ICA. A gentle control arteriogram performed through the balloon guide demonstrated complete occlusion of the right internal carotid artery at the distal cavernous segment. No antegrade flow was noticeable from the proximal right ICA. The micro guidewire was then gently advanced and manipulated with a torque device into the right middle cerebral artery proximally and then into the superior division followed by the microcatheter. Guidewire was removed. Slow aspiration of blood was obtained at the microcatheter hub. A very gentle contrast injection through the microcatheter demonstrated the tip of the microcatheter to be safely  positioned with slow flow antegrade clearance of contrast. A 4 mm x 40 mm Solitaire retrieval device was then advanced to the distal end of the microcatheter. The Zoom aspiration catheter was now advanced into the distal right M1 segment. The O ring on the delivery microcatheter was then loosened. The retrieval device was then deployed in the usual fashion without difficulty. The wire was now retrieved more proximally in the Zoom aspiration catheter. Thereafter, constant aspiration was applied at the hub of the Zoom aspiration catheter, and with 20 mL syringe at the hub of the balloon guide catheter for approximately 3 minutes. Thereafter, slow retrieval of the retrieval device, the microcatheter and the aspiration catheter was performed. Copious amounts of clot were seen in the canister of the aspiration device. Also noted was a piece of clot in the interstices of the retrieval device, and also in the Tuohy Garfield. Following reversal of flow arrest in the right internal carotid artery, with free aspiration of blood obtained at the hub, a gentle control arteriogram performed demonstrates free flow in the right internal carotid artery distally in the petrous, cavernous and supraclinoid  segments. Noted was opacification of the right middle cerebral artery and the right anterior cerebral artery distributions. Significant spasm was noted in the proximal right middle cerebral artery, and of the superior division of the right middle cerebral artery. These responded to 3 aliquots of 25 mcg of nitroglycerin intra-arterially. At this time, also noted was contrast overlying the right basal ganglia region on angiography. CT of the brain on the table demonstrated dense focal hyper density in the putamen and less prominent in the right caudate head representing contrast with blood. No change was seen in the patient's hemodynamic status at this time. A control arteriogram performed through the balloon guide in the right internal  carotid artery continued to demonstrate excellent flow extra cranially and intracranially with now spasm seen in the supraclinoid right ICA, and persistent moderate spasm in the proximal right M1 segment in the superior divisions. The patient was given 5 mg of intra-arterial verapamil slowly. A repeat arteriogram performed approximately 5 minutes later demonstrated modest improvement in the spasm in the right middle cerebral artery. Repeat arteriograms at 20 minutes following the initial revascularization demonstrated significant improvement in the right middle cerebral artery and the right internal carotid artery vasospasm. There continued to be a TICI 3 revascularization. An exchange 0.014 inch Synchro micro guidewire was then advanced to the horizontal petrous segment. An arteriogram was then performed with the balloon guide catheter in the distal right common carotid artery. This continued to demonstrate excellent flow extra cranially and intracranially. No visible irregularity or an intimal flap was visible. Intracranially improvement is noted in the supraclinoid right ICA. No change was seen in the small focal outpouching in the right posterior communicating artery region suspicious for an aneurysm. A final control arteriogram performed through the right common carotid artery demonstrated patency of the right internal carotid artery extra cranially and intracranially without evidence of intraluminal filling defects or spasm. The right middle cerebral artery continued to maintain a TICI 3 revascularization. The right anterior cerebral artery remained widely patent. The balloon guide was removed. The 8 French Pinnacle sheath was then replaced with an 8 French Angio-Seal closure device for hemostasis in the right groin. Distal pulses remained palpable in both feet unchanged. Patient was left intubated on account of his medical condition. CT of the brain performed continued to demonstrate no change in the  hyperdensity in the right basal ganglia region. Patient was then transferred to neuro ICU for post revascularization management. IMPRESSION: Status post endovascular complete revascularization of the right internal carotid artery terminus, the right middle cerebral artery, the right anterior cerebral artery, and the extracranial right internal carotid artery, with 1 pass with the 4 mm x 40 mm Solitaire retrieval device, with aspiration, and proximal flow arrest achieving a TICI 3 revascularization. 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 PLAN: Follow-up in the clinic 4 to 6 weeks post discharge. Electronically Signed   By: Julieanne Cotton M.D.   On: 07/04/2020 11:00   DG CHEST PORT 1 VIEW  Result Date: 07/10/2020 CLINICAL DATA:  Fever EXAM: PORTABLE CHEST 1 VIEW COMPARISON:  07/04/2020 FINDINGS: Endotracheal tube is been removed. There is atelectasis and or infiltrate in both lower lobes since the previous exam. Upper lungs are clear. No visible effusion. IMPRESSION: Development of atelectasis and or infiltrate in both lower lobes. Endotracheal tube removed. Electronically Signed   By: Paulina Fusi M.D.   On: 07/10/2020 07:27   Portable Chest xray  Result Date: 07/04/2020 CLINICAL DATA:  Intubation. EXAM: PORTABLE CHEST 1 VIEW COMPARISON:  07/03/2020. FINDINGS: Endotracheal tube tip 3.7 cm above the carina. Mediastinum hilar structures appear stable. Heart size appears stable. Low lung volumes with bilateral subsegmental atelectasis. No pleural effusion or pneumothorax. IMPRESSION: 1. Endotracheal tube tip 3.7 cm above the carina. 2. Low lung volumes with bilateral subsegmental atelectasis. Electronically Signed   By: Maisie Fus  Register   On: 07/04/2020 06:50   Portable Chest x-ray  Result Date: 07/03/2020 CLINICAL DATA:  Respiratory failure. EXAM: PORTABLE CHEST 1 VIEW COMPARISON:  None. FINDINGS: An endotracheal tube  is seen with its distal tip approximately 3.2 cm from the carina. There is no evidence of acute infiltrate, pleural effusion or pneumothorax. The heart size and mediastinal contours are within normal limits. The visualized skeletal structures are unremarkable. IMPRESSION: Endotracheal tube positioning, as described above, without evidence of acute or active cardiopulmonary disease. Electronically Signed   By: Aram Candela M.D.   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 EEG to evaluate for seizure Level of alertness: Awake AEDs during EEG study: LEV Technical aspects: This EEG study was done with scalp electrodes positioned according to the 10-20 International system of electrode placement. Electrical activity was acquired at a sampling rate of 500Hz  and reviewed with a high frequency filter of 70Hz  and a low frequency filter of 1Hz . EEG data were recorded continuously and digitally stored. Description: EEG showed continuous generalized and maximal right temporal region 2-3Hz  with overriding 13-14Hz  generalized beta activity. Hyperventilation and photic stimulation were not performed.   ABNORMALITY -Continuous slow, generalized IMPRESSION: This study is suggestive of severe diffuse encephalopathy, nonspecific etiology. No seizures or epileptiform discharges were seen throughout the recording. Charlsie Quest   ECHOCARDIOGRAM COMPLETE  Result Date: 07/04/2020    ECHOCARDIOGRAM REPORT   Patient Name:   GERGORY BIELLO Date of Exam: 07/04/2020 Medical Rec #:  [id number]    Height:       64.0 in Accession #:    [id number]   Weight:        199.1 lb Date of Birth:  [birth year]     BSA:          1.953 m Patient Age:    34 years     BP:           141/67 mmHg Patient Gender: M            HR:           91 bpm. Exam Location:  Inpatient Procedure: 2D Echo Indications:   434.91 stroke  History:       Patient has no prior history of Echocardiogram examinations.                Stroke.  Sonographer:   Celene Skeen RDCS (AE) Referring      [id number] [facility] Phys:  Sonographer Comments: Restricted mobility. poor patient compliance (unable to remain still at times) IMPRESSIONS  1. Left ventricular ejection fraction, by estimation, is 60 to 65%. The left ventricle has normal function. The left ventricle has no regional wall motion abnormalities. Left ventricular diastolic parameters were normal.  2. Right ventricular systolic function is normal. The right ventricular size is normal.  3. The mitral valve is grossly normal. No evidence of mitral valve regurgitation.  4. The aortic valve was not well visualized. Aortic valve regurgitation is not visualized. Comparison(s): No prior Echocardiogram. FINDINGS  Left Ventricle: Left ventricular ejection fraction, by estimation, is 60 to 65%. The left ventricle has normal function. The left ventricle has no regional wall motion abnormalities. The left ventricular internal cavity size was normal in size. There is  no left ventricular hypertrophy. Left ventricular diastolic parameters were normal. Right Ventricle: The right ventricular size is normal. Right vetricular wall thickness was not well visualized. Right ventricular systolic function is normal. Left Atrium: Left atrial size was normal in size. Right Atrium: Right atrial size was normal in size. Pericardium: There is no evidence of pericardial effusion. Mitral Valve: The mitral valve is grossly normal. No evidence of mitral valve regurgitation. Tricuspid Valve: The tricuspid valve is grossly normal. Tricuspid valve regurgitation is not demonstrated. Aortic  Valve: The aortic valve was not well visualized. Aortic valve regurgitation is not visualized. Pulmonic Valve: The pulmonic valve was grossly normal. Pulmonic valve regurgitation is not visualized. Aorta: The aortic root is normal in size and structure. IAS/Shunts: The interatrial septum was not assessed.  LEFT VENTRICLE PLAX 2D LVIDd:         4.50 cm  Diastology LVIDs:         2.40 cm  LV e' medial:    10.70 cm/s LV PW:         0.90 cm  LV E/e' medial:  10.4 LV IVS:        1.10 cm  LV e' lateral:   15.90 cm/s LVOT diam:     2.30 cm  LV E/e' lateral: 7.0 LV SV:         79 LV SV Index:   40 LVOT Area:     4.15 cm  RIGHT VENTRICLE RV S prime:     15.30 cm/s TAPSE (M-mode): 1.7 cm LEFT ATRIUM             Index LA diam:        3.00 cm 1.54 cm/m LA Vol (A2C):   29.6 ml 15.16 ml/m LA Vol (A4C):   32.9 ml 16.85 ml/m LA Biplane Vol: 31.4 ml 16.08 ml/m  AORTIC VALVE LVOT Vmax:   111.00 cm/s LVOT Vmean:  74.900 cm/s LVOT VTI:    0.189 m  AORTA Ao Root diam: 2.80 cm MITRAL VALVE MV Area (PHT): 5.75 cm     SHUNTS MV Decel Time: 132 msec     Systemic VTI:  0.19 m MV E velocity: 111.00 cm/s  Systemic Diam: 2.30 cm MV A velocity: 67.30 cm/s MV E/A ratio:  1.65 Riley Lam MD Electronically signed by Riley Lam MD Signature Date/Time: 07/04/2020/2:33:40 PM    Final    ECHO TEE  Result Date: 07/06/2020    TRANSESOPHOGEAL ECHO REPORT   Patient Name:   MATHEO RATHBONE Date of Exam: 07/06/2020 Medical Rec #:  [id number]    Height:       64.0 in Accession #:    [id number]   Weight:       199.1 lb Date of Birth:  04-Oct-1985     BSA:          1.953 m Patient Age:    34 years     BP:           110/56 mmHg  Patient Gender: M            HR:           78 bpm. Exam Location:  Inpatient Procedure: Transesophageal Echo Indications:    stroke 434.91  History:        Patient has prior history of Echocardiogram examinations, most                 recent 07/04/2020. Stroke; Risk Factors:Former Smoker.  Sonographer:    Celene Skeen RDCS (AE) Referring Phys: [id number] DAVID L RINEHULS PROCEDURE: The transesophogeal probe was passed without difficulty through the esophogus of the patient. Sedation performed by different physician. The patient developed no complications during the procedure. IMPRESSIONS  1. Left ventricular ejection fraction, by estimation, is 60 to 65%. The left ventricle has normal function.  2. Right ventricular systolic function is normal. The right ventricular size is normal.  3. No left atrial/left atrial appendage thrombus was detected.  4. The mitral valve is normal in structure. Trivial mitral valve regurgitation.  5. The aortic valve is tricuspid. Aortic valve regurgitation is not visualized. No aortic stenosis is present.  6. Agitated saline contrast bubble study was negative, with no evidence of any interatrial shunt. FINDINGS  Left Ventricle: Left ventricular ejection fraction, by estimation, is 60 to 65%. The left ventricle has normal function. The left ventricular internal cavity size was normal in size. Right Ventricle: The right ventricular size is normal. Right vetricular wall thickness was not assessed. Right ventricular systolic function is normal. Left Atrium: Left atrial size was normal in size. No left atrial/left atrial appendage thrombus was detected. Right Atrium: Right atrial size was normal in size. Pericardium: There is no evidence of pericardial effusion. Mitral Valve: The mitral valve is normal in structure. Trivial mitral valve regurgitation. Tricuspid Valve: The tricuspid valve is normal in structure. Tricuspid valve regurgitation is trivial. Aortic Valve: The aortic valve is tricuspid. Aortic valve regurgitation is not visualized. No aortic stenosis is present. Pulmonic Valve: The pulmonic valve was grossly normal. Pulmonic valve regurgitation is not visualized. Aorta: The aortic root and ascending aorta are structurally normal, with no evidence of dilitation. IAS/Shunts: No atrial level shunt  detected by color flow Doppler. Agitated saline contrast bubble study was negative, with no evidence of any interatrial shunt. Epifanio Lesches MD Electronically signed by Epifanio Lesches MD Signature Date/Time: 07/06/2020/2:06:56 PM    Final    IR PERCUTANEOUS ART THROMBECTOMY/INFUSION INTRACRANIAL INC DIAG ANGIO  Result Date: 07/05/2020 INDICATION: Acute onset of seizures. Left-sided neglect and left-sided weakness with right gaze deviation. 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 EXAM: 1. EMERGENT LARGE VESSEL OCCLUSION THROMBOLYSIS (anterior CIRCULATION) COMPARISON:  CT angiogram of the head and neck of July 03, 2020. MEDICATIONS: Ancef 2 g IV antibiotic was administered within 1 hour of the procedure. ANESTHESIA/SEDATION: General anesthesia CONTRAST:  Omnipaque 300 approximately 120 mL FLUOROSCOPY TIME:  Fluoroscopy Time: 20 minutes 20 seconds (1427 mGy). COMPLICATIONS: None immediate. TECHNIQUE: Following a full explanation of the procedure along with the potential associated complications, an informed witnessed consent was obtained by the interpreter from his live-in girlfriend. The risks of intracranial hemorrhage of 10%, worsening neurological deficit, ventilator dependency, death and inability to revascularize were all reviewed in detail with the patient's girlfriend. The patient was then put under general anesthesia by the Department of Anesthesiology at [facility]. The right groin was prepped and draped in the usual sterile fashion. Thereafter using modified Seldinger  technique, transfemoral access into the right common femoral artery was obtained without difficulty. Over a 0.035 inch guidewire an 8 French 25 cm Pinnacle sheath was inserted. Through this, and also over a 0.035 inch guidewire a 5 [place] select JB 1 catheter inside of an 087 balloon guide catheter was advanced to the aortic arch region and selectively positioned in the right common  carotid artery. The guidewire, and select diagnostic catheter were removed. Good aspiration obtained from the hub of the balloon guide catheter in the distal right common carotid artery. An arteriogram was then performed centered intracranially and extracranially. FINDINGS: The right common carotid arteriogram demonstrates the right external carotid artery and its major branches to be widely patent. The right internal carotid artery at the bulb and distal to the bulb demonstrates a tapered angiographic appearance in its proximal 1/3 with complete occlusion with no filling defects and flap noted along the posterior wall proximally. No distal reconstitution is noted intracranially of the right ICA and cavernous and supraclinoid segments. PROCEDURE: Over a 0.014 inch standard Synchro micro guidewire with a J configuration, a combination of an 021 150 cm microcatheter inside of an 071 Zoom aspiration catheter was advanced without difficulty to the cavernous segment. The balloon guide catheter was now also advanced without any resistance to the distal cervical right ICA. A gentle control arteriogram performed through the balloon guide demonstrated complete occlusion of the right internal carotid artery at the distal cavernous segment. No antegrade flow was noticeable from the proximal right ICA. The micro guidewire was then gently advanced and manipulated with a torque device into the right middle cerebral artery proximally and then into the superior division followed by the microcatheter. Guidewire was removed. Slow aspiration of blood was obtained at the microcatheter hub. A very gentle contrast injection through the microcatheter demonstrated the tip of the microcatheter to be safely positioned with slow flow antegrade clearance of contrast. A 4 mm x 40 mm Solitaire retrieval device was then advanced to the distal end of the microcatheter. The Zoom aspiration catheter was now advanced into the distal right M1 segment.  The O ring on the delivery microcatheter was then loosened. The retrieval device was then deployed in the usual fashion without difficulty. The wire was now retrieved more proximally in the Zoom aspiration catheter. Thereafter, constant aspiration was applied at the hub of the Zoom aspiration catheter, and with 20 mL syringe at the hub of the balloon guide catheter for approximately 3 minutes. Thereafter, slow retrieval of the retrieval device, the microcatheter and the aspiration catheter was performed. Copious amounts of clot were seen in the canister of the aspiration device. Also noted was a piece of clot in the interstices of the retrieval device, and also in the Tuohy [place]. Following reversal of flow arrest in the right internal carotid artery, with free aspiration of blood obtained at the hub, a gentle control arteriogram performed demonstrates free flow in the right internal carotid artery distally in the petrous, cavernous and supraclinoid segments. Noted was opacification of the right middle cerebral artery and the right anterior cerebral artery distributions. Significant spasm was noted in the proximal right middle cerebral artery, and of the superior division of the right middle cerebral artery. These responded to 3 aliquots of 25 mcg of nitroglycerin intra-arterially. At this time, also noted was contrast overlying the right basal ganglia region on angiography. CT of the brain on the table demonstrated dense focal hyper density in the putamen and less prominent in the right  caudate head representing contrast with blood. No change was seen in the patient's hemodynamic status at this time. A control arteriogram performed through the balloon guide in the right internal carotid artery continued to demonstrate excellent flow extra cranially and intracranially with now spasm seen in the supraclinoid right ICA, and persistent moderate spasm in the proximal right M1 segment in the superior divisions. The  patient was given 5 mg of intra-arterial verapamil slowly. A repeat arteriogram performed approximately 5 minutes later demonstrated modest improvement in the spasm in the right middle cerebral artery. Repeat arteriograms at 20 minutes following the initial revascularization demonstrated significant improvement in the right middle cerebral artery and the right internal carotid artery vasospasm. There continued to be a TICI 3 revascularization. An exchange 0.014 inch Synchro micro guidewire was then advanced to the horizontal petrous segment. An arteriogram was then performed with the balloon guide catheter in the distal right common carotid artery. This continued to demonstrate excellent flow extra cranially and intracranially. No visible irregularity or an intimal flap was visible. Intracranially improvement is noted in the supraclinoid right ICA. No change was seen in the small focal outpouching in the right posterior communicating artery region suspicious for an aneurysm. A final control arteriogram performed through the right common carotid artery demonstrated patency of the right internal carotid artery extra cranially and intracranially without evidence of intraluminal filling defects or spasm. The right middle cerebral artery continued to maintain a TICI 3 revascularization. The right anterior cerebral artery remained widely patent. The balloon guide was removed. The 8 French Pinnacle sheath was then replaced with an 8 French Angio-Seal closure device for hemostasis in the right groin. Distal pulses remained palpable in both feet unchanged. Patient was left intubated on account of his medical condition. CT of the brain performed continued to demonstrate no change in the hyperdensity in the right basal ganglia region. Patient was then transferred to neuro ICU for post revascularization management. IMPRESSION: Status post endovascular complete revascularization of the right internal carotid artery terminus, the  right middle cerebral artery, the right anterior cerebral artery, and the extracranial right internal carotid artery, with 1 pass with the 4 mm x 40 mm Solitaire retrieval device, with aspiration, and proximal flow arrest achieving a TICI 3 revascularization. 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 PLAN: Follow-up in the clinic 4 to 6 weeks post discharge. Electronically Signed   By: Julieanne Cotton M.D.   On: 07/04/2020 11:00   CT HEAD CODE STROKE WO CONTRAST  Result Date: 07/03/2020 CLINICAL DATA:  Code stroke.  Left-sided weakness EXAM: CT HEAD WITHOUT CONTRAST TECHNIQUE: Contiguous axial images were obtained from the base of the skull through the vertex without intravenous contrast. COMPARISON:  None. FINDINGS: Brain: There is no acute intracranial hemorrhage. There is suspected hypoattenuation and loss of gray-white differentiation involving the anterior right temporal lobe and inferior lentiform nucleus. There is no mass effect. Ventricles and sulci are within normal limits in size and configuration. There are age-indeterminate but probably chronic right greater than left cerebellar infarcts. Vascular: There is increased density along the right M1 MCA. Skull: Unremarkable. Sinuses/Orbits: No acute abnormality. Other: Mastoid air cells are clear. ASPECTS [facility] Stroke Program Early CT Score) - Ganglionic level infarction (caudate, lentiform nuclei, internal capsule, insula, M1-M3 cortex): 5 - Supraganglionic infarction (M4-M6 cortex): 3 Total score (0-10 with 10 being normal): 8 IMPRESSION: There is no acute intracranial hemorrhage. Suspected thrombus within the right  MCA and acute right MCA territory infarction. ASPECT score is 8. These results were called by telephone at the time of interpretation on 07/03/2020 at 1:54 pm to provider [facility] , who verbally acknowledged these results. Electronically Signed    By: Guadlupe Spanish M.D.   On: 07/03/2020 14:06   VAS [place] LOWER EXTREMITY VENOUS (DVT)  Result Date: 07/04/2020  Lower Venous DVTStudy Indications: Edema.  Risk Factors: Immobility. Performing Technologist: Jannet Askew RCT RDMS  Examination Guidelines: A complete evaluation includes B-mode imaging, spectral Doppler, color Doppler, and power Doppler as needed of all accessible portions of each vessel. Bilateral testing is considered an integral part of a complete examination. Limited examinations for reoccurring indications may be performed as noted. The reflux portion of the exam is performed with the patient in reverse Trendelenburg.  +---------+---------------+---------+-----------+----------+--------------+ RIGHT    CompressibilityPhasicitySpontaneityPropertiesThrombus Aging +---------+---------------+---------+-----------+----------+--------------+ CFV      Full           Yes      Yes                                 +---------+---------------+---------+-----------+----------+--------------+ SFJ      Full                                                        +---------+---------------+---------+-----------+----------+--------------+ FV Prox  Full                                                        +---------+---------------+---------+-----------+----------+--------------+ FV Mid   Full                                                        +---------+---------------+---------+-----------+----------+--------------+ FV DistalFull                                                        +---------+---------------+---------+-----------+----------+--------------+ PFV      Full                                                        +---------+---------------+---------+-----------+----------+--------------+ POP      Full           Yes      Yes                                 +---------+---------------+---------+-----------+----------+--------------+ PTV       Full                                                        +---------+---------------+---------+-----------+----------+--------------+  PERO     Full                                                        +---------+---------------+---------+-----------+----------+--------------+   +---------+---------------+---------+-----------+----------+--------------+ LEFT     CompressibilityPhasicitySpontaneityPropertiesThrombus Aging +---------+---------------+---------+-----------+----------+--------------+ CFV      Full           Yes      Yes                                 +---------+---------------+---------+-----------+----------+--------------+ SFJ      Full                                                        +---------+---------------+---------+-----------+----------+--------------+ FV Prox  Full                                                        +---------+---------------+---------+-----------+----------+--------------+ FV Mid   Full                                                        +---------+---------------+---------+-----------+----------+--------------+ FV DistalFull                                                        +---------+---------------+---------+-----------+----------+--------------+ PFV      Full                                                        +---------+---------------+---------+-----------+----------+--------------+ POP      Full           Yes      Yes                                 +---------+---------------+---------+-----------+----------+--------------+ PTV      Full                                                        +---------+---------------+---------+-----------+----------+--------------+ PERO     Full                                                        +---------+---------------+---------+-----------+----------+--------------+  Summary: RIGHT: - There is no evidence of deep  vein thrombosis in the lower extremity.  - No cystic structure found in the popliteal fossa.  LEFT: - There is no evidence of deep vein thrombosis in the lower extremity.  - No cystic structure found in the popliteal fossa.  *See table(s) above for measurements and observations. Electronically signed by Gretta Began MD on 07/04/2020 at 4:25:09 PM.    Final    CT ANGIO HEAD CODE STROKE  Result Date: 07/03/2020 CLINICAL DATA:  Code stroke follow-up EXAM: CT ANGIOGRAPHY HEAD AND NECK TECHNIQUE: Multidetector CT imaging of the head and neck was performed using the standard protocol during bolus administration of intravenous contrast. Multiplanar CT image reconstructions and MIPs were obtained to evaluate the vascular anatomy. Carotid stenosis measurements (when applicable) are obtained utilizing NASCET criteria, using the distal internal carotid diameter as the denominator. CONTRAST:  60mL OMNIPAQUE IOHEXOL 350 MG/ML SOLN COMPARISON:  None. FINDINGS: CTA NECK FINDINGS Aortic arch: Great vessel origins are patent. Right carotid system: Common and external carotid arteries are patent. There is occlusion of the cervical ICA approximately 2 cm above the origin. No reconstitution in the neck. Left carotid system: Patent. Vertebral arteries: Patent.  Left vertebral artery is dominant. Skeleton: No acute abnormality. Other neck: No significant abnormality. Upper chest: Included upper lungs are clear. Review of the MIP images confirms the above findings CTA HEAD FINDINGS Anterior circulation: There is occlusion of the intracranial right ICA to the level of the PCOM origin where there is reconstitution. There is subsequent occlusion of the proximal right M1 MCA extending to the bifurcation where there is partial reconstitution. Left intracranial internal carotid, left middle cerebral artery, and both anterior cerebral arteries are patent. Mild calcified plaque along the left ICA. Posterior circulation: Intracranial vertebral  arteries, basilar artery, and posterior cerebral arteries are patent. There are right larger than left posterior communicating arteries. Venous sinuses: As permitted by contrast timing, patent. 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 Reconstitution intracranially by a posterior communicating artery. Subsequent occlusion of the proximal right M1 MCA extending to the bifurcation where there is partial reconstitution. These results were communicated to Dr. Derry Lory at 2:13 pm on 07/03/2020 by text page via the [facility] messaging system. Electronically Signed   By: Guadlupe Spanish M.D.   On: 07/03/2020 14:23   CT ANGIO NECK CODE STROKE  Result Date: 07/03/2020 CLINICAL DATA:  Code stroke follow-up EXAM: CT ANGIOGRAPHY HEAD AND NECK TECHNIQUE: Multidetector CT imaging of the head and neck was performed using the standard protocol during bolus administration of intravenous contrast. Multiplanar CT image reconstructions and MIPs were obtained to evaluate the vascular anatomy. Carotid stenosis measurements (when applicable) are obtained utilizing NASCET criteria, using the distal internal carotid diameter as the denominator. CONTRAST:  60mL OMNIPAQUE IOHEXOL 350 MG/ML SOLN COMPARISON:  None. FINDINGS: CTA NECK FINDINGS Aortic arch: Great vessel origins are patent. Right carotid system: Common and external carotid arteries are patent. There is occlusion of the cervical ICA approximately 2 cm above the origin. No reconstitution in the neck. Left carotid system: Patent. Vertebral arteries: Patent.  Left vertebral artery is dominant. Skeleton: No acute abnormality. Other neck: No significant abnormality. Upper chest: Included upper lungs are clear. Review of the MIP images confirms the above findings CTA HEAD FINDINGS Anterior circulation: There is occlusion of the intracranial right ICA to the level of the PCOM origin where there  is reconstitution. There is subsequent occlusion of the proximal right M1 MCA extending to the bifurcation where there is partial reconstitution. Left intracranial internal carotid, left middle cerebral artery, and both anterior cerebral arteries are patent. Mild calcified plaque along the left ICA. Posterior circulation: Intracranial vertebral arteries, basilar artery, and posterior cerebral arteries are patent. There are right larger than left posterior communicating arteries. Venous sinuses: As permitted by contrast timing, patent. 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 Reconstitution intracranially by a posterior communicating artery. Subsequent occlusion of the proximal right M1 MCA extending to the bifurcation where there is partial reconstitution. These results were communicated to Dr. Derry LoryKhaliqdina at 2:13 pm on 07/03/2020 by text page via the [facility] messaging system. Electronically Signed   By: Guadlupe SpanishPraneil  Patel M.D.   On: 07/03/2020 14:23   IR ANGIO INTRA EXTRACRAN SEL COM CAROTID INNOMINATE UNI L MOD SED  Result Date: 07/05/2020 INDICATION: Acute onset of seizures. Left-sided neglect and left-sided weakness with right gaze deviation. 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 EXAM: 1. EMERGENT LARGE VESSEL OCCLUSION THROMBOLYSIS (anterior CIRCULATION) COMPARISON:  CT angiogram of the head and neck of July 03, 2020. MEDICATIONS: Ancef 2 g IV antibiotic was administered within 1 hour of the procedure. ANESTHESIA/SEDATION: General anesthesia CONTRAST:  Omnipaque 300 approximately 120 mL FLUOROSCOPY TIME:  Fluoroscopy Time: 20 minutes 20 seconds (1427 mGy). COMPLICATIONS: None immediate. TECHNIQUE: Following a full explanation of the procedure along with the potential associated complications, an informed witnessed consent was obtained by the interpreter from his live-in girlfriend.  The risks of intracranial hemorrhage of 10%, worsening neurological deficit, ventilator dependency, death and inability to revascularize were all reviewed in detail with the patient's girlfriend. The patient was then put under general anesthesia [facility]. The right groin was prepped and draped in the usual sterile fashion. Thereafter using modified Seldinger technique, transfemoral access into the right common femoral artery was obtained without difficulty. Over a 0.035 inch guidewire an 8 French 25 cm Pinnacle sheath was inserted. Through this, and also over a 0.035 inch guidewire a 5 [place] select JB 1 catheter inside of an 087 balloon guide catheter was advanced to the aortic arch region and selectively positioned in the right common carotid artery. The guidewire, and select diagnostic catheter were removed. Good aspiration obtained from the hub of the balloon guide catheter in the distal right common carotid artery. An arteriogram was then performed centered intracranially and extracranially. FINDINGS: The right common carotid arteriogram demonstrates the right external carotid artery and its major branches to be widely patent. The right internal carotid artery at the bulb and distal to the bulb demonstrates a tapered angiographic appearance in its proximal 1/3 with complete occlusion with no filling defects and flap noted along the posterior wall proximally. No distal reconstitution is noted intracranially of the right ICA and cavernous and supraclinoid segments. PROCEDURE: Over a 0.014 inch standard Synchro micro guidewire with a J configuration, a combination of an 021 150 cm microcatheter inside of an 071 Zoom aspiration catheter was advanced without difficulty to the cavernous segment. The balloon guide catheter was now also advanced without any resistance to the distal cervical right ICA. A gentle control arteriogram performed through the balloon guide  demonstrated complete occlusion of the right internal carotid artery at the distal cavernous segment. No antegrade flow was noticeable from the  proximal right ICA. The micro guidewire was then gently advanced and manipulated with a torque device into the right middle cerebral artery proximally and then into the superior division followed by the microcatheter. Guidewire was removed. Slow aspiration of blood was obtained at the microcatheter hub. A very gentle contrast injection through the microcatheter demonstrated the tip of the microcatheter to be safely positioned with slow flow antegrade clearance of contrast. A 4 mm x 40 mm Solitaire retrieval device was then advanced to the distal end of the microcatheter. The Zoom aspiration catheter was now advanced into the distal right M1 segment. The O ring on the delivery microcatheter was then loosened. The retrieval device was then deployed in the usual fashion without difficulty. The wire was now retrieved more proximally in the Zoom aspiration catheter. Thereafter, constant aspiration was applied at the hub of the Zoom aspiration catheter, and with 20 mL syringe at the hub of the balloon guide catheter for approximately 3 minutes. Thereafter, slow retrieval of the retrieval device, the microcatheter and the aspiration catheter was performed. Copious amounts of clot were seen in the canister of the aspiration device. Also noted was a piece of clot in the interstices of the retrieval device, and also in the Tuohy Talmo. Following reversal of flow arrest in the right internal carotid artery, with free aspiration of blood obtained at the hub, a gentle control arteriogram performed demonstrates free flow in the right internal carotid artery distally in the petrous, cavernous and supraclinoid segments. Noted was opacification of the right middle cerebral artery and the right anterior cerebral artery distributions. Significant spasm was noted in the proximal right middle  cerebral artery, and of the superior division of the right middle cerebral artery. These responded to 3 aliquots of 25 mcg of nitroglycerin intra-arterially. At this time, also noted was contrast overlying the right basal ganglia region on angiography. CT of the brain on the table demonstrated dense focal hyper density in the putamen and less prominent in the right caudate head representing contrast with blood. No change was seen in the patient's hemodynamic status at this time. A control arteriogram performed through the balloon guide in the right internal carotid artery continued to demonstrate excellent flow extra cranially and intracranially with now spasm seen in the supraclinoid right ICA, and persistent moderate spasm in the proximal right M1 segment in the superior divisions. The patient was given 5 mg of intra-arterial verapamil slowly. A repeat arteriogram performed approximately 5 minutes later demonstrated modest improvement in the spasm in the right middle cerebral artery. Repeat arteriograms at 20 minutes following the initial revascularization demonstrated significant improvement in the right middle cerebral artery and the right internal carotid artery vasospasm. There continued to be a TICI 3 revascularization. An exchange 0.014 inch Synchro micro guidewire was then advanced to the horizontal petrous segment. An arteriogram was then performed with the balloon guide catheter in the distal right common carotid artery. This continued to demonstrate excellent flow extra cranially and intracranially. No visible irregularity or an intimal flap was visible. Intracranially improvement is noted in the supraclinoid right ICA. No change was seen in the small focal outpouching in the right posterior communicating artery region suspicious for an aneurysm. A final control arteriogram performed through the right common carotid artery demonstrated patency of the right internal carotid artery extra cranially and  intracranially without evidence of intraluminal filling defects or spasm. The right middle cerebral artery continued to maintain a TICI 3 revascularization. The right anterior cerebral artery remained  widely patent. The balloon guide was removed. The 8 French Pinnacle sheath was then replaced with an 8 French Angio-Seal closure device for hemostasis in the right groin. Distal pulses remained palpable in both feet unchanged. Patient was left intubated on account of his medical condition. CT of the brain performed continued to demonstrate no change in the hyperdensity in the right basal ganglia region. Patient was then transferred to neuro ICU for post revascularization management. IMPRESSION: Status post endovascular complete revascularization of the right internal carotid artery terminus, the right middle cerebral artery, the right anterior cerebral artery, and the extracranial right internal carotid artery, with 1 pass with the 4 mm x 40 mm Solitaire retrieval device, with aspiration, and proximal flow arrest achieving a TICI 3 revascularization. 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 PLAN: Follow-up in the clinic 4 to 6 weeks post discharge. Electronically Signed   By: Julieanne Cotton M.D.   On: 07/04/2020 11:00       HISTORY OF PRESENT ILLNESS Fredick Schlosser is a 34 y.o. 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 Per his partner of 8 years, he walks with a slight limp bus is otherwise fine, took him 2 months to recover. His partner got back from the grocery store and patient was able to help her get the groceries inside at around 12:30.   Per EMS patient was last known well at 12:30 on 07/03/20. His words appeared a little slurred when he brought some groceries in, he was feeling a little numb but significant other is not entirely sure what  side it was. Then he collapsed on the ground and had jerking of his arms and legs concerning for a seizure. She called EMS, who arrived at the scene and found him to be weak in the LUE and LLE and gaze edviation to the right. He seemed awake, alert and talking to them. Premorbid modified Rankin scale (mRS): 0.  NIHSS: 17.   CTH with hyperdense RMCA and ASPECTS of 8. tPA offered. MD initially could not get in touch with the significant other about tPA but when able, did discuss with her and she was okay with giving him tPA given the noted concern for stroke and hyperdense RMCA suggestive of an LVO.  CTA head and neck with occlusion of the cervical right ICA with intracranial reconstitution and subsequent occlusion of the proximal right M1 MCA.  Neurology was eventually able to get in touch with the significant other prior to thrombectomy and Dr. Corliss Skains did discuss risks and benefits and Ms. [name] gave [place] her consent for thrombectomy.  Thrombectomy with TICI 3 revascularization of the occluded right ICA and right MCA and right ACA. Patient was noted to have a right putamen and caudate hematoma in IR which was noted to be stable on repeat IR CT head about 2 hours later.  Given that the ICH size was stable, we opted against reversing TPA and instead closely monitoring with another CT head in about 6 hours and tightly controlling blood pressure.  Fibrinogen levels were ordered stat in case reversal is needed.   HOSPITAL COURSE Mr. Timoty Bourke is a 34 y.o. 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 Found to  have L sided weakness w/ neglect and R gaze on arrival to ED. CTH w/ hyperdense R MCA w/ w/ cervical R ICA occlusion and ASPECTS 8. Received IV tPA 07/03/2020 at 1356 and sent to IR.  Stroke:   R MCA infarct due to right ICA and MCA occlusion s/p tPA and IR w/ TICI3  revascularization, embolic, secondary to unknown source vs possible dissection    Code Stroke CT head No acute hemorrhage. Suspect R MCA thrombus w/ acute infarct. ASPECTS 8.    CTA head & neck cervical R ICA occlusion 2cm above origin w/ reconstitution and subsequent occlusion proximal R M1 extending into bifurcation   Cerebral angio / IR extracranial R ICA/terminus, R MCA occlusion w/ TICI3 revascularization. Possible R ICA dissection  Repeat CT head dense contrast staining posterior R lentiform (no hemorrhage). New/increased edema R basal ganglia, R insula, anterior temporal lobe w/ mild mass effect R lateral ventricle. 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  2D Echo EF 60-65%  TEE No SOE, neg bubble  TG 823, direct LDL 151   HgbA1c - 5.6  UDS - negative  Hypercoagulable labs only ACL IgM 16 slightly elevated o/w negative VTE prophylaxis - Lovenox 40 mg sq daily   aspirin 81 mg daily prior to admission, now on aspirin 81 and the Plavix 75 DAPT for 3 weeks and then Plavix alone.  Therapy recommendations:  CIR   Disposition:  CIR   Seizure  Witnessed at stroke onset  Seizure in CT  Treated w/ ativan  Loaded w/ Keppra 1500  Now on Keppra 500 q 12h  EEG - 10/19 - Continuousslow, generalized suggestive of severe diffuse encephalopathy, nonspecific etiology.No seizures or epileptiform discharges were seen throughout the recording.  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  Since then, he has quit all of them.  Acute Respiratory Failure, resolved  Secondary to  stroke  Intubated for IR, left intubated post IR per anesthesia  Extubated 10/19 w/o difficulty  Likely Aspiration PNA Leukocytosis  WBC 12.3 (afebrile past 24h)  CXR BLL atx / infiltrate   Repeat CXR LLL PNA   UA neg  Discussed w/ pharmacy  cefdimir 300 bid 10/26>>     plan 5-7 days tx  Hyperlipidemia  Home meds:  No statin  TG 823->479, direct LDL 151, goal < 70  On lipitor 80    Continue statin at discharge  Dysphagia    Secondary to stroke  Has a swallow after modified barium study  Dys 1 with nectar thick liquid  Speech on board  On IVF @ 50   Other Stroke Risk Factors  Obesity, Body mass index is 34.17 kg/m., recommend weight loss, diet and exercise as appropriate   Chronic Back Pain  On lidocaine and flector patches    DISCHARGE EXAM Blood pressure 107/69, pulse 78, temperature 98.3 F (36.8 C), temperature source Oral, resp. rate 18, height 5\' 4"  (1.626 m), weight 90.3 kg, SpO2 100 %. General -mildly obese young African-American male, not in acute distress.  Ophthalmologic - fundi not visualized due to noncooperation.  Cardiovascular - Regular rhythm and rate.  Neuro stable -  awake alert eyes open. Oriented to place, time and age. Paucity of speech, able to name 3/3 and  Mild dysarthria. Following simple commands. Visual field full, able to have bilateral gaze. Left facial droop, tongue protrusion to the left. Left UE proximal 3/5, 3+/5 bicep, 2/5 tricep, 2/5 finger movement. LLE 4+/5 proximal and distal. RUE and RLE 5/5. Sensation subjectively symmetrical. RUE FTN intact. Gait not tested.   Discharge Diet  Dysphagia 3 nectar thick liquids  DISCHARGE PLAN  Disposition:  Transfer to [facility] for ongoing PT, OT and ST  aspirin 81 mg daily and clopidogrel 75 mg daily for secondary stroke prevention for 3 weeks then plavix alone.  Recommend ongoing stroke risk factor control by Primary Care Physician at time of  discharge from inpatient rehabilitation.  Follow-up PCP Pcp, No in 2 weeks following discharge from rehab.  Follow-up in [facility] in 4 weeks following discharge from rehab, office to schedule an appointment.   35 minutes were spent preparing discharge.  Annie Main, MSN, APRN, ANVP-BC, AGPCNP-BC Advanced Practice Stroke Nurse [facility] See Amion for Schedule & Pager information 07/12/2020 2:56 PM  I have personally obtained history,examined this patient, reviewed notes, independently viewed imaging studies, participated in medical decision making and plan of care.ROS completed by me personally and pertinent positives fully documented  I have made any additions or clarifications directly to the above note. Agree with note above   Delia Heady, MD Medical Director [facility] Pager: [phone] 07/12/2020 3:00 PM

## 2020-07-12 NOTE — Plan of Care (Signed)
  Problem: Education: Goal: Knowledge of General Education information will improve Description: Including pain rating scale, medication(s)/side effects and non-pharmacologic comfort measures Outcome: Progressing   Problem: Nutrition: Goal: Risk of aspiration will decrease Outcome: Progressing   Problem: Ischemic Stroke/TIA Tissue Perfusion: Goal: Complications of ischemic stroke/TIA will be minimized Outcome: Progressing   Problem: Self-Care: Goal: Verbalization of feelings and concerns over difficulty with self-care will improve Outcome: Progressing   Problem: Skin Integrity: Goal: Risk for impaired skin integrity will decrease Outcome: Progressing   Problem: Pain Managment: Goal: General experience of comfort will improve Outcome: Progressing

## 2020-07-12 NOTE — Progress Notes (Signed)
Inpatient Rehabilitation Medication Review by a Pharmacist  A complete drug regimen review was completed for this patient to identify any potential clinically significant medication issues.  Clinically significant medication issues were identified:  no   Type of Medication Issue Identified Description of Issue Urgent (address now) Non-Urgent (address on AM team rounds) Plan Plan Accepted by Provider? (Yes / No / Pending AM Rounds)  Drug Interaction(s) (clinically significant)       Duplicate Therapy  2 Lovenox orders Non-urgent D/C'ed one order   Allergy       No Medication Administration End Date       Incorrect Dose       Additional Drug Therapy Needed       Other  Omnicef 300mg  PO BID x 14 days per discharge progress notes (patient received 3 doses prior to transfer)  Plan for ASA + Plavix for 3 weeks, then Plavix alone (ASA given 10/19, Plavix added 10/20) Non-urgent Notify provider via chat to add end date, and recommend 5-7 days only if for aspiration PNA  Notify provider to add stop date for ASA (06/24/20)     For non-urgent medication issues to be resolved on team rounds tomorrow morning a [facility] Secure Chat Handoff was sent to:  Pam Love  Time spent performing this drug regimen review (minutes):  15 min   Charmaine Placido D. 10-23-1983, PharmD, BCPS, BCCCP 07/12/2020, 4:13 PM

## 2020-07-12 NOTE — Progress Notes (Signed)
Mark Fennel, MD  Physician  Physical Medicine and Rehabilitation  Consult Note      Signed  Date of Service:  07/05/2020  8:51 AM      Related encounter: ED to Hosp-Admission (Discharged) from 07/03/2020 in [facility]      Signed      Expand All Collapse All  Show:Clear all [x] Manual[x] Template[] Copied  Added by: [x] Love, Evlyn Kanner, PA-C[x] Mark Fennel, MD  [] Hover for details          Physical Medicine and Rehabilitation Consult     Reason for Consult: Stroke with functional deficits.  Referring Physician: Dr. Roda Shutters   HPI: Mark Oneal is a 34 y.o. male with history of R-SCA infarct with residual left limp otherwise in relatively good health. He was admitted on 07/03/2020 with dysarthria, fall and "jerking" activity of BUE/BLE.  History taken from chart review, PT, and patient due to dysarthria and language. On evaluation by EMS, he was found to have right gaze deviation and left sided hemiparesis. UDS negative. CT head showed hyperdense R-MCA sign and CTA head/neck showed occlusion of R-ICA 2 cm above origin with reconstitution by PCA and subsequent occlusion of proximal right M1 MCA. He underwent cerebral angiogram with complete revascularization of occluded R-ICA and ICA terminus, R-MCA and R-ACA--question proximal right ICA dissection.  Follow up CT head showed left BG/caudate head infarct with contrast stain (? Bleed)  and mild cerebral edema. He was loaded with Keppra and to continue until clinic follow-up.  He was started on hypertonic saline.  EEG performed, showing severe diffuse encephalopathy, negative for seizures He tolerated extubation on 07/04/2020 and n.p.o. recommended due to lethargy as well as signs of as therapy session.  Piration. Stroke work up underway with coagulopathy panel/TEE pending.  He was started on aspirin and Plavix.  Patient with resultant higher level cognitive deficits, bouts of lethargy, left hemiparesis with left  inattention, proprioceptive deficits and lack of awareness of deficits.  However, per therapies, significant progress since previous therapy session.  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  He moved to [place] about 5 months ago and works in [employer].  Does have a relative in [place]. Wife stays at home with autistic child and is limited english speaking. He was independent PTA and has had decline in function status and CIR recommended for follow up therapy.    Review of Systems  Constitutional: Negative for chills and fever.  HENT: Negative for hearing loss and tinnitus.   Eyes: Negative for blurred vision and double vision.  Respiratory: Negative for cough and shortness of breath.   Cardiovascular: Negative for chest pain and palpitations.  Gastrointestinal: Negative for constipation, heartburn and nausea.  Genitourinary: Negative for dysuria, frequency and urgency.  Musculoskeletal: Negative for back pain, myalgias and neck pain.  Skin: Negative for itching and rash.  Neurological: Positive for speech change, focal weakness, weakness and headaches. Negative for sensory change.  Psychiatric/Behavioral: The patient is not nervous/anxious and does not have insomnia.   All other systems reviewed and are negative.         Past Medical History:  Diagnosis Date  . History of stroke involving cerebellum 05/2019           Past Surgical History:  Procedure Laterality Date  . IR ANGIO INTRA EXTRACRAN SEL COM CAROTID INNOMINATE UNI L MOD SED   07/03/2020  . IR CT HEAD LTD   07/03/2020  . IR CT HEAD LTD   07/03/2020  .  IR PERCUTANEOUS ART THROMBECTOMY/INFUSION INTRACRANIAL INC DIAG ANGIO   07/03/2020  . IR US GUIDE VASC ACCESS RIGHT   07/03/2020  . RADIOLOGY WITH ANESTHESIA N/A 07/03/2020    Procedure: IR WITH ANESTHESIA;  Surgeon: Radiologist, Medication, MD;  Location: [facility] OR;  Service: Radiology;  Laterality: N/A;           Family History  Problem  Relation Age of Onset  . Hypertension Mother    . Hypertension Father        Social History:  Married. Independent and working PTA. He used to smoke 10 cigarettes a day till a year ago--smoked for 12 years. He drinks 3 20 ounce cans of beer daily.      Allergies: No Known Allergies            Medications Prior to Admission  Medication Sig Dispense Refill  . aspirin EC 81 MG tablet Take 81 mg by mouth daily. Swallow whole.          Home: Home Living Family/patient expects to be discharged to:: Private residence Living Arrangements: Spouse/significant other, Children (6 yo dtr, 2 yo son) Available Help at Discharge: Family, Available PRN/intermittently Type of Home: House Home Layout: One level Bathroom Shower/Tub: Engineer, manufacturing systems: Standard  Lives With: Spouse, Son, Daughter (son has autism)  Functional History: Prior Function Level of Independence: Independent Comments: works in [employer]; reports has a 53 y/o daughter and 2 y/o son Functional Status:  Mobility: Bed Mobility Overal bed mobility: Needs Assistance Bed Mobility: Rolling, Sidelying to Sit Rolling: Mod assist Sidelying to sit: Mod assist General bed mobility comments: max directional cues, max for L UE movement, pt able to bring LEs off EOB, modA for trunk elevation Transfers Overall transfer level: Needs assistance Equipment used: 2 person hand held assist Transfers: Sit to/from Stand Sit to Stand: Mod assist, +2 physical assistance General transfer comment: modAx2, L knee blocked, posterior hip support to achieve full upright posture, side stepped to [facility], maxA to to advance L LE to the R, L knee blocked as L knee buckles with advancement of R LE Ambulation/Gait General Gait Details: unable at this time   ADL: ADL Overall ADL's : Needs assistance/impaired Eating/Feeding: NPO Grooming: Moderate assistance, Sitting Upper Body Bathing: Moderate assistance, Sitting Lower Body Bathing:  Maximal assistance, +2 for physical assistance, +2 for safety/equipment, Sit to/from stand Upper Body Dressing : Maximal assistance, Sitting Lower Body Dressing: Maximal assistance, +2 for physical assistance, +2 for safety/equipment, Sit to/from stand Lower Body Dressing Details (indicate cue type and reason): attempts to don socks at EOB but ultimately requiring assist including assist for sitting balance and to support LEs in modified figure 4 Toileting- Clothing Manipulation and Hygiene: Total assistance, +2 for physical assistance, +2 for safety/equipment, Sit to/from stand Functional mobility during ADLs: Maximal assistance, +2 for physical assistance, +2 for safety/equipment (sit<>Stand and side steps along EOB)   Cognition: Cognition Overall Cognitive Status: Impaired/Different from baseline Arousal/Alertness: Lethargic Orientation Level: Oriented X4 Attention: Sustained Sustained Attention: Impaired Sustained Attention Impairment: Functional basic Awareness: Impaired Awareness Impairment: Emergent impairment, Intellectual impairment Problem Solving: Impaired Problem Solving Impairment: Functional basic Safety/Judgment: Impaired Cognition Arousal/Alertness: Awake/alert (sleepy but responsive and participatory) Behavior During Therapy: Flat affect Overall Cognitive Status: Impaired/Different from baseline Area of Impairment: Safety/judgement, Problem solving Safety/Judgement: Decreased awareness of deficits (L neglect) Problem Solving: Slow processing, Difficulty sequencing, Requires verbal cues, Requires tactile cues General Comments: pt difficulty processing due to L neglect and L weakness/impaired proprioceptoin  Blood pressure 129/74, pulse 68, temperature 98.8 F (37.1 C), temperature source Axillary, resp. rate (!) 21, height 5\' 4"  (1.626 m), weight 90.3 kg, SpO2 96 %. Physical Exam Vitals and nursing note reviewed. Exam conducted with a chaperone present.    Constitutional:      General: He is not in acute distress.    Appearance: He is well-developed. He is obese.     Comments: Sleeping but easily aroused--reporting HA.   HENT:     Head: Normocephalic and atraumatic.     Right Ear: External ear normal.     Left Ear: External ear normal.     Nose: Nose normal.  Eyes:     General:        Right eye: No discharge.        Left eye: No discharge.     Extraocular Movements: Extraocular movements intact.  Cardiovascular:     Rate and Rhythm: Regular rhythm. Tachycardia present.  Pulmonary:     Effort: Pulmonary effort is normal. No respiratory distress.     Breath sounds: No stridor.  Abdominal:     General: Abdomen is flat. Bowel sounds are normal. There is no distension.  Musculoskeletal:        General: Swelling (LUE) present. No tenderness.     Cervical back: Normal range of motion and neck supple.  Skin:    General: Skin is warm and dry.  Neurological:     Mental Status: He is alert and oriented to person, place, and time.     Comments: Alert and oriented with cues Moderate to severe dysarthria Left inattention Motor: LUE/LE: 5/5 proximal distally RUE:?  Trace elbow extension RLE: 3-/5 proximal distal with apraxia  Psychiatric:        Attention and Perception: Attention normal.        Speech: Speech is slurred.        Behavior: Behavior is slowed.        Lab Results Last 24 Hours       Results for orders placed or performed during the hospital encounter of 07/03/20 (from the past 24 hour(s))  Basic metabolic panel     Status: Abnormal    Collection Time: 07/05/20  3:23 AM  Result Value Ref Range    Sodium 140 135 - 145 mmol/L    Potassium 3.8 3.5 - 5.1 mmol/L    Chloride 106 98 - 111 mmol/L    CO2 25 22 - 32 mmol/L    Glucose, Bld 97 70 - 99 mg/dL    BUN 14 6 - 20 mg/dL    Creatinine, Ser 1.61 0.61 - 1.24 mg/dL    Calcium 8.5 (L) 8.9 - 10.3 mg/dL    GFR, Estimated >09 >60 mL/min    Anion gap 9 5 - 15  CBC      Status: Abnormal    Collection Time: 07/05/20  3:23 AM  Result Value Ref Range    WBC 9.2 4.0 - 10.5 K/uL    RBC 4.01 (L) 4.22 - 5.81 MIL/uL    Hemoglobin 12.6 (L) 13.0 - 17.0 g/dL    HCT 45.4 (L) 39 - 52 %    MCV 92.8 80.0 - 100.0 fL    MCH 31.4 26.0 - 34.0 pg    MCHC 33.9 30.0 - 36.0 g/dL    RDW 09.8 11.9 - 14.7 %    Platelets 162 150 - 400 K/uL    nRBC 0.0 0.0 - 0.2 %  Imaging Results (Last 48 hours)  CT HEAD WO CONTRAST   Result Date: 07/03/2020 CLINICAL DATA:  34 year old male code stroke presentation left side weakness this afternoon positive for right ICA occlusion in the neck, right M1 occlusion. Initial ASPECTS 8. Treated with endovascular revascularization 1500 hours today. EXAM: CT HEAD WITHOUT CONTRAST TECHNIQUE: Contiguous axial images were obtained from the base of the skull through the vertex without intravenous contrast. COMPARISON:  Presentation head CT 1350 hours. FINDINGS: Brain: Stable chronic appearing cerebellar infarcts, including a relatively large right SCA territory infarct. Contrast staining in the posterior lentiform nuclei. Superimposed cytotoxic edema in the right caudate and anterior lentiform, new from the presentation head CT. Mild associated mass effect on the right lateral ventricle, especially the frontal horn. Similar cytotoxic edema at the right insula and external capsule, some of which was present initially. Cytotoxic edema also again suspected at the anterior temporal tip. No acute intracranial hemorrhage identified. ASPECTS MCA segments 2 through 6 appear to remain normal. Contralateral left hemisphere gray-white matter differentiation is stable. No midline shift. Normal basilar cisterns. Vascular: Small volume residual intravascular contrast. Skull: Negative. Sinuses/Orbits: Visualized paranasal sinuses and mastoids are stable and well pneumatized. Other: No acute orbit or scalp soft tissue finding. Intubated. Small volume retained secretions in the  visible pharynx. IMPRESSION: 1. No acute intracranial hemorrhage identified. Dense contrast staining of the posterior right lentiform, where early cytotoxic edema was demonstrated on the presentation scan. 2. New and/or increased cytotoxic edema in the remaining right basal ganglia, right insula, anterior temporal lobe. Mild mass effect on the right lateral ventricle with no midline shift. 3. Stable right greater than left cerebellar infarcts, likely chronic. Electronically Signed   By: Odessa Fleming M.D.   On: 07/03/2020 23:48    MR BRAIN WO CONTRAST   Addendum Date: 07/04/2020   ADDENDUM REPORT: 07/04/2020 14:19 ADDENDUM: These results were called by telephone at the time of interpretation on 07/04/2020 at 2:12 pm to provider Dr. Roda Shutters , who verbally acknowledged these results. Electronically Signed   By: Stana Bunting M.D.   On: 07/04/2020 14:19    Result Date: 07/04/2020 CLINICAL DATA:  Stroke, follow-up. EXAM: MRI HEAD WITHOUT CONTRAST TECHNIQUE: Multiplanar, multiecho pulse sequences of the brain and surrounding structures were obtained without intravenous contrast. COMPARISON:  07/03/2020 noncontrast head CT and CTA head and neck. Later same day 07/03/2020 noncontrast head CT. FINDINGS: Brain: Multifocal restricted diffusion involving the right frontoparietal, occipitotemporal lobes, right insula and right basal ganglia. Remote bilateral cerebellar and right midbrain insults. Right lentiform SWI signal dropout correlates with contrast seen on prior CT. Partial effacement the right lateral ventricle. No midline shift, ventriculomegaly or extra-axial fluid collection. No mass lesion. Vascular: Please see recent CTA. Skull and upper cervical spine: Normal marrow signal. Sinuses/Orbits: Normal orbits. Mild maxillary sinus mucosal thickening with layering ethmoid and sphenoid secretions. Clear mastoid air cells. Other: None. IMPRESSION: Multifocal infarcts involving the right cerebrum and right basal ganglia.  Redemonstration of right lentiform contrast. Remote bilateral cerebellar and right midbrain insults. Electronically Signed: By: Stana Bunting M.D. On: 07/04/2020 14:04    IR CT Head Ltd   Result Date: 07/05/2020 INDICATION: Acute onset of seizures. Left-sided neglect and left-sided weakness with right gaze deviation. 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 EXAM: 1. EMERGENT LARGE VESSEL OCCLUSION THROMBOLYSIS (anterior CIRCULATION) COMPARISON:  CT angiogram of the head and neck of July 03, 2020. MEDICATIONS: Ancef 2 g IV antibiotic was administered within 1  hour of the procedure. ANESTHESIA/SEDATION: General anesthesia CONTRAST:  Omnipaque 300 approximately 120 mL FLUOROSCOPY TIME:  Fluoroscopy Time: 20 minutes 20 seconds (1427 mGy). COMPLICATIONS: None immediate. TECHNIQUE: Following a full explanation of the procedure along with the potential associated complications, an informed witnessed consent was obtained by the interpreter from his live-in girlfriend. The risks of intracranial hemorrhage of 10%, worsening neurological deficit, ventilator dependency, death and inability to revascularize were all reviewed in detail with the patient's girlfriend. The patient was then put under general anesthesia by the Department of Anesthesiology at [facility]. The right groin was prepped and draped in the usual sterile fashion. Thereafter using modified Seldinger technique, transfemoral access into the right common femoral artery was obtained without difficulty. Over a 0.035 inch guidewire an 8 French 25 cm Pinnacle sheath was inserted. Through this, and also over a 0.035 inch guidewire a 5 [place] select JB 1 catheter inside of an 087 balloon guide catheter was advanced to the aortic arch region and selectively positioned in the right common carotid artery. The guidewire, and select diagnostic catheter were removed. Good aspiration obtained from the hub of the balloon  guide catheter in the distal right common carotid artery. An arteriogram was then performed centered intracranially and extracranially. FINDINGS: The right common carotid arteriogram demonstrates the right external carotid artery and its major branches to be widely patent. The right internal carotid artery at the bulb and distal to the bulb demonstrates a tapered angiographic appearance in its proximal 1/3 with complete occlusion with no filling defects and flap noted along the posterior wall proximally. No distal reconstitution is noted intracranially of the right ICA and cavernous and supraclinoid segments. PROCEDURE: Over a 0.014 inch standard Synchro micro guidewire with a J configuration, a combination of an 021 150 cm microcatheter inside of an 071 Zoom aspiration catheter was advanced without difficulty to the cavernous segment. The balloon guide catheter was now also advanced without any resistance to the distal cervical right ICA. A gentle control arteriogram performed through the balloon guide demonstrated complete occlusion of the right internal carotid artery at the distal cavernous segment. No antegrade flow was noticeable from the proximal right ICA. The micro guidewire was then gently advanced and manipulated with a torque device into the right middle cerebral artery proximally and then into the superior division followed by the microcatheter. Guidewire was removed. Slow aspiration of blood was obtained at the microcatheter hub. A very gentle contrast injection through the microcatheter demonstrated the tip of the microcatheter to be safely positioned with slow flow antegrade clearance of contrast. A 4 mm x 40 mm Solitaire retrieval device was then advanced to the distal end of the microcatheter. The Zoom aspiration catheter was now advanced into the distal right M1 segment. The O ring on the delivery microcatheter was then loosened. The retrieval device was then deployed in the usual fashion without  difficulty. The wire was now retrieved more proximally in the Zoom aspiration catheter. Thereafter, constant aspiration was applied at the hub of the Zoom aspiration catheter, and with 20 mL syringe at the hub of the balloon guide catheter for approximately 3 minutes. Thereafter, slow retrieval of the retrieval device, the microcatheter and the aspiration catheter was performed. Copious amounts of clot were seen in the canister of the aspiration device. Also noted was a piece of clot in the interstices of the retrieval device, and also in the [place]. Following reversal of flow arrest in the right internal carotid artery, with free  aspiration of blood obtained at the hub, a gentle control arteriogram performed demonstrates free flow in the right internal carotid artery distally in the petrous, cavernous and supraclinoid segments. Noted was opacification of the right middle cerebral artery and the right anterior cerebral artery distributions. Significant spasm was noted in the proximal right middle cerebral artery, and of the superior division of the right middle cerebral artery. These responded to 3 aliquots of 25 mcg of nitroglycerin intra-arterially. At this time, also noted was contrast overlying the right basal ganglia region on angiography. CT of the brain on the table demonstrated dense focal hyper density in the putamen and less prominent in the right caudate head representing contrast with blood. No change was seen in the patient's hemodynamic status at this time. A control arteriogram performed through the balloon guide in the right internal carotid artery continued to demonstrate excellent flow extra cranially and intracranially with now spasm seen in the supraclinoid right ICA, and persistent moderate spasm in the proximal right M1 segment in the superior divisions. The patient was given 5 mg of intra-arterial verapamil slowly. A repeat arteriogram performed approximately 5 minutes later demonstrated  modest improvement in the spasm in the right middle cerebral artery. Repeat arteriograms at 20 minutes following the initial revascularization demonstrated significant improvement in the right middle cerebral artery and the right internal carotid artery vasospasm. There continued to be a TICI 3 revascularization. An exchange 0.014 inch Synchro micro guidewire was then advanced to the horizontal petrous segment. An arteriogram was then performed with the balloon guide catheter in the distal right common carotid artery. This continued to demonstrate excellent flow extra cranially and intracranially. No visible irregularity or an intimal flap was visible. Intracranially improvement is noted in the supraclinoid right ICA. No change was seen in the small focal outpouching in the right posterior communicating artery region suspicious for an aneurysm. A final control arteriogram performed through the right common carotid artery demonstrated patency of the right internal carotid artery extra cranially and intracranially without evidence of intraluminal filling defects or spasm. The right middle cerebral artery continued to maintain a TICI 3 revascularization. The right anterior cerebral artery remained widely patent. The balloon guide was removed. The 8 French Pinnacle sheath was then replaced with an 8 French Angio-Seal closure device for hemostasis in the right groin. Distal pulses remained palpable in both feet unchanged. Patient was left intubated on account of his medical condition. CT of the brain performed continued to demonstrate no change in the hyperdensity in the right basal ganglia region. Patient was then transferred to neuro ICU for post revascularization management. IMPRESSION: Status post endovascular complete revascularization of the right internal carotid artery terminus, the right middle cerebral artery, the right anterior cerebral artery, and the extracranial right internal carotid artery, with 1 pass  with the 4 mm x 40 mm Solitaire retrieval device, with aspiration, and proximal flow arrest achieving a TICI 3 revascularization. 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 PLAN: Follow-up in the clinic 4 to 6 weeks post discharge. Electronically Signed   By: Julieanne Cotton M.D.   On: 07/04/2020 11:00    IR CT Head Ltd   Result Date: 07/05/2020 INDICATION: Acute onset of seizures. Left-sided neglect and left-sided weakness with right gaze deviation. 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 EXAM: 1. EMERGENT LARGE VESSEL OCCLUSION THROMBOLYSIS (anterior CIRCULATION) COMPARISON:  CT angiogram of the head and  neck of July 03, 2020. MEDICATIONS: Ancef 2 g IV antibiotic was administered within 1 hour of the procedure. ANESTHESIA/SEDATION: General anesthesia CONTRAST:  Omnipaque 300 approximately 120 mL FLUOROSCOPY TIME:  Fluoroscopy Time: 20 minutes 20 seconds (1427 mGy). COMPLICATIONS: None immediate. TECHNIQUE: Following a full explanation of the procedure along with the potential associated complications, an informed witnessed consent was obtained by the interpreter from his live-in girlfriend. The risks of intracranial hemorrhage of 10%, worsening neurological deficit, ventilator dependency, death and inability to revascularize were all reviewed in detail with the patient's girlfriend. The patient was then put under general anesthesia by the Department of Anesthesiology at [facility]. The right groin was prepped and draped in the usual sterile fashion. Thereafter using modified Seldinger technique, transfemoral access into the right common femoral artery was obtained without difficulty. Over a 0.035 inch guidewire an 8 French 25 cm Pinnacle sheath was inserted. Through this, and also over a 0.035 inch guidewire a 5 [place] select JB 1 catheter inside of an 087 balloon guide catheter was  advanced to the aortic arch region and selectively positioned in the right common carotid artery. The guidewire, and select diagnostic catheter were removed. Good aspiration obtained from the hub of the balloon guide catheter in the distal right common carotid artery. An arteriogram was then performed centered intracranially and extracranially. FINDINGS: The right common carotid arteriogram demonstrates the right external carotid artery and its major branches to be widely patent. The right internal carotid artery at the bulb and distal to the bulb demonstrates a tapered angiographic appearance in its proximal 1/3 with complete occlusion with no filling defects and flap noted along the posterior wall proximally. No distal reconstitution is noted intracranially of the right ICA and cavernous and supraclinoid segments. PROCEDURE: Over a 0.014 inch standard Synchro micro guidewire with a J configuration, a combination of an 021 150 cm microcatheter inside of an 071 Zoom aspiration catheter was advanced without difficulty to the cavernous segment. The balloon guide catheter was now also advanced without any resistance to the distal cervical right ICA. A gentle control arteriogram performed through the balloon guide demonstrated complete occlusion of the right internal carotid artery at the distal cavernous segment. No antegrade flow was noticeable from the proximal right ICA. The micro guidewire was then gently advanced and manipulated with a torque device into the right middle cerebral artery proximally and then into the superior division followed by the microcatheter. Guidewire was removed. Slow aspiration of blood was obtained at the microcatheter hub. A very gentle contrast injection through the microcatheter demonstrated the tip of the microcatheter to be safely positioned with slow flow antegrade clearance of contrast. A 4 mm x 40 mm Solitaire retrieval device was then advanced to the distal end of the  microcatheter. The Zoom aspiration catheter was now advanced into the distal right M1 segment. The O ring on the delivery microcatheter was then loosened. The retrieval device was then deployed in the usual fashion without difficulty. The wire was now retrieved more proximally in the Zoom aspiration catheter. Thereafter, constant aspiration was applied at the hub of the Zoom aspiration catheter, and with 20 mL syringe at the hub of the balloon guide catheter for approximately 3 minutes. Thereafter, slow retrieval of the retrieval device, the microcatheter and the aspiration catheter was performed. Copious amounts of clot were seen in the canister of the aspiration device. Also noted was a piece of clot in the interstices of the retrieval device, and also in the  Tuohy [place]. Following reversal of flow arrest in the right internal carotid artery, with free aspiration of blood obtained at the hub, a gentle control arteriogram performed demonstrates free flow in the right internal carotid artery distally in the petrous, cavernous and supraclinoid segments. Noted was opacification of the right middle cerebral artery and the right anterior cerebral artery distributions. Significant spasm was noted in the proximal right middle cerebral artery, and of the superior division of the right middle cerebral artery. These responded to 3 aliquots of 25 mcg of nitroglycerin intra-arterially. At this time, also noted was contrast overlying the right basal ganglia region on angiography. CT of the brain on the table demonstrated dense focal hyper density in the putamen and less prominent in the right caudate head representing contrast with blood. No change was seen in the patient's hemodynamic status at this time. A control arteriogram performed through the balloon guide in the right internal carotid artery continued to demonstrate excellent flow extra cranially and intracranially with now spasm seen in the supraclinoid right ICA, and  persistent moderate spasm in the proximal right M1 segment in the superior divisions. The patient was given 5 mg of intra-arterial verapamil slowly. A repeat arteriogram performed approximately 5 minutes later demonstrated modest improvement in the spasm in the right middle cerebral artery. Repeat arteriograms at 20 minutes following the initial revascularization demonstrated significant improvement in the right middle cerebral artery and the right internal carotid artery vasospasm. There continued to be a TICI 3 revascularization. An exchange 0.014 inch Synchro micro guidewire was then advanced to the horizontal petrous segment. An arteriogram was then performed with the balloon guide catheter in the distal right common carotid artery. This continued to demonstrate excellent flow extra cranially and intracranially. No visible irregularity or an intimal flap was visible. Intracranially improvement is noted in the supraclinoid right ICA. No change was seen in the small focal outpouching in the right posterior communicating artery region suspicious for an aneurysm. A final control arteriogram performed through the right common carotid artery demonstrated patency of the right internal carotid artery extra cranially and intracranially without evidence of intraluminal filling defects or spasm. The right middle cerebral artery continued to maintain a TICI 3 revascularization. The right anterior cerebral artery remained widely patent. The balloon guide was removed. The 8 French Pinnacle sheath was then replaced with an 8 French Angio-Seal closure device for hemostasis in the right groin. Distal pulses remained palpable in both feet unchanged. Patient was left intubated on account of his medical condition. CT of the brain performed continued to demonstrate no change in the hyperdensity in the right basal ganglia region. Patient was then transferred to neuro ICU for post revascularization management. IMPRESSION: Status post  endovascular complete revascularization of the right internal carotid artery terminus, the right middle cerebral artery, the right anterior cerebral artery, and the extracranial right internal carotid artery, with 1 pass with the 4 mm x 40 mm Solitaire retrieval device, with aspiration, and proximal flow arrest achieving a TICI 3 revascularization. 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 PLAN: Follow-up in the clinic 4 to 6 weeks post discharge. Electronically Signed   By: Julieanne Cotton M.D.   On: 07/04/2020 11:00    IR US Guide Vasc Access Right   Result Date: 07/05/2020 INDICATION: Acute onset of seizures. Left-sided neglect and left-sided weakness with right gaze deviation. 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  EXAM: 1. EMERGENT LARGE VESSEL OCCLUSION THROMBOLYSIS (anterior CIRCULATION) COMPARISON:  CT angiogram of the head and neck of July 03, 2020. MEDICATIONS: Ancef 2 g IV antibiotic was administered within 1 hour of the procedure. ANESTHESIA/SEDATION: General anesthesia CONTRAST:  Omnipaque 300 approximately 120 mL FLUOROSCOPY TIME:  Fluoroscopy Time: 20 minutes 20 seconds (1427 mGy). COMPLICATIONS: None immediate. TECHNIQUE: Following a full explanation of the procedure along with the potential associated complications, an informed witnessed consent was obtained by the interpreter from his live-in girlfriend. The risks of intracranial hemorrhage of 10%, worsening neurological deficit, ventilator dependency, death and inability to revascularize were all reviewed in detail with the patient's girlfriend. The patient was then put under general anesthesia by the Department of Anesthesiology at [facility]. The right groin was prepped and draped in the usual sterile fashion. Thereafter using modified Seldinger technique, transfemoral access into the right common femoral artery  was obtained without difficulty. Over a 0.035 inch guidewire an 8 French 25 cm Pinnacle sheath was inserted. Through this, and also over a 0.035 inch guidewire a 5 [place] select JB 1 catheter inside of an 087 balloon guide catheter was advanced to the aortic arch region and selectively positioned in the right common carotid artery. The guidewire, and select diagnostic catheter were removed. Good aspiration obtained from the hub of the balloon guide catheter in the distal right common carotid artery. An arteriogram was then performed centered intracranially and extracranially. FINDINGS: The right common carotid arteriogram demonstrates the right external carotid artery and its major branches to be widely patent. The right internal carotid artery at the bulb and distal to the bulb demonstrates a tapered angiographic appearance in its proximal 1/3 with complete occlusion with no filling defects and flap noted along the posterior wall proximally. No distal reconstitution is noted intracranially of the right ICA and cavernous and supraclinoid segments. PROCEDURE: Over a 0.014 inch standard Synchro micro guidewire with a J configuration, a combination of an 021 150 cm microcatheter inside of an 071 Zoom aspiration catheter was advanced without difficulty to the cavernous segment. The balloon guide catheter was now also advanced without any resistance to the distal cervical right ICA. A gentle control arteriogram performed through the balloon guide demonstrated complete occlusion of the right internal carotid artery at the distal cavernous segment. No antegrade flow was noticeable from the proximal right ICA. The micro guidewire was then gently advanced and manipulated with a torque device into the right middle cerebral artery proximally and then into the superior division followed by the microcatheter. Guidewire was removed. Slow aspiration of blood was obtained at the microcatheter hub. A very gentle contrast injection  through the microcatheter demonstrated the tip of the microcatheter to be safely positioned with slow flow antegrade clearance of contrast. A 4 mm x 40 mm Solitaire retrieval device was then advanced to the distal end of the microcatheter. The Zoom aspiration catheter was now advanced into the distal right M1 segment. The O ring on the delivery microcatheter was then loosened. The retrieval device was then deployed in the usual fashion without difficulty. The wire was now retrieved more proximally in the Zoom aspiration catheter. Thereafter, constant aspiration was applied at the hub of the Zoom aspiration catheter, and with 20 mL syringe at the hub of the balloon guide catheter for approximately 3 minutes. Thereafter, slow retrieval of the retrieval device, the microcatheter and the aspiration catheter was performed. Copious amounts of clot were seen in the canister of the aspiration device. Also  noted was a piece of clot in the interstices of the retrieval device, and also in the Tuohy [place]. Following reversal of flow arrest in the right internal carotid artery, with free aspiration of blood obtained at the hub, a gentle control arteriogram performed demonstrates free flow in the right internal carotid artery distally in the petrous, cavernous and supraclinoid segments. Noted was opacification of the right middle cerebral artery and the right anterior cerebral artery distributions. Significant spasm was noted in the proximal right middle cerebral artery, and of the superior division of the right middle cerebral artery. These responded to 3 aliquots of 25 mcg of nitroglycerin intra-arterially. At this time, also noted was contrast overlying the right basal ganglia region on angiography. CT of the brain on the table demonstrated dense focal hyper density in the putamen and less prominent in the right caudate head representing contrast with blood. No change was seen in the patient's hemodynamic status at this time.  A control arteriogram performed through the balloon guide in the right internal carotid artery continued to demonstrate excellent flow extra cranially and intracranially with now spasm seen in the supraclinoid right ICA, and persistent moderate spasm in the proximal right M1 segment in the superior divisions. The patient was given 5 mg of intra-arterial verapamil slowly. A repeat arteriogram performed approximately 5 minutes later demonstrated modest improvement in the spasm in the right middle cerebral artery. Repeat arteriograms at 20 minutes following the initial revascularization demonstrated significant improvement in the right middle cerebral artery and the right internal carotid artery vasospasm. There continued to be a TICI 3 revascularization. An exchange 0.014 inch Synchro micro guidewire was then advanced to the horizontal petrous segment. An arteriogram was then performed with the balloon guide catheter in the distal right common carotid artery. This continued to demonstrate excellent flow extra cranially and intracranially. No visible irregularity or an intimal flap was visible. Intracranially improvement is noted in the supraclinoid right ICA. No change was seen in the small focal outpouching in the right posterior communicating artery region suspicious for an aneurysm. A final control arteriogram performed through the right common carotid artery demonstrated patency of the right internal carotid artery extra cranially and intracranially without evidence of intraluminal filling defects or spasm. The right middle cerebral artery continued to maintain a TICI 3 revascularization. The right anterior cerebral artery remained widely patent. The balloon guide was removed. The 8 French Pinnacle sheath was then replaced with an 8 French Angio-Seal closure device for hemostasis in the right groin. Distal pulses remained palpable in both feet unchanged. Patient was left intubated on account of his medical  condition. CT of the brain performed continued to demonstrate no change in the hyperdensity in the right basal ganglia region. Patient was then transferred to neuro ICU for post revascularization management. IMPRESSION: Status post endovascular complete revascularization of the right internal carotid artery terminus, the right middle cerebral artery, the right anterior cerebral artery, and the extracranial right internal carotid artery, with 1 pass with the 4 mm x 40 mm Solitaire retrieval device, with aspiration, and proximal flow arrest achieving a TICI 3 revascularization. 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 PLAN: Follow-up in the clinic 4 to 6 weeks post discharge. Electronically Signed   By: Julieanne Cotton M.D.   On: 07/04/2020 11:00    Portable Chest xray   Result Date: 07/04/2020 CLINICAL DATA:  Intubation. EXAM: PORTABLE CHEST 1 VIEW COMPARISON:  07/03/2020.  FINDINGS: Endotracheal tube tip 3.7 cm above the carina. Mediastinum hilar structures appear stable. Heart size appears stable. Low lung volumes with bilateral subsegmental atelectasis. No pleural effusion or pneumothorax. IMPRESSION: 1. Endotracheal tube tip 3.7 cm above the carina. 2. Low lung volumes with bilateral subsegmental atelectasis. Electronically Signed   By: Maisie Fushomas  Register   On: 07/04/2020 06:50    Portable Chest x-ray   Result Date: 07/03/2020 CLINICAL DATA:  Respiratory failure. EXAM: PORTABLE CHEST 1 VIEW COMPARISON:  None. FINDINGS: An endotracheal tube is seen with its distal tip approximately 3.2 cm from the carina. There is no evidence of acute infiltrate, pleural effusion or pneumothorax. The heart size and mediastinal contours are within normal limits. The visualized skeletal structures are unremarkable. IMPRESSION: Endotracheal tube positioning, as described above, without evidence of acute or active cardiopulmonary disease.  Electronically Signed   By: Aram Candelahaddeus  Houston M.D.   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 EEG to evaluate for seizure Level of alertness: Awake AEDs during EEG study: LEV Technical aspects: This EEG study was done with scalp electrodes positioned according to the 10-20 International system of electrode placement. Electrical activity was acquired at a sampling rate of 500Hz  and reviewed with a high frequency filter of 70Hz  and a low frequency filter of 1Hz . EEG data were recorded continuously and digitally stored. Description: EEG showed continuous generalized and maximal right temporal region 2-3Hz  with overriding 13-14Hz  generalized beta activity. Hyperventilation and photic stimulation were not performed.   ABNORMALITY -Continuous slow, generalized IMPRESSION: This study is suggestive of severe diffuse encephalopathy, nonspecific etiology. No seizures or epileptiform discharges were seen throughout the recording. Charlsie Questriyanka O Yadav    ECHOCARDIOGRAM COMPLETE   Result Date: 07/04/2020    ECHOCARDIOGRAM REPORT   Patient Name:   Mark PillingKEVIN Buch Date of Exam: 07/04/2020 Medical Rec #:  [id number]    Height:       64.0 in Accession #:    [id number]   Weight:       199.1 lb Date of Birth:  03/13/1986     BSA:          1.953 m Patient Age:    34 years     BP:           141/67 mmHg Patient Gender: M            HR:           91 bpm. Exam Location:  Inpatient Procedure: 2D Echo Indications:   434.91 stroke  History:       Patient has no prior history of Echocardiogram examinations.                Stroke.  Sonographer:   Celene SkeenVijay Shankar RDCS (AE)  Referring      [id number] [facility] Phys:  Sonographer Comments: Restricted mobility. poor patient compliance (unable to remain still at times) IMPRESSIONS  1. Left ventricular ejection fraction, by estimation, is 60 to 65%. The left ventricle has normal function. The left ventricle has no regional wall motion abnormalities. Left ventricular diastolic parameters were normal.  2. Right ventricular systolic function is normal. The  right ventricular size is normal.  3. The mitral valve is grossly normal. No evidence of mitral valve regurgitation.  4. The aortic valve was not well visualized. Aortic valve regurgitation is not visualized. Comparison(s): No prior Echocardiogram. FINDINGS  Left Ventricle: Left ventricular ejection fraction, by estimation, is 60 to 65%. The left ventricle has normal function. The left ventricle has no regional wall motion abnormalities. The left ventricular internal cavity size was normal in size. There is  no left ventricular hypertrophy. Left ventricular diastolic parameters were normal. Right Ventricle: The right ventricular size is normal. Right vetricular wall thickness was not well visualized. Right ventricular systolic function is normal. Left Atrium: Left atrial size was normal in size. Right Atrium: Right atrial size was normal in size. Pericardium: There is no evidence of pericardial effusion. Mitral Valve: The mitral valve is grossly normal. No evidence of mitral valve regurgitation. Tricuspid Valve: The tricuspid valve is grossly normal. Tricuspid valve regurgitation is not demonstrated. Aortic Valve: The aortic valve was not well visualized. Aortic valve regurgitation is not visualized. Pulmonic Valve: The pulmonic valve was grossly normal. Pulmonic valve regurgitation is not visualized. Aorta: The aortic root is normal in size and structure. IAS/Shunts: The interatrial septum was not assessed.  LEFT VENTRICLE PLAX 2D LVIDd:         4.50 cm  Diastology LVIDs:         2.40  cm  LV e' medial:    10.70 cm/s LV PW:         0.90 cm  LV E/e' medial:  10.4 LV IVS:        1.10 cm  LV e' lateral:   15.90 cm/s LVOT diam:     2.30 cm  LV E/e' lateral: 7.0 LV SV:         79 LV SV Index:   40 LVOT Area:     4.15 cm  RIGHT VENTRICLE RV S prime:     15.30 cm/s TAPSE (M-mode): 1.7 cm LEFT ATRIUM             Index LA diam:        3.00 cm 1.54 cm/m LA Vol (A2C):   29.6 ml 15.16 ml/m LA Vol (A4C):   32.9 ml 16.85 ml/m LA Biplane Vol: 31.4 ml 16.08 ml/m  AORTIC VALVE LVOT Vmax:   111.00 cm/s LVOT Vmean:  74.900 cm/s LVOT VTI:    0.189 m  AORTA Ao Root diam: 2.80 cm MITRAL VALVE MV Area (PHT): 5.75 cm     SHUNTS MV Decel Time: 132 msec     Systemic VTI:  0.19 m MV E velocity: 111.00 cm/s  Systemic Diam: 2.30 cm MV A velocity: 67.30 cm/s MV E/A ratio:  1.65 Riley Lam MD Electronically signed by Riley Lam MD Signature Date/Time: 07/04/2020/2:33:40 PM    Final     IR PERCUTANEOUS ART THROMBECTOMY/INFUSION INTRACRANIAL INC DIAG ANGIO   Result Date: 07/05/2020 INDICATION: Acute onset of seizures. Left-sided neglect and left-sided weakness with right gaze deviation. 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 EXAM: 1. EMERGENT LARGE VESSEL OCCLUSION THROMBOLYSIS (anterior CIRCULATION) COMPARISON:  CT angiogram of the head and neck of July 03, 2020. MEDICATIONS: Ancef 2 g IV antibiotic was administered within 1 hour of the procedure. ANESTHESIA/SEDATION: General anesthesia CONTRAST:  Omnipaque 300 approximately 120 mL FLUOROSCOPY TIME:  Fluoroscopy Time: 20 minutes 20 seconds (1427 mGy). COMPLICATIONS: None immediate. TECHNIQUE: Following a full explanation of the procedure along with the  potential associated complications, an informed witnessed consent was obtained by the interpreter from his live-in girlfriend. The risks of intracranial hemorrhage of 10%, worsening neurological deficit, ventilator dependency, death and inability to revascularize  were all reviewed in detail with the patient's girlfriend. The patient was then put under general anesthesia by the Department of Anesthesiology at [facility]. The right groin was prepped and draped in the usual sterile fashion. Thereafter using modified Seldinger technique, transfemoral access into the right common femoral artery was obtained without difficulty. Over a 0.035 inch guidewire an 8 French 25 cm Pinnacle sheath was inserted. Through this, and also over a 0.035 inch guidewire a 5 [place] select JB 1 catheter inside of an 087 balloon guide catheter was advanced to the aortic arch region and selectively positioned in the right common carotid artery. The guidewire, and select diagnostic catheter were removed. Good aspiration obtained from the hub of the balloon guide catheter in the distal right common carotid artery. An arteriogram was then performed centered intracranially and extracranially. FINDINGS: The right common carotid arteriogram demonstrates the right external carotid artery and its major branches to be widely patent. The right internal carotid artery at the bulb and distal to the bulb demonstrates a tapered angiographic appearance in its proximal 1/3 with complete occlusion with no filling defects and flap noted along the posterior wall proximally. No distal reconstitution is noted intracranially of the right ICA and cavernous and supraclinoid segments. PROCEDURE: Over a 0.014 inch standard Synchro micro guidewire with a J configuration, a combination of an 021 150 cm microcatheter inside of an 071 Zoom aspiration catheter was advanced without difficulty to the cavernous segment. The balloon guide catheter was now also advanced without any resistance to the distal cervical right ICA. A gentle control arteriogram performed through the balloon guide demonstrated complete occlusion of the right internal carotid artery at the distal cavernous segment. No antegrade flow was noticeable from  the proximal right ICA. The micro guidewire was then gently advanced and manipulated with a torque device into the right middle cerebral artery proximally and then into the superior division followed by the microcatheter. Guidewire was removed. Slow aspiration of blood was obtained at the microcatheter hub. A very gentle contrast injection through the microcatheter demonstrated the tip of the microcatheter to be safely positioned with slow flow antegrade clearance of contrast. A 4 mm x 40 mm Solitaire retrieval device was then advanced to the distal end of the microcatheter. The Zoom aspiration catheter was now advanced into the distal right M1 segment. The O ring on the delivery microcatheter was then loosened. The retrieval device was then deployed in the usual fashion without difficulty. The wire was now retrieved more proximally in the Zoom aspiration catheter. Thereafter, constant aspiration was applied at the hub of the Zoom aspiration catheter, and with 20 mL syringe at the hub of the balloon guide catheter for approximately 3 minutes. Thereafter, slow retrieval of the retrieval device, the microcatheter and the aspiration catheter was performed. Copious amounts of clot were seen in the canister of the aspiration device. Also noted was a piece of clot in the interstices of the retrieval device, and also in the [employer] [place]. Following reversal of flow arrest in the right internal carotid artery, with free aspiration of blood obtained at the hub, a gentle control arteriogram performed demonstrates free flow in the right internal carotid artery distally in the petrous, cavernous and supraclinoid segments. Noted was opacification of the right middle cerebral artery and  the right anterior cerebral artery distributions. Significant spasm was noted in the proximal right middle cerebral artery, and of the superior division of the right middle cerebral artery. These responded to 3 aliquots of 25 mcg of nitroglycerin  intra-arterially. At this time, also noted was contrast overlying the right basal ganglia region on angiography. CT of the brain on the table demonstrated dense focal hyper density in the putamen and less prominent in the right caudate head representing contrast with blood. No change was seen in the patient's hemodynamic status at this time. A control arteriogram performed through the balloon guide in the right internal carotid artery continued to demonstrate excellent flow extra cranially and intracranially with now spasm seen in the supraclinoid right ICA, and persistent moderate spasm in the proximal right M1 segment in the superior divisions. The patient was given 5 mg of intra-arterial verapamil slowly. A repeat arteriogram performed approximately 5 minutes later demonstrated modest improvement in the spasm in the right middle cerebral artery. Repeat arteriograms at 20 minutes following the initial revascularization demonstrated significant improvement in the right middle cerebral artery and the right internal carotid artery vasospasm. There continued to be a TICI 3 revascularization. An exchange 0.014 inch Synchro micro guidewire was then advanced to the horizontal petrous segment. An arteriogram was then performed with the balloon guide catheter in the distal right common carotid artery. This continued to demonstrate excellent flow extra cranially and intracranially. No visible irregularity or an intimal flap was visible. Intracranially improvement is noted in the supraclinoid right ICA. No change was seen in the small focal outpouching in the right posterior communicating artery region suspicious for an aneurysm. A final control arteriogram performed through the right common carotid artery demonstrated patency of the right internal carotid artery extra cranially and intracranially without evidence of intraluminal filling defects or spasm. The right middle cerebral artery continued to maintain a TICI 3  revascularization. The right anterior cerebral artery remained widely patent. The balloon guide was removed. The 8 French Pinnacle sheath was then replaced with an 8 French Angio-Seal closure device for hemostasis in the right groin. Distal pulses remained palpable in both feet unchanged. Patient was left intubated on account of his medical condition. CT of the brain performed continued to demonstrate no change in the hyperdensity in the right basal ganglia region. Patient was then transferred to neuro ICU for post revascularization management. IMPRESSION: Status post endovascular complete revascularization of the right internal carotid artery terminus, the right middle cerebral artery, the right anterior cerebral artery, and the extracranial right internal carotid artery, with 1 pass with the 4 mm x 40 mm Solitaire retrieval device, with aspiration, and proximal flow arrest achieving a TICI 3 revascularization. 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 PLAN: Follow-up in the clinic 4 to 6 weeks post discharge. Electronically Signed   By: Julieanne Cotton M.D.   On: 07/04/2020 11:00    CT HEAD CODE STROKE WO CONTRAST   Result Date: 07/03/2020 CLINICAL DATA:  Code stroke.  Left-sided weakness EXAM: CT HEAD WITHOUT CONTRAST TECHNIQUE: Contiguous axial images were obtained from the base of the skull through the vertex without intravenous contrast. COMPARISON:  None. FINDINGS: Brain: There is no acute intracranial hemorrhage. There is suspected hypoattenuation and loss of gray-white differentiation involving the anterior right temporal lobe and inferior lentiform nucleus. There is no mass effect. Ventricles and sulci are within normal limits in size and configuration. There are age-indeterminate  but probably chronic right greater than left cerebellar infarcts. Vascular: There is increased density along the right M1 MCA. Skull:  Unremarkable. Sinuses/Orbits: No acute abnormality. Other: Mastoid air cells are clear. ASPECTS [facility] Stroke Program Early CT Score) - Ganglionic level infarction (caudate, lentiform nuclei, internal capsule, insula, M1-M3 cortex): 5 - Supraganglionic infarction (M4-M6 cortex): 3 Total score (0-10 with 10 being normal): 8 IMPRESSION: There is no acute intracranial hemorrhage. Suspected thrombus within the right MCA and acute right MCA territory infarction. ASPECT score is 8. These results were called by telephone at the time of interpretation on 07/03/2020 at 1:54 pm to provider [facility] , who verbally acknowledged these results. Electronically Signed   By: Guadlupe Spanish M.D.   On: 07/03/2020 14:06    VAS [place] LOWER EXTREMITY VENOUS (DVT)   Result Date: 07/04/2020  Lower Venous DVTStudy Indications: Edema.  Risk Factors: Immobility. Performing Technologist: Jannet Askew RCT RDMS  Examination Guidelines: A complete evaluation includes B-mode imaging, spectral Doppler, color Doppler, and power Doppler as needed of all accessible portions of each vessel. Bilateral testing is considered an integral part of a complete examination. Limited examinations for reoccurring indications may be performed as noted. The reflux portion of the exam is performed with the patient in reverse Trendelenburg.  +---------+---------------+---------+-----------+----------+--------------+ RIGHT    CompressibilityPhasicitySpontaneityPropertiesThrombus Aging +---------+---------------+---------+-----------+----------+--------------+ CFV      Full           Yes      Yes                                 +---------+---------------+---------+-----------+----------+--------------+ SFJ      Full                                                        +---------+---------------+---------+-----------+----------+--------------+ FV Prox  Full                                                         +---------+---------------+---------+-----------+----------+--------------+ FV Mid   Full                                                        +---------+---------------+---------+-----------+----------+--------------+ FV DistalFull                                                        +---------+---------------+---------+-----------+----------+--------------+ PFV      Full                                                        +---------+---------------+---------+-----------+----------+--------------+ POP      Full  Yes      Yes                                 +---------+---------------+---------+-----------+----------+--------------+ PTV      Full                                                        +---------+---------------+---------+-----------+----------+--------------+ PERO     Full                                                        +---------+---------------+---------+-----------+----------+--------------+   +---------+---------------+---------+-----------+----------+--------------+ LEFT     CompressibilityPhasicitySpontaneityPropertiesThrombus Aging +---------+---------------+---------+-----------+----------+--------------+ CFV      Full           Yes      Yes                                 +---------+---------------+---------+-----------+----------+--------------+ SFJ      Full                                                        +---------+---------------+---------+-----------+----------+--------------+ FV Prox  Full                                                        +---------+---------------+---------+-----------+----------+--------------+ FV Mid   Full                                                        +---------+---------------+---------+-----------+----------+--------------+ FV DistalFull                                                         +---------+---------------+---------+-----------+----------+--------------+ PFV      Full                                                        +---------+---------------+---------+-----------+----------+--------------+ POP      Full           Yes      Yes                                 +---------+---------------+---------+-----------+----------+--------------+ PTV  Full                                                        +---------+---------------+---------+-----------+----------+--------------+ PERO     Full                                                        +---------+---------------+---------+-----------+----------+--------------+     Summary: RIGHT: - There is no evidence of deep vein thrombosis in the lower extremity.  - No cystic structure found in the popliteal fossa.  LEFT: - There is no evidence of deep vein thrombosis in the lower extremity.  - No cystic structure found in the popliteal fossa.  *See table(s) above for measurements and observations. Electronically signed by Gretta Began MD on 07/04/2020 at 4:25:09 PM.    Final     CT ANGIO HEAD CODE STROKE   Result Date: 07/03/2020 CLINICAL DATA:  Code stroke follow-up EXAM: CT ANGIOGRAPHY HEAD AND NECK TECHNIQUE: Multidetector CT imaging of the head and neck was performed using the standard protocol during bolus administration of intravenous contrast. Multiplanar CT image reconstructions and MIPs were obtained to evaluate the vascular anatomy. Carotid stenosis measurements (when applicable) are obtained utilizing NASCET criteria, using the distal internal carotid diameter as the denominator. CONTRAST:  35mL OMNIPAQUE IOHEXOL 350 MG/ML SOLN COMPARISON:  None. FINDINGS: CTA NECK FINDINGS Aortic arch: Great vessel origins are patent. Right carotid system: Common and external carotid arteries are patent. There is occlusion of the cervical ICA approximately 2 cm above the origin. No reconstitution in the neck. Left  carotid system: Patent. Vertebral arteries: Patent.  Left vertebral artery is dominant. Skeleton: No acute abnormality. Other neck: No significant abnormality. Upper chest: Included upper lungs are clear. Review of the MIP images confirms the above findings CTA HEAD FINDINGS Anterior circulation: There is occlusion of the intracranial right ICA to the level of the PCOM origin where there is reconstitution. There is subsequent occlusion of the proximal right M1 MCA extending to the bifurcation where there is partial reconstitution. Left intracranial internal carotid, left middle cerebral artery, and both anterior cerebral arteries are patent. Mild calcified plaque along the left ICA. Posterior circulation: Intracranial vertebral arteries, basilar artery, and posterior cerebral arteries are patent. There are right larger than left posterior communicating arteries. Venous sinuses: As permitted by contrast timing, patent. 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 Reconstitution intracranially by a posterior communicating artery. Subsequent occlusion of the proximal right M1 MCA extending to the bifurcation where there is partial reconstitution. These results were communicated to Dr. Derry Lory at 2:13 pm on 07/03/2020 by text page via the [facility] messaging system. Electronically Signed   By: Guadlupe Spanish M.D.   On: 07/03/2020 14:23    CT ANGIO NECK CODE STROKE   Result Date: 07/03/2020 CLINICAL DATA:  Code stroke follow-up EXAM: CT ANGIOGRAPHY HEAD AND NECK TECHNIQUE: Multidetector CT imaging of the head and neck was performed using the standard protocol during bolus administration of intravenous contrast. Multiplanar CT image reconstructions and MIPs were obtained to evaluate the vascular anatomy. Carotid stenosis measurements (  when applicable) are obtained utilizing NASCET criteria, using the distal internal carotid  diameter as the denominator. CONTRAST:  60mL OMNIPAQUE IOHEXOL 350 MG/ML SOLN COMPARISON:  None. FINDINGS: CTA NECK FINDINGS Aortic arch: Great vessel origins are patent. Right carotid system: Common and external carotid arteries are patent. There is occlusion of the cervical ICA approximately 2 cm above the origin. No reconstitution in the neck. Left carotid system: Patent. Vertebral arteries: Patent.  Left vertebral artery is dominant. Skeleton: No acute abnormality. Other neck: No significant abnormality. Upper chest: Included upper lungs are clear. Review of the MIP images confirms the above findings CTA HEAD FINDINGS Anterior circulation: There is occlusion of the intracranial right ICA to the level of the PCOM origin where there is reconstitution. There is subsequent occlusion of the proximal right M1 MCA extending to the bifurcation where there is partial reconstitution. Left intracranial internal carotid, left middle cerebral artery, and both anterior cerebral arteries are patent. Mild calcified plaque along the left ICA. Posterior circulation: Intracranial vertebral arteries, basilar artery, and posterior cerebral arteries are patent. There are right larger than left posterior communicating arteries. Venous sinuses: As permitted by contrast timing, patent. 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 Reconstitution intracranially by a posterior communicating artery. Subsequent occlusion of the proximal right M1 MCA extending to the bifurcation where there is partial reconstitution. These results were communicated to Dr. Derry Lory at 2:13 pm on 07/03/2020 by text page via the [facility] messaging system. Electronically Signed   By: Guadlupe Spanish M.D.   On: 07/03/2020 14:23    IR ANGIO INTRA EXTRACRAN SEL COM CAROTID INNOMINATE UNI L MOD SED   Result Date: 07/05/2020 INDICATION: Acute onset of seizures. Left-sided  neglect and left-sided weakness with right gaze deviation. 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 EXAM: 1. EMERGENT LARGE VESSEL OCCLUSION THROMBOLYSIS (anterior CIRCULATION) COMPARISON:  CT angiogram of the head and neck of July 03, 2020. MEDICATIONS: Ancef 2 g IV antibiotic was administered within 1 hour of the procedure. ANESTHESIA/SEDATION: General anesthesia CONTRAST:  Omnipaque 300 approximately 120 mL FLUOROSCOPY TIME:  Fluoroscopy Time: 20 minutes 20 seconds (1427 mGy). COMPLICATIONS: None immediate. TECHNIQUE: Following a full explanation of the procedure along with the potential associated complications, an informed witnessed consent was obtained by the interpreter from his live-in girlfriend. The risks of intracranial hemorrhage of 10%, worsening neurological deficit, ventilator dependency, death and inability to revascularize were all reviewed in detail with the patient's girlfriend. The patient was then put under general anesthesia by the Department of Anesthesiology at [facility]. The right groin was prepped and draped in the usual sterile fashion. Thereafter using modified Seldinger technique, transfemoral access into the right common femoral artery was obtained without difficulty. Over a 0.035 inch guidewire an 8 French 25 cm Pinnacle sheath was inserted. Through this, and also over a 0.035 inch guidewire a 5 [place] select JB 1 catheter inside of an 087 balloon guide catheter was advanced to the aortic arch region and selectively positioned in the right common carotid artery. The guidewire, and select diagnostic catheter were removed. Good aspiration obtained from the hub of the balloon guide catheter in the distal right common carotid artery. An arteriogram was then performed centered intracranially and extracranially. FINDINGS: The right common carotid arteriogram demonstrates the right external carotid artery and its major branches to be widely patent.  The right internal carotid artery at  the bulb and distal to the bulb demonstrates a tapered angiographic appearance in its proximal 1/3 with complete occlusion with no filling defects and flap noted along the posterior wall proximally. No distal reconstitution is noted intracranially of the right ICA and cavernous and supraclinoid segments. PROCEDURE: Over a 0.014 inch standard Synchro micro guidewire with a J configuration, a combination of an 021 150 cm microcatheter inside of an 071 Zoom aspiration catheter was advanced without difficulty to the cavernous segment. The balloon guide catheter was now also advanced without any resistance to the distal cervical right ICA. A gentle control arteriogram performed through the balloon guide demonstrated complete occlusion of the right internal carotid artery at the distal cavernous segment. No antegrade flow was noticeable from the proximal right ICA. The micro guidewire was then gently advanced and manipulated with a torque device into the right middle cerebral artery proximally and then into the superior division followed by the microcatheter. Guidewire was removed. Slow aspiration of blood was obtained at the microcatheter hub. A very gentle contrast injection through the microcatheter demonstrated the tip of the microcatheter to be safely positioned with slow flow antegrade clearance of contrast. A 4 mm x 40 mm Solitaire retrieval device was then advanced to the distal end of the microcatheter. The Zoom aspiration catheter was now advanced into the distal right M1 segment. The O ring on the delivery microcatheter was then loosened. The retrieval device was then deployed in the usual fashion without difficulty. The wire was now retrieved more proximally in the Zoom aspiration catheter. Thereafter, constant aspiration was applied at the hub of the Zoom aspiration catheter, and with 20 mL syringe at the hub of the balloon guide catheter for approximately 3 minutes.  Thereafter, slow retrieval of the retrieval device, the microcatheter and the aspiration catheter was performed. Copious amounts of clot were seen in the canister of the aspiration device. Also noted was a piece of clot in the interstices of the retrieval device, and also in the Tuohy [place]. Following reversal of flow arrest in the right internal carotid artery, with free aspiration of blood obtained at the hub, a gentle control arteriogram performed demonstrates free flow in the right internal carotid artery distally in the petrous, cavernous and supraclinoid segments. Noted was opacification of the right middle cerebral artery and the right anterior cerebral artery distributions. Significant spasm was noted in the proximal right middle cerebral artery, and of the superior division of the right middle cerebral artery. These responded to 3 aliquots of 25 mcg of nitroglycerin intra-arterially. At this time, also noted was contrast overlying the right basal ganglia region on angiography. CT of the brain on the table demonstrated dense focal hyper density in the putamen and less prominent in the right caudate head representing contrast with blood. No change was seen in the patient's hemodynamic status at this time. A control arteriogram performed through the balloon guide in the right internal carotid artery continued to demonstrate excellent flow extra cranially and intracranially with now spasm seen in the supraclinoid right ICA, and persistent moderate spasm in the proximal right M1 segment in the superior divisions. The patient was given 5 mg of intra-arterial verapamil slowly. A repeat arteriogram performed approximately 5 minutes later demonstrated modest improvement in the spasm in the right middle cerebral artery. Repeat arteriograms at 20 minutes following the initial revascularization demonstrated significant improvement in the right middle cerebral artery and the right internal carotid artery vasospasm.  There continued to be a TICI 3 revascularization.  An exchange 0.014 inch Synchro micro guidewire was then advanced to the horizontal petrous segment. An arteriogram was then performed with the balloon guide catheter in the distal right common carotid artery. This continued to demonstrate excellent flow extra cranially and intracranially. No visible irregularity or an intimal flap was visible. Intracranially improvement is noted in the supraclinoid right ICA. No change was seen in the small focal outpouching in the right posterior communicating artery region suspicious for an aneurysm. A final control arteriogram performed through the right common carotid artery demonstrated patency of the right internal carotid artery extra cranially and intracranially without evidence of intraluminal filling defects or spasm. The right middle cerebral artery continued to maintain a TICI 3 revascularization. The right anterior cerebral artery remained widely patent. The balloon guide was removed. The 8 French Pinnacle sheath was then replaced with an 8 French Angio-Seal closure device for hemostasis in the right groin. Distal pulses remained palpable in both feet unchanged. Patient was left intubated on account of his medical condition. CT of the brain performed continued to demonstrate no change in the hyperdensity in the right basal ganglia region. Patient was then transferred to neuro ICU for post revascularization management. IMPRESSION: Status post endovascular complete revascularization of the right internal carotid artery terminus, the right middle cerebral artery, the right anterior cerebral artery, and the extracranial right internal carotid artery, with 1 pass with the 4 mm x 40 mm Solitaire retrieval device, with aspiration, and proximal flow arrest achieving a TICI 3 revascularization. 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 PLAN: Follow-up  in the clinic 4 to 6 weeks post discharge. Electronically Signed   By: Julieanne Cotton M.D.   On: 07/04/2020 11:00       Assessment/Plan: Diagnosis: RightBG/caudate head infarct  Stroke: Continue secondary stroke prophylaxis and Risk Factor Modification listed below:   Antiplatelet therapy:   Blood Pressure Management:  Continue current medication with prn's with permisive HTN per primary team Statin Agent:   Left sided hemiparesis: fit for orthosis to prevent contractures (resting hand splint for day, wrist cock up splint at night, PRAFO, etc) PT/OT for mobility, ADL training  Labs independently reviewed.  Records reviewed and summated above.   1. Does the need for close, 24 hr/day medical supervision in concert with the patient's rehab needs make it unreasonable for this patient to be served in a less intensive setting? Yes  2. Co-Morbidities requiring supervision/potential complications: lethargy, left inattention, proprioceptive deficits, history of R-SCA infarct with residual deficits, ABLA (repeat labs, consider transfusion if necessary to ensure appropriate perfusion for increased activity tolerance), encephalopathy, seizure prophylaxis (Keppra) 3. Due to bladder management, bowel management, safety, skin/wound care, disease management and patient education, does the patient require 24 hr/day rehab nursing? Yes 4. Does the patient require coordinated care of a physician, rehab nurse, therapy disciplines of PT/OT/SLP to address physical and functional deficits in the context of the above medical diagnosis(es)? Yes Addressing deficits in the following areas: balance, endurance, locomotion, strength, transferring, bathing, dressing, toileting, cognition, speech, swallowing and psychosocial support 5. Can the patient actively participate in an intensive therapy program of at least 3 hrs of therapy per day at least 5 days per week? Potentially 6. The potential for patient to make measurable  gains while on inpatient rehab is excellent 7. Anticipated functional outcomes upon discharge from inpatient rehab are supervision and min assist  with PT, supervision and min assist with OT,  modified independent with SLP. 8. Estimated rehab length of stay to reach the above functional goals is: 16-19 days. 9. Anticipated discharge destination: Home 10. Overall Rehab/Functional Prognosis: good   RECOMMENDATIONS: This patient's condition is appropriate for continued rehabilitative care in the following setting: CIR when medical work-up complete and able tolerate 3 hours of therapy per day. Patient has agreed to participate in recommended program. Yes Note that insurance prior authorization may be required for reimbursement for recommended care.   Comment: Rehab Admissions Coordinator to follow up.   I have personally performed a face to face diagnostic evaluation, including, but not limited to relevant history and physical exam findings, of this patient and developed relevant assessment and plan.  Additionally, I have reviewed and concur with the physician assistant's documentation above.    Maryla Morrow, MD, ABPMR Jacquelynn Cree, PA-C 07/05/2020        Revision History                                    Routing History           Note Details  Author Mark Fennel, MD File Time 07/05/2020  1:12 PM  Author Type Physician Status Signed  Last Editor Mark Fennel, MD Service Physical Medicine and Rehabilitation  [facility] Acct # [id number] Admit Date 07/12/2020

## 2020-07-12 NOTE — Progress Notes (Signed)
Discharge instructions reviewed with pt and wife; voiced understanding. Personal belongings were packed by pt's wife and staff and transported with pt. Report was given to [facility], Charity fundraiser. Pt. transported via hospital bed to 4W08.

## 2020-07-12 NOTE — Progress Notes (Signed)
Physical Therapy Treatment Patient Details Name: Mark Oneal MRN: [id number] DOB: [birth year] Today's Date: 07/12/2020    History of Present Illness Mark Oneal is a 34 y.o. 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 Pt found to have occluded R ICA, MCA, and ACA requiring thrombectomy by IR. Pt also received tPA.    PT Comments    Pt tolerates treatment well with improved transfer quality. Pt continues to demonstrate left inattention affecting his ability to manage assistive devices with LUE. Pt also with slowed processing and poor initiation of tasks, requiring verbal and tactile cueing to begin requested tasks. Finally pt continues to demonstrate impaired memory and awareness, attempting to enter multiple rooms of other patients during ambulation. Pt will benefit from continued acute PT POC to improve initiation and attention to left side. PT continues to recommend CIR placement at this time.   Follow Up Recommendations  CIR     Equipment Recommendations  Cane    Recommendations for Other Services       Precautions / Restrictions Precautions Precautions: Fall Precaution Comments: Lft inattention Restrictions Weight Bearing Restrictions: No    Mobility  Bed Mobility Overal bed mobility: Needs Assistance Bed Mobility: Supine to Sit     Supine to sit: Supervision;HOB elevated     General bed mobility comments: use of bed rail to pull up into sitting  Transfers Overall transfer level: Needs assistance Equipment used: Rolling walker (2 wheeled) Transfers: Sit to/from Stand Sit to Stand: Min guard Stand pivot transfers: Min guard       General transfer comment: PT assistance for safety, cues for hand placement, pt requries multiple cues to initiate  Ambulation/Gait Ambulation/Gait assistance: Min assist Gait Distance  (Feet): 150 Feet Assistive device: Rolling walker (2 wheeled) Gait Pattern/deviations: Step-to pattern Gait velocity: reduced Gait velocity interpretation: <1.8 ft/sec, indicate of risk for recurrent falls General Gait Details: pt with step to gait, poor control and attention to L hand on RW. Pt initially ambulating with only R hand on walker and requires PT cues to place L hand back on walker during ambulation. Pt also requires multiple cues for direction, often attempting to walk into other pt's rooms thinking they are his own even though they are nowhere close to his room   Stairs             Wheelchair Mobility    Modified Rankin (Stroke Patients Only) Modified Rankin (Stroke Patients Only) Pre-Morbid Rankin Score: No significant disability Modified Rankin: Moderately severe disability     Balance Overall balance assessment: Needs assistance Sitting-balance support: No upper extremity supported;Feet supported Sitting balance-Leahy Scale: Good     Standing balance support: Single extremity supported Standing balance-Leahy Scale: Poor Standing balance comment: reliant on UE support of RW                            Cognition Arousal/Alertness: Awake/alert Behavior During Therapy: Impulsive Overall Cognitive Status: Impaired/Different from baseline Area of Impairment: Attention;Memory;Following commands;Safety/judgement;Awareness;Problem solving                   Current Attention Level: Sustained Memory: Decreased recall of precautions;Decreased short-term memory Following Commands: Follows one step commands with increased time Safety/Judgement: Decreased awareness of safety;Decreased awareness of deficits Awareness: Emergent Problem Solving: Slow processing;Decreased initiation;Difficulty sequencing  General Comments: very slow processing, requires multiple cues to initiate at times      Exercises      General Comments General comments (skin  integrity, edema, etc.): 5x STS in 15 seconds (norm for his age is 6.1+/-1.4 sec) indicating decreased functional strength, impaired balance, and increased falls risk      Pertinent Vitals/Pain Pain Assessment: No/denies pain    Home Living                      Prior Function            PT Goals (current goals can now be found in the care plan section) Acute Rehab PT Goals Patient Stated Goal: get better Progress towards PT goals: Progressing toward goals    Frequency    Min 4X/week      PT Plan Current plan remains appropriate    Co-evaluation              AM-PAC PT "6 Clicks" Mobility   Outcome Measure  Help needed turning from your back to your side while in a flat bed without using bedrails?: None Help needed moving from lying on your back to sitting on the side of a flat bed without using bedrails?: None Help needed moving to and from a bed to a chair (including a wheelchair)?: A Little Help needed standing up from a chair using your arms (e.g., wheelchair or bedside chair)?: A Little Help needed to walk in hospital room?: A Little Help needed climbing 3-5 steps with a railing? : Total 6 Click Score: 18    End of Session Equipment Utilized During Treatment: Gait belt Activity Tolerance: Patient tolerated treatment well Patient left: in chair;with call bell/phone within reach;with chair alarm set Nurse Communication: Mobility status PT Visit Diagnosis: Unsteadiness on feet (R26.81);Muscle weakness (generalized) (M62.81);Difficulty in walking, not elsewhere classified (R26.2)     Time: 3614-4315 PT Time Calculation (min) (ACUTE ONLY): 17 min  Charges:  $Gait Training: 8-22 mins                     Arlyss Gandy, PT, DPT Acute Rehabilitation Pager: [phone]    Arlyss Gandy 07/12/2020, 9:55 AM

## 2020-07-12 NOTE — Progress Notes (Signed)
Inpatient Rehabilitation Admissions Coordinator  I have CIR bed available to admit pt to today. I met with patient at bedside and he is in agreement. I will alert Dr. Leonie Man, Acute team and TOC to make the arrangements to admit today.  Danne Baxter, RN, MSN Rehab Admissions Coordinator [phone] 07/12/2020 11:44 AM

## 2020-07-12 NOTE — Evaluation (Signed)
Speech Language Pathology Assessment and Plan  Patient Details  Name: Mark Oneal MRN: [id number] Date of Birth: [birth year]  SLP Diagnosis: Cognitive Impairments;Dysphagia  Rehab Potential: Good ELOS: 7-9 days    Today's Date: 07/13/2020 SLP Individual Time: 9735-3299 SLP Individual Time Calculation (min): 57 min   Hospital Problem: Principal Problem:   Acute ischemic left MCA stroke [facility])  Past Medical History:  Past Medical History:  Diagnosis Date  . History of stroke involving cerebellum 05/2019   Past Surgical History:  Past Surgical History:  Procedure Laterality Date  . BUBBLE STUDY  07/06/2020   Procedure: BUBBLE STUDY;  Surgeon: Donato Heinz, MD;  Location: [facility] ENDOSCOPY;  Service: Cardiovascular;;  . IR ANGIO INTRA EXTRACRAN SEL COM CAROTID INNOMINATE UNI L MOD SED  07/03/2020  . IR CT HEAD LTD  07/03/2020  . IR CT HEAD LTD  07/03/2020  . IR PERCUTANEOUS ART THROMBECTOMY/INFUSION INTRACRANIAL INC DIAG ANGIO  07/03/2020  . IR US GUIDE VASC ACCESS RIGHT  07/03/2020  . RADIOLOGY WITH ANESTHESIA N/A 07/03/2020   Procedure: IR WITH ANESTHESIA;  Surgeon: Radiologist, Medication, MD;  Location: [facility];  Service: Radiology;  Laterality: N/A;  . TEE WITHOUT CARDIOVERSION N/A 07/06/2020   Procedure: TRANSESOPHAGEAL ECHOCARDIOGRAM (TEE);  Surgeon: Donato Heinz, MD;  Location: [facility] ENDOSCOPY;  Service: Cardiovascular;  Laterality: N/A;    Assessment / Plan / Recommendation Clinical Impression   HPI: Mark Oneal is a 34 year old male with history of prior R-SCA infarct who was admitted on 07/03/2020 after a fall with resulting dysarthria and "jerking" of BUE/BLE.  History taken from chart review and patient.  On evaluation by EMS, he was found to have right gaze deviation and left hemiparesis.  He was loaded with Keppra with recommendations to continue this until clinic follow-up.  UDS negative.  CT head showed hyperdense right MCA sign and CTA head/neck showed  occlusion of right-ICA 2 cm of the origin with reconstitution by PCA and subsequent occlusion of proximal right M1 MCA.  He underwent cerebral angiogram with complete revascularization of occluded right ICA and ICA terminus, right MCA and right-ACA with question of proximal right ICA dissection.  Follow-up CT head showed left basal ganglia/caudate head infarct with contrast brain--question bleed and mild cerebral edema.  He was started on hypertonic saline.  EEG showing severe diffuse encephalopathy, negative for seizures he tolerated extubation on 07/04/2020. Hypercoagulability panel showed slight elevation in ACL IgM.  TEE negative for PFO or cardiac source of emboli.  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  Dr. Leonie Man felt the stroke was embolic due to unknown source versus possible dissection and patient to continue DAPT x3 weeks followed by Plavix alone.  Hospital course further complicated by dysphagia.  MBS done and he has been advanced to dysphagia 3, nectar liquids. Patient has had issues with leukocytosis as well as low-grade fevers with tachypnea likely due to aspiration pneumonia with follow-up chest x-ray showing LLL pneumonia and patient started on cefdinir on 10/26.  Pt admitted to CIR 07/12/20 and SLP evaluations completed 07/13/20 with results as follows:  Pt presents with a mild-mod oropharyngeal dysphagia. Last MBSS conducted 10/20, which indicated pt has silent aspiration of thins due to premature spillage of liquid boluses and reduced hyolaryngeal excursion. Pt also with recent chest x-ray that was concerning for aspiration pneumonia (infiltrate in left lung base), therefore, no thins were attempted  today. Pt did exhibit relatively timely mastication of dys 3 breakfast solids, however left pocketing present and pt with no awareness, requiring Min-Mod cueing for lingual sweep and  liquid washes to clear. No overt s/sx aspiration noted across solid or liquid intake. Pt's reduced attention span also further impacts his dysphagia, particularly the oral phase, as he intermittently requires cues to continue bolus prep and take attention to intake in general. Recommend continue current diet, full supervision during meals to ensure use of swallow strategies.   Pt also presents with mild cognitive deficits most remarkable for reduced emergent and anticipatory awareness, attention, and problem solving. He was also disoriented to place, but able to state he knew he was going to get rehab. Although he attended to left side of environment and verbalized awareness that his left side is impacted by acute CVA, he demonstrated significant left visual inattention during structured tasks such as clock drawing. Pt scored 20/30 on SLUMS evaluation, which is indicative of mild neurocognitive disorder (according to scoring for his level of education - < high school). Although very mild articulatory imprecision noted, he reports his speech clarity has improved drastically since admission and is not impacting his intelligibility at this time. Expressive and receptive language skills were also determined WFL.   Recommend pt receive skilled ST services while inpatient in order to address dysphagia and cognitive deficits in order to ensure diet safety and efficiency and maximize his safety and functional independence prior to his d/c home with family.    Skilled Therapeutic Interventions          Bedside swallow and cognitive-linguistic evaluations were administered and results were reviewed with pt (please see above for details regarding results).   SLP Assessment  Patient will need skilled Speech Lanaguage Pathology Services during CIR admission    Recommendations  SLP Diet Recommendations: Dysphagia 3 (Mech soft);Thin Medication Administration: Crushed with puree Supervision: Staff to assist with self  feeding Compensations: Lingual sweep for clearance of pocketing;Monitor for anterior loss;Slow rate;Small sips/bites;Minimize environmental distractions Postural Changes and/or Swallow Maneuvers: Seated upright 90 degrees Oral Care Recommendations: Oral care BID Patient destination: Home Follow up Recommendations: 24 hour supervision/assistance (follow up ST TBD depending on progress) Equipment Recommended: None recommended by SLP    SLP Frequency 3 to 5 out of 7 days   SLP Duration  SLP Intensity  SLP Treatment/Interventions 7-9 days  Minumum of 1-2 x/day, 30 to 90 minutes  Cognitive remediation/compensation;Cueing hierarchy;Dysphagia/aspiration precaution training;Patient/family education;Functional tasks    Pain Pain Assessment Pain Scale: Faces Pain Score: 0-No pain      SLP Evaluation Cognition Overall Cognitive Status: Impaired/Different from baseline Arousal/Alertness: Awake/alert Orientation Level: Oriented to person;Oriented to time;Oriented to situation Attention: Sustained Sustained Attention: Impaired Sustained Attention Impairment: Functional basic Memory: Appears intact Awareness: Impaired Awareness Impairment: Emergent impairment;Anticipatory impairment Problem Solving: Impaired Problem Solving Impairment: Functional basic;Verbal basic Behaviors: Restless (slightly physically restless) Safety/Judgment: Impaired  Comprehension Auditory Comprehension Overall Auditory Comprehension: Appears within functional limits for tasks assessed Conversation: Simple Visual Recognition/Discrimination Discrimination: Not tested Reading Comprehension Reading Status: Not tested Expression Expression Primary Mode of Expression: Verbal Verbal Expression Overall Verbal Expression: Appears within functional limits for tasks assessed Written Expression Written Expression: Not tested Oral Motor Oral Motor/Sensory Function Overall Oral Motor/Sensory Function: Mild  impairment Facial ROM: Reduced left;Suspected CN VII (facial) dysfunction Facial Symmetry: Abnormal symmetry left;Suspected CN VII (facial) dysfunction Facial Strength: Reduced left;Suspected CN VII (facial) dysfunction Facial Sensation: Reduced left;Suspected CN V (Trigeminal) dysfunction Lingual ROM: Within Functional  Limits Lingual Symmetry: Within Functional Limits Mandible: Within Functional Limits Motor Speech Overall Motor Speech: Appears within functional limits for tasks assessed Intelligibility: Intelligible Motor Planning: Witnin functional limits Motor Speech Errors: Not applicable  Care Tool Care Tool Cognition Expression of Ideas and Wants     Understanding Verbal and Non-Verbal Content     Memory/Recall Ability *first 3 days only       Intelligibility: Intelligible  Bedside Swallowing Assessment General Date of Onset: 07/03/20 Previous Swallow Assessment: MBSS 10/20 Diet Prior to this Study: Dysphagia 3 (soft);Nectar-thick liquids Temperature Spikes Noted: No Respiratory Status: Room air History of Recent Intubation: Yes Length of Intubations (days): 1 days Date extubated: 07/04/20 Behavior/Cognition: Alert;Cooperative;Requires cueing Oral Cavity - Dentition: Adequate natural dentition Self-Feeding Abilities: Able to feed self Vision: Impaired for self-feeding (left inattention) Baseline Vocal Quality: Normal Volitional Cough: Strong Volitional Swallow: Able to elicit  Oral Care Assessment Does patient have any of the following "high(er) risk" factors?: None of the above Does patient have any of the following "at risk" factors?: Other - dysphagia;Diet - patient on thickened liquids Patient is AT RISK: Order set for Adult Oral Care Protocol initiated -  "At Risk Patients" option selected (see row information) Patient is LOW RISK: Follow universal precautions (see row information) Ice Chips Ice chips: Not tested Thin Liquid Thin Liquid: Not  tested Nectar Thick Nectar Thick Liquid: Within functional limits Presentation: Cup;Self Fed Honey Thick Honey Thick Liquid: Not tested Puree Puree: Not tested Solid Solid: Impaired Presentation: Self Fed Oral Phase Impairments: Impaired mastication Oral Phase Functional Implications: Left lateral sulci pocketing BSE Assessment Risk for Aspiration Impact on safety and function: Moderate aspiration risk Other Related Risk Factors: Previous CVA;Cognitive impairment  Short Term Goals: Week 1: SLP Short Term Goal 1 (Week 1): STG=LTG due to short estimated length of stay  Refer to Care Plan for Long Term Goals  Recommendations for other services: None   Discharge Criteria: Patient will be discharged from SLP if patient refuses treatment 3 consecutive times without medical reason, if treatment goals not met, if there is a change in medical status, if patient makes no progress towards goals or if patient is discharged from hospital.  The above assessment, treatment plan, treatment alternatives and goals were discussed and mutually agreed upon: by patient  Arbutus Leas 07/13/2020, 11:47 AM

## 2020-07-12 NOTE — Progress Notes (Signed)
Jamse Arn, MD  Physician  Physical Medicine and Rehabilitation  PMR Pre-admission      Addendum  Date of Service:  07/06/2020  2:25 PM      Related encounter: ED to Hosp-Admission (Discharged) from 07/03/2020 in [place]       Show:Clear all [x] Manual[x] Template[x] Copied  Added by: [x] Rannie Craney, Vertis Kelch, RN[x] Jamse Arn, MD  [] Hover for details PMR Admission Coordinator Pre-Admission Assessment   Patient: Mark Oneal is an 34 y.o., male MRN: [id number] DOB: 1985/10/25 Height: 5' 4"  (162.6 cm) Weight: 90.3 kg                                                                                                                                                  Insurance Information   PRIMARY: uninsured         First source contacted on 10/20 to assess for financial assistance. Elie Confer Revels at [phone]   Financial Counselor:       Phone#:    The "Data Collection Information Summary" for patients in Inpatient Rehabilitation Facilities with attached "Privacy Act [place] Records" was provided and verbally reviewed with: N/A   Emergency Contact Information Contact Information       Name Relation Home Work [place], Erin Sons Spouse     [phone]    Szymon, Foiles GYKZ     [phone]         Current Medical History  Patient Admitting Diagnosis: CVA   History of Present Illness: 34 y.o. male with history of R-SCA infarct with residual left limp otherwise in relatively good health. He was admitted on 07/03/2020 with dysarthria, fall and "jerking" activity of BUE/BLE.   On evaluation by EMS, he was found to have right gaze deviation and left sided hemiparesis. UDS negative. CT head showed hyperdense R-MCA sign and CTA head/neck showed occlusion of R-ICA 2 cm above origin with reconstitution by PCA and subsequent occlusion of proximal right M1 MCA. He underwent cerebral angiogram with complete revascularization of occluded R-ICA  and ICA terminus, R-MCA and R-ACA--question proximal right ICA dissection.  Follow up CT head showed left BG/caudate head infarct with contrast stain (? Bleed)  and mild cerebral edema. He was loaded with Keppra and to continue until clinic follow-up.  He was started on hypertonic saline.  EEG performed, showing severe diffuse encephalopathy, negative for seizures He tolerated extubation on 07/04/2020.TEE no SOE, negative bubble. VTE lovenox prophylaxis.   He was started on aspirin and Plavix.  Patient with resultant higher level cognitive deficits, bouts of lethargy, left hemiparesis with left inattention, proprioceptive deficits and lack of awareness of deficits.    Back pain noted on 10/25. Diclofenac patch given . Low grade temperatures also noted with elevated WBC. CXR with left lower infiltrate. Concerning for pneumonia. UA negative. Given Cefdinir bid. Back pain  resolved.   Complete NIHSS TOTAL: 4 Glasgow Coma Scale Score: 15   Past Medical History      Past Medical History:  Diagnosis Date  . History of stroke involving cerebellum 05/2019      Family History  family history includes Hypertension in his father and mother.   Prior Rehab/Hospitalizations:  Has the patient had prior rehab or hospitalizations prior to admission? Yes   Has the patient had major surgery during 100 days prior to admission? Yes   Current Medications    Current Facility-Administered Medications:  .  0.9 %  sodium chloride infusion, , Intravenous, Continuous, Rosalin Hawking, MD, Last Rate: 50 mL/hr at 07/11/20 2143, New Bag at 07/11/20 2143 .  acetaminophen (TYLENOL) tablet 650 mg, 650 mg, Oral, Q4H PRN, 650 mg at 07/09/20 2009 **OR** acetaminophen (TYLENOL) 160 MG/5ML solution 650 mg, 650 mg, Per Tube, Q4H PRN **OR** acetaminophen (TYLENOL) suppository 650 mg, 650 mg, Rectal, Q4H PRN, Deveshwar, Sanjeev, MD .  aspirin chewable tablet 81 mg, 81 mg, Oral, Daily, Rosalin Hawking, MD, 81 mg at 07/12/20 0920 .   atorvastatin (LIPITOR) tablet 80 mg, 80 mg, Oral, Daily, Rosalin Hawking, MD, 80 mg at 07/12/20 0920 .  cefdinir (OMNICEF) capsule 300 mg, 300 mg, Oral, Q12H, Biby, Sharon L, NP, 300 mg at 07/12/20 0921 .  chlorhexidine gluconate (MEDLINE KIT) (PERIDEX) 0.12 % solution 15 mL, 15 mL, Mouth Rinse, BID, Donnetta Simpers, MD, 15 mL at 07/11/20 1135 .  Chlorhexidine Gluconate Cloth 2 % PADS 6 each, 6 each, Topical, Daily, Bhagat, Srishti L, MD, 6 each at 07/12/20 0923 .  clopidogrel (PLAVIX) tablet 75 mg, 75 mg, Oral, Daily, Rosalin Hawking, MD, 75 mg at 07/12/20 0920 .  diclofenac (FLECTOR) 1.3 % 1 patch, 1 patch, Transdermal, BID, Bhagat, Srishti L, MD, 1 patch at 07/10/20 2200 .  docusate (COLACE) 50 MG/5ML liquid 100 mg, 100 mg, Oral, BID, Desai, Rahul P, PA-C, 100 mg at 07/11/20 2133 .  enoxaparin (LOVENOX) injection 40 mg, 40 mg, Subcutaneous, Daily, Rosalin Hawking, MD, 40 mg at 07/12/20 0920 .  levETIRAcetam (KEPPRA) tablet 500 mg, 500 mg, Oral, BID, Rosalin Hawking, MD, 500 mg at 07/12/20 0920 .  lidocaine (LIDODERM) 5 % 1 patch, 1 patch, Transdermal, Daily, Bhagat, Srishti L, MD .  MEDLINE mouth rinse, 15 mL, Mouth Rinse, BID, Rosalin Hawking, MD, 15 mL at 07/12/20 0924 .  pantoprazole (PROTONIX) injection 40 mg, 40 mg, Intravenous, Daily, Rosalin Hawking, MD, 40 mg at 07/12/20 0928 .  polyethylene glycol (MIRALAX / GLYCOLAX) packet 17 g, 17 g, Oral, Daily, Desai, Rahul P, PA-C, 17 g at 07/11/20 1130 .  [employer], , Oral, PRN, Rosalin Hawking, MD .  senna-docusate (Senokot-S) tablet 1 tablet, 1 tablet, Oral, QHS PRN, Donnetta Simpers, MD .  senna-docusate (Senokot-S) tablet 1 tablet, 1 tablet, Oral, BID, Donnetta Simpers, MD, 1 tablet at 07/11/20 2131   Patients Current Diet:  Diet Order                  DIET DYS 3 Room service appropriate? Yes with Assist; Fluid consistency: Nectar Thick  Diet effective now                         Precautions / Restrictions Precautions Precautions:  Fall Precaution Comments: Lft inattention Restrictions Weight Bearing Restrictions: No    Has the patient had 2 or more falls or a fall with injury in the past year?No   Prior  Activity Level Community (5-7x/wk): independent, working, driving. Moved form [place] 3 months ago. Works in Architect.    Prior Functional Level Prior Function Level of Independence: Independent Comments: works in [employer]; reports has a 61 y/o daughter and 2 y/o son   Self Care: Did the patient need help bathing, dressing, using the toilet or eating?  Independent   Indoor Mobility: Did the patient need assistance with walking from room to room (with or without device)? Independent   Stairs: Did the patient need assistance with internal or external stairs (with or without device)? Independent   Functional Cognition: Did the patient need help planning regular tasks such as shopping or remembering to take medications? Independent   Home Assistive Devices / Equipment   Prior Device Use: Indicate devices/aids used by the patient prior to current illness, exacerbation or injury? None of the above   Current Functional Level Cognition   Arousal/Alertness: Awake/alert Overall Cognitive Status: Impaired/Different from baseline Current Attention Level: Sustained Orientation Level: Oriented X4 Following Commands: Follows one step commands with increased time Safety/Judgement: Decreased awareness of safety, Decreased awareness of deficits General Comments: very slow processing, requires multiple cues to initiate at times Attention: Sustained Sustained Attention: Impaired Sustained Attention Impairment: Functional basic Awareness: Impaired Awareness Impairment: Emergent impairment, Intellectual impairment Problem Solving: Impaired Problem Solving Impairment: Functional basic Safety/Judgment: Impaired    Extremity Assessment (includes Sensation/Coordination)   Upper Extremity Assessment: LUE  deficits/detail LUE Deficits / Details: improvements in LUE movement noted today, remains with poor /weak grasp and fine motor strength LUE Sensation: decreased light touch, decreased proprioception LUE Coordination: decreased fine motor, decreased gross motor  Lower Extremity Assessment: Defer to PT evaluation LLE Deficits / Details: grossly 2+/5, withdraw to pain     ADLs   Overall ADL's : Needs assistance/impaired Eating/Feeding: NPO Grooming: Moderate assistance, Sitting Upper Body Bathing: Moderate assistance, Sitting Lower Body Bathing: Maximal assistance, +2 for physical assistance, +2 for safety/equipment, Sit to/from stand Upper Body Dressing : Maximal assistance, Sitting Lower Body Dressing: Maximal assistance, +2 for physical assistance, +2 for safety/equipment, Sit to/from stand Lower Body Dressing Details (indicate cue type and reason): attempts to don socks at EOB but ultimately requiring assist including assist for sitting balance and to support LEs in modified figure 4 Toileting- Clothing Manipulation and Hygiene: Total assistance, +2 for physical assistance, +2 for safety/equipment, Sit to/from stand Functional mobility during ADLs: Maximal assistance, +2 for physical assistance, +2 for safety/equipment (sit<>Stand and side steps along EOB) General ADL Comments: focus on therapeutic activity seated EOB incorporating LUE     Mobility   Overal bed mobility: Needs Assistance Bed Mobility: Supine to Sit Rolling:  (cues required for sequencing) Sidelying to sit: Min guard Supine to sit: Supervision, HOB elevated Sit to supine: Min guard, HOB elevated General bed mobility comments: use of bed rail to pull up into sitting     Transfers   Overall transfer level: Needs assistance Equipment used: Rolling walker (2 wheeled) Transfers: Sit to/from Stand Sit to Stand: Min guard Stand pivot transfers: Min guard  Lateral/Scoot Transfers: Min guard General transfer comment: PT  assistance for safety, cues for hand placement, pt requries multiple cues to initiate     Ambulation / Gait / Stairs / Wheelchair Mobility   Ambulation/Gait Ambulation/Gait assistance: Herbalist (Feet): 150 Feet Assistive device: Rolling walker (2 wheeled) Gait Pattern/deviations: Step-to pattern General Gait Details: pt with step to gait, poor control and attention to L hand on RW. Pt initially ambulating with  only R hand on walker and requires PT cues to place L hand back on walker during ambulation. Pt also requires multiple cues for direction, often attempting to walk into other pt's rooms thinking they are his own even though they are nowhere close to his room Gait velocity: reduced Gait velocity interpretation: <1.8 ft/sec, indicate of risk for recurrent falls     Posture / Balance Dynamic Sitting Balance Sitting balance - Comments: pt tending to rely on RUE support but able to sit without support. Balance Overall balance assessment: Needs assistance Sitting-balance support: No upper extremity supported, Feet supported Sitting balance-Leahy Scale: Good Sitting balance - Comments: pt tending to rely on RUE support but able to sit without support. Standing balance support: Single extremity supported Standing balance-Leahy Scale: Poor Standing balance comment: reliant on UE support of RW     Special needs/care consideration   Patient speaks English; wife primary Spanish Seizure precautions with plans for no driving until seizure free for 6 months an d under physicians care    Previous Home Environment  Living Arrangements: Spouse/significant other, Children  Lives With: Spouse, Son, Daughter Available Help at Discharge: Family, Available 24 hours/day (wife) Type of Home: House Home Layout: One level Home Access: Level entry Bathroom Shower/Tub: Chiropodist: Standard Bathroom Accessibility: Yes How Accessible: Accessible via walker Knott: No   Discharge Living Setting Plans for Discharge Living Setting: Patient's home, Lives with (comment) (wife and 20 year old daughter and 1 year old son) Type of Home at Discharge: House Discharge Home Layout: One level Discharge Home Access: Level entry Discharge Bathroom Shower/Tub: Tub/shower unit Discharge Bathroom Toilet: Standard Discharge Bathroom Accessibility: Yes How Accessible: Accessible via walker Does the patient have any problems obtaining your medications?: Yes (Describe) (uninsured)   Social/Family/Support Systems Patient Roles: Spouse, Parent Contact Information: wife, Erin Sons, SPanish speaking Anticipated Caregiver: wife Anticipated Caregiver's Contact Information: [phone] Ability/Limitations of Caregiver: none Caregiver Availability: 24/7 Discharge Plan Discussed with Primary Caregiver: Yes Is Caregiver In Agreement with Plan?: Yes Does Caregiver/Family have Issues with Lodging/Transportation while Pt is in Rehab?: No   Wife cares for autistic son. Pt's Mom trying to get Visa/Passport issues resolved to come visit to help from [place]. She was to visit in December. Aunt in [place], but has no transportation.   Goals Patient/Family Goal for Rehab: supervision/Mod I with PT and OT, supervision with SLP Expected length of stay: [facility] 8-12 days. Cultural Considerations: SPanish speaking; patient can speak [place] , wife needs interpreter Additional Information: Pt's Mom trying to get Visa/Passport issues resolved to come from [place] to help (AUnt lives in [place] but does not have transportation) Pt/Family Agrees to Admission and willing to participate: Yes Program Orientation Provided & Reviewed with Pt/Caregiver Including Roles  & Responsibilities: Yes   Decrease burden of Care through IP rehab admission: n/a   Possible need for SNF placement upon discharge:not anticipated   Patient Condition: This patient's medical and functional status  has changed since the consult dated: 07/05/2020 in which the Rehabilitation Physician determined and documented that the patient's condition is appropriate for intensive rehabilitative care in an inpatient rehabilitation facility. See "History of Present Illness" (above) for medical update. Functional changes [id number] min to mod assist. Patient's medical and functional status update has been discussed with the Rehabilitation physician and patient remains appropriate for inpatient rehabilitation. Will admit to inpatient rehab today.   Preadmission Screen Completed By: Danne Baxter RN MSN with updates by Julious Payer, Audelia Acton, RN,  07/12/2020 11:46 AM ______________________________________________________________________   Discussed status with Dr. Posey Pronto on 07/12/2020 at  1146 and received approval for admission today.   Admission Coordinator: Danne Baxter RN MSN with updates by Julious Payer, Audelia Acton, time 1146 Date 07/12/2020         Revision History                                              Note Details  Author Jamse Arn, MD File Time 07/12/2020 11:53 AM  Author Type Physician Status Addendum  Last Editor Jamse Arn, MD Service Physical Medicine and [place] # [id number] Admit Date 07/12/2020

## 2020-07-12 NOTE — H&P (Signed)
Physical Medicine and Rehabilitation Admission H&P    Chief Complaint  Patient presents with  . Stroke with functional deficits.     HPI: Mark Oneal is a 34 year old male with history of prior R-SCA infarct who was admitted on 07/03/2020 after a fall with resulting dysarthria and "jerking" of BUE/BLE.  History taken from chart review and patient.  On evaluation by EMS, he was found to have right gaze deviation and left hemiparesis.  He was loaded with Keppra with recommendations to continue this until clinic follow-up.  UDS negative.  CT head showed hyperdense right MCA sign and CTA head/neck showed occlusion of right-ICA 2 cm of the origin with reconstitution by PCA and subsequent occlusion of proximal right M1 MCA.  He underwent cerebral angiogram with complete revascularization of occluded right ICA and ICA terminus, right MCA and right-ACA with question of proximal right ICA dissection.  Follow-up CT head showed left basal ganglia/caudate head infarct with contrast brain--question bleed and mild cerebral edema.  He was started on hypertonic saline.  EEG showing severe diffuse encephalopathy, negative for seizures he tolerated extubation on 07/04/2020.  Hypercoagulability panel showed slight elevation in ACL IgM.  TEE negative for PFO or cardiac source of emboli.  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  Dr. Pearlean Brownie felt the stroke was embolic due to unknown source versus possible dissection and patient to continue DAPT x3 weeks followed by Plavix alone.  Hospital course further complicated by dysphagia.  MBS done and he has been advanced to dysphagia 3, nectar liquids. Patient has had issues with leukocytosis as well as low-grade fevers with tachypnea likely due to aspiration pneumonia with follow-up chest x-ray showing LLL pneumonia and patient started on cefdinir on 10/26.  Please see  preadmission assessment from earlier today as well.  Review of Systems  Constitutional: Negative for chills and fever.  HENT: Negative for hearing loss.   Eyes: Negative for blurred vision and double vision.  Respiratory: Negative for cough and shortness of breath.   Cardiovascular: Negative for chest pain and palpitations.  Gastrointestinal: Negative for constipation, diarrhea, heartburn and nausea.  Genitourinary: Negative for dysuria and urgency.  Musculoskeletal: Negative for back pain, joint pain and neck pain.  Skin: Negative for rash.  Neurological: Positive for speech change, focal weakness and weakness. Negative for dizziness and sensory change.  Psychiatric/Behavioral: The patient is not nervous/anxious and does not have insomnia.   All other systems reviewed and are negative.   Past Medical History:  Diagnosis Date  . 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 BUBBLE STUDY  07/06/2020   Procedure: BUBBLE STUDY;  Surgeon: Little Ishikawa, MD;  Location: [facility] ENDOSCOPY;  Service: Cardiovascular;;  . IR ANGIO INTRA EXTRACRAN SEL COM CAROTID INNOMINATE UNI L MOD SED  07/03/2020  . IR CT HEAD LTD  07/03/2020  . IR CT HEAD LTD  07/03/2020  . IR PERCUTANEOUS ART THROMBECTOMY/INFUSION INTRACRANIAL INC DIAG ANGIO  07/03/2020  . IR US GUIDE VASC ACCESS RIGHT  07/03/2020  . RADIOLOGY WITH ANESTHESIA N/A 07/03/2020   Procedure: IR WITH ANESTHESIA;  Surgeon: Radiologist, Medication, MD;  Location: [facility] OR;  Service: Radiology;  Laterality: N/A;  . TEE WITHOUT CARDIOVERSION N/A 07/06/2020   Procedure: TRANSESOPHAGEAL ECHOCARDIOGRAM (TEE);  Surgeon: Little Ishikawa, MD;  Location: [facility] ENDOSCOPY;  Service:  Cardiovascular;  Laterality: N/A;    Family History  Problem Relation Age of Onset  . Hypertension Mother   . Hypertension Father     Social History: Married. Moved from [place] a few months ago. Works in  Holiday representative. He reports that he has quit smoking. His smoking use included cigarettes. He has never used smokeless tobacco. He reports current alcohol use of about 3-20 ounces of beer daily. No history on file for drug use.    Allergies: No Known Allergies    Medications Prior to Admission  Medication Sig Dispense Refill  . aspirin EC 81 MG tablet Take 81 mg by mouth daily. Swallow whole.      Drug Regimen Review  Drug regimen was reviewed and remains appropriate with no significant issues identified  Home: Home Living Family/patient expects to be discharged to:: Private residence Living Arrangements: Spouse/significant other, Children Available Help at Discharge: Family, Available 24 hours/day (wife) Type of Home: House Home Access: Level entry Home Layout: One level Bathroom Shower/Tub: Associate Professor: Yes  Lives With: Spouse, Son, Daughter   Functional History: Prior Function Level of Independence: Independent Comments: works in [employer]; reports has a 56 y/o daughter and 2 y/o son  Functional Status:  Mobility: Bed Mobility Overal bed mobility: Needs Assistance Bed Mobility: Supine to Sit Rolling:  (cues required for sequencing) Sidelying to sit: Min guard Supine to sit: Supervision, HOB elevated Sit to supine: Min guard, HOB elevated General bed mobility comments: use of bed rail to pull up into sitting Transfers Overall transfer level: Needs assistance Equipment used: Rolling walker (2 wheeled) Transfers: Sit to/from Stand Sit to Stand: Min guard Stand pivot transfers: Min guard  Lateral/Scoot Transfers: Min guard General transfer comment: PT assistance for safety, cues for hand placement, pt requries multiple cues to initiate Ambulation/Gait Ambulation/Gait assistance: Min assist Gait Distance (Feet): 150 Feet Assistive device: Rolling walker (2 wheeled) Gait Pattern/deviations: Step-to pattern General  Gait Details: pt with step to gait, poor control and attention to L hand on RW. Pt initially ambulating with only R hand on walker and requires PT cues to place L hand back on walker during ambulation. Pt also requires multiple cues for direction, often attempting to walk into other pt's rooms thinking they are his own even though they are nowhere close to his room Gait velocity: reduced Gait velocity interpretation: <1.8 ft/sec, indicate of risk for recurrent falls    ADL: ADL Overall ADL's : Needs assistance/impaired Eating/Feeding: NPO Grooming: Moderate assistance, Sitting Upper Body Bathing: Moderate assistance, Sitting Lower Body Bathing: Maximal assistance, +2 for physical assistance, +2 for safety/equipment, Sit to/from stand Upper Body Dressing : Maximal assistance, Sitting Lower Body Dressing: Maximal assistance, +2 for physical assistance, +2 for safety/equipment, Sit to/from stand Lower Body Dressing Details (indicate cue type and reason): attempts to don socks at EOB but ultimately requiring assist including assist for sitting balance and to support LEs in modified figure 4 Toileting- Clothing Manipulation and Hygiene: Total assistance, +2 for physical assistance, +2 for safety/equipment, Sit to/from stand Functional mobility during ADLs: Maximal assistance, +2 for physical assistance, +2 for safety/equipment (sit<>Stand and side steps along EOB) General ADL Comments: focus on therapeutic activity seated EOB incorporating LUE  Cognition: Cognition Overall Cognitive Status: Impaired/Different from baseline Arousal/Alertness: Awake/alert Orientation Level: Oriented X4 Attention: Sustained Sustained Attention: Impaired Sustained Attention Impairment: Functional basic Awareness: Impaired Awareness Impairment: Emergent impairment, Intellectual impairment Problem Solving: Impaired Problem Solving Impairment: Functional basic Safety/Judgment:  Impaired Cognition  Arousal/Alertness: Awake/alert Behavior During Therapy: Impulsive Overall Cognitive Status: Impaired/Different from baseline Area of Impairment: Attention, Memory, Following commands, Safety/judgement, Awareness, Problem solving Current Attention Level: Sustained Memory: Decreased recall of precautions, Decreased short-term memory Following Commands: Follows one step commands with increased time Safety/Judgement: Decreased awareness of safety, Decreased awareness of deficits Awareness: Emergent Problem Solving: Slow processing, Decreased initiation, Difficulty sequencing General Comments: very slow processing, requires multiple cues to initiate at times   Blood pressure 107/69, pulse 78, temperature 98.3 F (36.8 C), temperature source Oral, resp. rate 18, height 5\' 4"  (1.626 m), weight 90.3 kg, SpO2 100 %. Physical Exam Vitals reviewed.  Constitutional:      General: He is not in acute distress.    Appearance: He is obese.  HENT:     Head: Normocephalic and atraumatic.     Right Ear: External ear normal.     Left Ear: External ear normal.     Nose: Nose normal.  Eyes:     General:        Right eye: No discharge.        Left eye: No discharge.     Extraocular Movements: Extraocular movements intact.  Cardiovascular:     Rate and Rhythm: Normal rate and regular rhythm.  Pulmonary:     Effort: Pulmonary effort is normal.     Breath sounds: Normal breath sounds.  Abdominal:     General: Abdomen is flat. Bowel sounds are normal. There is no distension.  Musculoskeletal:     Cervical back: Normal range of motion and neck supple.     Comments: No edema or tenderness in extremities  Skin:    General: Skin is warm and dry.  Neurological:     Mental Status: He is alert and oriented to person, place, and time.     Comments: Alert Motor: RUE: 5/5 proximal distal LUE: 4 --4/5 proximal distal LLE: 4-4+/5 proximal distal Sensation intact light touch Dysarthria   Psychiatric:        Mood and Affect: Mood normal.        Behavior: Behavior normal.     Results for orders placed or performed during the hospital encounter of 07/03/20 (from the past 48 hour(s))  CBC     Status: Abnormal   Collection Time: 07/11/20  4:57 AM  Result Value Ref Range   WBC 12.3 (H) 4.0 - 10.5 K/uL   RBC 3.66 (L) 4.22 - 5.81 MIL/uL   Hemoglobin 11.6 (L) 13.0 - 17.0 g/dL   HCT 07/13/20 (L) 39 - 52 %   MCV 90.7 80.0 - 100.0 fL   MCH 31.7 26.0 - 34.0 pg   MCHC 34.9 30.0 - 36.0 g/dL   RDW 34.7 (L) 42.5 - 95.6 %   Platelets 259 150 - 400 K/uL   nRBC 0.0 0.0 - 0.2 %    Comment: Performed at [facility], [address], [place], [place]  Basic metabolic panel     Status: Abnormal   Collection Time: 07/11/20  4:57 AM  Result Value Ref Range   Sodium 135 135 - 145 mmol/L   Potassium 3.8 3.5 - 5.1 mmol/L   Chloride 100 98 - 111 mmol/L   CO2 26 22 - 32 mmol/L   Glucose, Bld 111 (H) 70 - 99 mg/dL    Comment: Glucose reference range applies only to samples taken after fasting for at least 8 hours.   BUN 16 6 - 20 mg/dL   Creatinine, Ser 07/13/20  0.61 - 1.24 mg/dL   Calcium 9.0 8.9 - 69.6 mg/dL   GFR, Estimated >78 >93 mL/min    Comment: (NOTE) Calculated using the CKD-EPI Creatinine Equation (2021)    Anion gap 9 5 - 15    Comment: Performed at [facility], [address], [place], [place] [address]   DG Chest 2 View  Result Date: 07/11/2020 CLINICAL DATA:  Recent infiltrate left base. EXAM: CHEST - 2 VIEW COMPARISON:  July 10, 2020 FINDINGS: Ill-defined airspace opacity remains in the left lower lung region. The lungs elsewhere are clear. Heart is upper normal in size with pulmonary vascularity normal. No adenopathy. No bone lesions. IMPRESSION: Ill-defined opacity remains in left lower lobe concerning for pneumonia in this area. Lungs elsewhere clear. Heart upper normal in size. No evident adenopathy. Electronically Signed   By: Bretta Bang III  M.D.   On: 07/11/2020 08:19       Medical Problem List and Plan: 1.  Left hemiparesis, deficits with mobility, transfers, self-care secondary to left basal ganglia/caudate head infarct.  -patient may shower  -ELOS/Goals: 8-12 days/Mod I/Supervision  Admit to CIR 2.  Antithrombotics: -DVT/anticoagulation:  Pharmaceutical: Lovenox  -antiplatelet therapy: DAPT x 3 weeks--d/c ASA 11/10 followed by Plavix alone 3. Pain Management: N/A 4. Mood: LCSW to follow for evaluation and support. Motivated to get better and back to his children.   -antipsychotic agents: N/A 5. Neuropsych: This patient is capable of making decisions on his own behalf. 6. Skin/Wound Care: Routine pressure relief measures.  7. Fluids/Electrolytes/Nutrition: Monitor I/O. Encourage nectar liquids intake.  8. 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 No driving till seizure free X 6 months.  9. Aspiration pneumonia: On cefdinir D #2/7-14. Monitor for fevers/WBC trend 10.  Post stroke dysphagia: On dysphagia 3, nectar liquids. Discontinue IVF encourage fluid intake. Cotinue  11.  Acute blood loss anemia: Will monitor H/H for stability and monitor for signs of bleeding.   CBC ordered for tomorrow a.m. 12.  Obesity  Encourage weight loss  Jacquelynn Cree, PA-C 07/12/2020  I have personally performed a face to face diagnostic evaluation, including, but not limited to relevant history and physical exam findings, of this patient and developed relevant assessment and plan.  Additionally, I have reviewed and concur with the physician assistant's documentation above.  Maryla Morrow, MD, ABPMR  The patient's status has not changed. Any changes from the pre-admission screening or documentation from the acute chart are noted above.   Maryla Morrow, MD, ABPMR

## 2020-07-12 NOTE — TOC Transition Note (Signed)
Transition of Care [facility]) - CM/SW Discharge Note   Patient Details  Name: Mishael Haran MRN: [id number] Date of Birth: 1985-11-13  [facility]) CM/SW Contact:  Kermit Balo, RN Phone Number: 07/12/2020, 3:30 PM   Clinical Narrative:    Pt is discharging to CIR today. CM signing off.   Final next level of care: IP Rehab Facility Barriers to Discharge: Inadequate or no insurance, Barriers Unresolved (comment)   Patient Goals and CMS Choice   CMS Medicare.gov Compare Post Acute Care list provided to:: Patient    Discharge Placement                       Discharge Plan and Services   Discharge Planning Services: CM Consult Post Acute Care Choice: IP Rehab                               Social Determinants of Health (SDOH) Interventions     Readmission Risk Interventions No flowsheet data found.

## 2020-07-12 NOTE — Progress Notes (Signed)
Patient arrived to unit via bed with nurse, tech and wife. Patient adjusting well to unit and alert x4. Use of interpreter was helpful with admission. Patient speaks some [place] and wife speaks little english.

## 2020-07-13 ENCOUNTER — Inpatient Hospital Stay (HOSPITAL_COMMUNITY): Payer: Self-pay | Admitting: Speech Pathology

## 2020-07-13 ENCOUNTER — Inpatient Hospital Stay (HOSPITAL_COMMUNITY): Payer: Self-pay | Admitting: Occupational Therapy

## 2020-07-13 ENCOUNTER — Inpatient Hospital Stay (HOSPITAL_COMMUNITY): Payer: Self-pay

## 2020-07-13 DIAGNOSIS — D62 Acute posthemorrhagic anemia: Secondary | ICD-10-CM

## 2020-07-13 DIAGNOSIS — G40909 Epilepsy, unspecified, not intractable, without status epilepticus: Secondary | ICD-10-CM

## 2020-07-13 DIAGNOSIS — I63511 Cerebral infarction due to unspecified occlusion or stenosis of right middle cerebral artery: Secondary | ICD-10-CM

## 2020-07-13 DIAGNOSIS — J690 Pneumonitis due to inhalation of food and vomit: Secondary | ICD-10-CM

## 2020-07-13 LAB — CBC WITH DIFFERENTIAL/PLATELET
Abs Immature Granulocytes: 0.39 10*3/uL — ABNORMAL HIGH (ref 0.00–0.07)
Basophils Absolute: 0.1 10*3/uL (ref 0.0–0.1)
Basophils Relative: 1 %
Eosinophils Absolute: 0.2 10*3/uL (ref 0.0–0.5)
Eosinophils Relative: 2 %
HCT: 35.2 % — ABNORMAL LOW (ref 39.0–52.0)
Hemoglobin: 12.7 g/dL — ABNORMAL LOW (ref 13.0–17.0)
Immature Granulocytes: 4 %
Lymphocytes Relative: 17 %
Lymphs Abs: 1.5 10*3/uL (ref 0.7–4.0)
MCH: 32.6 pg (ref 26.0–34.0)
MCHC: 36.1 g/dL — ABNORMAL HIGH (ref 30.0–36.0)
MCV: 90.5 fL (ref 80.0–100.0)
Monocytes Absolute: 0.8 10*3/uL (ref 0.1–1.0)
Monocytes Relative: 8 %
Neutro Abs: 6.3 10*3/uL (ref 1.7–7.7)
Neutrophils Relative %: 68 %
Platelets: 373 10*3/uL (ref 150–400)
RBC: 3.89 MIL/uL — ABNORMAL LOW (ref 4.22–5.81)
RDW: 11.3 % — ABNORMAL LOW (ref 11.5–15.5)
WBC: 9.3 10*3/uL (ref 4.0–10.5)
nRBC: 0 % (ref 0.0–0.2)

## 2020-07-13 LAB — COMPREHENSIVE METABOLIC PANEL
ALT: 57 U/L — ABNORMAL HIGH (ref 0–44)
AST: 72 U/L — ABNORMAL HIGH (ref 15–41)
Albumin: 3.3 g/dL — ABNORMAL LOW (ref 3.5–5.0)
Alkaline Phosphatase: 123 U/L (ref 38–126)
Anion gap: 14 (ref 5–15)
BUN: 13 mg/dL (ref 6–20)
CO2: 21 mmol/L — ABNORMAL LOW (ref 22–32)
Calcium: 9.3 mg/dL (ref 8.9–10.3)
Chloride: 102 mmol/L (ref 98–111)
Creatinine, Ser: 0.76 mg/dL (ref 0.61–1.24)
GFR, Estimated: >60 mL/min (ref >60)
Glucose, Bld: 113 mg/dL — ABNORMAL HIGH (ref 70–99)
Potassium: 3.8 mmol/L (ref 3.5–5.1)
Sodium: 137 mmol/L (ref 135–145)
Total Bilirubin: 1 mg/dL (ref 0.3–1.2)
Total Protein: 7.8 g/dL (ref 6.5–8.1)

## 2020-07-13 MED ORDER — ADULT MULTIVITAMIN W/MINERALS CH
1.0000 | ORAL_TABLET | Freq: Every day | ORAL | Status: DC
  Administered 2020-07-14 – 2020-07-22 (×9): 1 via ORAL
  Filled 2020-07-13 (×9): qty 1

## 2020-07-13 NOTE — Evaluation (Signed)
Physical Therapy Assessment and Plan  Patient Details  Name: Mark Oneal MRN: [id number] Date of Birth: 1986-08-10  PT Diagnosis: Difficulty walking and Muscle weakness Rehab Potential:   ELOS:     Today's Date: 07/13/2020 PT Individual Time: 6269-4854 PT Individual Time Calculation (min): 70 min    Hospital Problem: Principal Problem:   Acute ischemic right MCA stroke [facility])   Past Medical History:  Past Medical History:  Diagnosis Date  . History of stroke involving cerebellum 05/2019   Past Surgical History:  Past Surgical History:  Procedure Laterality Date  . BUBBLE STUDY  07/06/2020   Procedure: BUBBLE STUDY;  Surgeon: Donato Heinz, MD;  Location: [facility] ENDOSCOPY;  Service: Cardiovascular;;  . IR ANGIO INTRA EXTRACRAN SEL COM CAROTID INNOMINATE UNI L MOD SED  07/03/2020  . IR CT HEAD LTD  07/03/2020  . IR CT HEAD LTD  07/03/2020  . IR PERCUTANEOUS ART THROMBECTOMY/INFUSION INTRACRANIAL INC DIAG ANGIO  07/03/2020  . IR US GUIDE VASC ACCESS RIGHT  07/03/2020  . RADIOLOGY WITH ANESTHESIA N/A 07/03/2020   Procedure: IR WITH ANESTHESIA;  Surgeon: Radiologist, Medication, MD;  Location: [facility];  Service: Radiology;  Laterality: N/A;  . TEE WITHOUT CARDIOVERSION N/A 07/06/2020   Procedure: TRANSESOPHAGEAL ECHOCARDIOGRAM (TEE);  Surgeon: Donato Heinz, MD;  Location: [facility] ENDOSCOPY;  Service: Cardiovascular;  Laterality: N/A;    Assessment & Plan Clinical Impression: Patient is a Mark Oneal is a 34 year old male with history of prior R-SCA infarct who was admitted on 07/03/2020 after a fall with resulting dysarthria and "jerking" of BUE/BLE.  History taken from chart review and patient.  On evaluation by EMS, he was found to have right gaze deviation and left hemiparesis.  He was loaded with Keppra with recommendations to continue this until clinic follow-up.  UDS negative.  CT head showed hyperdense right MCA sign and CTA head/neck showed occlusion of right-ICA 2  cm of the origin with reconstitution by PCA and subsequent occlusion of proximal right M1 MCA.  He underwent cerebral angiogram with complete revascularization of occluded right ICA and ICA terminus, right MCA and right-ACA with question of proximal right ICA dissection.  Follow-up CT head showed left basal ganglia/caudate head infarct with contrast brain--question bleed and mild cerebral edema.  He was started on hypertonic saline.  EEG showing severe diffuse encephalopathy, negative for seizures he tolerated extubation on 07/04/2020.  Hypercoagulability panel showed slight elevation in ACL IgM.  TEE negative for PFO or cardiac source of emboli.  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  Dr. Leonie Man felt the stroke was embolic due to unknown source versus possible dissection and patient to continue DAPT x3 weeks followed by Plavix alone.  Hospital course further complicated by dysphagia.  MBS done and he has been advanced to dysphagia 3, nectar liquids. Patient has had issues with leukocytosis as well as low-grade fevers with tachypnea likely due to aspiration pneumonia with follow-up chest x-ray showing LLL pneumonia and patient started on cefdinir on 10/26. Patient transferred to CIR on 07/12/2020 .   Patient currently requires supervision with mobility secondary to muscle weakness, decreased cardiorespiratoy endurance and decreased motor planning.  Prior to hospitalization, patient was independent  with mobility and lived with Spouse, Son, Daughter, Family (brother in Sports coach and adult step son) in a House home.  Home access is  Level entry.  Patient will benefit from  skilled PT intervention to maximize safe functional mobility, minimize fall risk and decrease caregiver burden for planned discharge home with intermittent assist.  Anticipate patient will not need PT follow up at discharge.  PT - End  of Session Endurance Deficit: Yes Endurance Deficit Description: pt was very fatigued after session and requested to lay down vs sitting in the wc   PT Evaluation Precautions/Restrictions Precautions Precautions: Fall Restrictions Weight Bearing Restrictions: No Pain Pain Assessment Pain Score: 0-No pain Home Living/Prior Functioning Home Living Living Arrangements: Spouse/significant other;Children Available Help at Discharge: Family;Available 24 hours/day Type of Home: House Home Access: Level entry Home Layout: One level Bathroom Shower/Tub: Tub/shower unit;Curtain Biochemist, clinical: Standard  Lives With: Spouse;Son;Daughter;Family (brother in law and adult step son) Prior Function Level of Independence: Independent with basic ADLs;Independent with homemaking with ambulation;Independent with gait;Independent with transfers  Able to Take Stairs?: Yes Driving: No Vocation: Full time employment Vocation Requirements: Architect Vision/Perception  Vision - Assessment Eye Alignment: Within Designer, television/film set Perception: Impaired Inattention/Neglect: Does not attend to left visual field Praxis Praxis: Impaired Praxis Impairment Details: Motor planning  Cognition Orientation Level: Oriented X4 Immediate Memory Recall: Sock;Blue;Bed Memory Recall Sock: Without Cue Memory Recall Blue: Without Cue Memory Recall Bed: Without Cue Sensation Sensation Light Touch: Appears Intact Hot/Cold: Appears Intact Proprioception: Appears Intact Stereognosis: Impaired by gross assessment Coordination Gross Motor Movements are Fluid and Coordinated: No Fine Motor Movements are Fluid and Coordinated: No Coordination and Movement Description: limited functional use of LUE Finger Nose Finger Test: unable to touch nose with L hand Motor  Motor Motor: Hemiplegia Motor - Skilled Clinical Observations: LUE more involved than L LE   Trunk/Postural Assessment  Cervical  Assessment Cervical Assessment: Within Functional Limits Thoracic Assessment Thoracic Assessment: Within Functional Limits Lumbar Assessment Lumbar Assessment: Within Functional Limits Postural Control Postural Control: Within Functional Limits  Balance Balance Balance Assessed: Yes Standardized Balance Assessment Standardized Balance Assessment: Berg Balance Test;Timed Up and Go Test Berg Balance Test Sit to Stand: Able to stand without using hands and stabilize independently Standing Unsupported: Able to stand safely 2 minutes Sitting with Back Unsupported but Feet Supported on Floor or Stool: Able to sit safely and securely 2 minutes Stand to Sit: Sits safely with minimal use of hands Transfers: Able to transfer safely, minor use of hands Standing Unsupported with Eyes Closed: Able to stand 10 seconds safely Standing Ubsupported with Feet Together: Able to place feet together independently and stand 1 minute safely From Standing, Reach Forward with Outstretched Arm: Reaches forward but needs supervision From Standing Position, Pick up Object from Floor: Able to pick up shoe safely and easily From Standing Position, Turn to Look Behind Over each Shoulder: Turn sideways only but maintains balance Turn 360 Degrees: Able to turn 360 degrees safely but slowly Standing Unsupported, Alternately Place Feet on Step/Stool: Able to complete >2 steps/needs minimal assist Standing Unsupported, One Foot in Front: Loses balance while stepping or standing Standing on One Leg: Able to lift leg independently and hold equal to or more than 3 seconds Total Score: 40 Timed Up and Go Test TUG: Normal TUG Normal TUG (seconds): 12.4 Dynamic Sitting Balance Dynamic Sitting - Level of Assistance: 5: Stand by assistance Static Standing Balance Static Standing - Level of Assistance: 5: Stand by assistance Dynamic Standing Balance Dynamic Standing - Level of Assistance: 4: Min assist Extremity Assessment   RUE Assessment RUE Assessment: Within Functional Limits LUE Assessment Passive Range of Motion (PROM) Comments: [facility] Active Range of  Motion (AROM) Comments: sh flex 75 degrees, full elbow, wrist and finger AROM General Strength Comments: elbow flex/ext 3+/5, grasp 4-/5, shoulder 2-/5 RLE Assessment RLE Assessment: Within Functional Limits LLE Assessment General Strength Comments: Grossly 4/5  Care Tool Care Tool Bed Mobility Roll left and right activity   Roll left and right assist level: Supervision/Verbal cueing    Sit to lying activity   Sit to lying assist level: Supervision/Verbal cueing    Lying to sitting edge of bed activity   Lying to sitting edge of bed assist level: Supervision/Verbal cueing     Care Tool Transfers Sit to stand transfer   Sit to stand assist level: Contact Guard/Touching assist    Chair/bed transfer   Chair/bed transfer assist level: Contact Guard/Touching assist     Toilet transfer   Assist Level: Contact Guard/Touching assist    Car transfer   Car transfer assist level: Contact Guard/Touching assist      Care Tool Locomotion Ambulation   Assist level: Contact Guard/Touching assist Assistive device: No Device Max distance: 150'  Walk 10 feet activity   Assist level: Contact Guard/Touching assist Assistive device: No Device   Walk 50 feet with 2 turns activity   Assist level: Contact Guard/Touching assist Assistive device: No Device  Walk 150 feet activity   Assist level: Contact Guard/Touching assist Assistive device: No Device  Walk 10 feet on uneven surfaces activity   Assist level: Contact Guard/Touching assist    Stairs   Assist level: Contact Guard/Touching assist Stairs assistive device: 2 hand rails Max number of stairs: 12  Walk up/down 1 step activity   Walk up/down 1 step (curb) assist level: Contact Guard/Touching assist Walk up/down 1 step or curb assistive device: 2 hand rails    Walk up/down 4 steps activity  Walk up/down 4 steps assist level: Contact Guard/Touching assist Walk up/down 4 steps assistive device: 2 hand rails  Walk up/down 12 steps activity   Walk up/down 12 steps assist level: Contact Guard/Touching assist Walk up/down 12 steps assistive device: 2 hand rails  Pick up small objects from floor   Pick up small object from the floor assist level: Contact Guard/Touching assist    Wheelchair Will patient use wheelchair at discharge?: No          Wheel 50 feet with 2 turns activity      Wheel 150 feet activity        Refer to Care Plan for Long Term Goals  SHORT TERM GOAL WEEK 1 PT Short Term Goal 1 (Week 1): STgs = LTGs due to ELOS  Recommendations for other services: None   Skilled Therapeutic Intervention  Evaluation completed (see details above and below) with education on PT POC and goals and individual treatment initiated with focus on balance, ambulation, multitasking, and functional transfers.  Pt received supine in bed and agrees to therapy. No complaint of pain. Supine to sit with supervision and cues for sequencing. Stand pivot transfer to [facility] with CGA.   Pt performs 150' ambulation without AD and with CGA, demonstrating decreased gait speed and stability, but not LOBs. Pt completes x12 6" steps with BHRs and CGA, with verbal cues on sequencing and safety.  Pt performs 6 minute walk test with CGA and ambulates 1061'. Pt verbalizes fatigue following ambulation. PT notes that toward end of test pt begins exhibiting more frequent toe drags and decreased gait speed.  Pt completes TUG and BERG, as detailed in evaluation section. Pt perform sit to supine  with supervision. Left supine in bed with alarm intact and all needs within reach.   Mobility Bed Mobility Bed Mobility: Supine to Sit;Sit to Supine Supine to Sit: Supervision/Verbal cueing Sit to Supine: Supervision/Verbal cueing Transfers Transfers: Sit to Stand;Stand to Sit;Stand Pivot Transfers Sit to Stand:  Contact Guard/Touching assist Stand to Sit: Contact Guard/Touching assist Stand Pivot Transfers: Contact Guard/Touching assist Transfer (Assistive device): None Locomotion  Gait Ambulation: Yes Gait Assistance: Contact Guard/Touching assist Gait Distance (Feet): 1060 Feet Assistive device: None Gait Gait: Yes Gait Pattern: Impaired Gait Pattern:  ([place] toe drag on L) Gait velocity: reduced Stairs / Additional Locomotion Stairs: Yes Stairs Assistance: Contact Guard/Touching assist Stair Management Technique: Two rails Number of Stairs: 12 Height of Stairs: 6 Ramp: Contact Guard/touching assist Curb: Contact Guard/Touching assist Wheelchair Mobility Wheelchair Mobility: No   Discharge Criteria: Patient will be discharged from PT if patient refuses treatment 3 consecutive times without medical reason, if treatment goals not met, if there is a change in medical status, if patient makes no progress towards goals or if patient is discharged from hospital.  The above assessment, treatment plan, treatment alternatives and goals were discussed and mutually agreed upon: by patient  Breck Coons, PT, DPT 07/13/2020, 3:50 PM

## 2020-07-13 NOTE — Evaluation (Signed)
Occupational Therapy Assessment and Plan  Patient Details  Name: Mark Oneal MRN: [id number] Date of Birth: Aug 14, 1986  OT Diagnosis: apraxia, hemiplegia affecting dominant side and muscle weakness (generalized) Rehab Potential: Rehab Potential (ACUTE ONLY): Excellent ELOS: 7-9 days   Today's Date: 07/13/2020 OT Individual Time: 1030-1130 OT Individual Time Calculation (min): 60 min     Hospital Problem: Principal Problem:   Acute ischemic right MCA stroke [facility])   Past Medical History:  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   Past Surgical History:  Past Surgical History:  Procedure Laterality Date   BUBBLE STUDY  07/06/2020   Procedure: BUBBLE STUDY;  Surgeon: Donato Heinz, MD;  Location: [place];  Service: Cardiovascular;;   IR ANGIO INTRA EXTRACRAN SEL COM CAROTID INNOMINATE UNI L MOD SED  07/03/2020   IR CT HEAD LTD  07/03/2020   IR CT HEAD LTD  07/03/2020   IR PERCUTANEOUS ART THROMBECTOMY/INFUSION INTRACRANIAL INC DIAG ANGIO  07/03/2020   IR US GUIDE VASC ACCESS RIGHT  07/03/2020   RADIOLOGY WITH ANESTHESIA N/A 07/03/2020   Procedure: IR WITH ANESTHESIA;  Surgeon: Radiologist, Medication, MD;  Location: [place];  Service: Radiology;  Laterality: N/A;   TEE WITHOUT CARDIOVERSION N/A 07/06/2020   Procedure: TRANSESOPHAGEAL ECHOCARDIOGRAM (TEE);  Surgeon: Donato Heinz, MD;  Location: [facility] ENDOSCOPY;  Service: Cardiovascular;  Laterality: N/A;    Assessment & Plan Clinical Impression: Mark Oneal is a 34 year old male with history of prior R-SCA infarct who was admitted on 07/03/2020 after a fall with resulting dysarthria and "jerking" of BUE/BLE.  History taken from chart review and patient.  On evaluation by EMS, he was found to have right gaze deviation and left hemiparesis.  He was loaded with Keppra with recommendations to continue this until clinic follow-up.  UDS negative.  CT head showed  hyperdense right MCA sign and CTA head/neck showed occlusion of right-ICA 2 cm of the origin with reconstitution by PCA and subsequent occlusion of proximal right M1 MCA.  He underwent cerebral angiogram with complete revascularization of occluded right ICA and ICA terminus, right MCA and right-ACA with question of proximal right ICA dissection.  Follow-up CT head showed left basal ganglia/caudate head infarct with contrast brain--question bleed and mild cerebral edema.  He was started on hypertonic saline.  EEG showing severe diffuse encephalopathy, negative for seizures he tolerated extubation on 07/04/2020.   Hypercoagulability panel showed slight elevation in ACL IgM.  TEE negative for PFO or cardiac source of emboli.  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  Dr. Leonie Man felt the stroke was embolic due to unknown source versus possible dissection and patient to continue DAPT x3 weeks followed by Plavix alone.  Hospital course further complicated by dysphagia.  MBS done and he has been advanced to dysphagia 3, nectar liquids. Patient has had issues with leukocytosis as well as low-grade fevers with tachypnea likely due to aspiration pneumonia with follow-up chest x-ray showing LLL pneumonia and patient started on cefdinir on 10/26.      Patient transferred to CIR on 07/12/2020 .    Patient currently requires min with basic self-care skills secondary to muscle weakness, decreased cardiorespiratoy endurance, unbalanced muscle activation and decreased motor planning, decreased attention to left, decreased awareness, decreased problem solving and decreased memory and decreased standing balance, hemiplegia and decreased balance strategies.  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  Patient will benefit from skilled intervention to increase independence  with basic self-care skills prior to discharge home with care partner.  Anticipate patient will require intermittent supervision and follow up outpatient.  OT - End of Session Endurance Deficit: Yes Endurance Deficit Description: pt was very fatigued after session and requested to lay down vs sitting in the wc OT Assessment Rehab Potential (ACUTE ONLY): Excellent OT Patient demonstrates impairments in the following area(s): Balance;Endurance;Motor;Perception OT Basic ADL's Functional Problem(s): Eating;Grooming;Bathing;Dressing;Toileting OT Transfers Functional Problem(s): Toilet;Tub/Shower OT Additional Impairment(s): Fuctional Use of Upper Extremity OT Plan OT Intensity: Minimum of 1-2 x/day, 45 to 90 minutes OT Frequency: 5 out of 7 days OT Duration/Estimated Length of Stay: 7-9 days OT Treatment/Interventions: Balance/vestibular training;Discharge planning;Functional mobility training;DME/adaptive equipment instruction;Cognitive remediation/compensation;Neuromuscular re-education;Patient/family education;Psychosocial support;Therapeutic Activities;Self Care/advanced ADL retraining;Therapeutic Exercise;UE/LE Strength taining/ROM;UE/LE Coordination activities;Visual/perceptual remediation/compensation OT Self Feeding Anticipated Outcome(s): independent OT Basic Self-Care Anticipated Outcome(s): mod I OT Toileting Anticipated Outcome(s): mod I OT Bathroom Transfers Anticipated Outcome(s): mod I to toilet; Supervision to shower OT Recommendation Patient destination: Home Follow Up Recommendations: Outpatient OT Equipment Recommended: Tub/shower bench   OT Evaluation Precautions/Restrictions  Precautions Precautions: Fall   Pain Pain Assessment Pain Scale: Faces Pain Score: 0-No pain Home Living/Prior Functioning Home Living Family/patient expects to be discharged to:: Private residence Living Arrangements: Spouse/significant other, Children Available Help at Discharge: Family,  Available 24 hours/day Type of Home: House Home Access: Level entry Home Layout: One level Bathroom Shower/Tub: Tub/shower unit, Architectural technologist: Standard  Lives With: Spouse, Son, Daughter, Family (brother in Sports coach and adult step son) Prior Function Level of Independence: Independent with basic ADLs, Independent with homemaking with ambulation, Independent with gait, Independent with transfers  Able to Take Stairs?: Yes Driving: No Vocation: Full time employment Vocation Requirements: Education officer, community Baseline Vision/History: No visual deficits Patient Visual Report: No change from baseline Vision Assessment?: No apparent visual deficits Eye Alignment: Within Functional Limits Perception  Perception: Impaired Inattention/Neglect: Does not attend to left visual field Praxis Praxis: Impaired Praxis Impairment Details: Motor planning Cognition Overall Cognitive Status: Impaired/Different from baseline Arousal/Alertness: Awake/alert Orientation Level: Person;Place;Situation Person: Oriented Place: Oriented Situation: Oriented Year: 2021 Month: October Day of Week: Correct Memory: Appears intact Immediate Memory Recall: Sock;Blue;Bed Memory Recall Sock: Without Cue Memory Recall Blue: Without Cue Memory Recall Bed: Without Cue Attention: Sustained Sustained Attention: Impaired Sustained Attention Impairment: Functional basic Awareness: Impaired Awareness Impairment: Emergent impairment;Anticipatory impairment Problem Solving: Impaired Problem Solving Impairment: Functional basic;Verbal basic Behaviors: Restless (slightly physically restless) Safety/Judgment: Impaired Sensation Sensation Light Touch: Appears Intact Hot/Cold: Appears Intact Proprioception: Appears Intact Stereognosis: Impaired by gross assessment Coordination Gross Motor Movements are Fluid and Coordinated: No Fine Motor Movements are Fluid and Coordinated: No Coordination and Movement  Description: limited functional use of LUE Finger Nose Finger Test: unable to touch nose with L hand Motor  Motor Motor: Hemiplegia Motor - Skilled Clinical Observations: LUE more involved than L LE  Trunk/Postural Assessment  Cervical Assessment Cervical Assessment: Within Functional Limits Thoracic Assessment Thoracic Assessment: Within Functional Limits Lumbar Assessment Lumbar Assessment: Within Functional Limits Postural Control Postural Control: Within Functional Limits  Balance Dynamic Sitting Balance Dynamic Sitting - Level of Assistance: 5: Stand by assistance Static Standing Balance Static Standing - Level of Assistance: 5: Stand by assistance Dynamic Standing Balance Dynamic Standing - Level of Assistance: 4: Min assist Extremity/Trunk Assessment RUE Assessment RUE Assessment: Within Functional Limits LUE Assessment Passive Range of  Motion (PROM) Comments: WFL Active Range of Motion (AROM) Comments: sh flex 75 degrees, full elbow, wrist and finger AROM General Strength Comments: elbow flex/ext 3+/5, grasp 4-/5, shoulder 2-/5  Care Tool Care Tool Self Care Eating   Eating Assist Level: Minimal Assistance - Patient > 75% (cueing for swallow strategies and attention to intake)    Oral Care    Oral Care Assist Level: Set up assist    Bathing   Body parts bathed by patient: Left arm;Chest;Abdomen;Front perineal area;Buttocks;Right upper leg;Left upper leg Body parts bathed by helper: Right arm;Right lower leg;Left lower leg;Face   Assist Level: Minimal Assistance - Patient > 75%    Upper Body Dressing(including orthotics)   What is the patient wearing?: Pull over shirt   Assist Level: Minimal Assistance - Patient > 75%    Lower Body Dressing (excluding footwear)   What is the patient wearing?: Underwear/pull up;Pants Assist for lower body dressing: Minimal Assistance - Patient > 75%    Putting on/Taking off footwear   What is the patient wearing?:  Socks;Shoes Assist for footwear: Moderate Assistance - Patient 50 - 74%       Care Tool Toileting Toileting activity   Assist for toileting: Contact Guard/Touching assist     Care Tool Bed Mobility Roll left and right activity   Roll left and right assist level: Contact Guard/Touching assist    Sit to lying activity        Lying to sitting edge of bed activity   Lying to sitting edge of bed assist level: Minimal Assistance - Patient > 75% (with no rails)     Care Tool Transfers Sit to stand transfer   Sit to stand assist level: Contact Guard/Touching assist    Chair/bed transfer   Chair/bed transfer assist level: Contact Guard/Touching assist     Toilet transfer   Assist Level: Contact Guard/Touching assist     Care Tool Cognition Expression of Ideas and Wants Expression of Ideas and Wants: Without difficulty (complex and basic) - expresses complex messages without difficulty and with speech that is clear and easy to understand   Understanding Verbal and Non-Verbal Content Understanding Verbal and Non-Verbal Content: Usually understands - understands most conversations, but misses some part/intent of message. Requires cues at times to understand   Memory/Recall Ability *first 3 days only Memory/Recall Ability *first 3 days only: Current season;That he or she is in a hospital/hospital unit    Refer to Care Plan for [place] 1 OT Short Term Goal 1 (Week 1): STGS = LTGs  Recommendations for other services: None    Skilled Therapeutic Intervention ADL ADL Grooming: Supervision/safety Upper Body Bathing: Minimal assistance Where Assessed-Upper Body Bathing: Shower Lower Body Bathing: Minimal assistance Where Assessed-Lower Body Bathing: Shower Upper Body Dressing: Minimal assistance Where Assessed-Upper Body Dressing: Edge of bed Lower Body Dressing: Moderate assistance Where Assessed-Lower Body Dressing: Edge of bed Toileting: Minimal  assistance Where Assessed-Toileting: Glass blower/designer: Therapist, music Method: Product/process development scientist Method: Heritage manager: Network engineer bench;Grab bars Mobility  Transfers Sit to Stand: Contact Guard/Touching assist Stand to Sit: Contact Guard/Touching assist    Pt seen for initial evaluation and ADL training with a focus on pt's awareness of his LUE, balance, and use of his L hand.  Pt received in bed and agreeable to a shower.  Pt was able to sit to EOB using rails with CGA. He  can stand up with only CGA. With min A for balance pt was able to march in place and lift alternating knees up.  Pt then walked to bathroom with CGA HHA with support on L side.  Pt sat down on toilet and stood with CGA and doffed shorts from standing, stepping out of shorts.  Transferred to shower. Min A to wash R UE and feet as bench was set too high for him to balance for reaching forward.   He returned to sit to EOB to dress with min for UB and mod LB. His shoes are too tight and difficult to put on. He will ask his family to bring in new shoes.    Pt participated in MMT, ROM testing, coordination testing. With some of these activities, pt demonstrated apraxia with his RUE.  I dont think this was a language barrier but difficulty following directions.  Mild apraxia noted as he was learning hemidressing techniques.    Pt participated well, but fatigued at end of session. Requested to lay in bed. Pt resting in bed with all needs met.  Alarm set and call light in reach.   Discharge Criteria: Patient will be discharged from OT if patient refuses treatment 3 consecutive times without medical reason, if treatment goals not met, if there is a change in medical status, if patient makes no progress towards goals or if patient is discharged from hospital.  The above assessment, treatment plan, treatment alternatives and goals were  discussed and mutually agreed upon: by patient  [facility] 07/13/2020, 12:40 PM

## 2020-07-13 NOTE — Progress Notes (Signed)
[place] PHYSICAL MEDICINE & REHABILITATION PROGRESS NOTE   Subjective/Complaints: Pt up in bed. No new complaints. Denies pain. Slept well. Left side weak  ROS: Patient denies fever, rash, sore throat, blurred vision, nausea, vomiting, diarrhea, cough, shortness of breath or chest pain, joint or back pain, headache, or mood change.    Objective:   No results found. Recent Labs    07/11/20 0457 07/13/20 1037  WBC 12.3* 9.3  HGB 11.6* 12.7*  HCT 33.2* 35.2*  PLT 259 373   Recent Labs    07/11/20 0457  NA 135  K 3.8  CL 100  CO2 26  GLUCOSE 111*  BUN 16  CREATININE 0.92  CALCIUM 9.0    Intake/Output Summary (Last 24 hours) at 07/13/2020 1121 Last data filed at 07/13/2020 0830 Gross per 24 hour  Intake --  Output 1075 ml  Net -1075 ml        Physical Exam: Vital Signs Blood pressure 102/66, pulse 76, temperature 98.3 F (36.8 C), resp. rate 18, height 5\' 4"  (1.626 m), weight 83.8 kg, SpO2 100 %.  General: Alert and oriented x 3, No apparent distress HEENT: Head is normocephalic, atraumatic, PERRLA, EOMI, sclera anicteric, oral mucosa pink and moist, dentition intact, ext ear canals clear,  Neck: Supple without JVD or lymphadenopathy Heart: Reg rate and rhythm. No murmurs rubs or gallops Chest: CTA bilaterally without wheezes, rales, or rhonchi; no distress Abdomen: Soft, non-tender, non-distended, bowel sounds positive. Extremities: No clubbing, cyanosis, or edema. Pulses are 2+ Skin: Clean and intact without signs of breakdown Neuro: ?mild expressive deficits vs cultural/language. Reasonable insight and awareness. Very alert. Mild dysarthria  Cranial nerves 2-12 are intact except for mild left 7. Sensory exam is normal. Reflexes are 1-2+ in all 4's. Fine motor coordination is intact. No tremors.  LUE 4-/5 prox to distal. LLE 4- to 4/5 prox to distal. RUE and RLE 5/5.  Musculoskeletal: Full ROM, No pain with AROM or PROM in the neck, trunk, or extremities.  Posture appropriate Psych: Pt's affect is appropriate. Pt is cooperative     Assessment/Plan: 1. Functional deficits secondary to right MCA infarct which require 3+ hours per day of interdisciplinary therapy in a comprehensive inpatient rehab setting.  Physiatrist is providing close team supervision and 24 hour management of active medical problems listed below.  Physiatrist and rehab team continue to assess barriers to discharge/monitor patient progress toward functional and medical goals  Care Tool:  Bathing              Bathing assist       Upper Body Dressing/Undressing Upper body dressing        Upper body assist      Lower Body Dressing/Undressing Lower body dressing            Lower body assist       Toileting Toileting    Toileting assist Assist for toileting: Independent with assistive device Assistive Device Comment: used urinal   Transfers Chair/bed transfer  Transfers assist           Locomotion Ambulation   Ambulation assist              Walk 10 feet activity   Assist           Walk 50 feet activity   Assist           Walk 150 feet activity   Assist           Walk 10 feet  on uneven surface  activity   Assist           Wheelchair     Assist               Wheelchair 50 feet with 2 turns activity    Assist            Wheelchair 150 feet activity     Assist          Blood pressure 102/66, pulse 76, temperature 98.3 F (36.8 C), resp. rate 18, height 5\' 4"  (1.626 m), weight 83.8 kg, SpO2 100 %.  Medical Problem List and Plan: 1.  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 Stroke effected right basal ganglia, insula and anterior temporal lobe           -patient may shower             -ELOS/Goals: 8-12 days/Mod I/Supervision             Admit to CIR 2.   Antithrombotics: -DVT/anticoagulation:  Pharmaceutical: Lovenox             -antiplatelet therapy: DAPT x 3 weeks--d/c ASA 11/10 followed by Plavix alone 3. Pain Management: N/A 4. Mood: LCSW to follow for evaluation and support. Motivated to get better and back to his children.              -antipsychotic agents: N/A 5. Neuropsych: This patient is capable of making decisions on his own behalf. 6. Skin/Wound Care: Routine pressure relief measures.  7. Fluids/Electrolytes/Nutrition: Monitor I/O. Encourage nectar liquids intake.   -today's labs pending 8. 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 No driving till seizure free X 6 months.  9. Aspiration pneumonia: On cefdinir D #3/7-14. Monitor for fevers/WBC trend  -afebrile, wbc's down to 9 on 10/28 10.  Post stroke dysphagia: On dysphagia 3, nectar liquids. Discontinued IVF encourage fluid intake.   -continue current diet 11.  Acute blood loss anemia: Will monitor H/H for stability and monitor for signs of bleeding.              hgb up to 12.7 10/28 12.  Obesity             Encourage weight loss    LOS: 1 days A FACE TO FACE EVALUATION WAS PERFORMED  11/28 07/13/2020, 11:21 AM

## 2020-07-13 NOTE — Progress Notes (Addendum)
Patient Details  Name: Mark Oneal MRN: [id number] Date of Birth: 02-03-1986  Today's Date: 07/13/2020  Hospital Problems: Principal Problem:   Acute ischemic right MCA stroke [facility])  Past Medical History:  Past Medical History:  Diagnosis Date  . History of stroke involving cerebellum 05/2019   Past Surgical History:  Past Surgical History:  Procedure Laterality Date  . BUBBLE STUDY  07/06/2020   Procedure: BUBBLE STUDY;  Surgeon: Donato Heinz, MD;  Location: [facility] ENDOSCOPY;  Service: Cardiovascular;;  . IR ANGIO INTRA EXTRACRAN SEL COM CAROTID INNOMINATE UNI L MOD SED  07/03/2020  . IR CT HEAD LTD  07/03/2020  . IR CT HEAD LTD  07/03/2020  . IR PERCUTANEOUS ART THROMBECTOMY/INFUSION INTRACRANIAL INC DIAG ANGIO  07/03/2020  . IR US GUIDE VASC ACCESS RIGHT  07/03/2020  . RADIOLOGY WITH ANESTHESIA N/A 07/03/2020   Procedure: IR WITH ANESTHESIA;  Surgeon: Radiologist, Medication, MD;  Location: [place];  Service: Radiology;  Laterality: N/A;  . TEE WITHOUT CARDIOVERSION N/A 07/06/2020   Procedure: TRANSESOPHAGEAL ECHOCARDIOGRAM (TEE);  Surgeon: Donato Heinz, MD;  Location: [facility] ENDOSCOPY;  Service: Cardiovascular;  Laterality: N/A;   Social History:  reports that he has quit smoking. His smoking use included cigarettes. He has never used smokeless tobacco. He reports current alcohol use of about 3.0 standard drinks of alcohol per week. No history on file for drug use.  Family / Support Systems Marital Status: Married How Long?: 8 years Patient Roles: Spouse, Parent Spouse/Significant Other: Mark Oneal [phone]) Children: 32 yr old dtr adn 90 yr old son (austic) Other Supports: None reported Anticipated Caregiver: Wife Ability/Limitations of Caregiver: Wife cares for their 2 y.o. 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 Recent Psychosocial Issues: Denies Psychiatric History: Denies Substance Abuse History: Denies  Patient / Family Perceptions, Expectations & Goals Pt/Family understanding of illness & functional limitations: Pt has general understanding of care needs Premorbid pt/family roles/activities: Independent Anticipated changes in roles/activities/participation: Assistance with ADLs/IADLs  Community Resources Express Scripts: None Premorbid Home Care/DME Agencies: None Transportation available at discharge: wife Resource referrals recommended: Neuropsychology  Discharge Planning Living Arrangements: Spouse/significant other, Children Support Systems: Children, Spouse/significant other Type of Residence: Private residence Insurance Resources: Teacher, adult education Resources: Employment Financial Screen Referred: Yes Living Expenses: Rent Money Management: Patient Does the patient have any problems obtaining your medications?: No Home Management: Wife manages all homecare needs Care Coordinator Barriers to Discharge: Other (comments) Care Coordinator Barriers to Discharge Comments: Pt is uninsured Care Coordinator Anticipated Follow Up Needs: HH/OP  Clinical Impression SW met with pt in room at bedside with [employer] Spanish Interpreter [phone] to introduce self, explain role, and discuss discharge process. Pt states he will have help from his  stepson and brother who both live in the home and are adults (35 y.o.) and able to assist if needed. Pt is not a English as a second language teacher. No HCPOA. No DME. Pt aware SW will follow-up  with his wife Engineering geologist. SW waiting on updates from Mark Oneal/[employer] [phone]) on status of Medicaid application.   Mark Oneal 07/13/2020, 2:07 PM

## 2020-07-13 NOTE — Plan of Care (Signed)
°  Problem: Consults Goal: RH STROKE PATIENT EDUCATION Description: See Patient Education module for education specifics  Outcome: Progressing   Problem: RH SAFETY Goal: RH STG ADHERE TO SAFETY PRECAUTIONS W/ASSISTANCE/DEVICE Description: STG Adhere to Safety Precautions With Assistance/Device. Mod I Outcome: Progressing   Problem: RH KNOWLEDGE DEFICIT Goal: RH STG INCREASE KNOWLEDGE OF STROKE PROPHYLAXIS Description: Patient, family will be able to verbalize prevention of stroke including medication, diet, exercise, and lifestyle changes with cues, handout Outcome: Progressing

## 2020-07-13 NOTE — Progress Notes (Signed)
Initial Nutrition Assessment  DOCUMENTATION CODES:   Obesity unspecified  INTERVENTION:   - Magic cup BID with meals, each supplement provides 290 kcal and 9 grams of protein  - PM snack daily  - MVI with minerals daily  NUTRITION DIAGNOSIS:   Increased nutrient needs related to other (therapies) as evidenced by estimated needs.  GOAL:   Patient will meet greater than or equal to 90% of their needs  MONITOR:   PO intake, Supplement acceptance, Diet advancement, Labs, Weight trends, Skin  REASON FOR ASSESSMENT:   Malnutrition Screening Tool    ASSESSMENT:   34 year old male who was admitted on 07/03/20 after a fall with resulting dysarthria and "jerking" of BUE/BLE. CT head showed hyperdense right MCA sign and CTA head/neck showed occlusion of right-CA 2 cm of the origin with reconstitution by PCA and subsequent occlusion of proximal right M1 MCA. Pt underwent cerebral angiogram with complete revascularization of occluded right ICA and ICA terminus, right MCA and right-ACA with question of proximal right ICA dissection. Pt admitted to CIR on 10/27.   No meal completions recorded at this time.  Spoke with pt at bedside utilizing iPad interpretor services [facility] [phone]). Pt reports his appetite is "very good" and it is "normal." He states that he ate all of his lunch. Pt endorses eating well PTA.  Pt denies any recent weight changes. He reports his UBW as 155 lbs. Most recent weight in chart is 184.75 lbs. Will continue to monitor.  Pt requesting additional food because he is always hungry. Added Magic Cups to lunch and dinner meals. Also added PM snack. Pt appreciative.  Medications reviewed and include: colace, protonix, senna  Labs reviewed: elevated LFTs  NUTRITION - FOCUSED PHYSICAL EXAM:    Most Recent Value  Orbital Region No depletion  Upper Arm Region No depletion  Thoracic and Lumbar Region No depletion  Buccal Region No depletion  Temple Region No  depletion  Clavicle Bone Region No depletion  Clavicle and Acromion Bone Region No depletion  Scapular Bone Region No depletion  Dorsal Hand No depletion  Patellar Region No depletion  Anterior Thigh Region No depletion  Posterior Calf Region No depletion  Edema (RD Assessment) None  Hair Reviewed  Eyes Reviewed  Mouth Reviewed  Skin Reviewed  Nails Reviewed       Diet Order:   Diet Order            DIET DYS 3 Room service appropriate? Yes with Assist; Fluid consistency: Nectar Thick  Diet effective now                 EDUCATION NEEDS:   Education needs have been addressed  Skin:  Skin Assessment: Skin Integrity Issues: Incisions: groin  Last BM:  07/13/20  Height:   Ht Readings from Last 1 Encounters:  07/12/20 5\' 4"  (1.626 m)    Weight:   Wt Readings from Last 1 Encounters:  07/12/20 83.8 kg    Ideal Body Weight:  59.1 kg  BMI:  Body mass index is 31.71 kg/m.  Estimated Nutritional Needs:   Kcal:  2100-2300  Protein:  100-120 grams  Fluid:  >/= 2.0 L    07/14/20, MS, RD, LDN Inpatient Clinical Dietitian Please see [facility] for contact information.

## 2020-07-14 ENCOUNTER — Inpatient Hospital Stay (HOSPITAL_COMMUNITY): Payer: Self-pay | Admitting: Occupational Therapy

## 2020-07-14 ENCOUNTER — Inpatient Hospital Stay (HOSPITAL_COMMUNITY): Payer: Self-pay

## 2020-07-14 ENCOUNTER — Inpatient Hospital Stay (HOSPITAL_COMMUNITY): Payer: Self-pay | Admitting: Speech Pathology

## 2020-07-14 MED ORDER — CHLORHEXIDINE GLUCONATE 0.12 % MT SOLN
OROMUCOSAL | Status: AC
  Administered 2020-07-14: 15 mL via OROMUCOSAL
  Filled 2020-07-14: qty 15

## 2020-07-14 NOTE — Progress Notes (Signed)
Speech Language Pathology Daily Session Note  Patient Details  Name: Mark Oneal MRN: [id number] Date of Birth: 24-Apr-1986  Today's Date: 07/14/2020 SLP Individual Time: 0805-0900 SLP Individual Time Calculation (min): 55 min  Short Term Goals: Week 1: SLP Short Term Goal 1 (Week 1): STG=LTG due to short estimated length of stay  Skilled Therapeutic Interventions:  Pt was seen for skilled ST targeting goals for dysphagia and cognition.  Pt consumed presentations of his currently prescribed diet during breakfast with supervision cues for use of swallowing precautions and intermittent delayed throat clearing with large sips of nectar thick liquids.  SLP facilitated the session with therapeutic trials of thin liquids following oral care to continue working towards diet progression.  While completing oral care at the sink, pt needed min verbal cues to utilize and attend to items on the left side of the sink, including turning off the hot water when finishing brushing his teeth.  Pt consumed ~4 small sips of water with intermittent delayed throat clearing.  Recommend repeat MBS prior to discharge give presence of silent aspiration on last instrumental assessment.  Subjectively, pt's attention to boluses, mastication of solids, and anterior containment of boluses appears improved in comparison to reports of performance on 10/20 MBS which could be a positive prognostic indicator of readiness to advance.  SLP also facilitated the session with medication management tasks to address .  Pt was aware of 2 medications that he was currently taking ("aspirin" and "cholesterol") but verbalized he was unfamiliar with many of his new medications.  SLP provided pt with a list of his currently scheduled medications, their functions, and frequencies in Spanish to maximize carryover in between therapy sessions.  I would suggest practicing loading a pill box during next targeted therapy session.    Pain Pain  Assessment Pain Scale: 0-10 Pain Score: 0-No pain  Therapy/Group: Individual Therapy  Eufemia Prindle, Melanee Spry 07/14/2020, 12:52 PM

## 2020-07-14 NOTE — Progress Notes (Signed)
[facility] PHYSICAL MEDICINE & REHABILITATION PROGRESS NOTE   Subjective/Complaints: Up in bed eating breakfast. No new issues. Had a good day with therapy. Happy with progress  ROS: Patient denies fever, rash, sore throat, blurred vision, nausea, vomiting, diarrhea, cough, shortness of breath or chest pain, joint or back pain, headache, or mood change.   Objective:   No results found. Recent Labs    07/13/20 1037  WBC 9.3  HGB 12.7*  HCT 35.2*  PLT 373   Recent Labs    07/13/20 1037  NA 137  K 3.8  CL 102  CO2 21*  GLUCOSE 113*  BUN 13  CREATININE 0.76  CALCIUM 9.3    Intake/Output Summary (Last 24 hours) at 07/14/2020 1007 Last data filed at 07/14/2020 0915 Gross per 24 hour  Intake 400 ml  Output 400 ml  Net 0 ml        Physical Exam: Vital Signs Blood pressure 105/71, pulse 63, temperature 98.3 F (36.8 C), temperature source Oral, resp. rate 18, height 5\' 4"  (1.626 m), weight 81.3 kg, SpO2 98 %.  Constitutional: No distress . Vital signs reviewed. HEENT: EOMI, oral membranes moist Neck: supple Cardiovascular: RRR without murmur. No JVD    Respiratory/Chest: CTA Bilaterally without wheezes or rales. Normal effort    GI/Abdomen: BS +, non-tender, non-distended Ext: no clubbing, cyanosis, or edema Psych: pleasant and cooperative Neuro: good attention. Normal language. Reasonable insight and awareness. Very alert. Mild dysarthria  Cranial nerves 2-12 are intact except for mild left 7. Sensory exam is normal. Reflexes are 1-2+ in all 4's. Fine motor coordination is intact. No tremors.  LUE 4- to 4/5 prox to distal. LLE 4- to 4/5 prox to distal. RUE and RLE 5/5.  Musculoskeletal: Full ROM, No pain with AROM or PROM in the neck, trunk, or extremities. Posture appropriate       Assessment/Plan: 1. Functional deficits secondary to right MCA infarct which require 3+ hours per day of interdisciplinary therapy in a comprehensive inpatient rehab  setting.  Physiatrist is providing close team supervision and 24 hour management of active medical problems listed below.  Physiatrist and rehab team continue to assess barriers to discharge/monitor patient progress toward functional and medical goals  Care Tool:  Bathing    Body parts bathed by patient: Left arm, Chest, Abdomen, Front perineal area, Buttocks, Right upper leg, Left upper leg   Body parts bathed by helper: Right arm, Right lower leg, Left lower leg, Face     Bathing assist Assist Level: Minimal Assistance - Patient > 75%     Upper Body Dressing/Undressing Upper body dressing   What is the patient wearing?: Pull over shirt    Upper body assist Assist Level: Minimal Assistance - Patient > 75%    Lower Body Dressing/Undressing Lower body dressing      What is the patient wearing?: Underwear/pull up, Pants     Lower body assist Assist for lower body dressing: Minimal Assistance - Patient > 75%     Toileting Toileting    Toileting assist Assist for toileting: Contact Guard/Touching assist Assistive Device Comment: urinal   Transfers Chair/bed transfer  Transfers assist     Chair/bed transfer assist level: Contact Guard/Touching assist     Locomotion Ambulation   Ambulation assist      Assist level: Contact Guard/Touching assist Assistive device: No Device Max distance: 150'   Walk 10 feet activity   Assist     Assist level: Contact Guard/Touching assist Assistive device:  No Device   Walk 50 feet activity   Assist    Assist level: Contact Guard/Touching assist Assistive device: No Device    Walk 150 feet activity   Assist    Assist level: Contact Guard/Touching assist Assistive device: No Device    Walk 10 feet on uneven surface  activity   Assist     Assist level: Contact Guard/Touching assist     Wheelchair     Assist Will patient use wheelchair at discharge?: No             Wheelchair 50 feet  with 2 turns activity    Assist            Wheelchair 150 feet activity     Assist          Blood pressure 105/71, pulse 63, temperature 98.3 F (36.8 C), temperature source Oral, resp. rate 18, height 5\' 4"  (1.626 m), weight 81.3 kg, SpO2 98 %.  Medical Problem List and Plan: 1.  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 Stroke effected right basal ganglia, insula and anterior temporal lobe           -patient may shower             -ELOS/Goals: 8-12 days/Mod I/Supervision             continue PT, OT, SLP 2.  Antithrombotics: -DVT/anticoagulation:  Pharmaceutical: Lovenox             -antiplatelet therapy: DAPT x 3 weeks--d/c ASA 11/10 followed by Plavix alone 3. Pain Management: N/A 4. Mood: LCSW to follow for evaluation and support. Motivated to get better and back to his children.              -antipsychotic agents: N/A 5. Neuropsych: This patient is capable of making decisions on his own behalf. 6. Skin/Wound Care: Routine pressure relief measures.  7. Fluids/Electrolytes/Nutrition:   Encourage nectar liquids intake.     -yesterday's labs reviewed  -mild elevation of LFT's. Will recheck next week  -albumin low, but patient eating extremely well 8. 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 No driving till seizure free X 6 months.  9. Aspiration pneumonia: On cefdinir D #4/7--end 11/1  -afebrile, wbc's down to 9 on 10/28 10.  Post stroke dysphagia: On dysphagia 3, nectar liquids. Discontinued IVF encourage fluid intake.   -continue current diet 11.  Acute blood loss anemia: Will monitor H/H for stability and monitor for signs of bleeding.              hgb up to 12.7 10/28 12.  Obesity             Encourage weight loss    LOS: 2 days A FACE TO FACE EVALUATION WAS PERFORMED  11/28 07/14/2020, 10:07 AM

## 2020-07-14 NOTE — Progress Notes (Signed)
Physical Therapy Session Note  Patient Details  Name: Mark Oneal MRN: [id number] Date of Birth: 04-18-1986  Today's Date: 07/14/2020 PT Individual Time: 5638-9373 PT Individual Time Calculation (min): 69 min   Short Term Goals: Week 1:  PT Short Term Goal 1 (Week 1): STgs = LTGs due to ELOS  Skilled Therapeutic Interventions/Progress Updates:     Pt received supine in bed and agrees to therapy. No complaint of pain. Mother present from [place]. Supine to sit with supervision and bed features. Pt performs sit to stand and ambulation 100' to therapy gym. Pt performs Nustep activity for NMR for reciprocal coordination training and endurance training. PT ace wraps L upper extremity to facilitate grip. Pt completes total of 15:00 on workload of 6 with several rest breaks.   Pt performs "cornhole" activity for dynamic balance training as well as fine motor skills and coordination of L upper extremity. PT provides multimodal cuing for sequencing and use of visual input to facilitate improved grip and manual dexterity. Pt demos improvement in mechanics throughout activity. Pt requires several seated rest breaks due to fatigue.  Pt ambulates >300' with PT provide supervision. Pt has x1 LOB during turning but is able to maintain stability without physical assistance. PT provides dynamic gait challenges such as sudden velocity changes, stopping, pivoting, and direction changes. Pt completes without LOB.   Sit to supine independently. Left supine in bed with all needs within reach.   Therapy Documentation Precautions:  Precautions Precautions: Fall Restrictions Weight Bearing Restrictions: No    Therapy/Group: Individual Therapy  Mark Oneal, PT, DPT 07/14/2020, 1:50 PM

## 2020-07-14 NOTE — Progress Notes (Signed)
Patient ID: Mark Oneal, male   DOB: September 27, 1985, 34 y.o.   MRN: [id number]  SW met with pt and called [name] while in room (using [employer] Spanish interpreter Verdis Frederickson ID#[id number]) explain statement of service form, updates after team conference on Tuesday, and  scheduled family edu for Tuesday 1pm-3pm.   Loralee Pacas, MSW, [place] Office: [phone] Cell: [phone] Fax: [phone]

## 2020-07-14 NOTE — Care Management (Signed)
Inpatient Rehabilitation Center Individual Statement of Services  Patient Name:  Mark Oneal  Date:  07/14/2020  Welcome to the Inpatient Rehabilitation Center.  Our goal is to provide you with an individualized program based on your diagnosis and situation, designed to meet your specific needs.  With this comprehensive rehabilitation program, you will be expected to participate in at least 3 hours of rehabilitation therapies Monday-Friday, with modified therapy programming on the weekends.  Your rehabilitation program will include the following services:  Physical Therapy (PT), Occupational Therapy (OT), Speech Therapy (ST), 24 hour per day rehabilitation nursing, Therapeutic Recreaction (TR), Psychology, Neuropsychology, Care Coordinator, Rehabilitation Medicine, Nutrition Services, Pharmacy Services and Other  Weekly team conferences will be held on Tuesdays to discuss your progress.  Your Inpatient Rehabilitation Care Coordinator will talk with you frequently to get your input and to update you on team discussions.  Team conferences with you and your family in attendance may also be held.  Expected length of stay: 7-9 days   Overall anticipated outcome: Independent  Depending on your progress and recovery, your program may change. Your Inpatient Rehabilitation Care Coordinator will coordinate services and will keep you informed of any changes. Your Inpatient Rehabilitation Care Coordinator's name and contact numbers are listed  below.  The following services may also be recommended but are not provided by the Inpatient Rehabilitation Center:   Driving Evaluations  Home Health Rehabiltiation Services  Outpatient Rehabilitation Services  Vocational Rehabilitation   Arrangements will be made to provide these services after discharge if needed.  Arrangements include referral to agencies that provide these services.  Your insurance has been verified to be:  Uninsured  Your primary  doctor is:  No PCP  Pertinent information will be shared with your doctor and your insurance company.  Inpatient Rehabilitation Care Coordinator:  Susie Cassette [phone] or [phone]  Information discussed with and copy given to patient by: Gretchen Short, 07/14/2020, 4:41 PM

## 2020-07-14 NOTE — Progress Notes (Signed)
°   07/14/20 1500  Clinical Encounter Type  Visited With Patient not available  Visit Type Other (Comment)  Referral From Nurse  Consult/Referral To Chaplain  Spiritual Encounters  Spiritual Needs Other (Comment)  Stress Factors  Patient Stress Factors None identified  Family Stress Factors None identified  Chaplain had patient on consult list for an AD. Chaplain tried a couple of times today to see patient. Second visit, left Spanish version of AD on patient's bed. Chaplain available when needed.

## 2020-07-14 NOTE — Progress Notes (Signed)
Occupational Therapy Session Note  Patient Details  Name: Mark Oneal MRN: [id number] Date of Birth: 02/14/1986  Today's Date: 07/14/2020 OT Individual Time: 0915-1030 OT Individual Time Calculation (min): 75 min    Short Term Goals: No short term goals set  Skilled Therapeutic Interventions/Progress Updates:    Pt seen this session to focus on LUE functional movement and balance.  Pt  Received in bed and declined a shower today as he did not have fresh clothing to put on. He stated his family would bring him more clothing this afternoon.  Pt ambulated to gym with CGA.  In gym worked on various activities to develop balance and LUE motor control: -B hands on hula hoop with cues to squeeze L hand with pushing hoop forward and back and rotating it like a steering wheel. Pt needed frequent cues for full upright posture.  -B hands on dowel bar reaching to 90 degrees and then using dowel to hit ball for a ball bounce activities - B hands tossing and bouncing soft small ball (pt unable to maintain full finger extension well but able to B catch ball with fingers flexed) -sit to stand holding ball and reaching ball up to 120 sh flex with a/arom for L sh -B hands working on 2 pipe tree designs. Pt had no difficulty following design patterns and did not demonstrate L inattention.  Hand over hand A to stabilize L hand on pieced when he needed to push or pull pieced together or apart.     Pt ambulated to fall festival in dayroom to participate in standing balance activities doing a pong ball and ring toss activity. He did a standing coordination timed activity of apple bobbing grabbing the apples with a reacher. He then participated in a rock painting activity while standing.  He also wrote down what he was grateful for to add to the Thanksgiving tree.    Pt stated he felt quite tired with all the activity.  He opted to rest in bed at the end of the session. Bed alarm on and all needs met.   Therapy  Documentation Precaution: fall   Vital Signs: Therapy Vitals Temp: 98.3 F (36.8 C) Temp Source: Oral Pulse Rate: 63 Resp: 18 BP: 105/71 Patient Position (if appropriate): Lying Oxygen Therapy SpO2: 98 % O2 Device: Room Air Pain: Pain Assessment Pain Score: 0-No pain ADL: ADL Grooming: Supervision/safety Upper Body Bathing: Minimal assistance Where Assessed-Upper Body Bathing: Shower Lower Body Bathing: Minimal assistance Where Assessed-Lower Body Bathing: Shower Upper Body Dressing: Minimal assistance Where Assessed-Upper Body Dressing: Edge of bed Lower Body Dressing: Moderate assistance Where Assessed-Lower Body Dressing: Edge of bed Toileting: Minimal assistance Where Assessed-Toileting: Glass blower/designer: Therapist, music Method: Magazine features editor: Curator Method: Heritage manager: Radio broadcast assistant, Grab bars   Therapy/Group: Individual Therapy  [place] 07/14/2020, 10:01 AM

## 2020-07-15 ENCOUNTER — Inpatient Hospital Stay (HOSPITAL_COMMUNITY): Payer: Self-pay | Admitting: Physical Therapy

## 2020-07-15 ENCOUNTER — Inpatient Hospital Stay (HOSPITAL_COMMUNITY): Payer: Self-pay | Admitting: Speech Pathology

## 2020-07-15 ENCOUNTER — Inpatient Hospital Stay (HOSPITAL_COMMUNITY): Payer: Self-pay

## 2020-07-15 MED ORDER — CHLORHEXIDINE GLUCONATE 0.12 % MT SOLN
OROMUCOSAL | Status: AC
  Administered 2020-07-15: 15 mL via OROMUCOSAL
  Filled 2020-07-15: qty 15

## 2020-07-15 NOTE — Progress Notes (Signed)
Physical Therapy Session Note  Patient Details  Name: Mark Oneal MRN: [id number] Date of Birth: [birth year]  Today's Date: 07/15/2020 PT Individual Time: 1113-1208 and 1703-1750 PT Individual Time Calculation (min): 55 min and 47 min   Short Term Goals: Week 1:  PT Short Term Goal 1 (Week 1): STgs = LTGs due to ELOS  Skilled Therapeutic Interventions/Progress Updates:    Session 1:  Pt received in bed and agreeable to therapy session with video interpreter used during session Jomarie Longs [phone]). Pt able to come to sitting on EOB with supervision and stand w/ CGA. Gait training ~177ft to main therapy gym with CGA - one near LOB noted as his L toe caught the floor which was corrected with verbal cueing. Pt was able to self correct and states "this is usual". Pt requested to increase movement and strength of his L hand during today's session.  Pt performed 4 steps (6" height) 4x with CGA ascending and minA with verbal cues descending in order to perform properly. No handrails were used and reciprocal pattern demonstrated. Pt demonstrates minor LOB to the L when turning R at the top of the stairs each time.  Pt participated in 10 sit <> stands with large yellow theraball held between both hands for bimanual task targeting L UE NMR via squeezing and raising it above his head when standing. Pt needed assistance with L hand finger/wrist extension to place open hand on ball appropriately.   Pt instructed to stay standing and bounce large yellow theraball with force onto the ground to 2nd person across from him while being guarded by therapist. Pt able to bounce the ball well, and when asked to catch the returning ball, is able to open his hand larger than before. Pt continued to need some assistance to place L hand on ball appropriately during this task as he progressed through the activity. Pt required min assist for balance during this task.  Pt then participated in 2 games of bowling using 8 pins. Pt  used partially deflated blue ball in order to make it more pliable and acessbile for his current level of grip and L hand function. Pt was instructed to extend his fingers and open his hand as much as possible, then place his palm on the ball and use all 5 fingers to grasp it before throwing. Following knocking the pins down, pt was instructed to use his L hand to pick up the pins and set them back up which also required him to squat down or bend at his hips. Noted difficulty with opening his hand to let go of the pin with precision. Required min assist for balance during this task.  Pt then tasked with placing large wooden pegs in peg board from standing to promote increased L hand dexterity. Pt had to reach down to 18" step to pick up a large wooden peg (x9) from a bucket and pinch a peg in between his fingers and thumb, then raise back up and place in peg board at rib level. Pt required min assist for balance and demonstration/visual cues for opening his hand and placing the peg. Pt demos L inattention often dropping the PEG when turning to place it in the board.  Pt given a stress ball in order to promote grip strength and L UE NMR. Performed seated L UE NMR focusing on grasp/release of stress ball with emphasis on increased finger extension for full release. Gait training ~133ft back to room, no AD, with CGA for steadying.  Sit>supine mod-I using bed features. Pt left supine in bed with needs in reach and bed alarm on.   Session 2:  Pt received in bed and agreeable to therapy session. Supine>sit, L EOB, using bed features mod-I. Pt verbalizes need to use the bathroom. Gait in/out bathroom, no AD, with CGA for steadying. Standing with CGA for safety able to manage LB clothing without assist - continent void of bladder. Standing hand hygiene at sink with CGA for safety - pt demos L inattention only washing R hand (leaves L hand down by his side), cuing to include in task but then pt not attending to rinse  off the soap nor dry his L hand requiring max cuing for attention. Gait training ~144ft to main therapy gym, no AD, with CGA for safety/steadying - adequately clearing L foot during swing with no LOB - still noted to have slight instability when turning.  Dynamic gait and L attention task via ambulating up/down hallway to locate numbered sticky notes (1-10) on the walls - instructed to find in ascending order - demos good visual scanning and able to complete in timely manner with CGA for steadying - some difficulty noted opening his hand and reaching under the sticky note against flat surface, needing them bent in order to grab them.  Dynamic gait training via:  - stepping on and over 6" step, no UE support, stepping up with R LE then over step with L LE targeting LLE NMR to increase hip/knee flexion for increased foot clearance during swing - min assist for balance.  - agility ladder drills including: forward 2 feet in each square leading with L LE targeting increased speed and coordination of movement, forward 2 feet in/2 feet out working on sequencing and L attention with pt requiring max cuing and visual demonstrate to learn sequencing of this pattern, lateral side stepping working on increased L hip abductor activation with quick lateral stepping - required min assist throughout with most instability noted during lateral stepping - walking with emphasis on heel strike using external target with pt demoing good carryover  Dynamic standing balance and dual-cognitive task via full circle foot tapping towards external targets based on varying numerical vs color verbal commands with pt demoing some L hemibody inattention trying to taps targets on L side with R LE - CGA/min assist for balance. Gait training ~128ft back to room, no AD, with CGA for steadying. Sit>supine mod-I. Pt left supine in bed with needs in reach and bed alarm on.   Therapy Documentation Precautions:  Precautions Precautions:  Fall Restrictions Weight Bearing Restrictions: No  Pain:   Session 1: No reports of pain throughout session.  Session 2: No reports of pain throughout session.  Therapy/Group: Individual Therapy  Ginny Forth , PT, DPT, CSRS  07/15/2020, 8:08 AM

## 2020-07-15 NOTE — IPOC Note (Signed)
Overall Plan of Care [facility]) Patient Details Name: Mark Oneal MRN: [id number] DOB: 09-14-1986  Admitting Diagnosis: Acute ischemic right MCA stroke [facility])  Hospital Problems: Principal Problem:   Acute ischemic right MCA stroke (HCC)     Functional Problem List: Nursing Endurance, Medication Management, Motor, Nutrition, Perception, Safety  PT Balance, Endurance, Motor, Pain, Perception, Safety  OT Balance, Endurance, Motor, Perception  SLP Cognition, Nutrition  TR         Basic ADL's: OT Eating, Grooming, Bathing, Dressing, Toileting     Advanced  ADL's: OT       Transfers: PT Bed Mobility, Bed to Chair, Car  OT Toilet, Tub/Shower     Locomotion: PT Ambulation, Stairs     Additional Impairments: OT Fuctional Use of Upper Extremity  SLP Social Cognition, Swallowing   Problem Solving, Attention, Awareness  TR      Anticipated Outcomes Item Anticipated Outcome  Self Feeding independent  Swallowing  Supervision A   Basic self-care  mod I  Toileting  mod I   Bathroom Transfers mod I to toilet; Supervision to shower  Bowel/Bladder  Remain continent and maintain regular pattern of emptying bowel and bladder  Transfers  Independent  Locomotion  Independent  Communication     Cognition  Supervision A  Pain  No pain or less than 3  Safety/Judgment  Remain free of falls, skin breakdown andinfection   Therapy Plan: PT Intensity: Minimum of 1-2 x/day ,45 to 90 minutes PT Frequency: 5 out of 7 days PT Duration Estimated Length of Stay: 7-9 days OT Intensity: Minimum of 1-2 x/day, 45 to 90 minutes OT Frequency: 5 out of 7 days OT Duration/Estimated Length of Stay: 7-9 days SLP Intensity: Minumum of 1-2 x/day, 30 to 90 minutes SLP Frequency: 3 to 5 out of 7 days SLP Duration/Estimated Length of Stay: 7-9 days   Due to the current state of emergency, patients may not be receiving their 3-hours of Medicare-mandated therapy.   Team Interventions: Nursing  Interventions Patient/Family Education, Disease Management/Prevention, Medication Management, Cognitive Remediation/Compensation, Dysphagia/Aspiration Precaution Training  PT interventions Ambulation/gait training, Community reintegration, Neuromuscular re-education, Psychosocial support, Stair training, UE/LE Strength taining/ROM, Warden/ranger, Discharge planning, Functional electrical stimulation, Pain management, Therapeutic Activities, UE/LE Coordination activities, Cognitive remediation/compensation, Disease management/prevention, Visual/perceptual remediation/compensation, Therapeutic Exercise, Splinting/orthotics, Patient/family education, Functional mobility training  OT Interventions Balance/vestibular training, Discharge planning, Functional mobility training, DME/adaptive equipment instruction, Cognitive remediation/compensation, Neuromuscular re-education, Patient/family education, Psychosocial support, Therapeutic Activities, Self Care/advanced ADL retraining, Therapeutic Exercise, UE/LE Strength taining/ROM, UE/LE Coordination activities, Visual/perceptual remediation/compensation  SLP Interventions Cognitive remediation/compensation, Cueing hierarchy, Dysphagia/aspiration precaution training, Patient/family education, Functional tasks  TR Interventions    SW/CM Interventions Discharge Planning, Psychosocial Support, Patient/Family Education   Barriers to Discharge MD  Medical stability  Nursing Decreased caregiver support, Home environment access/layout, Medication compliance, Nutrition means    PT      OT      SLP      SW Other (comments) Pt is uninsured   Team Discharge Planning: Destination: PT-Home ,OT- Home , SLP-Home Projected Follow-up: PT-None, OT-  Outpatient OT, SLP-24 hour supervision/assistance (follow up ST TBD depending on progress) Projected Equipment Needs: PT-To be determined, OT- Tub/shower bench, SLP-None recommended by SLP Equipment Details:  PT- , OT-  Patient/family involved in discharge planning: PT- Patient,  OT-Patient, SLP-Patient  MD ELOS: 7-10 days Medical Rehab Prognosis:  Excellent Assessment: The patient has been admitted for CIR therapies with the diagnosis of right MCA infarct. The team will be  addressing functional mobility, strength, stamina, balance, safety, adaptive techniques and equipment, self-care, bowel and bladder mgt, patient and caregiver education, NMR, visual-spatial awareness, cognition, communication. Goals have been set at mod I for mobility and self-care and mod I to supervision for cognition.   Due to the current state of emergency, patients may not be receiving their 3 hours per day of Medicare-mandated therapy.    Ranelle Oyster, MD, FAAPMR      See Team Conference Notes for weekly updates to the plan of care

## 2020-07-15 NOTE — Progress Notes (Signed)
Speech Language Pathology Daily Session Note  Patient Details  Name: Mark Oneal MRN: [id number] Date of Birth: [birth year]  Today's Date: 07/15/2020 SLP Individual Time: 0950-1030 SLP Individual Time Calculation (min): 40 min  Short Term Goals: Week 1: SLP Short Term Goal 1 (Week 1): STG=LTG due to short estimated length of stay  Skilled Therapeutic Interventions:  Pt was seen for skilled ST targeting goals for cognition and dysphagia.  SLP facilitated the session with trials of thin liquids following oral care to continue working towards diet progression.  Pt had delayed coughing in 4 out of 6 sips of ice water and palpation during the swallow indicated possible mild, ongoing delay in swallow initiation.  Continue to recommend MBS prior to discharge to determine readiness to advance.  Pt may also be a candidate for the water protocol but will defer ultimate decision to primary SLP in light of infiltrates noted on most recent chest x ray.  Currently pt is afebrile with O2 sats remaining WFL on room air, lungs documented to be clear by nursing in bedside assessment.  SLP also facilitated the session with medication management tasks to address functional problem solving goals.  Pt was able to load pills into a pill box with supervision verbal cues for task organization and attention to detail.  Pt was left in bed with bed alarm set and call bell within reach.  Continue per current plan of care.   Pain Pain Assessment Pain Scale: 0-10 Pain Score: 0-No pain  Therapy/Group: Individual Therapy  Audriana Aldama, Melanee Spry 07/15/2020, 12:32 PM

## 2020-07-15 NOTE — Progress Notes (Signed)
Occupational Therapy Session Note  Patient Details  Name: Mark Oneal MRN: [id number] Date of Birth: 1986/01/05  Today's Date: 07/15/2020 OT Individual Time: 1300-1357 OT Individual Time Calculation (min): 57 min    Short Term Goals: Week 1:  OT Short Term Goal 1 (Week 1): STGS = LTGs  Skilled Therapeutic Interventions/Progress Updates:    1;1. Pt received in bed agreeable to OT. Pt completes all ambulation with no AD and CGA fading to S overall with no LOB. Focus of session on L NMR/GMC. Pt completes all activities in standing with S for balance. With a coban glove keeping LUE grasping on tennis racket, pt completes balloon volley 2x5 min trials for GMC/visual perception/balance requires for BADLs. Pt demo decent grasp strength in LUE therefore OT believes L inattention is limiting factor for grip as well. Pt completes grasp/placement of bolt into slot from low bench onto high/low tabletop with MIN facilitation of FF reach. Pt requires A to maintain FF/grip on bolt while attatching nut to bolt on vertical board. Pt completes standing loading and unloading of dishwasher from high cabinet requiring tactile cues at shoulder to decrease shoulder hike with fatigue during FF with MOD A to maintain grasp on cup. Exited session with pt seated in bed, exit alarm on and call light in reach   Therapy Documentation Precautions:  Precautions Precautions: Fall Restrictions Weight Bearing Restrictions: No General:   Vital Signs:  Pain: Pain Assessment Pain Scale: 0-10 Pain Score: 0-No pain ADL: ADL Grooming: Supervision/safety Upper Body Bathing: Minimal assistance Where Assessed-Upper Body Bathing: Shower Lower Body Bathing: Minimal assistance Where Assessed-Lower Body Bathing: Shower Upper Body Dressing: Minimal assistance Where Assessed-Upper Body Dressing: Edge of bed Lower Body Dressing: Moderate assistance Where Assessed-Lower Body Dressing: Edge of bed Toileting: Minimal  assistance Where Assessed-Toileting: Teacher, adult education: Furniture conservator/restorer Method: Event organiser: Administrator, arts Method: Designer, industrial/product: Emergency planning/management officer, Scientist, research (medical)    Praxis   Exercises:   Other Treatments:     Therapy/Group: Individual Therapy  Shon Hale 07/15/2020, 1:58 PM

## 2020-07-16 MED ORDER — CHLORHEXIDINE GLUCONATE 0.12 % MT SOLN
OROMUCOSAL | Status: AC
  Administered 2020-07-16: 15 mL via OROMUCOSAL
  Filled 2020-07-16: qty 15

## 2020-07-17 ENCOUNTER — Inpatient Hospital Stay (HOSPITAL_COMMUNITY): Payer: Self-pay

## 2020-07-17 ENCOUNTER — Inpatient Hospital Stay (HOSPITAL_COMMUNITY): Payer: Self-pay | Admitting: Speech Pathology

## 2020-07-17 ENCOUNTER — Inpatient Hospital Stay (HOSPITAL_COMMUNITY): Payer: Self-pay | Admitting: Occupational Therapy

## 2020-07-17 LAB — BASIC METABOLIC PANEL
Anion gap: 11 (ref 5–15)
BUN: 12 mg/dL (ref 6–20)
CO2: 25 mmol/L (ref 22–32)
Calcium: 9.9 mg/dL (ref 8.9–10.3)
Chloride: 103 mmol/L (ref 98–111)
Creatinine, Ser: 0.89 mg/dL (ref 0.61–1.24)
GFR, Estimated: >60 mL/min (ref >60)
Glucose, Bld: 97 mg/dL (ref 70–99)
Potassium: 4 mmol/L (ref 3.5–5.1)
Sodium: 139 mmol/L (ref 135–145)

## 2020-07-17 LAB — CBC
HCT: 40.3 % (ref 39.0–52.0)
Hemoglobin: 13.6 g/dL (ref 13.0–17.0)
MCH: 31.2 pg (ref 26.0–34.0)
MCHC: 33.7 g/dL (ref 30.0–36.0)
MCV: 92.4 fL (ref 80.0–100.0)
Platelets: 457 10*3/uL — ABNORMAL HIGH (ref 150–400)
RBC: 4.36 MIL/uL (ref 4.22–5.81)
RDW: 11.3 % — ABNORMAL LOW (ref 11.5–15.5)
WBC: 9.2 10*3/uL (ref 4.0–10.5)
nRBC: 0 % (ref 0.0–0.2)

## 2020-07-17 NOTE — Progress Notes (Signed)
Occupational Therapy Session Note  Patient Details  Name: Mark Oneal MRN: [id number] Date of Birth: [birth year]  Today's Date: 07/17/2020 OT Individual Time: 1045-1200 OT Individual Time Calculation (min): 75 min    Short Term Goals: Week 1:  OT Short Term Goal 1 (Week 1): STGS = LTGs  Skilled Therapeutic Interventions/Progress Updates:    Pt seen for ADL training to include toileting, shower and dressing with a focus on balance and use of LUE.  He sat to EOB with no rails and bed flat. Pt is very private and the environment was set up to give him as much privacy as possible while this therapist could ensure pt was safe.  He has a very limited grasp on L but he was able to hold wash cloth to wash R arm. He stood with S to wash his bottom.  Ambulated with CGA to bed to dress.  Cues to start left side into clothing and cues for orientation of pants.  He continues to need A with socks and to tie shoes.    Pt ambulated with CGA to ortho gym to work on [employer] activities for LUE focusing on strength, coordination and attention to L side: - standing arm crank on UBE with 50% A to keep L hand stabilized on handle -seated zoom ball with improved sh abduction ROM -standing wall slides (he can now slide hand up the wall) -standing and reaching to floor to pick up laundry basket and put it back to floor 5x -carrying laundry basket in B hands and walking 150 ft with frequent cues to maintain grasp on basket. -2# wrist weight for shoulder ex:  Arm circles for sh flex and abd lifts - prone push ups on knees in quadriped - prone walking hands forward and back 4 x to move closer to a plank on knees position   Pt did need a few short rest breaks and he kept saying he was very tired.  Ambulated back to room, washed hands and opted to get into bed to rest. Bed alarm on and all needs met.  Therapy Documentation Precautions:  Precautions Precautions: Fall Restrictions Weight Bearing Restrictions:  No  Pain: Pain Assessment Pain Score: 0-No pain ADL: ADL Grooming: Supervision/safety Where Assessed-Grooming: Standing at sink Upper Body Bathing: Supervision/safety Where Assessed-Upper Body Bathing: Shower Lower Body Bathing: Supervision/safety Where Assessed-Lower Body Bathing: Shower Upper Body Dressing: Supervision/safety Where Assessed-Upper Body Dressing: Edge of bed Lower Body Dressing: Minimal assistance Where Assessed-Lower Body Dressing: Edge of bed Toileting: Supervision/safety Where Assessed-Toileting: Glass blower/designer: Therapist, music Method: Magazine features editor: Curator Method: Heritage manager: Radio broadcast assistant, Grab bars   Therapy/Group: Individual Therapy  [place] 07/17/2020, 12:33 PM

## 2020-07-17 NOTE — Progress Notes (Signed)
Physical Therapy Session Note  Patient Details  Name: Mark Oneal MRN: [id number] Date of Birth: 11/14/1985  Today's Date: 07/17/2020 PT Individual Time: 0903-0959 PT Individual Time Calculation (min): 56 min   Short Term Goals: Week 1:  PT Short Term Goal 1 (Week 1): STgs = LTGs due to ELOS  Skilled Therapeutic Interventions/Progress Updates:     Pt received supine in bed and agreeable to therapy. No complaint of pain. Supine<>sit during session mod(I) with use of bedrails and elevated HOB. Sit to stand multiple times during session independently without AD.  Pt ambulates >500' without AD and with supervision. Pt initially demos frequent toe drag on L side but is able to correct with cuing and improves gait mechanics as gait distance increases. Pt manages elevator with supervision from PT and cuing on button management, with pt utilizing L hand to press elevator buttons. Pt then ambulates to [employer] for community reintegration. Pt orders coffee and PT cues pt to use L hand to retrieve coffee. PT then provide multimodal cues on preparing coffee with sugar and stirring with straw, with pt requiring hand over hand assistance for efficiency. Pt cued to drink coffee by holding cup with L hand and using R hand to support cup underneath. Pt then ambulates with coffee in hand. PT holds hand underneath pt's hand and provides consistent cuing for pt to lift cup and use visual input for more effective L hand use.   Pt ambulates up and down ramp with supervision and cues for L toe clearance. Pt then performs [employer] and cued to only use L upper extremity to press buttons.  Pt left supine in bed with all needs within reach.  Therapy Documentation Precautions:  Precautions Precautions: Fall Restrictions Weight Bearing Restrictions: No    Therapy/Group: Individual Therapy  Beau Fanny, PT, DPT 07/17/2020, 4:08 PM

## 2020-07-17 NOTE — Progress Notes (Signed)
Upon assessment Iv site observed to be dislodged with no Iv medications currently being administered  IV site removed. MD notified in Am

## 2020-07-17 NOTE — Progress Notes (Signed)
Speech Language Pathology Daily Session Note  Patient Details  Name: Mark Oneal MRN: [id number] Date of Birth: 1986/05/09  Today's Date: 07/17/2020 SLP Individual Time: 1400-1455 SLP Individual Time Calculation (min): 55 min  Short Term Goals: Week 1: SLP Short Term Goal 1 (Week 1): STG=LTG due to short estimated length of stay  Skilled Therapeutic Interventions: Skilled treatment session focused on dysphagia and cognitive goals. SLP facilitated session by providing trials of thin liquids after oral care. Patient consumed trials with intermittent throat clearing and coughing. However, overt s/s of aspiration appeared to decrease with cues to orally hold the liquid and swallow hard. Patient will need a repeat MBS prior to upgrade due to h/o silent aspiration. SLP also facilitated session by providing supervision level verbal cues for functional problem solving with a basic money management task. SLP also administered the MoCA-BASIC in which patient scored 28/30 points which is WNL. Patient left upright in bed with alarm on and all needs within reach. Continue with current plan of care.      Pain Pain Assessment Pain Score: 0-No pain  Therapy/Group: Individual Therapy  Ife Vitelli 07/17/2020, 3:06 PM

## 2020-07-17 NOTE — Progress Notes (Signed)
[place] PHYSICAL MEDICINE & REHABILITATION PROGRESS NOTE   Subjective/Complaints: No complaints this morning. Denies pain, constipation, insomnia.   ROS: Patient denies fever, rash, sore throat, blurred vision, nausea, vomiting, diarrhea, cough, shortness of breath or chest pain, joint or back pain, headache, or mood change.   Objective:   No results found. Recent Labs    07/17/20 0810  WBC 9.2  HGB 13.6  HCT 40.3  PLT 457*   Recent Labs    07/17/20 0810  NA 139  K 4.0  CL 103  CO2 25  GLUCOSE 97  BUN 12  CREATININE 0.89  CALCIUM 9.9    Intake/Output Summary (Last 24 hours) at 07/17/2020 1222 Last data filed at 07/17/2020 0826 Gross per 24 hour  Intake 462 ml  Output --  Net 462 ml        Physical Exam: Vital Signs Blood pressure (!) 97/57, pulse 79, temperature 98.3 F (36.8 C), resp. rate 16, height 5\' 4"  (1.626 m), weight 81.3 kg, SpO2 97 %.  General: Alert and oriented x 3, No apparent distress HEENT: Head is normocephalic, atraumatic, PERRLA, EOMI, sclera anicteric, oral mucosa pink and moist, dentition intact, ext ear canals clear,  Neck: Supple without JVD or lymphadenopathy Heart: Reg rate and rhythm. No murmurs rubs or gallops Chest: CTA bilaterally without wheezes, rales, or rhonchi; no distress Abdomen: Soft, non-tender, non-distended, bowel sounds positive. Extremities: No clubbing, cyanosis, or edema. Pulses are 2+ Psych: pleasant and cooperative Neuro: good attention. Normal language. Reasonable insight and awareness. Very alert. Mild dysarthria  Cranial nerves 2-12 are intact except for mild left 7. Sensory exam is normal. Reflexes are 1-2+ in all 4's. Fine motor coordination is intact. No tremors.  LUE 4- to 4/5 prox to distal. LLE 4- to 4/5 prox to distal. RUE and RLE 5/5.  Musculoskeletal: Full ROM, No pain with AROM or PROM in the neck, trunk, or extremities. Posture appropriate   Assessment/Plan: 1. Functional deficits secondary to  right MCA infarct which require 3+ hours per day of interdisciplinary therapy in a comprehensive inpatient rehab setting.  Physiatrist is providing close team supervision and 24 hour management of active medical problems listed below.  Physiatrist and rehab team continue to assess barriers to discharge/monitor patient progress toward functional and medical goals  Care Tool:  Bathing    Body parts bathed by patient: Left arm, Chest, Abdomen, Front perineal area, Buttocks, Right upper leg, Left upper leg   Body parts bathed by helper: Right arm, Right lower leg, Left lower leg, Face     Bathing assist Assist Level: Minimal Assistance - Patient > 75%     Upper Body Dressing/Undressing Upper body dressing   What is the patient wearing?: Pull over shirt    Upper body assist Assist Level: Minimal Assistance - Patient > 75%    Lower Body Dressing/Undressing Lower body dressing      What is the patient wearing?: Underwear/pull up, Pants     Lower body assist Assist for lower body dressing: Minimal Assistance - Patient > 75%     Toileting Toileting    Toileting assist Assist for toileting: Contact Guard/Touching assist Assistive Device Comment: urinal   Transfers Chair/bed transfer  Transfers assist     Chair/bed transfer assist level: Contact Guard/Touching assist     Locomotion Ambulation   Ambulation assist      Assist level: Minimal Assistance - Patient > 75% Assistive device: No Device Max distance: ~112ft   Walk 10 feet activity  Assist     Assist level: Contact Guard/Touching assist Assistive device: No Device   Walk 50 feet activity   Assist    Assist level: Minimal Assistance - Patient > 75% Assistive device: No Device    Walk 150 feet activity   Assist    Assist level: Minimal Assistance - Patient > 75% Assistive device: No Device    Walk 10 feet on uneven surface  activity   Assist     Assist level: Contact  Guard/Touching assist     Wheelchair     Assist Will patient use wheelchair at discharge?: No             Wheelchair 50 feet with 2 turns activity    Assist            Wheelchair 150 feet activity     Assist          Blood pressure (!) 97/57, pulse 79, temperature 98.3 F (36.8 C), resp. rate 16, height 5\' 4"  (1.626 m), weight 81.3 kg, SpO2 97 %.  Medical Problem List and Plan: 1.  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 Stroke effected right basal ganglia, insula and anterior temporal lobe           -patient may shower             -ELOS/Goals: 8-12 days/Mod I/Supervision             continue PT, OT, SLP 2.  Antithrombotics: -DVT/anticoagulation:  Pharmaceutical: Lovenox             -antiplatelet therapy: DAPT x 3 weeks--d/c ASA 11/10 followed by Plavix alone 3. Pain Management: N/A 4. Mood: LCSW to follow for evaluation and support. Motivated to get better and back to his children.              -antipsychotic agents: N/A 5. Neuropsych: This patient is capable of making decisions on his own behalf. 6. Skin/Wound Care: Routine pressure relief measures.  7. Fluids/Electrolytes/Nutrition:   Encourage nectar liquids intake.     -yesterday's labs reviewed  -mild elevation of LFT's. Will recheck next week  -albumin low, but patient eating extremely well 8. 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 No driving till seizure free X 6 months.  9. Aspiration pneumonia: On cefdinir D #4/7--end 11/1  -afebrile, wbc's down to 9 on 10/28, 9.2 on 11/1 10.  Post stroke dysphagia: On dysphagia 3, nectar liquids. Discontinued IVF encourage fluid intake.   -continue current diet 11.  Acute blood loss anemia: Will monitor H/H for stability and monitor for signs of bleeding.              hgb up to 12.7 10/28, up to 13.6 on 11/1. 12.  Obesity             Encourage  weight loss 13. Hypotension: asymptomatic. Monitor TID. Not on anti-hypertensives    LOS: 5 days A FACE TO FACE EVALUATION WAS PERFORMED  13/1 P Aliz Meritt 07/17/2020, 12:22 PM

## 2020-07-18 ENCOUNTER — Inpatient Hospital Stay (HOSPITAL_COMMUNITY): Payer: Self-pay | Admitting: Speech Pathology

## 2020-07-18 ENCOUNTER — Inpatient Hospital Stay (HOSPITAL_COMMUNITY): Payer: Self-pay | Admitting: Occupational Therapy

## 2020-07-18 ENCOUNTER — Ambulatory Visit (HOSPITAL_COMMUNITY): Payer: Self-pay

## 2020-07-18 ENCOUNTER — Encounter (HOSPITAL_COMMUNITY): Payer: Self-pay | Admitting: Occupational Therapy

## 2020-07-18 NOTE — Progress Notes (Signed)
Physical Therapy Session Note  Patient Details  Name: Mark Oneal MRN: [id number] Date of Birth: 1986-09-01  Today's Date: 07/18/2020 PT Individual Time: 1937-9024 PT Individual Time Calculation (min): 58 min   Short Term Goals: Week 1:  PT Short Term Goal 1 (Week 1): STgs = LTGs due to ELOS  Skilled Therapeutic Interventions/Progress Updates:     Pt received supine in bed and agrees to therapy. No complaint of pain. Wife present for family education. PT answers questions regarding pt's mobility and provides wife education on pt's functional status, including tendency for L toe drag and slightly incrased difficulty attending to object in L visual field.   Pt performs bed mobility mod(I) with bed features. Sit to stand independently throughout session. Pt ambulates >300' without AD, without demonstrating any toe drag. Pt has slightly decreased step height and length on L side but otherwise gait pattern close to typical. Pt performs car transfer and ramp navigation with supervision and cues for safety.  Pt performs "Letter Search" while standing to provide cognitive challenge during mobility. Pt tasked with finding all the "O"s in puzzle. Pt is able to find 24/24 Os with increased time and min verbal cuing to attend to L side of puzzle.   Pt left supine in bed with all needs within reach and wife present.  Therapy Documentation Precautions:  Precautions Precautions: Fall Restrictions Weight Bearing Restrictions: No    Therapy/Group: Individual Therapy  Beau Fanny, PT, DPT 07/18/2020, 3:43 PM

## 2020-07-18 NOTE — Progress Notes (Signed)
Occupational Therapy Session Note  Patient Details  Name: Mark Oneal MRN: [id number] Date of Birth: 1985/12/03  Today's Date: 07/18/2020 OT Individual Time: 8242-3536    &   1300-1345 OT Individual Time Calculation (min): 75 min    &   45 min   Short Term Goals: Week 1:  OT Short Term Goal 1 (Week 1): STGS = LTGs  Skilled Therapeutic Interventions/Progress Updates:    AM session:   Patient in bed, alert and ready for therapy session.  He denies pain and requests to work on his left arm - strength and motor control this session.  He performs bed mobility mod I.  Sit to stand and ambulation on unit with CS.  Completed a variety of motor control, strength, coordination, dexterity activities t/o session in seated, supine and standing positions - good tolerance and active participation :  dynavision (in stance, 2 minutes, whole screen - R = 1.3 sec, L = 2.07 sec reaction time - note slower on left side - reviewed left side awareness and need to scan consistently and thoroughly on left side) UB ergometer x 5 minutes with focus on left arm grasp and single limb control.  Various proximal motor control activities using mirror to improve symmetry, use of weights for grasp, distal control (note difficulty with maintaining grasp during proximal activities)   Ball bounce/catch in variety of positions, bean bag toss, clothes pins, cards, small object manipulation,  Focus on ergonomics for reach and moving object with bilateral hands from floor to table surfaces, cues for squat position. He returned to bed at close of session, bed alarm set and call bell in hand   PM session:   (family education)   Patient and wife present for afternoon session.  Patient denies pain, notes mild fatigue but is eager to participate in afternoon session.  Reviewed current level of care for dressing and bathing tasks, reviewed DME options for tub/shower - patient demonstrates step in/out of tub with CS (he notes that their tub is  lower than one used here) encouraged use of shower seat initially upon return home to promote independence and safety.  Patient demonstrated ability to ambulate without AD on unit - reviewed left side awareness and safety with scanning environment.  Provided and reviewed HEP to include upper body AROM and theraputty.  Discussed activities appropriate to increase left hand dexterity.  Patient seated edge of bed to await PT session starting momentarily.      Therapy Documentation Precautions:  Precautions Precautions: Fall Restrictions Weight Bearing Restrictions: No   Therapy/Group: Individual Therapy  Barrie Lyme 07/18/2020, 7:38 AM

## 2020-07-18 NOTE — Progress Notes (Signed)
[place] PHYSICAL MEDICINE & REHABILITATION PROGRESS NOTE   Subjective/Complaints: No problems. Denies pain. Happy with progress  ROS: Patient denies fever, rash, sore throat, blurred vision, nausea, vomiting, diarrhea, cough, shortness of breath or chest pain, joint or back pain, headache, or mood change.    Objective:   No results found. Recent Labs    07/17/20 0810  WBC 9.2  HGB 13.6  HCT 40.3  PLT 457*   Recent Labs    07/17/20 0810  NA 139  K 4.0  CL 103  CO2 25  GLUCOSE 97  BUN 12  CREATININE 0.89  CALCIUM 9.9    Intake/Output Summary (Last 24 hours) at 07/18/2020 1024 Last data filed at 07/18/2020 0830 Gross per 24 hour  Intake 610 ml  Output --  Net 610 ml        Physical Exam: Vital Signs Blood pressure 104/61, pulse (!) 55, temperature 98.1 F (36.7 C), resp. rate 17, height 5\' 4"  (1.626 m), weight 81.3 kg, SpO2 97 %.  Constitutional: No distress . Vital signs reviewed. HEENT: EOMI, oral membranes moist Neck: supple Cardiovascular: RRR without murmur. No JVD    Respiratory/Chest: CTA Bilaterally without wheezes or rales. Normal effort    GI/Abdomen: BS +, non-tender, non-distended Ext: no clubbing, cyanosis, or edema Psych: pleasant and cooperative Neuro: good attention. Normal language. Reasonable insight and awareness. Very alert. Mild dysarthria  Mild central CN7. Sensory exam is normal. Reflexes are 1-2+ in all 4's. Fine motor coordination is intact. No tremors.  LUE 4/5 prox to distal. LLE 4/5 prox to distal. RUE and RLE 5/5.  Musculoskeletal: Full ROM, No pain with AROM or PROM in the neck, trunk, or extremities. Posture appropriate   Assessment/Plan: 1. Functional deficits secondary to right MCA infarct which require 3+ hours per day of interdisciplinary therapy in a comprehensive inpatient rehab setting.  Physiatrist is providing close team supervision and 24 hour management of active medical problems listed below.  Physiatrist and  rehab team continue to assess barriers to discharge/monitor patient progress toward functional and medical goals  Care Tool:  Bathing    Body parts bathed by patient: Left arm, Chest, Abdomen, Front perineal area, Buttocks, Right upper leg, Left upper leg, Right arm, Right lower leg, Left lower leg, Face   Body parts bathed by helper: Right arm, Right lower leg, Left lower leg, Face     Bathing assist Assist Level: Supervision/Verbal cueing     Upper Body Dressing/Undressing Upper body dressing   What is the patient wearing?: Pull over shirt    Upper body assist Assist Level: Supervision/Verbal cueing    Lower Body Dressing/Undressing Lower body dressing      What is the patient wearing?: Underwear/pull up, Pants     Lower body assist Assist for lower body dressing: Contact Guard/Touching assist     Toileting Toileting    Toileting assist Assist for toileting: Supervision/Verbal cueing Assistive Device Comment: urinal   Transfers Chair/bed transfer  Transfers assist     Chair/bed transfer assist level: Supervision/Verbal cueing     Locomotion Ambulation   Ambulation assist      Assist level: Supervision/Verbal cueing Assistive device: No Device Max distance: >500'   Walk 10 feet activity   Assist     Assist level: Supervision/Verbal cueing Assistive device: No Device   Walk 50 feet activity   Assist    Assist level: Supervision/Verbal cueing Assistive device: No Device    Walk 150 feet activity   Assist  Assist level: Supervision/Verbal cueing Assistive device: No Device    Walk 10 feet on uneven surface  activity   Assist     Assist level: Supervision/Verbal cueing     Wheelchair     Assist Will patient use wheelchair at discharge?: No             Wheelchair 50 feet with 2 turns activity    Assist            Wheelchair 150 feet activity     Assist          Blood pressure 104/61, pulse  (!) 55, temperature 98.1 F (36.7 C), resp. rate 17, height 5\' 4"  (1.626 m), weight 81.3 kg, SpO2 97 %.  Medical Problem List and Plan: 1.  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 Stroke effected right basal ganglia, insula and anterior temporal lobe           -patient may shower             -ELOS/Goals: 8-12 days/Mod I/Supervision             continue PT, OT, SLP 2.  Antithrombotics: -DVT/anticoagulation:  Pharmaceutical: Lovenox             -antiplatelet therapy: DAPT x 3 weeks--d/c ASA 11/10 followed by Plavix alone 3. Pain Management: N/A 4. Mood: LCSW to follow for evaluation and support. Motivated to get better and back to his children.              -antipsychotic agents: N/A 5. Neuropsych: This patient is capable of making decisions on his own behalf. 6. Skin/Wound Care: Routine pressure relief measures.  7. Fluids/Electrolytes/Nutrition:   Encourage nectar liquids intake.     -labs from 11/1 stable  -mild elevation of LFT's from 10/28. Recheck Thursday  -albumin low, but pt continues to eat well. 8. 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 No driving till seizure free X 6 months.  9. Aspiration pneumonia: On cefdinir completed 11/1  -afebrile, wbc's down to 9.3 on 10/28, 9.2 on 11/1 10.  Post stroke dysphagia: On dysphagia 3, nectar liquids.   -continue current diet 11.  Acute blood loss anemia: Will monitor H/H for stability and monitor for signs of bleeding.              hgb up to 12.7 10/28, up to 13.6 on 11/1. 12.  Obesity             Encourage weight loss 13. Hypotension: asymptomatic. Monitor TID. Not on anti-hypertensives    LOS: 6 days A FACE TO FACE EVALUATION WAS PERFORMED  13/1 07/18/2020, 10:24 AM

## 2020-07-18 NOTE — Patient Care Conference (Signed)
Inpatient RehabilitationTeam Conference and Plan of Care Update Date: 07/18/2020   Time: 10:31 AM    Patient Name: Mark Oneal      Medical Record Number: [id number]  Date of Birth: 29-Dec-1985 Sex: Male         Room/Bed: 0E02M/3V61Q-24 Payor Info: Payor: /    Admit Date/Time:  07/12/2020  3:59 PM  Primary Diagnosis:  Acute ischemic right MCA stroke [facility])  Hospital Problems: Principal Problem:   Acute ischemic right MCA stroke [facility])    Expected Discharge Date: Expected Discharge Date: 07/22/20  Team Members Present: Physician leading conference: Dr. Faith Rogue Care Coodinator Present: Cecile Sheerer, LCSWA;Clydell Sposito Marlyne Beards, RN, BSN, CRRN Nurse Present: Greta Doom, RN PT Present: Malachi Pro, PT OT Present: Primitivo Gauze, OT SLP Present: Feliberto Gottron, SLP PPS Coordinator present : Fae Pippin, [place], PT     Current Status/Progress Goal Weekly Team Focus  Bowel/Bladder   Pt is continent x2  Pt will remain continent  Asserss for incontinece q shift or as needed   Swallow/Nutrition/ Hydration   Dys. 3 textures with nectar-thick liquids, supervision  Supervision  trials of thin, repeat MBS   ADL's   Supervision overall except min for LB dressing, developing strength in LUE but is not able to functionally grasp and pull for pulling up clothing; mild apraxia  Mod I  ADL training, balance, LUE NMR, pt education   Mobility   independent bed mobility, transfers, supervision ambulation without AD, occsasional L toe drag  Independent  DC prep, family ed   Communication             Safety/Cognition/ Behavioral Observations  Supervision  Supervision  sustained attention, functional problem solving, anticipatory awareness   Pain   Pt have not C/O of pain  Pt will remain pain free  Assess for pain q shift or as needed.   Skin   Skin is intact  Skin will remain intact  Assess for rash and skin breakdown q shift or as needed.     Discharge Planning:  D/c to  home with 24/7 care from wife. Fam edu on 11/2 1pm-3pm with his wife Mark Oneal.   Team Discussion: Continent of bowel and bladder. Refusing pain patches and no complaints of pain. OT reports patient is supervision with self-care. Is regaining 40-50% usage of left hand but still has issues with left inattention. PT reports patient is supervision with ambulation. SLP reports patient is on Dys 3, nectar and will do a repeat MBS before discharge.  Patient on target to meet rehab goals: yes  *See Care Plan and progress notes for long and short-term goals.   Revisions to Treatment Plan:  None at this time.  Teaching Needs: Continue with family education.  Current Barriers to Discharge: Medication compliance  Possible Resolutions to Barriers: Continue current medication regimen.     Medical Summary Current Status: Right MCA infarct d/t ICA occlusion, right hemiparesis. bp controlled, sl elevation of LFT's  Barriers to Discharge: Medical stability   Possible Resolutions to [employer] Focus: daily vs review, regular lab and data assessment   Continued Need for Acute Rehabilitation Level of Care: The patient requires daily medical management by a physician with specialized training in physical medicine and rehabilitation for the following reasons: Direction of a multidisciplinary physical rehabilitation program to maximize functional independence : Yes Medical management of patient stability for increased activity during participation in an intensive rehabilitation regime.: Yes Analysis of laboratory values and/or radiology reports with any subsequent need for  medication adjustment and/or medical intervention. : Yes   I attest that I was present, lead the team conference, and concur with the assessment and plan of the team.   Tennis Must 07/18/2020, 3:40 PM

## 2020-07-18 NOTE — Plan of Care (Signed)
  Problem: Consults °Goal: RH STROKE PATIENT EDUCATION °Description: See Patient Education module for education specifics  °Outcome: Progressing °  °Problem: RH SAFETY °Goal: RH STG ADHERE TO SAFETY PRECAUTIONS W/ASSISTANCE/DEVICE °Description: STG Adhere to Safety Precautions With Assistance/Device. Mod I °Outcome: Progressing °  °Problem: RH KNOWLEDGE DEFICIT °Goal: RH STG INCREASE KNOWLEDGE OF STROKE PROPHYLAXIS °Description: Patient, family will be able to verbalize prevention of stroke including medication, diet, exercise, and lifestyle changes with cues, handout °Outcome: Progressing °  °

## 2020-07-18 NOTE — Progress Notes (Signed)
Speech Language Pathology Daily Session Note  Patient Details  Name: Mark Oneal MRN: [id number] Date of Birth: December 22, 1985  Today's Date: 07/18/2020 SLP Individual Time: 0720-0745 SLP Individual Time Calculation (min): 25 min  Short Term Goals: Week 1: SLP Short Term Goal 1 (Week 1): STG=LTG due to short estimated length of stay  Skilled Therapeutic Interventions: Skilled treatment session focused on dysphagia goals. SLP facilitated session by providing skilled observation with breakfast meal of Dys. 3 textures with nectar-thick liquids. Patient consumed meal without overt s/s of aspiration and was overall Mod I for use of swallowing compensatory strategies and tray set-up. Patient continues to require education regarding current swallowing impairments and clinical reasoning for thickened liquids. Repeat MBS tomorrow to assess swallow function and possible upgrade as it appears per patient report that patient plans on drinking thin liquids at home despite recommendations. Patient left supine in bed with alarm on and all needs within reach. Continue with current plan of care.      Pain No/Denies Pain   Therapy/Group: Individual Therapy  Mackenzye Mackel 07/18/2020, 7:50 AM

## 2020-07-18 NOTE — Progress Notes (Signed)
Patient ID: Mark Oneal, male   DOB: May 13, 1986, 34 y.o.   MRN: [id number]  SW met with pt and [name] in room utilizing [employer] Binnie Kand VE#[id number] to provide updates from team conference, and d/c date 11/6. SW explained charity DME and [facility] medication assistance program. SW informed pt wife there will be follow-up on if he will need a shower chair, and therapies needed. If pt needs shower chair, she states she will purchase. SW provided pt wife community resource: [facility] [place] for further follow-up care. Pt would like Rx filled here at hospital prior to d/c.   Loralee Pacas, MSW, [place] Office: [phone] Cell: [phone] Fax: [phone]

## 2020-07-19 ENCOUNTER — Inpatient Hospital Stay (HOSPITAL_COMMUNITY): Payer: Self-pay | Admitting: Occupational Therapy

## 2020-07-19 ENCOUNTER — Inpatient Hospital Stay (HOSPITAL_COMMUNITY): Payer: Self-pay

## 2020-07-19 ENCOUNTER — Encounter (HOSPITAL_COMMUNITY): Payer: Self-pay | Admitting: Speech Pathology

## 2020-07-19 ENCOUNTER — Inpatient Hospital Stay (HOSPITAL_COMMUNITY): Payer: Self-pay | Admitting: Speech Pathology

## 2020-07-19 MED ORDER — DOCUSATE SODIUM 100 MG PO CAPS
100.0000 mg | ORAL_CAPSULE | Freq: Two times a day (BID) | ORAL | Status: DC
  Administered 2020-07-19 – 2020-07-22 (×6): 100 mg via ORAL
  Filled 2020-07-19 (×6): qty 1

## 2020-07-19 NOTE — Progress Notes (Signed)
[place] PHYSICAL MEDICINE & REHABILITATION PROGRESS NOTE   Subjective/Complaints: No complaints this morning. Denies pain, constipation, insomnia.  Therapy is going well  ROS: Patient denies fever, rash, sore throat, blurred vision, nausea, vomiting, diarrhea, cough, shortness of breath or chest pain, joint or back pain, headache, or mood change.    Objective:   No results found. Recent Labs    07/17/20 0810  WBC 9.2  HGB 13.6  HCT 40.3  PLT 457*   Recent Labs    07/17/20 0810  NA 139  K 4.0  CL 103  CO2 25  GLUCOSE 97  BUN 12  CREATININE 0.89  CALCIUM 9.9    Intake/Output Summary (Last 24 hours) at 07/19/2020 0828 Last data filed at 07/19/2020 0819 Gross per 24 hour  Intake 560 ml  Output --  Net 560 ml        Physical Exam: Vital Signs Blood pressure 105/70, pulse (!) 51, temperature 99.1 F (37.3 C), temperature source Oral, resp. rate 18, height 5\' 4"  (1.626 m), weight 81.3 kg, SpO2 98 %. General: Alert and oriented x 3, No apparent distress HEENT: Head is normocephalic, atraumatic, PERRLA, EOMI, sclera anicteric, oral mucosa pink and moist, dentition intact, ext ear canals clear,  Neck: Supple without JVD or lymphadenopathy Heart: Bradycardic. No murmurs rubs or gallops Chest: CTA bilaterally without wheezes, rales, or rhonchi; no distress Abdomen: Soft, non-tender, non-distended, bowel sounds positive. Extremities: No clubbing, cyanosis, or edema. Pulses are 2+ Skin: Clean and intact without signs of breakdown Psych: pleasant and cooperative Neuro: good attention. Normal language. Reasonable insight and awareness. Very alert. Mild dysarthria  Mild central CN7. Sensory exam is normal. Reflexes are 1-2+ in all 4's. Fine motor coordination is intact. No tremors.  LUE 4/5 prox to distal. LLE 4/5 prox to distal. RUE and RLE 5/5.  Musculoskeletal: Full ROM, No pain with AROM or PROM in the neck, trunk, or extremities. Posture  appropriate    Assessment/Plan: 1. Functional deficits secondary to right MCA infarct which require 3+ hours per day of interdisciplinary therapy in a comprehensive inpatient rehab setting.  Physiatrist is providing close team supervision and 24 hour management of active medical problems listed below.  Physiatrist and rehab team continue to assess barriers to discharge/monitor patient progress toward functional and medical goals  Care Tool:  Bathing    Body parts bathed by patient: Left arm, Chest, Abdomen, Front perineal area, Buttocks, Right upper leg, Left upper leg, Right arm, Right lower leg, Left lower leg, Face   Body parts bathed by helper: Right arm, Right lower leg, Left lower leg, Face     Bathing assist Assist Level: Supervision/Verbal cueing     Upper Body Dressing/Undressing Upper body dressing   What is the patient wearing?: Pull over shirt    Upper body assist Assist Level: Supervision/Verbal cueing    Lower Body Dressing/Undressing Lower body dressing      What is the patient wearing?: Underwear/pull up, Pants     Lower body assist Assist for lower body dressing: Contact Guard/Touching assist     Toileting Toileting    Toileting assist Assist for toileting: Supervision/Verbal cueing Assistive Device Comment: urinal   Transfers Chair/bed transfer  Transfers assist     Chair/bed transfer assist level: Supervision/Verbal cueing     Locomotion Ambulation   Ambulation assist      Assist level: Supervision/Verbal cueing Assistive device: No Device Max distance: >300'   Walk 10 feet activity   Assist  Assist level: Supervision/Verbal cueing Assistive device: No Device   Walk 50 feet activity   Assist    Assist level: Supervision/Verbal cueing Assistive device: No Device    Walk 150 feet activity   Assist    Assist level: Supervision/Verbal cueing Assistive device: No Device    Walk 10 feet on uneven surface   activity   Assist     Assist level: Supervision/Verbal cueing     Wheelchair     Assist Will patient use wheelchair at discharge?: No             Wheelchair 50 feet with 2 turns activity    Assist            Wheelchair 150 feet activity     Assist          Blood pressure 105/70, pulse (!) 51, temperature 99.1 F (37.3 C), temperature source Oral, resp. rate 18, height 5\' 4"  (1.626 m), weight 81.3 kg, SpO2 98 %.  Medical Problem List and Plan: 1.  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 Stroke effected right basal ganglia, insula and anterior temporal lobe           -patient may shower             -ELOS/Goals: 8-12 days/Mod I/Supervision             continue PT, OT, SLP 2.  Antithrombotics: -DVT/anticoagulation:  Pharmaceutical: Lovenox             -antiplatelet therapy: DAPT x 3 weeks--d/c ASA 11/10 followed by Plavix alone 3. Pain Management: N/A 4. Mood: LCSW to follow for evaluation and support. Motivated to get better and back to his children.              -antipsychotic agents: N/A 5. Neuropsych: This patient is capable of making decisions on his own behalf. 6. Skin/Wound Care: Routine pressure relief measures.  7. Fluids/Electrolytes/Nutrition:   Encourage nectar liquids intake.     -labs from 11/1 stable  -mild elevation of LFT's from 10/28. Recheck Thursday  -albumin low, but pt continues to eat well 8. 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 No driving till seizure free X 6 months.  9. Aspiration pneumonia: On cefdinir completed 11/1  -afebrile, wbc's down to 9.3 on 10/28, 9.2 on 11/1. Breathing well.  10.  Post stroke dysphagia: On dysphagia 3, nectar liquids.   -continue current diet 11.  Acute blood loss anemia: Will monitor H/H for stability and monitor for signs of bleeding.              hgb up to 12.7 10/28, up to 13.6  on 11/1. 12.  Obesity             Encourage weight loss 13. Hypotension: asymptomatic. Monitor TID. Not on anti-hypertensives    LOS: 7 days A FACE TO FACE EVALUATION WAS PERFORMED  13/1 Kooper Godshall 07/19/2020, 8:28 AM

## 2020-07-19 NOTE — Progress Notes (Signed)
Speech Language Pathology Daily Session Note  Patient Details  Name: Trelyn Vanderlinde MRN: [id number] Date of Birth: 13-Dec-1985  Today's Date: 07/19/2020 SLP Individual Time: 1205-1230 SLP Individual Time Calculation (min): 25 min  Short Term Goals: Week 1: SLP Short Term Goal 1 (Week 1): STG=LTG due to short estimated length of stay  Skilled Therapeutic Interventions: Skilled treatment session focused on dysphagia goals. SLP facilitated session by providing skilled observation with upgraded lunch meal of Dys. 3 textures with thin liquids. Patient consumed meal without overt s/s of aspiration with supervision level verbal cues for use of a slow rate of self-feeding. Recommend patient continue current diet. Patient left upright in bed with alarm on and all needs within reach. Continue with current plan of care.      Pain No/Denies Pain   Therapy/Group: Individual Therapy  Davanta Meuser 07/19/2020, 12:50 PM

## 2020-07-19 NOTE — Progress Notes (Signed)
Physical Therapy Session Note  Patient Details  Name: Mark Oneal MRN: [id number] Date of Birth: July 21, 1986  Today's Date: 07/19/2020 PT Individual Time: 1448-1530 PT Individual Time Calculation (min): 42 min   Short Term Goals: Week 1:  PT Short Term Goal 1 (Week 1): STgs = LTGs due to ELOS  Skilled Therapeutic Interventions/Progress Updates:     Pt received supine in bed and agrees to therapy. Bed mobility independent during session. No complaint of pain.  Pt ambulates >500' during session with supervision and cues for L foot clearance. Pt demos decreased frequency of left toe drag and has slightly decreased gait speed.  Pt performs NMR for L upper extremity while ambulating, performing multitasking challenge by picking up bocce balls with L hand and carrying them across room to place in basket. PT cues for use of visual feedback to improve NM control. Activity starts with pt holding ball in preferred manner. Pt then performs with palm facing downward to increase muscular activation necessary to hold ball. Progression to transferring ball back and forth between L and R hand and then finally to transferring ball with L palm facing down. Pt also attempts tossing ball up with L hand and catching it while ambulating. Pt is able to complete max of 2 tosses in a row prior to dropping ball.  Pt stands on airex mat and uses L hand to grab clothespins of progressing resistance and place them on target. PT cues for pincer grasp and hand mechanics to improve performance.  Pt left supine in bed with alarm intact and all needs within reach.  Therapy Documentation Precautions:  Precautions Precautions: Fall Restrictions Weight Bearing Restrictions: No    Therapy/Group: Individual Therapy  Beau Fanny, PT, DPT 07/19/2020, 3:40 PM

## 2020-07-19 NOTE — Progress Notes (Addendum)
Modified Barium Swallow Progress Note  Patient Details  Name: Mark Oneal MRN: [id number] Date of Birth: Nov 22, 1985  Today's Date: 07/19/2020  Modified Barium Swallow completed.  Full report located under Chart Review in the Imaging Section.  Brief recommendations include the following:  Clinical Impression  Patient has a mild oropharyngeal dysphagia. Oral phase is characterized by reduced labial seal resulting in anterior loss of liquids with mild left buccal pocketing with solid textures. Intermittent premature spillage is noted and his pharyngeal swallow triggers at the pyriform sinuses with thin liquids resulting in flash penetration via cup and straw. No aspiration events were observed even when challenged with several large sequential sips.  Patient noted to have subtle throat clearing throughout study that did not appear to correlate with any episode of decreased airway protection. Recommend patient continue dys. 3 textures and upgrade to thin liquids. Educated patient on results and recommendations, he verbalized understanding.    Swallow Evaluation Recommendations       SLP Diet Recommendations: Dysphagia 3 (Mech soft) solids;Thin liquid   Liquid Administration via: Straw;Cup   Medication Administration: Whole meds with puree   Supervision: Full supervision/cueing for compensatory strategies;Patient able to self feed   Compensations: Lingual sweep for clearance of pocketing;Monitor for anterior loss;Slow rate;Small sips/bites;Minimize environmental distractions   Postural Changes: Seated upright at 90 degrees   Oral Care Recommendations: Oral care BID        Velena Keegan 07/19/2020,9:43 AM

## 2020-07-19 NOTE — Progress Notes (Signed)
Occupational Therapy Session Note  Patient Details  Name: Mark Oneal MRN: [id number] Date of Birth: 01-23-1986  Today's Date: 07/19/2020 OT Individual Time: 0700-0740 OT Individual Time Calculation (min): 40 min    Short Term Goals: Week 1:  OT Short Term Goal 1 (Week 1): STGS = LTGs  Skilled Therapeutic Interventions/Progress Updates:    1:1. Pt received in bed agreeable to OT reporting already changed clothing and no pain. Pt ambulates with distant supervision to bathroom to stand and void bladder, wash hands and groom at sink with set up with no VC for use of LUE in task. Pt unable to fasten shoes and Ot has pt WB in standing with LUE into shoe and pull out old shoe laces and install new elastic laces into shoes. OT fastens and pt not able to slip shoes on and off without A. Pt sits to work on placing pegs into peg board with coban applied to half to improve  Grip on slippery wooden pegs. Pt able to reach in diagonals and forward to obtain pegs with no shoulder height and min VC. Exited session with pt seated in bed, exit alarm on and call light in reach   Therapy Documentation Precautions:  Precautions Precautions: Fall Restrictions Weight Bearing Restrictions: No General:   Vital Signs: Therapy Vitals Temp: 99.1 F (37.3 C) Temp Source: Oral Pulse Rate: (!) 51 Resp: 18 BP: 105/70 Patient Position (if appropriate): Sitting Oxygen Therapy SpO2: 98 % O2 Device: Room Air Pain:   ADL: ADL Grooming: Supervision/safety Where Assessed-Grooming: Standing at sink Upper Body Bathing: Supervision/safety Where Assessed-Upper Body Bathing: Shower Lower Body Bathing: Supervision/safety Where Assessed-Lower Body Bathing: Shower Upper Body Dressing: Supervision/safety Where Assessed-Upper Body Dressing: Edge of bed Lower Body Dressing: Minimal assistance Where Assessed-Lower Body Dressing: Edge of bed Toileting: Supervision/safety Where Assessed-Toileting: Public house manager: Furniture conservator/restorer Method: Event organiser: Administrator, arts Method: Designer, industrial/product: Emergency planning/management officer, Scientist, research (medical)    Praxis   Exercises:   Other Treatments:     Therapy/Group: Individual Therapy  Shon Hale 07/19/2020, 6:59 AM

## 2020-07-19 NOTE — Progress Notes (Signed)
Occupational Therapy Session Note  Patient Details  Name: Mark Oneal MRN: [id number] Date of Birth: 08-May-1986  Today's Date: 07/19/2020 OT Individual Time: 1100-1200 OT Individual Time Calculation (min): 60 min    Short Term Goals: No STGs     Skilled Therapeutic Interventions/Progress Updates:    Pt received napping in bed. Pt agreeable to therapy. Ambulated to gym with S to focus on functional use and strength of LUE.  Grasp in R hand 75 lbs, L hand 40 lbs.  NMR: -standing fastening nuts and bolts of various sizes on bolt stand - L hand only pushing and pulling grocery cart with 50 lbs of weight -standing in front of mirror holding 4# dowel bar working on shoulder and chest presses followed by dead lifts. -seated clothespin squeezes -seated tricep dips with arm chair -seated [place] with placing and picking up small 1/4 inch pegs -quadriped push ups followed by 10 reps of 2 sets of pushups on knees -finger and hand strengthening with med soft theraputty  Pt needed cues 50% of the time to maintain focus on L hand due to motor impersistence.  He was asked to carry the small plastic container of theraputty in his L hand back to his room. He dropped the container 6 x for the first 25 steps and he was able to reach down and pick it up from the floor with left hand. For the rest of the walk he held his elbow flexed so he could focus on maintaining grasp on container.   Pt washed hands at sink and opted to lay down in bed again. Bed alarm set and all needs met.  Therapy Documentation Precautions:  Precautions Precautions: Fall Restrictions Weight Bearing Restrictions: No      Pain: Pain Assessment Pain Score: 0-No pain ADL: ADL Grooming: Supervision/safety Where Assessed-Grooming: Standing at sink Upper Body Bathing: Supervision/safety Where Assessed-Upper Body Bathing: Shower Lower Body Bathing: Supervision/safety Where Assessed-Lower Body Bathing: Shower Upper Body  Dressing: Supervision/safety Where Assessed-Upper Body Dressing: Edge of bed Lower Body Dressing: Minimal assistance Where Assessed-Lower Body Dressing: Edge of bed Toileting: Supervision/safety Where Assessed-Toileting: Glass blower/designer: Therapist, music Method: Magazine features editor: Curator Method: Heritage manager: Radio broadcast assistant, Grab bars   Therapy/Group: Individual Therapy  Marnesha Gagen 07/19/2020, 12:52 PM

## 2020-07-19 NOTE — Progress Notes (Signed)
Nutrition Follow-up  DOCUMENTATION CODES:   Obesity unspecified  INTERVENTION:   - Continue Magic Cup BID with meals, each supplement provides 290 kcal and 9 grams of protein  - Continue PM snack daily  - MVI with minerals daily  NUTRITION DIAGNOSIS:   Increased nutrient needs related to other (therapies) as evidenced by estimated needs.  Ongoing  GOAL:   Patient will meet greater than or equal to 90% of their needs  Progressing  MONITOR:   PO intake, Supplement acceptance, Diet advancement, Labs, Weight trends, Skin  REASON FOR ASSESSMENT:   Malnutrition Screening Tool    ASSESSMENT:   34 year old male who was admitted on 07/03/20 after a fall with resulting dysarthria and "jerking" of BUE/BLE. CT head showed hyperdense right MCA sign and CTA head/neck showed occlusion of right-CA 2 cm of the origin with reconstitution by PCA and subsequent occlusion of proximal right M1 MCA. Pt underwent cerebral angiogram with complete revascularization of occluded right ICA and ICA terminus, right MCA and right-ACA with question of proximal right ICA dissection. Pt admitted to CIR on 10/27.  11/03 - MBS with diet advanced to dysphagia 3 with thin liquids  Noted target d/c date of 11/06.  Reviewed weights in chart. Weight was 83.8 kg on admission and 81.3 kg on 10/28. No new weights since 10/28.  Pt was upgraded from nectar-thick liquids to thin liquids after MBS this morning. PO intake has remained good, averaging 80% at meals. PM snack and supplements ordered. Will continue with current regimen.  Meal Completion: 40-100% (averaging 80%)  Medications reviewed and include: colace, MVI, protonix, senna  Labs reviewed.  Diet Order:   Diet Order            DIET DYS 3 Room service appropriate? Yes with Assist; Fluid consistency: Thin  Diet effective now                 EDUCATION NEEDS:   Education needs have been addressed  Skin:  Skin Assessment: Skin Integrity  Issues: Incisions: groin  Last BM:  07/18/20  Height:   Ht Readings from Last 1 Encounters:  07/12/20 5\' 4"  (1.626 m)    Weight:   Wt Readings from Last 1 Encounters:  07/13/20 81.3 kg    Ideal Body Weight:  59.1 kg  BMI:  Body mass index is 30.77 kg/m.  Estimated Nutritional Needs:   Kcal:  2100-2300  Protein:  100-120 grams  Fluid:  >/= 2.0 L    07/15/20, MS, RD, LDN Inpatient Clinical Dietitian Please see [facility] for contact information.

## 2020-07-20 ENCOUNTER — Inpatient Hospital Stay (HOSPITAL_COMMUNITY): Payer: Self-pay | Admitting: Physical Therapy

## 2020-07-20 ENCOUNTER — Inpatient Hospital Stay (HOSPITAL_COMMUNITY): Payer: Self-pay | Admitting: Speech Pathology

## 2020-07-20 ENCOUNTER — Inpatient Hospital Stay (HOSPITAL_COMMUNITY): Payer: Self-pay | Admitting: Occupational Therapy

## 2020-07-20 ENCOUNTER — Inpatient Hospital Stay (HOSPITAL_COMMUNITY): Payer: Self-pay

## 2020-07-20 LAB — HEPATIC FUNCTION PANEL
ALT: 67 U/L — ABNORMAL HIGH (ref 0–44)
AST: 56 U/L — ABNORMAL HIGH (ref 15–41)
Albumin: 3.5 g/dL (ref 3.5–5.0)
Alkaline Phosphatase: 112 U/L (ref 38–126)
Bilirubin, Direct: <0.1 mg/dL (ref 0.0–0.2)
Total Bilirubin: 0.7 mg/dL (ref 0.3–1.2)
Total Protein: 7.4 g/dL (ref 6.5–8.1)

## 2020-07-20 NOTE — Progress Notes (Signed)
Occupational Therapy Session Note  Patient Details  Name: Mark Oneal MRN: [id number] Date of Birth: 16-Mar-1986  Today's Date: 07/20/2020 OT Individual Time: 9747-1855 OT Individual Time Calculation (min): 45 min    Short Term Goals: No short term goals set  Skilled Therapeutic Interventions/Progress Updates:    pt received in bed and agreeable to a shower today. Due to improvements in balance, pt showered in standing with no LOB. He will not need a shower chair at home.  He did need some cuing with turning water on and off but once in shower he was able to bathe independently using L arm to wash R.  He ambulated to bed to dress (S with ambulation) independently.  He is now able to pull pants over hips.    For the last 15 min of therapy session, focused on forced use of L hand and attention to hand to prevent motor impersistence.  -folding towels -placing pillow cases on pillows -putting his clean clothes in bags and carrying the bags to the side of the room -washing off his tray table with wash cloth -carrying cloth to laundry  Pt has pinch and grasp strength but continues to "let go" and drop items frequently needing cues to fully attend to his hand.  He sees his mistake, will try again without cues but it often takes him 4-5 trials before he is able to maintain his L hand grasp or pinch.  Pt resting at EOB with bed alarm on and call light in reach.   Pt has met most of his LTGs. Continuing to work on safety with transfers, L side coordination and strength, L side awareness. Pt will be ready for discharge on Saturday.  Therapy Documentation Precautions:  Precautions Precautions: Fall Restrictions Weight Bearing Restrictions: No  Pain: Pain Assessment Pain Score: 0-No pain ADL: ADL Grooming: Independent Where Assessed-Grooming: Standing at sink Upper Body Bathing: Independent Where Assessed-Upper Body Bathing: Shower Lower Body Bathing: Independent Where Assessed-Lower  Body Bathing: Shower Upper Body Dressing: Independent Where Assessed-Upper Body Dressing: Edge of bed Lower Body Dressing: Independent Where Assessed-Lower Body Dressing: Edge of bed Toileting: Independent Where Assessed-Toileting: Glass blower/designer: Distant supervision Armed forces technical officer Method: Magazine features editor: Distant supervision Social research officer, government Method: Heritage manager: Other (comment) ADL Comments: no equipment needed   Therapy/Group: Individual Therapy  Carleta Woodrow 07/20/2020, 9:06 AM

## 2020-07-20 NOTE — Progress Notes (Addendum)
Patient ID: Mark Oneal, male   DOB: 07/11/1986, 34 y.o.   MRN: [id number]  Pt set up for [employer] medication assistance program.   Cecile Sheerer, MSW, LCSWA Office: [phone] Cell: [phone] Fax: [phone]

## 2020-07-20 NOTE — Discharge Summary (Signed)
Physician Discharge Summary  Patient ID: Mark Oneal MRN: [id number] DOB/AGE: [birth year] 35 y.o.  Admit date: 07/12/2020 Discharge date: 07/22/2020  Discharge Diagnoses:  Principal Problem:   Acute ischemic right MCA stroke [facility]) Active Problems:   Seizures ([place])   Hyperlipidemia LDL goal <70   Hemi-inattention--left   Discharged Condition: stable   Significant Diagnostic Studies:  DG Swallowing Func-Speech Pathology  Result Date: 07/19/2020 Objective Swallowing Evaluation: Type of Study: MBS-Modified Barium Swallow Study  Patient Details Name: Mark Oneal MRN: [id number] Date of Birth: 04-15-1986 Today's Date: 07/19/2020 Time: SLP Start Time (ACUTE ONLY): 0900 -SLP Stop Time (ACUTE ONLY): 0925 SLP Time Calculation (min) (ACUTE ONLY): 25 min Past Medical History: Past Medical History: Diagnosis Date . 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 BUBBLE STUDY  07/06/2020  Procedure: BUBBLE STUDY;  Surgeon: Donato Heinz, MD;  Location: [facility] ENDOSCOPY;  Service: Cardiovascular;; . IR ANGIO INTRA EXTRACRAN SEL COM CAROTID INNOMINATE UNI L MOD SED  07/03/2020 . IR CT HEAD LTD  07/03/2020 . IR CT HEAD LTD  07/03/2020 . IR PERCUTANEOUS ART THROMBECTOMY/INFUSION INTRACRANIAL INC DIAG ANGIO  07/03/2020 . IR US GUIDE VASC ACCESS RIGHT  07/03/2020 . RADIOLOGY WITH ANESTHESIA N/A 07/03/2020  Procedure: IR WITH ANESTHESIA;  Surgeon: Radiologist, Medication, MD;  Location: [place];  Service: Radiology;  Laterality: N/A; . TEE WITHOUT CARDIOVERSION N/A 07/06/2020  Procedure: TRANSESOPHAGEAL ECHOCARDIOGRAM (TEE);  Surgeon: Donato Heinz, MD;  Location: [facility] ENDOSCOPY;  Service: Cardiovascular;  Laterality: N/A; HPI: See H&P  Subjective: more alert than this morning Assessment / Plan / Recommendation CHL IP CLINICAL IMPRESSIONS 07/19/2020 Clinical Impression Patient has a mild oropharyngeal dysphagia. Oral phase is  characterized by reduced labial seal resulting in anterior loss of liquids with mild left buccal pocketing with solid textures. Intermittent premature spillage is noted and his pharyngeal swallow triggers at the pyriform sinuses with thin liquids resulting in flash penetration via cup and straw. No aspiration events were observed even when challenged with several large sequential sips.  Patient noted to have subtle throat clearing throughout study that did not appear to correlate with any episode of decreased airway protection. Recommend patient continue dys. 3 textures and upgrade to thin liquids. Educated patient on results and recommendations, he verbalized understanding.  SLP Visit Diagnosis Dysphagia, oropharyngeal phase (R13.12) Attention and concentration deficit following -- Frontal lobe and executive function deficit following -- Impact on safety and function Mild aspiration risk   CHL IP TREATMENT RECOMMENDATION 07/19/2020 Treatment Recommendations Therapy as outlined in treatment plan below   Prognosis 07/19/2020 Prognosis for Safe Diet Advancement Good Barriers to Reach Goals -- Barriers/Prognosis Comment -- CHL IP DIET RECOMMENDATION 07/19/2020 SLP Diet Recommendations Dysphagia 3 (Mech soft) solids;Thin liquid Liquid Administration via Straw;Cup Medication Administration Whole meds with puree Compensations Lingual sweep for clearance of pocketing;Monitor for anterior loss;Slow rate;Small sips/bites;Minimize environmental distractions Postural Changes Seated upright at 90 degrees   CHL IP OTHER RECOMMENDATIONS 07/19/2020 Recommended Consults -- Oral Care Recommendations Oral care BID Other Recommendations --   CHL IP FOLLOW UP RECOMMENDATIONS 07/19/2020 Follow up Recommendations Inpatient Rehab   CHL IP FREQUENCY AND DURATION 07/19/2020 Speech Therapy Frequency (ACUTE ONLY) min 3x week Treatment Duration 1 week      CHL IP ORAL PHASE 07/19/2020 Oral Phase Impaired Oral - Pudding Teaspoon -- Oral - Pudding Cup  -- Oral - Honey Teaspoon -- Oral - Honey Cup -- Oral - Nectar Teaspoon -- Oral - Nectar Cup Left  anterior bolus loss Oral - Nectar Straw NT Oral - Thin Teaspoon -- Oral - Thin Cup Left anterior bolus loss;Premature spillage Oral - Thin Straw Premature spillage;Left anterior bolus loss Oral - Puree Reduced posterior propulsion Oral - Mech Soft Reduced posterior propulsion;Left pocketing in lateral sulci Oral - Regular -- Oral - Multi-Consistency -- Oral - Pill -- Oral Phase - Comment --  CHL IP PHARYNGEAL PHASE 07/19/2020 Pharyngeal Phase Impaired Pharyngeal- Pudding Teaspoon -- Pharyngeal -- Pharyngeal- Pudding Cup -- Pharyngeal -- Pharyngeal- Honey Teaspoon -- Pharyngeal -- Pharyngeal- Honey Cup -- Pharyngeal -- Pharyngeal- Nectar Teaspoon -- Pharyngeal -- Pharyngeal- Nectar Cup Delayed swallow initiation-vallecula;Penetration/Aspiration during swallow Pharyngeal Material enters airway, remains ABOVE vocal cords then ejected out Pharyngeal- Nectar Straw NT Pharyngeal -- Pharyngeal- Thin Teaspoon -- Pharyngeal -- Pharyngeal- Thin Cup Penetration/Aspiration during swallow;Delayed swallow initiation-pyriform sinuses Pharyngeal Material enters airway, remains ABOVE vocal cords then ejected out Pharyngeal- Thin Straw Penetration/Aspiration during swallow;Delayed swallow initiation-pyriform sinuses Pharyngeal Material enters airway, remains ABOVE vocal cords then ejected out Pharyngeal- Puree WFL Pharyngeal -- Pharyngeal- Mechanical Soft WFL Pharyngeal -- Pharyngeal- Regular -- Pharyngeal -- Pharyngeal- Multi-consistency -- Pharyngeal -- Pharyngeal- Pill -- Pharyngeal -- Pharyngeal Comment --  CHL IP CERVICAL ESOPHAGEAL PHASE 07/19/2020 Cervical Esophageal Phase WFL Pudding Teaspoon -- Pudding Cup -- Honey Teaspoon -- Honey Cup -- Nectar Teaspoon -- Nectar Cup -- Nectar Straw -- Thin Teaspoon -- Thin Cup -- Thin Straw -- Puree -- Mechanical Soft -- Regular -- Multi-consistency -- Pill -- Cervical Esophageal Comment --  PAYNE, COURTNEY 07/19/2020, 9:44 AM    Weston Anna, MA, CCC-SSLP   Labs:  Basic Metabolic Panel: BMP Latest Ref Rng & Units 07/17/2020 07/13/2020 07/11/2020  Glucose 70 - 99 mg/dL 97 113(H) 111(H)  BUN 6 - 20 mg/dL _0 Creatinine 0.61 - 1.24 mg/dL 0.89 0.76 0.92  Sodium 135 - 145 mmol/L 139 137 135  Potassium 3.5 - 5.1 mmol/L 4.0 3.8 3.8  Chloride 98 - 111 mmol/L 103 102 100  CO2 22 - 32 mmol/L 25 21(L) 26  Calcium 8.9 - 10.3 mg/dL 9.9 9.3 9.0    CBC: CBC Latest Ref Rng & Units 07/17/2020 07/13/2020 07/11/2020  WBC 4.0 - 10.5 K/uL 9.2 9.3 12.3(H)  Hemoglobin 13.0 - 17.0 g/dL 13.6 12.7(L) 11.6(L)  Hematocrit 39 - 52 % 40.3 35.2(L) 33.2(L)  Platelets 150 - 400 K/uL 457(H) 373 259   Hepatic Function Latest Ref Rng & Units 07/20/2020 07/13/2020 07/03/2020  Total Protein 6.5 - 8.1 g/dL 7.4 7.8 7.4  Albumin 3.5 - 5.0 g/dL 3.5 3.3(L) 4.4  AST 15 - 41 U/L 56(H) 72(H) 39  ALT 0 - 44 U/L 67(H) 57(H) 36  Alk Phosphatase 38 - 126 U/L 112 123 86  Total Bilirubin 0.3 - 1.2 mg/dL 0.7 1.0 0.7  Bilirubin, Direct 0.0 - 0.2 mg/dL <0.1 - -   CBG: No results for input(s): GLUCAP in the last 168 hours.  Brief HPI:   Mark Oneal is a 34 y.o. male with history of prior R-SCA infarct who was admitted on 07/03/2020 after a fall with resultant dysarthria and jerking of BUE/BLE.  On evaluation by EMS he was found to have right gaze deviation and left hemiparesis.  He was loaded with Keppra with recommendations to continue this due to seizure at stroke onset and in CT.   CT head showed hyperdense right MCA sign and CTA head/neck showed occlusion of right ICA 2 cm from origin with reconstitution by PCA and subsequent occlusion of  proximal right M1 MCA.  He underwent cerebral angiogram with complete revascularization of occluded right ICA and ICA terminus, right MCA and right ACA with question of proximal right ICA dissection.    Follow-up CT head showed some contrast staining with question of bleed and  mild cerebral edema therefore was started on hypertonic saline.EEG was negative for seizures.  TEE was negative for PFO or cardiac source of emboli.  Dr. Leonie Man felt the stroke was embolic due to unknown source versus possible dissection and recommended continuing DAPT x3 weeks followed by Plavix alone.  Hospital course significant for issues with leukocytosis as well as low-grade fevers with tachypnea likely due to aspiration pneumonia and he was started on cefdinir on 10/26 for treatment.  Also noted to have dysphagia requiring D III, nectar liquids.  Therapy ongoing and CIR was recommended due to functional decline.   Hospital Course: Mark Oneal was admitted to rehab 07/12/2020 for inpatient therapies to consist of PT, ST and OT at least three hours five days a week. Past admission physiatrist, therapy team and rehab RN have worked together to provide customized collaborative inpatient rehab.  His blood pressures were monitored on TID basis and have been controlled.  Swallow function is improved and he has been advanced to dysphagia three thin liquids.  P.o. intake has been good and he is continent of bowel and bladder.  He has been seizure-free during his stay.  Follow-up CBC shows acute blood loss anemia and leukocytosis has resolved.Marland Kitchen  He completed his antibiotic course for treatment of aspiration pneumonia on on 11/1. He has been afebrile and respiratory status has been stable. Check of CMET showed mild elevation in LFTs otherwise WNL.  Headaches are controlled.  He has made good gains during his rehab stay with improvement in attention to side and is currently at independent level.  He was set up with match and has received 1 month supply of medications.  He will continue to receive outpatient occupational therapy at [facility] outpatient rehab after discharge   Rehab course: During patient's stay in rehab weekly team conferences were held to monitor patient's progress, set goals and discuss barriers to  discharge. At admission, patient required min assist with ADL tasks and supervision with mobility.  He exhibited mild to moderate oropharyngeal dysphagia with left pocketing as well as mild cognitive deficits affecting emergent/anticipatory awareness, attention and problem-solving. He  has had improvement in activity tolerance, balance, postural control as well as ability to compensate for deficits. He has had improvement in functional use LUE  and LLE as well as improvement in awareness.  He is able to complete ADL tasks at modified independent level.  He does continue to have some mild inattention to LUE.  He is modified independent for transfers and to ambulate 500 feet without assistive device.  Family education was completed with wife.  Disposition: Home  Diet: Dysphagia 3, thin liquids.   Special Instructions: 1.  No driving till seizure free for 6 months or till cleared by Neurology.  2.  Repeat LFTs in 3-4 weeks to monitor for stability due to statin on board. 3. Discontinue ASA after 5 days.    Discharge Instructions    Ambulatory referral to Neurology   Complete by: As directed    An appointment is requested in approximately: 2-3 weeks follow up   Ambulatory referral to Physical Medicine Rehab   Complete by: As directed    1-2 weeks TC appt     Allergies as of 07/22/2020  No Known Allergies     Medication List    STOP taking these medications   acetaminophen 325 MG tablet Commonly known as: TYLENOL   cefdinir 300 MG capsule Commonly known as: OMNICEF   diclofenac 1.3 % Ptch Commonly known as: FLECTOR   docusate 50 MG/5ML liquid Commonly known as: COLACE Replaced by: docusate sodium 100 MG capsule   enoxaparin 40 MG/0.4ML injection Commonly known as: LOVENOX   lidocaine 5 % Commonly known as: LIDODERM   polyethylene glycol 17 g packet Commonly known as: MIRALAX / GLYCOLAX   Resource ThickenUp Clear Powd   sodium chloride 0.9 % infusion     TAKE these  medications   aspirin 81 MG chewable tablet Chew 1 tablet (81 mg total) by mouth daily. Notes to patient: Stop this after 5 days   atorvastatin 80 MG tablet Commonly known as: LIPITOR Take 1 tablet (80 mg total) by mouth daily. Notes to patient: For cholesterol control.    clopidogrel 75 MG tablet Commonly known as: PLAVIX Take 1 tablet (75 mg total) by mouth daily. Notes to patient: Blood thinner to prevent strokes.    docusate sodium 100 MG capsule Commonly known as: COLACE Take 1 capsule (100 mg total) by mouth 2 (two) times daily. Replaces: docusate 50 MG/5ML liquid Notes to patient: For constipation   levETIRAcetam 500 MG tablet Commonly known as: KEPPRA Take 1 tablet (500 mg total) by mouth 2 (two) times daily.   multivitamin with minerals Tabs tablet Take 1 tablet by mouth daily.   pantoprazole 40 MG tablet Commonly known as: PROTONIX Take 1 tablet (40 mg total) by mouth daily at 12 noon. Notes to patient: To help with indigestion   senna-docusate 8.6-50 MG tablet Commonly known as: Senokot-S Take 1 tablet by mouth 2 (two) times daily. What changed: Another medication with the same name was removed. Continue taking this medication, and follow the directions you see here. Notes to patient: To help with constipation--can stop if you start having too many BMs or loose stools.        Follow-up Information    Joelene Millin, MD Follow up.   Specialty: Pediatrics       Ranell Patrick Clide Deutscher, MD Follow up.   Specialty: Physical Medicine and Rehabilitation Why: Office will call you with follow up appointment Contact information: [address] [place] [address] [phone]        [facility]. Call.   Why: for stroke/seizure follow up Contact information: [address]     [place] [address] [phone]              Signed: Bary Leriche 07/24/2020, 3:28 PM

## 2020-07-20 NOTE — Progress Notes (Signed)
Speech Language Pathology Daily Session Note  Patient Details  Name: Mark Oneal MRN: [id number] Date of Birth: 1986/01/17  Today's Date: 07/20/2020 SLP Individual Time: 1030-1055 SLP Individual Time Calculation (min): 25 min  Short Term Goals: Week 1: SLP Short Term Goal 1 (Week 1): STG=LTG due to short estimated length of stay  Skilled Therapeutic Interventions: Skilled treatment session focused on dysphagia and cognitive goals. SLP facilitated session by providing skilled observation with thin liquids via straw. Patient consumed trials without overt s/s of aspiration, therefore, recommend patient continue current diet. SLP also facilitated session by providing overall Min verbal cues for completion of visual scanning tasks with single stimuli with 9 omissions noted. However, after compensatory strategies, patient only made 1 omission with multiple stimuli. Patient left upright in bed with alarm on and all needs within reach. Continue with current plan of care.      Pain No/Denies Pain   Therapy/Group: Individual Therapy  [name] 07/20/2020, 2:59 PM

## 2020-07-20 NOTE — Progress Notes (Signed)
Physical Therapy Session Note  Patient Details  Name: Mark Oneal MRN: [id number] Date of Birth: Mar 04, 1986  Today's Date: 07/20/2020 PT Individual Time: 1424-1520 PT Individual Time Calculation (min): 56 min   Short Term Goals: Week 1:  PT Short Term Goal 1 (Week 1): STgs = LTGs due to ELOS  Skilled Therapeutic Interventions/Progress Updates:    Pt received supine in bed and agreeable to therapy session though reporting feeling "tired." Supine>sitting L EOB, HOB elevated, mod-I. Sit<>stands, no AD, independently during session. Gait training ~260ft, no AD, to ADL apartment kitchen, with supervision for safety but no instability noted and pt clearing L foot consistently during swing. Sit<>stand furniture transfer on couch independently despite low surface.   Performed gait training in ADL apartment kitchen while targeting dual-task training, L UE NMR, dynamic balance with frequent turns and head rotations, along with L attention to locate and collect fruit. Required close supervision for balance but no instances of instability noted. Required max cuing to use L hand to open/close cabinets/drawers/appliances - pt often leaves doors slightly open requiring cuing to close completely. Pt demos significant improvement in ability to perform dual-task via sustaining grasp on fruit while walking over to collect it in the fruit bowel. Min cuing throughout for L attention to locate all items in room.  Gait training to ortho gym, no AD, with supervision. Standing L UE NMR and L attention task using dynavision with 1lb weight on L wrist - had 10% green lights to avoid pressing as a cognitive challenge - cuing throughout for pt to push buttons with pointer finger (not knuckle or thumb) - average reaction time of 1.52seconds with most missed lights in L lower quadrant.   Dynamic standing balance and L UE NMR task via playing horseshoes with close supervision for balance - placed horseshoes behind pt on his left  and he repeatedly started to turn R to look for them demoing L inattention, cuing to correct. Had pt collect horseshoes from ground using L hand, pt using R hand on wall to stabilize himself in squat/crouched position. Bimanual task via cleaning horseshoes with pt noted to having decreased movement in L wrist/arm/hand to incorporate it into the task (only used hand to hold horseshoes still).  Gait training ~140ft to main therapy gym, supervision. Standing tolerance and L UE NMR task using nuts and bolts working on in-hand manipulation to collect 5 bolts into hand 1 at a time and then place them back on the table 1 at a time from a pincer grasp - screwing nuts on varying size bolts, more difficulty maintaining grasp on the heavier nuts to start threading it onto the bolt without dropping it requires ~17minutes to perform - cuing throughout for more normalized hand movement and to avoid compensatory strategies. Gait ~293ft back to room, no AD, with supervision and again no instability noted. Sit>supine independently. Pt left supine in bed with needs in reach and bed alarm on.  Therapy Documentation Precautions:  Precautions Precautions: Fall Restrictions Weight Bearing Restrictions: No  Pain: Denies pain during session.   Therapy/Group: Individual Therapy  Ginny Forth , PT, DPT, CSRS  07/20/2020, 12:09 PM

## 2020-07-20 NOTE — Progress Notes (Signed)
[place] PHYSICAL MEDICINE & REHABILITATION PROGRESS NOTE   Subjective/Complaints: Pt up with OT. About to get in the shower. No complaints. Happy about progress. OT notes that he still drops things when carrying them with left hand d/t inattention  ROS: Patient denies fever, rash, sore throat, blurred vision, nausea, vomiting, diarrhea, cough, shortness of breath or chest pain, joint or back pain, headache, or mood change.   Objective:   DG Swallowing Func-Speech Pathology  Result Date: 07/19/2020 Objective Swallowing Evaluation: Type of Study: MBS-Modified Barium Swallow Study  Patient Details Name: Mark PillingKevin Oneal MRN: [id number] Date of Birth: 07/17/1986 Today's Date: 07/19/2020 Time: SLP Start Time (ACUTE ONLY): 0900 -SLP Stop Time (ACUTE ONLY): 0925 SLP Time Calculation (min) (ACUTE ONLY): 25 min Past Medical History: Past Medical History: Diagnosis Date . 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 BUBBLE STUDY  07/06/2020  Procedure: BUBBLE STUDY;  Surgeon: Little IshikawaSchumann, Christopher L, MD;  Location: [facility];  Service: Cardiovascular;; . IR ANGIO INTRA EXTRACRAN SEL COM CAROTID INNOMINATE UNI L MOD SED  07/03/2020 . IR CT HEAD LTD  07/03/2020 . IR CT HEAD LTD  07/03/2020 . IR PERCUTANEOUS ART THROMBECTOMY/INFUSION INTRACRANIAL INC DIAG ANGIO  07/03/2020 . IR US GUIDE VASC ACCESS RIGHT  07/03/2020 . RADIOLOGY WITH ANESTHESIA N/A 07/03/2020  Procedure: IR WITH ANESTHESIA;  Surgeon: Radiologist, Medication, MD;  Location: [facility] OR;  Service: Radiology;  Laterality: N/A; . TEE WITHOUT CARDIOVERSION N/A 07/06/2020  Procedure: TRANSESOPHAGEAL ECHOCARDIOGRAM (TEE);  Surgeon: Little IshikawaSchumann, Christopher L, MD;  [facility];  Service: Cardiovascular;  Laterality: N/A; HPI: See H&P  Subjective: more alert than this morning Assessment / Plan / Recommendation CHL IP CLINICAL IMPRESSIONS 07/19/2020 Clinical Impression Patient has a  mild oropharyngeal dysphagia. Oral phase is characterized by reduced labial seal resulting in anterior loss of liquids with mild left buccal pocketing with solid textures. Intermittent premature spillage is noted and his pharyngeal swallow triggers at the pyriform sinuses with thin liquids resulting in flash penetration via cup and straw. No aspiration events were observed even when challenged with several large sequential sips.  Patient noted to have subtle throat clearing throughout study that did not appear to correlate with any episode of decreased airway protection. Recommend patient continue dys. 3 textures and upgrade to thin liquids. Educated patient on results and recommendations, he verbalized understanding.  SLP Visit Diagnosis Dysphagia, oropharyngeal phase (R13.12) Attention and concentration deficit following -- Frontal lobe and executive function deficit following -- Impact on safety and function Mild aspiration risk   CHL IP TREATMENT RECOMMENDATION 07/19/2020 Treatment Recommendations Therapy as outlined in treatment plan below   Prognosis 07/19/2020 Prognosis for Safe Diet Advancement Good Barriers to Reach Goals -- Barriers/Prognosis Comment -- CHL IP DIET RECOMMENDATION 07/19/2020 SLP Diet Recommendations Dysphagia 3 (Mech soft) solids;Thin liquid Liquid Administration via Straw;Cup Medication Administration Whole meds with puree Compensations Lingual sweep for clearance of pocketing;Monitor for anterior loss;Slow rate;Small sips/bites;Minimize environmental distractions Postural Changes Seated upright at 90 degrees   CHL IP OTHER RECOMMENDATIONS 07/19/2020 Recommended Consults -- Oral Care Recommendations Oral care BID Other Recommendations --   CHL IP FOLLOW UP RECOMMENDATIONS 07/19/2020 Follow up Recommendations Inpatient Rehab   CHL IP FREQUENCY AND DURATION 07/19/2020 Speech Therapy Frequency (ACUTE ONLY) min 3x week Treatment Duration 1 week      CHL IP ORAL PHASE 07/19/2020 Oral Phase Impaired  Oral - Pudding Teaspoon -- Oral - Pudding Cup -- Oral - Honey Teaspoon -- Oral -  Honey Cup -- Oral - Nectar Teaspoon -- Oral - Nectar Cup Left anterior bolus loss Oral - Nectar Straw NT Oral - Thin Teaspoon -- Oral - Thin Cup Left anterior bolus loss;Premature spillage Oral - Thin Straw Premature spillage;Left anterior bolus loss Oral - Puree Reduced posterior propulsion Oral - Mech Soft Reduced posterior propulsion;Left pocketing in lateral sulci Oral - Regular -- Oral - Multi-Consistency -- Oral - Pill -- Oral Phase - Comment --  CHL IP PHARYNGEAL PHASE 07/19/2020 Pharyngeal Phase Impaired Pharyngeal- Pudding Teaspoon -- Pharyngeal -- Pharyngeal- Pudding Cup -- Pharyngeal -- Pharyngeal- Honey Teaspoon -- Pharyngeal -- Pharyngeal- Honey Cup -- Pharyngeal -- Pharyngeal- Nectar Teaspoon -- Pharyngeal -- Pharyngeal- Nectar Cup Delayed swallow initiation-vallecula;Penetration/Aspiration during swallow Pharyngeal Material enters airway, remains ABOVE vocal cords then ejected out Pharyngeal- Nectar Straw NT Pharyngeal -- Pharyngeal- Thin Teaspoon -- Pharyngeal -- Pharyngeal- Thin Cup Penetration/Aspiration during swallow;Delayed swallow initiation-pyriform sinuses Pharyngeal Material enters airway, remains ABOVE vocal cords then ejected out Pharyngeal- Thin Straw Penetration/Aspiration during swallow;Delayed swallow initiation-pyriform sinuses Pharyngeal Material enters airway, remains ABOVE vocal cords then ejected out Pharyngeal- Puree WFL Pharyngeal -- Pharyngeal- Mechanical Soft WFL Pharyngeal -- Pharyngeal- Regular -- Pharyngeal -- Pharyngeal- Multi-consistency -- Pharyngeal -- Pharyngeal- Pill -- Pharyngeal -- Pharyngeal Comment --  CHL IP CERVICAL ESOPHAGEAL PHASE 07/19/2020 Cervical Esophageal Phase WFL Pudding Teaspoon -- Pudding Cup -- Honey Teaspoon -- Honey Cup -- Nectar Teaspoon -- Nectar Cup -- Nectar Straw -- Thin Teaspoon -- Thin Cup -- Thin Straw -- Puree -- Mechanical Soft -- Regular -- Multi-consistency  -- Pill -- Cervical Esophageal Comment -- PAYNE, COURTNEY 07/19/2020, 9:44 AM    Feliberto Gottron, MA, CCC-SLP [phone]           No results for input(s): WBC, HGB, HCT, PLT in the last 72 hours. No results for input(s): NA, K, CL, CO2, GLUCOSE, BUN, CREATININE, CALCIUM in the last 72 hours.  Intake/Output Summary (Last 24 hours) at 07/20/2020 0955 Last data filed at 07/20/2020 0810 Gross per 24 hour  Intake 360 ml  Output --  Net 360 ml        Physical Exam: Vital Signs Blood pressure 106/64, pulse (!) 56, temperature 98.2 F (36.8 C), temperature source Oral, resp. rate 16, height 5\' 4"  (1.626 m), weight 81.3 kg, SpO2 96 %. Constitutional: No distress . Vital signs reviewed. HEENT: EOMI, oral membranes moist Neck: supple Cardiovascular: RRR without murmur. No JVD    Respiratory/Chest: CTA Bilaterally without wheezes or rales. Normal effort    GI/Abdomen: BS +, non-tender, non-distended Ext: no clubbing, cyanosis, or edema Psych: pleasant and cooperative Skin: Clean and intact without signs of breakdown Psych: pleasant and cooperative Neuro: good attention. Normal language. Reasonable insight and awareness. Very alert. Attended to me on left.  Sensory exam is normal. Reflexes are 1-2+ in all 4's. Fine motor coordination is intact. No tremors.  LUE 4/5 prox to distal. LLE 4/5 prox to distal. RUE and RLE 5/5.  Musculoskeletal: normal posture and ROM.    Assessment/Plan: 1. Functional deficits secondary to right MCA infarct which require 3+ hours per day of interdisciplinary therapy in a comprehensive inpatient rehab setting.  Physiatrist is providing close team supervision and 24 hour management of active medical problems listed below.  Physiatrist and rehab team continue to assess barriers to discharge/monitor patient progress toward functional and medical goals  Care Tool:  Bathing    Body parts bathed by patient: Left arm, Chest, Abdomen, Front perineal area, Buttocks,  Right upper leg,  Left upper leg, Right arm, Right lower leg, Left lower leg, Face   Body parts bathed by helper: Right arm, Right lower leg, Left lower leg, Face     Bathing assist Assist Level: Independent     Upper Body Dressing/Undressing Upper body dressing   What is the patient wearing?: Pull over shirt    Upper body assist Assist Level: Independent    Lower Body Dressing/Undressing Lower body dressing      What is the patient wearing?: Underwear/pull up, Pants     Lower body assist Assist for lower body dressing: Independent     Toileting Toileting    Toileting assist Assist for toileting: Independent Assistive Device Comment: urinal   Transfers Chair/bed transfer  Transfers assist     Chair/bed transfer assist level: Supervision/Verbal cueing     Locomotion Ambulation   Ambulation assist      Assist level: Supervision/Verbal cueing Assistive device: No Device Max distance: >300'   Walk 10 feet activity   Assist     Assist level: Supervision/Verbal cueing Assistive device: No Device   Walk 50 feet activity   Assist    Assist level: Supervision/Verbal cueing Assistive device: No Device    Walk 150 feet activity   Assist    Assist level: Independent Assistive device: No Device    Walk 10 feet on uneven surface  activity   Assist     Assist level: Supervision/Verbal cueing     Wheelchair     Assist Will patient use wheelchair at discharge?: No             Wheelchair 50 feet with 2 turns activity    Assist            Wheelchair 150 feet activity     Assist          Blood pressure 106/64, pulse (!) 56, temperature 98.2 F (36.8 C), temperature source Oral, resp. rate 16, height 5\' 4"  (1.626 m), weight 81.3 kg, SpO2 96 %.  Medical Problem List and Plan: 1.  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 Stroke  effected right basal ganglia, insula and anterior temporal lobe           -patient may shower             -ELOS/Goals: 11/6             continue PT, OT, SLP 2.  Antithrombotics: -DVT/anticoagulation:  Pharmaceutical: Lovenox             -antiplatelet therapy: DAPT x 3 weeks--d/c ASA 11/10 followed by Plavix alone 3. Pain Management: N/A 4. Mood: LCSW to follow for evaluation and support. Motivated to get better and back to his children.              -antipsychotic agents: N/A 5. Neuropsych: This patient is capable of making decisions on his own behalf. 6. Skin/Wound Care: Routine pressure relief measures.  7. Fluids/Electrolytes/Nutrition:   Encourage nectar liquids intake.     -labs from 11/1 stable  -mild elevation of LFT's from 10/28. Persistent/not increased 11/4  -pt on statin, may have to consider adjustment    -would follow up LFT's as outpt b/f any changes 8. 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 No driving till seizure free X 6 months.  9. Aspiration pneumonia: On cefdinir completed 11/1  -afebrile, wbc's down to 9.3 on 10/28, 9.2 on 11/1. Breathing  well.  10.  Post stroke dysphagia: On dysphagia 3, nectar liquids.   -continue current diet 11.  Acute blood loss anemia: Will monitor H/H for stability and monitor for signs of bleeding.              hgb up to 12.7 10/28, up to 13.6 on 11/1. 12. Hypotension: asymptomatic. Monitor TID. Not on anti-hypertensives    LOS: 8 days A FACE TO FACE EVALUATION WAS PERFORMED  Ranelle Oyster 07/20/2020, 9:55 AM

## 2020-07-20 NOTE — Progress Notes (Signed)
Physical Therapy Session Note  Patient Details  Name: Mark Oneal MRN: [id number] Date of Birth: 1986-03-11  Today's Date: 07/20/2020 PT Individual Time: 5427-0623 PT Individual Time Calculation (min): 54 min   Short Term Goals: Week 1:  PT Short Term Goal 1 (Week 1): STgs = LTGs due to ELOS  Skilled Therapeutic Interventions/Progress Updates:     Pt received supine in bed and agrees to therapy. Reports no pain. Supine to sit independently. Sit to stand independently. Pt ambulates >500' without AD. PT provides supervision and cues to increase trunk rotation and L arm swing, gait speed, L step height and providing gait exercises including high knees and glute kicks 1x20.  Pt performs NMR for standing balance with Wii balance board. PT provides multimodal cueing to improve pt's weight shifting and utilize hip strategy, as pt demos tendency to use shoulders for weight shifting rather than lower body. Pt is able to perform all dynamic balance activities without LOB.  NMR include L upper extremity with Wii bowling game. Pt cued to use L hand for bowling game and PT provides modA for fine motor manipulation and sequencing/timing to facilitate motor planning. Pt demos improvement with increased time, though still frequently attempting to use R hand to assist rather than using L.  Pt left supine in bed with alarm intact and all needs within reach.   Therapy Documentation Precautions:  Precautions Precautions: Fall Restrictions Weight Bearing Restrictions: No :      Therapy/Group: Individual Therapy  Beau Fanny, PT, DPT 07/20/2020, 7:56 AM

## 2020-07-21 ENCOUNTER — Inpatient Hospital Stay: Payer: Self-pay

## 2020-07-21 ENCOUNTER — Inpatient Hospital Stay (HOSPITAL_COMMUNITY): Payer: Self-pay | Admitting: Physical Therapy

## 2020-07-21 ENCOUNTER — Inpatient Hospital Stay (HOSPITAL_COMMUNITY): Payer: Self-pay | Admitting: Occupational Therapy

## 2020-07-21 ENCOUNTER — Other Ambulatory Visit (HOSPITAL_COMMUNITY): Payer: Self-pay | Admitting: Physical Medicine and Rehabilitation

## 2020-07-21 ENCOUNTER — Inpatient Hospital Stay (HOSPITAL_COMMUNITY): Payer: Self-pay | Admitting: Speech Pathology

## 2020-07-21 ENCOUNTER — Inpatient Hospital Stay (HOSPITAL_COMMUNITY): Payer: Self-pay

## 2020-07-21 DIAGNOSIS — Z23 Encounter for immunization: Secondary | ICD-10-CM

## 2020-07-21 DIAGNOSIS — R414 Neurologic neglect syndrome: Secondary | ICD-10-CM

## 2020-07-21 MED ORDER — ATORVASTATIN CALCIUM 80 MG PO TABS: 80.0000 mg | ORAL_TABLET | Freq: Every day | ORAL | Status: DC

## 2020-07-21 MED ORDER — LEVETIRACETAM 500 MG PO TABS: 500.0000 mg | ORAL_TABLET | Freq: Two times a day (BID) | ORAL | 0 refills | Status: DC

## 2020-07-21 MED ORDER — ASPIRIN 81 MG PO CHEW: 81.0000 mg | CHEWABLE_TABLET | Freq: Every day | ORAL | 0 refills | Status: DC

## 2020-07-21 MED ORDER — DICLOFENAC SODIUM 1 % EX GEL
4.0000 g | Freq: Four times a day (QID) | CUTANEOUS | Status: DC
  Filled 2020-07-21: qty 100

## 2020-07-21 MED ORDER — DICLOFENAC EPOLAMINE 1.3 % EX PTCH: 1.0000 | MEDICATED_PATCH | Freq: Two times a day (BID) | CUTANEOUS | 0 refills | Status: DC

## 2020-07-21 MED ORDER — PANTOPRAZOLE SODIUM 40 MG PO TBEC: 40.0000 mg | DELAYED_RELEASE_TABLET | Freq: Every day | ORAL | 0 refills | Status: DC

## 2020-07-21 MED ORDER — ADULT MULTIVITAMIN W/MINERALS CH: 1.0000 | ORAL_TABLET | Freq: Every day | ORAL | 0 refills | Status: AC

## 2020-07-21 MED ORDER — DOCUSATE SODIUM 100 MG PO CAPS: 100.0000 mg | ORAL_CAPSULE | Freq: Two times a day (BID) | ORAL | 0 refills | Status: DC

## 2020-07-21 MED ORDER — DICLOFENAC SODIUM 1 % EX GEL: 4.0000 g | Freq: Four times a day (QID) | CUTANEOUS | Status: DC

## 2020-07-21 MED ORDER — CLOPIDOGREL BISULFATE 75 MG PO TABS: 75.0000 mg | ORAL_TABLET | Freq: Every day | ORAL | 0 refills | Status: DC

## 2020-07-21 MED ORDER — ATORVASTATIN CALCIUM 80 MG PO TABS: 80.0000 mg | ORAL_TABLET | Freq: Every day | ORAL | 0 refills | Status: DC

## 2020-07-21 MED FILL — ATORVASTATIN CALCIUM 80 MG: 80 | 30 days supply | Qty: 30 | Fill #0

## 2020-07-21 MED FILL — DOCUSATE SODIUM 100 MG CAPS: 100 | 30 days supply | Qty: 60 | Fill #0

## 2020-07-21 MED FILL — CLOPIDOGREL 75 MG TABLET: 75 | 30 days supply | Qty: 30 | Fill #0

## 2020-07-21 MED FILL — PANTOPRAZOLE SOD DR 40 MG T: 40 | 30 days supply | Qty: 30 | Fill #0

## 2020-07-21 MED FILL — levETIRAcetam 500 MG TABS: 500 | 30 days supply | Qty: 60 | Fill #0

## 2020-07-21 NOTE — Progress Notes (Signed)
Occupational Therapy Discharge Summary  Patient Details  Name: Mark Oneal MRN: [id number] Date of Birth: 11-29-1985     Patient has met 59 of 11 long term goals due to improved activity tolerance, improved balance, postural control, ability to compensate for deficits, functional use of  LEFT upper and LEFT lower extremity, improved attention, improved awareness and improved coordination.  Patient to discharge at overall Independent level.  Patient's care partner is independent to provide the necessary physical assistance at discharge.    Reasons goals not met: n/a  Recommendation:  Patient will benefit from ongoing skilled OT services in outpatient setting to continue to advance functional skills in the area of BADL, iADL and Vocation. 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  Equipment: No equipment provided  Reasons for discharge: treatment goals met  Patient/family agrees with progress made and goals achieved: Yes  OT Discharge Precautions/Restrictions  Precautions Precautions: Fall Precaution Comments: Left inattention Restrictions Weight Bearing Restrictions: No ADL ADL Eating: Independent Grooming: Independent Where Assessed-Grooming: Standing at sink Upper Body Bathing: Independent Where Assessed-Upper Body Bathing: Shower Lower Body Bathing: Independent Where Assessed-Lower Body Bathing: Shower Upper Body Dressing: Independent Where Assessed-Upper Body Dressing: Edge of bed Lower Body Dressing: Independent Where Assessed-Lower Body Dressing: Edge of bed Toileting: Independent Where Assessed-Toileting: Glass blower/designer: Programmer, applications Method: Magazine features editor: IT consultant Method: Heritage manager: Other (comment) ADL Comments: no equipment needed Vision Baseline Vision/History: No visual deficits Patient Visual Report: No change from  baseline Vision Assessment?: No apparent visual deficits Eye Alignment: Within Functional Limits Perception  Perception: Impaired Inattention/Neglect: Does not attend to left visual field;Does not attend to left side of body (this has improved a great deal from admission, but he continues to have mild inattention to L hand) Praxis Praxis: Impaired Praxis Impairment Details: Motor planning Praxis-Other Comments: Improved from eval Cognition Overall Cognitive Status: Within Functional Limits for tasks assessed Arousal/Alertness: Awake/alert Orientation Level: Oriented X4 Safety/Judgment: Appears intact Sensation Sensation Light Touch: Appears Intact Stereognosis: Impaired by gross assessment Additional Comments: sterognosis mildly impaired due to limited attention to L hand, pt states he can feel objects in his hand Coordination Gross Motor Movements are Fluid and Coordinated: No Fine Motor Movements are Fluid and Coordinated: No Coordination and Movement Description: pt is now using L hand at a diminished level but actively in all tasks Finger Nose Finger Test: slow and delayed on L Motor  Motor Motor: Hemiplegia Motor - Skilled Clinical Observations: L upper extremity more impaired than L lower extremity. Improved form eval. Mobility  Bed Mobility Bed Mobility: Supine to Sit;Sit to Supine Supine to Sit: Independent Sit to Supine: Independent Transfers Sit to Stand: Independent Stand to Sit: Independent  Trunk/Postural Assessment  Cervical Assessment Cervical Assessment: Within Functional Limits Thoracic Assessment Thoracic Assessment: Within Functional Limits Lumbar Assessment Lumbar Assessment: Within Functional Limits Postural Control Postural Control: Within Functional Limits  Balance Balance Balance Assessed: Yes Standardized Balance Assessment Standardized Balance Assessment: Berg Balance Test;Timed Up and Go Test Berg Balance Test Sit to Stand: Able to stand  without using hands and stabilize independently Standing Unsupported: Able to stand safely 2 minutes Sitting with Back Unsupported but Feet Supported on Floor or Stool: Able to sit safely and securely 2 minutes Stand to Sit: Sits safely with minimal use of hands Transfers: Able to transfer safely, minor use of hands Standing Unsupported with Eyes Closed: Able to stand 10 seconds safely  Standing Ubsupported with Feet Together: Able to place feet together independently and stand 1 minute safely From Standing, Reach Forward with Outstretched Arm: Can reach confidently >25 cm (10") From Standing Position, Pick up Object from Floor: Able to pick up shoe safely and easily From Standing Position, Turn to Look Behind Over each Shoulder: Looks behind from both sides and weight shifts well Turn 360 Degrees: Able to turn 360 degrees safely in 4 seconds or less Standing Unsupported, Alternately Place Feet on Step/Stool: Able to stand independently and safely and complete 8 steps in 20 seconds Standing Unsupported, One Foot in Front: Able to place foot tandem independently and hold 30 seconds Standing on One Leg: Able to lift leg independently and hold > 10 seconds Total Score: 56 Timed Up and Go Test TUG: Normal TUG Normal TUG (seconds): 8.6 Static Sitting Balance Static Sitting - Level of Assistance: 7: Independent Dynamic Sitting Balance Dynamic Sitting - Level of Assistance: 7: Independent Static Standing Balance Static Standing - Level of Assistance: 7: Independent Dynamic Standing Balance Dynamic Standing - Level of Assistance: 7: Independent Extremity/Trunk Assessment RUE Assessment RUE Assessment: Within Functional Limits LUE Assessment Passive Range of Motion (PROM) Comments: WFL Active Range of Motion (AROM) Comments: pt now has 150 degrees of sh flexion vs 75 on admission General Strength Comments: 45 lbs of grasp strength on L (vs 80 on R)   [place] 07/21/2020, 2:13 PM

## 2020-07-21 NOTE — Progress Notes (Signed)
[place] PHYSICAL MEDICINE & REHABILITATION PROGRESS NOTE   Subjective/Complaints: No new problems. Using left upper ext more to carry, manipulate.   ROS: Patient denies fever, rash, sore throat, blurred vision, nausea, vomiting, diarrhea, cough, shortness of breath or chest pain, joint or back pain, headache, or mood change.   Objective:   No results found. No results for input(s): WBC, HGB, HCT, PLT in the last 72 hours. No results for input(s): NA, K, CL, CO2, GLUCOSE, BUN, CREATININE, CALCIUM in the last 72 hours.  Intake/Output Summary (Last 24 hours) at 07/21/2020 1133 Last data filed at 07/21/2020 0951 Gross per 24 hour  Intake 837 ml  Output --  Net 837 ml        Physical Exam: Vital Signs Blood pressure 91/70, pulse 65, temperature 98 F (36.7 C), temperature source Oral, resp. rate 20, height 5\' 4"  (1.626 m), weight 81.3 kg, SpO2 98 %. Constitutional: No distress . Vital signs reviewed. HEENT: EOMI, oral membranes moist Neck: supple Cardiovascular: RRR without murmur. No JVD    Respiratory/Chest: CTA Bilaterally without wheezes or rales. Normal effort    GI/Abdomen: BS +, non-tender, non-distended Ext: no clubbing, cyanosis, or edema Psych: pleasant and cooperative Skin: Clean and intact without signs of breakdown Psych: pleasant and cooperative Neuro:  . Normal language. Reasonable insight and awareness. Very alert. Sensory exam is normal. Reflexes are 1-2+ in all 4's. Fine motor coordination is intact. No tremors.  LUE 4/5 prox to distal. Grip strength improving. Better attention to the left. LLE 4/5 prox to distal. RUE and RLE 5/5.  Musculoskeletal: normal posture and ROM.    Assessment/Plan: 1. Functional deficits secondary to right MCA infarct which require 3+ hours per day of interdisciplinary therapy in a comprehensive inpatient rehab setting.  Physiatrist is providing close team supervision and 24 hour management of active medical problems listed  below.  Physiatrist and rehab team continue to assess barriers to discharge/monitor patient progress toward functional and medical goals  Care Tool:  Bathing    Body parts bathed by patient: Left arm, Chest, Abdomen, Front perineal area, Buttocks, Right upper leg, Left upper leg, Right arm, Right lower leg, Left lower leg, Face   Body parts bathed by helper: Right arm, Right lower leg, Left lower leg, Face     Bathing assist Assist Level: Independent     Upper Body Dressing/Undressing Upper body dressing   What is the patient wearing?: Pull over shirt    Upper body assist Assist Level: Independent    Lower Body Dressing/Undressing Lower body dressing      What is the patient wearing?: Underwear/pull up, Pants     Lower body assist Assist for lower body dressing: Independent     Toileting Toileting    Toileting assist Assist for toileting: Independent Assistive Device Comment: urinal   Transfers Chair/bed transfer  Transfers assist     Chair/bed transfer assist level: Supervision/Verbal cueing     Locomotion Ambulation   Ambulation assist      Assist level: Supervision/Verbal cueing Assistive device: No Device Max distance: 220ft   Walk 10 feet activity   Assist     Assist level: Supervision/Verbal cueing Assistive device: No Device   Walk 50 feet activity   Assist    Assist level: Supervision/Verbal cueing Assistive device: No Device    Walk 150 feet activity   Assist    Assist level: Supervision/Verbal cueing Assistive device: No Device    Walk 10 feet on uneven surface  activity  Assist     Assist level: Supervision/Verbal cueing     Wheelchair     Assist Will patient use wheelchair at discharge?: No             Wheelchair 50 feet with 2 turns activity    Assist            Wheelchair 150 feet activity     Assist          Blood pressure 91/70, pulse 65, temperature 98 F (36.7 C),  temperature source Oral, resp. rate 20, height 5\' 4"  (1.626 m), weight 81.3 kg, SpO2 98 %.  Medical Problem List and Plan: 1.  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 Stroke effected right basal ganglia, insula and anterior temporal lobe           -patient may shower             -ELOS/Goals: 11/6             continue PT, OT, SLP 2.  Antithrombotics: -DVT/anticoagulation:  Pharmaceutical: Lovenox             -antiplatelet therapy: DAPT x 3 weeks--d/c ASA 11/10 followed by Plavix alone 3. Pain Management: N/A 4. Mood: LCSW to follow for evaluation and support. Motivated to get better and back to his children.              -antipsychotic agents: N/A 5. Neuropsych: This patient is capable of making decisions on his own behalf. 6. Skin/Wound Care: Routine pressure relief measures.  7. Fluids/Electrolytes/Nutrition:   Encourage nectar liquids intake.     7. Transaminitis -mild elevation of LFT's from 10/28. Persistent 11/4  -pt on statin, may have to consider adjustment    -would follow up LFT's as outpt b/f making any changes 8. Witnessed Seizure: To continue Keppra till follow up with neurology. No driving till seizure free X 6 months.  9. Aspiration pneumonia: On cefdinir completed 11/1  -afebrile, wbc's down to 9.3 on 10/28, 9.2 on 11/1. Breathing well.  10.  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   -continue current diet 11.  Acute blood loss anemia: Will monitor H/H for stability and monitor for signs of bleeding.              hgb up to 12.7 10/28, up to 13.6 on 11/1. 12. Hypotension: asymptomatic. Monitor TID. Not on anti-hypertensives    LOS: 9 days A FACE TO FACE EVALUATION WAS PERFORMED  13/1 07/21/2020, 11:33 AM

## 2020-07-21 NOTE — Progress Notes (Signed)
Physical Therapy Session Note  Patient Details  Name: Mark Oneal MRN: [id number] Date of Birth: 09/23/1985  Today's Date: 07/21/2020 PT Individual Time: 0900-0945 PT Individual Time Calculation (min): 45 min   Short Term Goals: Week 1:  PT Short Term Goal 1 (Week 1): STgs = LTGs due to ELOS  Skilled Therapeutic Interventions/Progress Updates:    pt received in bed and agreeable to therapy. Pt directed in supine>sit independent, donned Bilateral shoes in sitting position mod I. Pt directed in Sit to stand from bed mod I  Without AD and directed in gait training with NMRE component of navigating environment off unit with use of elevator to large open distracting environment with nursing aware and agreeable, with pt ambulating over 500' with directions for left side item seeking and counting throughout task for improved awareness and balance righting, frequent directional changes and cadence changes at grossly, SBA. Pt directed in returning to unit with elevator use, SBA, standing throughout without AD. Pt also directed in ascending/descending full flight of steps with one handrail at SBA, reciprocal step pattern. Pt directed in NMRE activity with stepping pattern with agility ladder for forward, backward steps, lateral, and diagonal steps x2 each SBA. Pt directed in step ups with 7# hand weights on Bilateral upper  extremity 2x10 with squat between each step up at [facility] for safety with weight on Left upper extremity. Pt directed in x10 horseshoe retrieval in gym from various surfaces and heights including ground level at SBA. Pt directed in dynamic standing balance training in hall with walking 100' no AD, SBA with high knees for that distance, lateral squatted gait for 50' x2, 100' with head turns in vertical and horizontal planes. Pt then returned to room, SBA and sit>supine mod I. Pt mod I for all transfers during session.   Therapy Documentation Precautions:  Precautions Precautions:  Fall Restrictions Weight Bearing Restrictions: No General:   Vital Signs:   Pain: Pain Assessment Pain Scale: 0-10 Pain Score: 0-No pain    Therapy/Group: Individual Therapy  Barbaraann Faster 07/21/2020, 10:33 AM

## 2020-07-21 NOTE — Progress Notes (Signed)
Patient ID: Mark Oneal, male   DOB: July 08, 1986, 34 y.o.   MRN: [id number]  SW met with pt in room to provide updates using [employer] Spanish Interpreter Mark Oneal [phone] to inform on outpatient therapies needed at discharge. Pt agreeable. SW explained he will need to discuss payment arrangement and request financial assistance. During visit, attempts were made to contact pt wife Mark Oneal but no answer.  SW faxed referral to [facility] (p:[phone]/f:[phone]).    Loralee Pacas, MSW, [place] Office: [phone] Cell: [phone] Fax: [phone]

## 2020-07-21 NOTE — Progress Notes (Signed)
Speech Language Pathology Discharge Summary  Patient Details  Name: Mark Oneal MRN: [id number] Date of Birth: November 09, 1985  Today's Date: 07/21/2020 SLP Individual Time: 5631-4970 SLP Individual Time Calculation (min): 25 min   Skilled Therapeutic Interventions:  Skilled treatment session focused on dysphagia goals. SLP facilitated session by providing skilled observation with thin liquids. Patient consumed liquids via straw without overt s/s of aspiration. Recommend patient continue current diet. SLP also review handout that provided education regarding appropriate textures and food options for home. Patient verbalized understanding. Patient left upright in bed with all needs within reach.     Patient has met 6 of 6 long term goals.  Patient to discharge at overall Supervision level.   Reasons goals not met: N/A   Clinical Impression/Discharge Summary: Patient has made excellent gains and has met 6 of 6 LTGs this admission. Currently, patient is consuming Dys. 3 textures with thin liquids with minimal overt s/s of aspiration and is overall Mod I for use of swallowing compensatory strategies. Patient also requires overall supervision level verbal cues to complete functional and mildly complex tasks safely in regards to attention, awareness and problem solving. Patient education is complete and patient will discharge home with 24 hour assistance from family. Patient would benefit from f/u SLP services to maximize his swallowing and cognitive functioning in order to maximize his overall functional independence and reduce caregiver burden.   Care Partner:  Caregiver Able to Provide Assistance: Yes  Type of Caregiver Assistance: Cognitive  Recommendation:  24 hour supervision/assistance;Outpatient SLP  Rationale for SLP Follow Up: Maximize cognitive function and independence;Maximize swallowing safety   Equipment: N/A   Reasons for discharge: Treatment goals met   Patient/Family Agrees with  Progress Made and Goals Achieved: Yes    Nolen Lindamood, [place] 07/21/2020, 6:20 AM

## 2020-07-21 NOTE — Progress Notes (Signed)
Inpatient Rehabilitation Care Coordinator  Discharge Note  The overall goal for the admission was met for:   Discharge location: Yes. D/c to home with wife.  Length of Stay: Yes. 9 days.   Discharge activity level: Yes. Mod I without Assistive Device.   Home/community participation: Yes. Limited  Services provided included: MD, RD, PT, OT, SLP, RN, CM, TR, Pharmacy, Neuropsych and SW  Financial Services: Other: Uninsured  Follow-up services arranged: Outpatient: Outpatient PT/OT at [facility] and DME: No DME needed  Comments (or additional information): contact [name] [phone] (spanish Interpreter required)  Patient/Family verbalized understanding of follow-up arrangements: Yes  Individual responsible for coordination of the follow-up plan: Pt to have assistance with coordinating care needs.   Confirmed correct DME delivered: Rana Snare 07/21/2020    Loralee Pacas, MSW, [place] Office: [phone] Cell: [phone] Fax: [phone]

## 2020-07-21 NOTE — Discharge Instructions (Signed)
Inpatient Rehab Discharge Instructions  Mark Oneal Discharge date and time: 07/22/20   Activities/Precautions/ Functional Status: Activity: no lifting, driving, or strenuous exercise  till cleared by MD Diet: low fat, low cholesterol diet Wound Care: none needed    Functional status:  ___ No restrictions     ___ Walk up steps independently ___ 24/7 supervision/assistance   ___ Walk up steps with assistance ___ Intermittent supervision/assistance  ___ Bathe/dress independently ___ Walk with walker     ___ Bathe/dress with assistance ___ Walk Independently    ___ Shower independently ___ Walk with assistance    _X__ Shower with assistance _X__ No alcohol     ___ Return to work/school ________  COMMUNITY REFERRALS UPON DISCHARGE:    Outpatient:   OT    ST             Agency: [facility]  Phone: [phone]             Appointment Date/Time:*Please expect follow-up within 7-10 business days to schedule your appointment. If you have not received follow-up, be sure to contact site directly.*  Medical Equipment/Items Ordered: No equipment recommended.                                                  Agency/Supplier: N/A  GENERAL COMMUNITY RESOURCES FOR PATIENT/FAMILY: Please be sure to establish care with [facility] for on-going medical assistance. Call #[phone].    Special Instructions: 1. Stop taking aspirin after 11/10. 2. Needs to pay attention to the left side. 3. No driving for 6 months (have to be seizure free and need clearance from Neurology)  Per [facility] statutes, patients with seizures are not allowed to drive until  they have been seizure-free for six months. Use caution when using heavy equipment or power tools. Avoid working on ladders or at heights. Take showers instead of baths. Ensure the water temperature is not too high on the home water heater. Do not go swimming alone. When caring for infants or small children, sit  down when holding, feeding, or changing them to minimize risk of injury to the child in the event you have a seizure. Also, Maintain good sleep hygiene. Avoid alcohol.     STROKE/TIA DISCHARGE INSTRUCTIONS SMOKING Cigarette smoking nearly doubles your risk of having a stroke & is the single most alterable risk factor  If you smoke or have smoked in the last 12 months, you are advised to quit smoking for your health.  Most of the excess cardiovascular risk related to smoking disappears within a year of stopping.  Ask you doctor about anti-smoking medications  [place] Quit Line: 1-800-QUIT NOW  Free Smoking Cessation Classes [phone]  CHOLESTEROL Know your levels; limit fat & cholesterol in your diet  Lipid Panel     Component Value Date/Time   CHOL 293 (H) 07/04/2020 0358   TRIG 479 (H) 07/06/2020 0542   HDL 29 (L) 07/04/2020 0358   CHOLHDL 10.1 07/04/2020 0358   VLDL UNABLE TO CALCULATE IF TRIGLYCERIDE OVER 400 mg/dL 73/22/0254 2706   LDLCALC UNABLE TO CALCULATE IF TRIGLYCERIDE OVER 400 mg/dL 23/76/2831 5176      Many patients benefit from treatment even if their cholesterol is at goal.  Goal: Total Cholesterol (CHOL) less than 160  Goal:  Triglycerides (TRIG) less than 150  Goal:  HDL greater than 40  Goal:  LDL (LDLCALC) less than 100   BLOOD PRESSURE American Stroke Association blood pressure target is less that 120/80 mm/Hg  Your discharge blood pressure is:  BP: 108/71  Monitor your blood pressure  Limit your salt and alcohol intake  Many individuals will require more than one medication for high blood pressure  DIABETES (A1c is a blood sugar average for last 3 months) Goal HGBA1c is under 7% (HBGA1c is blood sugar average for last 3 months)  Diabetes: No known diagnosis of diabetes    Lab Results  Component Value Date   HGBA1C 5.6 07/04/2020     Your HGBA1c can be lowered with medications, healthy diet, and exercise.  Check your blood sugar as directed by  your physician  Call your physician if you experience unexplained or low blood sugars.  PHYSICAL ACTIVITY/REHABILITATION Goal is 30 minutes at least 4 days per week  Activity: No driving, Therapies:  See above Return to work: To be decided after follow up in office.   Activity decreases your risk of heart attack and stroke and makes your heart stronger.  It helps control your weight and blood pressure; helps you relax and can improve your mood.  Participate in a regular exercise program.  Talk with your doctor about the best form of exercise for you (dancing, walking, swimming, cycling).  DIET/WEIGHT Goal is to maintain a healthy weight  Your discharge diet is:  Diet Order            DIET DYS 3 Room service appropriate? Yes with Assist; Fluid consistency: Thin  Diet effective now               liquids Your height is:  Height: 5\' 4"  (162.6 cm) Your current weight is: Weight: 179 lbs Your Body Mass Index (BMI) is:  BMI (Calculated): 30.75  Following the type of diet specifically designed for you will help prevent another stroke.  Your goal weight is 145 lbs  Your goal Body Mass Index (BMI) is 19-24.  Healthy food habits can help reduce 3 risk factors for stroke:  High cholesterol, hypertension, and excess weight.  RESOURCES Stroke/Support Group:  Call [phone]   STROKE EDUCATION PROVIDED/REVIEWED AND GIVEN TO PATIENT Stroke warning signs and symptoms How to activate emergency medical system (call 911). Medications prescribed at discharge. Need for follow-up after discharge. Personal risk factors for stroke. Pneumonia vaccine given:  Flu vaccine given:  My questions have been answered, the writing is legible, and I understand these instructions.  I will adhere to these goals & educational materials that have been provided to me after my discharge from the hospital.        My questions have been answered and I understand these instructions. I will adhere to these goals  and the provided educational materials after my discharge from the hospital.  Patient/Caregiver Signature _______________________________ Date __________  Clinician Signature _______________________________________ Date __________  Please bring this form and your medication list with you to all your follow-up doctor's appointments.

## 2020-07-21 NOTE — Progress Notes (Signed)
Occupational Therapy Session Note  Patient Details  Name: Mark Oneal MRN: [id number] Date of Birth: [birth year]  Today's Date: 07/21/2020 OT Individual Time: 1015-1100 OT Individual Time Calculation (min): 45 min    Short Term Goals: No short term goals set  Skilled Therapeutic Interventions/Progress Updates:    pt completed toileting, shower and dressing all at an independent level today.  LUE NMR: -Picking up and carrying towels from bathroom to laundry hamper -carrying a canvas grocery bag in L hand with 12 lbs of wt up and down the hallway -carrying a ball in L hand and every 10 steps stopping to bounce and catch ball -arm crank in standing with level 4 resistance for 5 minutes  Pt did not drop anything today and now has 45 lbs grasp in left hand.    He participated well. Given HEP in Spanish for theraputty and encouraged pt to do laundry, washing dishes, etc at home to further facilitate his LUE function.   Pt is now independent in his room and no alarms needed.   Therapy Documentation Precautions:  Precautions Precautions: Fall Precaution Comments: Left inattention Restrictions Weight Bearing Restrictions: No   Pain: Pain Assessment Pain Score: 0-No pain ADL: ADL Eating: Independent Grooming: Independent Where Assessed-Grooming: Standing at sink Upper Body Bathing: Independent Where Assessed-Upper Body Bathing: Shower Lower Body Bathing: Independent Where Assessed-Lower Body Bathing: Shower Upper Body Dressing: Independent Where Assessed-Upper Body Dressing: Edge of bed Lower Body Dressing: Independent Where Assessed-Lower Body Dressing: Edge of bed Toileting: Independent Where Assessed-Toileting: Teacher, adult education: Community education officer Method: Nutritional therapist Transfer: Psychologist, counselling Method: Designer, industrial/product: Other (comment) ADL Comments: no equipment needed   Therapy/Group: Individual  Therapy  Zivah Mayr 07/21/2020, 2:04 PM

## 2020-07-21 NOTE — Progress Notes (Signed)
Physical Therapy Discharge Summary  Patient Details  Name: Mark Oneal MRN: [id number] Date of Birth: 1986-09-07  Today's Date: 07/21/2020 PT Individual Time: 6962-9528 PT Individual Time Calculation (min): 71 min    Patient has met 7 of 7 long term goals due to improved balance, increased strength, improved attention and improved coordination.  Patient to discharge at an ambulatory level Independent.   Patient's care partner is independent to provide the necessary physical assistance at discharge.  Reasons goals not met: NA  Recommendation:  Patient will not require PT following DC due to achieving goals of independence with mobility.  Equipment: No equipment provided  Reasons for discharge: treatment goals met and discharge from hospital  Patient/family agrees with progress made and goals achieved: Yes   Skilled Therapeutic Interventions:  Pt received supine in bed and agrees to therapy. No complaint of pain. Bed mobility independent. Pt ambulates >500' throughout session. Car transfer and ramp navigation independent. Pt completes 12 steps with no handrails independently. Pt completes [employer], as detailed below. Pt completes 15 minutes on Nustep at workload of 4 with steps per minute > 50 throughout. Pt does not requires L hand splint for Nustep activity, demonstrating improvement from earlier in rehab stay. Pt completes tasks in virtual apartment using L hand, such as reaching for plateware, silverware, and napkins and setting table. Pt also performs furniture transfers independently.   Pt left supine in bed with all needs wihin reach.   PT Discharge Precautions/Restrictions Precautions Precautions: Fall Precaution Comments: Left inattention Restrictions Weight Bearing Restrictions: No Vision/Perception  Praxis Praxis: Impaired Praxis Impairment Details: Motor planning Praxis-Other Comments: Improved from eval  Cognition Overall Cognitive Status: Within  Functional Limits for tasks assessed Arousal/Alertness: Awake/alert Orientation Level: Oriented X4 Safety/Judgment: Appears intact Sensation Sensation Light Touch: Appears Intact Coordination Gross Motor Movements are Fluid and Coordinated: No Fine Motor Movements are Fluid and Coordinated: No Motor  Motor Motor: Hemiplegia Motor - Skilled Clinical Observations: L upper extremity more impaired than L lower extremity. Improved form eval.  Mobility Bed Mobility Bed Mobility: Supine to Sit;Sit to Supine Supine to Sit: Independent Sit to Supine: Independent Transfers Transfers: Sit to Stand;Stand to [employer] Transfers Sit to Stand: Independent Stand to Sit: Independent Stand Pivot Transfers: Independent Transfer (Assistive device): None Locomotion  Gait Ambulation: Yes Gait Assistance: Independent Gait Distance (Feet): 500 Feet Assistive device: None Gait Gait: Yes Gait Pattern: Within Functional Limits Stairs / Additional Locomotion Stairs: Yes Stairs Assistance: Independent Stair Management Technique: Two rails Number of Stairs: 12 Height of Stairs: 6 Ramp: Independent Curb: Independent Wheelchair Mobility Wheelchair Mobility: No  Trunk/Postural Assessment  Cervical Assessment Cervical Assessment: Within Functional Limits Thoracic Assessment Thoracic Assessment: Within Functional Limits Lumbar Assessment Lumbar Assessment: Within Functional Limits Postural Control Postural Control: Within Functional Limits  Balance Balance Balance Assessed: Yes Standardized Balance Assessment Standardized Balance Assessment: Berg Balance Test;Timed Up and Go Test Berg Balance Test Sit to Stand: Able to stand without using hands and stabilize independently Standing Unsupported: Able to stand safely 2 minutes Sitting with Back Unsupported but Feet Supported on Floor or Stool: Able to sit safely and securely 2 minutes Stand to Sit: Sits safely with minimal use of  hands Transfers: Able to transfer safely, minor use of hands Standing Unsupported with Eyes Closed: Able to stand 10 seconds safely Standing Ubsupported with Feet Together: Able to place feet together independently and stand 1 minute safely From Standing, Reach Forward with Outstretched Arm: Can reach confidently >25  cm (10") From Standing Position, Pick up Object from Floor: Able to pick up shoe safely and easily From Standing Position, Turn to Look Behind Over each Shoulder: Looks behind from both sides and weight shifts well Turn 360 Degrees: Able to turn 360 degrees safely in 4 seconds or less Standing Unsupported, Alternately Place Feet on Step/Stool: Able to stand independently and safely and complete 8 steps in 20 seconds Standing Unsupported, One Foot in Front: Able to place foot tandem independently and hold 30 seconds Standing on One Leg: Able to lift leg independently and hold > 10 seconds Total Score: 56 Timed Up and Go Test TUG: Normal TUG Normal TUG (seconds): 8.6 Static Sitting Balance Static Sitting - Level of Assistance: 7: Independent Dynamic Sitting Balance Dynamic Sitting - Level of Assistance: 7: Independent Static Standing Balance Static Standing - Level of Assistance: 7: Independent Dynamic Standing Balance Dynamic Standing - Level of Assistance: 7: Independent Extremity Assessment  RLE Assessment RLE Assessment: Within Functional Limits LLE Assessment LLE Assessment: Within Functional Limits    Breck Coons, PT, DPT 07/21/2020, 3:35 PM

## 2020-07-21 NOTE — Progress Notes (Signed)
   Covid-19 Vaccination Clinic  Name:  Mark Oneal    MRN: [id number] DOB: 12-08-1985  07/21/2020  [name] was observed post Covid-19 immunization for 15 minutes without incident. He was provided with Vaccine Information Sheet and instruction to access the V-Safe system.   [name] was instructed to call 911 with any severe reactions post vaccine: Marland Kitchen Difficulty breathing  . Swelling of face and throat  . A fast heartbeat  . A bad rash all over body  . Dizziness and weakness   Immunizations Administered    Name Date Dose VIS Date Route   JANSSEN COVID-19 VACCINE 07/21/2020 12:19 PM 0.5 mL 07/05/2020 Intramuscular   Manufacturer: [employer]   Lot: [id number]   NDC: 01007-121-97

## 2020-07-22 NOTE — Progress Notes (Signed)
[facility] PHYSICAL MEDICINE & REHABILITATION PROGRESS NOTE   Subjective/Complaints: No complaints this morning. Denies pain, constipation, insomnia.  Works in [employer], would like to return in 1 month. Asks about how long it will take for him to recover.  ROS: Patient denies fever, rash, sore throat, blurred vision, nausea, vomiting, diarrhea, cough, shortness of breath or chest pain, joint or back pain, headache, or mood change.   Objective:   No results found. No results for input(s): WBC, HGB, HCT, PLT in the last 72 hours. No results for input(s): NA, K, CL, CO2, GLUCOSE, BUN, CREATININE, CALCIUM in the last 72 hours.  Intake/Output Summary (Last 24 hours) at 07/22/2020 0946 Last data filed at 07/22/2020 0715 Gross per 24 hour  Intake 818 ml  Output --  Net 818 ml        Physical Exam: Vital Signs Blood pressure 104/65, pulse 71, temperature 98.7 F (37.1 C), resp. rate 18, height 5\' 4"  (1.626 m), weight 81.3 kg, SpO2 98 %.  General: Alert and oriented x 3, No apparent distress HEENT: Head is normocephalic, atraumatic, PERRLA, EOMI, sclera anicteric, oral mucosa pink and moist, dentition intact, ext ear canals clear,  Neck: Supple without JVD or lymphadenopathy Heart: Reg rate and rhythm. No murmurs rubs or gallops Chest: CTA bilaterally without wheezes, rales, or rhonchi; no distress Abdomen: Soft, non-tender, non-distended, bowel sounds positive.  Ext: no clubbing, cyanosis, or edema Psych: pleasant and cooperative Skin: Clean and intact without signs of breakdown Psych: pleasant and cooperative Neuro:  . Normal language. Reasonable insight and awareness. Very alert. Sensory exam is normal. Reflexes are 1-2+ in all 4's. Fine motor coordination is intact. No tremors.  LUE 4/5 prox to distal. Grip strength improving. Better attention to the left. LLE 4/5 prox to distal. RUE and RLE 5/5.  Musculoskeletal: normal posture and ROM.    Assessment/Plan: 1.  Functional deficits secondary to right MCA infarct which require 3+ hours per day of interdisciplinary therapy in a comprehensive inpatient rehab setting.  Physiatrist is providing close team supervision and 24 hour management of active medical problems listed below.  Physiatrist and rehab team continue to assess barriers to discharge/monitor patient progress toward functional and medical goals  Care Tool:  Bathing    Body parts bathed by patient: Left arm, Chest, Abdomen, Front perineal area, Buttocks, Right upper leg, Left upper leg, Right arm, Right lower leg, Left lower leg, Face   Body parts bathed by helper: Right arm, Right lower leg, Left lower leg, Face     Bathing assist Assist Level: Independent     Upper Body Dressing/Undressing Upper body dressing   What is the patient wearing?: Pull over shirt    Upper body assist Assist Level: Independent    Lower Body Dressing/Undressing Lower body dressing      What is the patient wearing?: Underwear/pull up, Pants     Lower body assist Assist for lower body dressing: Independent     Toileting Toileting    Toileting assist Assist for toileting: Independent Assistive Device Comment: urinal   Transfers Chair/bed transfer  Transfers assist     Chair/bed transfer assist level: Independent     Locomotion Ambulation   Ambulation assist      Assist level: Independent Assistive device: No Device Max distance: >500'   Walk 10 feet activity   Assist     Assist level: Independent Assistive device: No Device   Walk 50 feet activity   Assist    Assist level: Independent  Assistive device: No Device    Walk 150 feet activity   Assist    Assist level: Independent Assistive device: No Device    Walk 10 feet on uneven surface  activity   Assist     Assist level: Independent     Wheelchair     Assist Will patient use wheelchair at discharge?: Yes             Wheelchair 50  feet with 2 turns activity    Assist            Wheelchair 150 feet activity     Assist          Blood pressure 104/65, pulse 71, temperature 98.7 F (37.1 C), resp. rate 18, height 5\' 4"  (1.626 m), weight 81.3 kg, SpO2 98 %.  Medical Problem List and Plan: 1.  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 Stroke effected right basal ganglia, insula and anterior temporal lobe           -patient may shower             -ELOS/Goals: 11/6          DC home today. F/u with me in 1-2 weeks. Please call his wife for transitional care phone call.   2.  Antithrombotics: -DVT/anticoagulation:  Pharmaceutical: Lovenox             -antiplatelet therapy: DAPT x 3 weeks--d/c ASA 11/10 followed by Plavix alone 3. Pain Management: N/A. Denies pain.  4. Mood: LCSW to follow for evaluation and support. Motivated to get better and back to his children.              -antipsychotic agents: N/A 5. Neuropsych: This patient is capable of making decisions on his own behalf. 6. Skin/Wound Care: Routine pressure relief measures.  7. Fluids/Electrolytes/Nutrition:   Encourage nectar liquids intake.  7. Transaminitis -mild elevation of LFT's from 10/28. Persistent 11/4  -pt on statin, may have to consider adjustment    -would follow up LFT's as outpt b/f making any changes 8. Witnessed Seizure: To continue Keppra till follow up with neurology. No driving till seizure free X 6 months.  9. Aspiration pneumonia: On cefdinir completed 11/1  -afebrile, wbc's down to 9.3 on 10/28, 9.2 on 11/1. Breathing well.  10.  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   -continue current diet 11.  Acute blood loss anemia: Will monitor H/H for stability and monitor for signs of bleeding.              hgb up to 12.7 10/28, up to 13.6 on 11/1. 12. Hypotension: asymptomatic. Monitor TID. Not on anti-hypertensives  13. Stroke  prevention: provided counseling.  14. Return to work: provided education and guidance   >30 minutes spent in discharge of patient including review of medications and follow-up appointments, physical examination, discussion of return to work, discussion of diet, alcohol use, return to work, family support, and in answering all patient's questions  LOS: 10 days A FACE TO FACE EVALUATION WAS PERFORMED  13/1 Glenyce Randle 07/22/2020, 9:46 AM

## 2020-07-23 NOTE — Progress Notes (Signed)
Pt discharged home with wife. Escorted to car via wheelchair with all belongings including medications. Discharge instructions provided using language line: Jamesetta Orleans [id number].

## 2020-07-23 NOTE — Progress Notes (Signed)
Some clothing found per EVS with room clean up. Message left on VM for pt wife to pick up.

## 2020-07-25 ENCOUNTER — Telehealth: Payer: Self-pay

## 2020-07-25 NOTE — Telephone Encounter (Signed)
Transitional Care Call--who you spoke with Mark Oneal (wife).  [address] Jonette Pesa ID# [id number] .   Marland Kitchen  1. Are you/is patient experiencing any problems since coming home? No problem. Are there any questions regarding any aspect of care? No. Only questions about the hand exercises. Should they be done at home? Yes to help for his mobility.  2. Are there any questions regarding medications administration/dosing? No. Med list reviewed.  Are meds being taken as prescribed? Yes.  Patient should review meds with caller to confirm. Done 3. Have there been any falls? No falls.  4. Has Home Health been to the house and/or have they contacted you? No. All phone contact numbers given.   If not, have you tried to contact them? [name] will call.  Can we help you contact them? Mark Oneal will call back if help is needed.  5. Are bowels and bladder emptying properly? Yes.  Are there any unexpected incontinence issues? No.  If applicable, is patient following bowel/bladder programs? N/A. 6. Any fevers, problems with breathing, unexpected pain? No.  7. Are there any skin problems or new areas of breakdown? None. 8. Has the patient/family member arranged specialty MD follow up (ie cardiology/neurology/renal/surgical/etc)? Phone numbers & addresses give.  Can we help arrange? [name] will call.  9. Does the patient need any other services or support that we can help arrange? No 10. Are caregivers following through as expected in assisting the patient? [name] wife is helping out.         11. Has the patient quit smoking, drinking alcohol, or using drugs as recommended? Yes.  Appointment Date/Time/ Arrival time/ and who they are seeing [address]

## 2020-07-28 ENCOUNTER — Ambulatory Visit: Payer: Self-pay | Attending: Physical Medicine and Rehabilitation | Admitting: Speech Pathology

## 2020-07-28 ENCOUNTER — Encounter: Payer: Self-pay | Admitting: Speech Pathology

## 2020-07-28 ENCOUNTER — Encounter: Payer: Self-pay | Admitting: Occupational Therapy

## 2020-07-28 ENCOUNTER — Ambulatory Visit: Payer: Self-pay | Admitting: Occupational Therapy

## 2020-07-28 ENCOUNTER — Other Ambulatory Visit: Payer: Self-pay

## 2020-07-28 DIAGNOSIS — I63411 Cerebral infarction due to embolism of right middle cerebral artery: Secondary | ICD-10-CM

## 2020-07-28 DIAGNOSIS — M6281 Muscle weakness (generalized): Secondary | ICD-10-CM

## 2020-07-28 DIAGNOSIS — I63511 Cerebral infarction due to unspecified occlusion or stenosis of right middle cerebral artery: Secondary | ICD-10-CM | POA: Insufficient documentation

## 2020-07-28 DIAGNOSIS — R41841 Cognitive communication deficit: Secondary | ICD-10-CM | POA: Insufficient documentation

## 2020-07-28 DIAGNOSIS — R278 Other lack of coordination: Secondary | ICD-10-CM | POA: Insufficient documentation

## 2020-07-28 NOTE — Therapy (Signed)
[facility] MAIN [facility] SERVICES [address] [place], [place], [address] Phone: [phone]   Fax:  [phone]  Speech Language Pathology Evaluation  Patient Details  Name: Mark Oneal MRN: [id number] Date of Birth: Jul 10, 1986 Referring Provider (SLP): Delle Reining   Encounter Date: 07/28/2020   End of Session - 07/28/20 1220    Visit Number 1    Number of Visits 1    Authorization Type pending Medicaid    SLP Start Time 0930    SLP Stop Time  1000    SLP Time Calculation (min) 30 min    Activity Tolerance Patient tolerated treatment well           Past Medical History:  Diagnosis Date  . 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 BUBBLE STUDY  07/06/2020   Procedure: BUBBLE STUDY;  Surgeon: Little Ishikawa, MD;  Location: [facility] ENDOSCOPY;  Service: Cardiovascular;;  . IR ANGIO INTRA EXTRACRAN SEL COM CAROTID INNOMINATE UNI L MOD SED  07/03/2020  . IR CT HEAD LTD  07/03/2020  . IR CT HEAD LTD  07/03/2020  . IR PERCUTANEOUS ART THROMBECTOMY/INFUSION INTRACRANIAL INC DIAG ANGIO  07/03/2020  . IR US GUIDE VASC ACCESS RIGHT  07/03/2020  . RADIOLOGY WITH ANESTHESIA N/A 07/03/2020   Procedure: IR WITH ANESTHESIA;  Surgeon: Radiologist, Medication, MD;  Location: [facility] OR;  Service: Radiology;  Laterality: N/A;  . TEE WITHOUT CARDIOVERSION N/A 07/06/2020   Procedure: TRANSESOPHAGEAL ECHOCARDIOGRAM (TEE);  Surgeon: Little Ishikawa, MD;  Location: [facility] ENDOSCOPY;  Service: Cardiovascular;  Laterality: N/A;    There were no vitals filed for this visit.   Subjective Assessment - 07/28/20 0938    Subjective Pt pleasant, accompanied by his wife    Patient is accompained by: Family member    Currently in Pain? No/denies              SLP Evaluation [facility] - 07/28/20 0923      SLP Visit Information   SLP Received On 07/28/20    Referring Provider (SLP) Delle Reining     Onset Date 06/2020    Medical Diagnosis CVA      Subjective   Subjective pt pleasant, accompanied by his wife and interpreter      General Information   Behavioral/Cognition good historian    Mobility Status ambulatory      Balance Screen   Has the patient fallen in the past 6 months No    Has the patient had a decrease in activity level because of a fear of falling?  No    Is the patient reluctant to leave their home because of a fear of falling?  No      Prior Functional Status   Cognitive/Linguistic Baseline Within functional limits     Lives With Spouse;Family    Available Support Family    Education high school in [employer]    Vocation Full time employment      Pain Assessment   Pain Assessment No/denies pain      Cognition   Overall Cognitive Status Within Functional Limits for tasks assessed      Auditory Comprehension   Overall Auditory Comprehension Appears within functional limits for tasks assessed      Visual Recognition/Discrimination   Discrimination Within Function Limits      Reading Comprehension   Reading Status Within funtional limits      Expression   Primary  Mode of Expression Verbal      Verbal Expression   Overall Verbal Expression Appears within functional limits for tasks assessed      Written Expression   Dominant Hand Right    Written Expression Within Functional Limits      Oral Motor/Sensory Function   Overall Oral Motor/Sensory Function Appears within functional limits for tasks assessed      Motor Speech   Overall Motor Speech Appears within functional limits for tasks assessed      Standardized Assessments   Standardized Assessments  Cognitive Linguistic Quick Test      Cognitive Linguistic Quick Test (Ages 18-69)   Attention WNL    Memory WNL    Executive Function WNL    Language WNL    Visuospatial Skills WNL    Severity Rating Total 20    Composite Severity Rating 16.8      Assessment   SLP  Recommendation/Assessment Patient does not need any further Speech Lanaguage Pathology Services    SLP Visit Diagnosis Cognitive communication deficit (R41.841)    No Skilled Speech Therapy Patient at baseline level of functioning;All education completed;Patient is independent with all cognitive/linguistic skills      SLP Recommendations   Follow up Recommendations None      Individuals Consulted   Consulted and Agree with Results and Recommendations Patient;Family member/caregiver    Family Member Consulted  pt's wife             SLP Education - 07/28/20 1219    Education Details cognitive linguistic, dysphagia,    Person(s) Educated Patient;Spouse    Methods Explanation;Demonstration;Verbal cues    Comprehension Verbalized understanding;Returned demonstration                Plan - 07/28/20 1220    Clinical Impression Statement Pt presents with cognitive linguistic skills that are concerned to be within normal limits. This further supported by his scores on the Cognitive Linguistic Quick Test. He demonstrated ability that was solidly within the normal limits. Additional, he and his wife do not report any concerns regarding safety awareness. As such, skilled ST intervention is not indicated.    Consulted and Agree with Plan of Care Patient;Family member/caregiver    Family Member Consulted wife           Problem List Patient Active Problem List   Diagnosis Date Noted  . Hemi-inattention--left 07/21/2020  . Acute ischemic right MCA stroke (HCC) 07/12/2020  . Dysphagia, post-stroke   . Aspiration pneumonia (HCC)   . Seizures (HCC) 07/07/2020  . Hyperlipidemia LDL goal <70 07/07/2020  . 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 Obesity 07/07/2020  . Leukocytosis 07/07/2020  . History of CVA with residual deficit   . Cerebral infarction due to embolism of R ICA, R MCA vs dissection (HCC) s/p IR w/ revascularization 07/03/2020  . Middle cerebral artery  embolism, 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  . Encephalopathy acute    Nichol Ator B. Dreama Saa M.S., CCC-SLP, [facility] Speech-Language Pathologist Rehabilitation Services Office [phone]  Reuel Derby 07/28/2020, 12:23 PM  [place] [facility] MAIN [facility] SERVICES [address] [place], [place], [address] Phone: [phone]   Fax:  [phone]  Name: Mark Oneal MRN: [id number] Date of Birth: 05/13/1986

## 2020-08-01 ENCOUNTER — Encounter: Payer: Self-pay | Admitting: Speech Pathology

## 2020-08-01 ENCOUNTER — Ambulatory Visit: Payer: Self-pay | Admitting: Occupational Therapy

## 2020-08-01 ENCOUNTER — Other Ambulatory Visit: Payer: Self-pay

## 2020-08-01 DIAGNOSIS — M6281 Muscle weakness (generalized): Secondary | ICD-10-CM

## 2020-08-01 DIAGNOSIS — R278 Other lack of coordination: Secondary | ICD-10-CM

## 2020-08-01 NOTE — Therapy (Signed)
[facility] MAIN [facility] SERVICES [address] [place], [place], [address] Phone: [phone]   Fax:  [phone]  Occupational Therapy Evaluation  Patient Details  Name: Mark Oneal MRN: [id number] Date of Birth: 1986-07-31 Referring Provider (OT): Delle Reining   Encounter Date: 07/28/2020   OT End of Session - 08/01/20 1042    Visit Number 1    Number of Visits 24    Date for OT Re-Evaluation 10/20/20    Authorization Type medicaid pending, self pay    OT Start Time 1000    OT Stop Time 1100    OT Time Calculation (min) 60 min    Activity Tolerance Patient tolerated treatment well    Behavior During Therapy [facility] for tasks assessed/performed           Past Medical History:  Diagnosis Date  . 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 BUBBLE STUDY  07/06/2020   Procedure: BUBBLE STUDY;  Surgeon: Little Ishikawa, MD;  Location: [facility] ENDOSCOPY;  Service: Cardiovascular;;  . IR ANGIO INTRA EXTRACRAN SEL COM CAROTID INNOMINATE UNI L MOD SED  07/03/2020  . IR CT HEAD LTD  07/03/2020  . IR CT HEAD LTD  07/03/2020  . IR PERCUTANEOUS ART THROMBECTOMY/INFUSION INTRACRANIAL INC DIAG ANGIO  07/03/2020  . IR US GUIDE VASC ACCESS RIGHT  07/03/2020  . RADIOLOGY WITH ANESTHESIA N/A 07/03/2020   Procedure: IR WITH ANESTHESIA;  Surgeon: Radiologist, Medication, MD;  [facility]: [facility] OR;  Service: Radiology;  Laterality: N/A;  . TEE WITHOUT CARDIOVERSION N/A 07/06/2020   Procedure: TRANSESOPHAGEAL ECHOCARDIOGRAM (TEE);  Surgeon: Little Ishikawa, MD;  Location: [facility] ENDOSCOPY;  Service: Cardiovascular;  Laterality: N/A;    There were no vitals filed for this visit.   Subjective Assessment - 07/31/20 1041    Subjective  CVA October 18, reports he was seeing lights in his head, otherwise he didn't feel anything odd, he was taken to the hospital via EMS and admitted.  Pt reports he  needs to be able to use his left hand more, it was worse but it has advanced alot.    Pertinent History [name] is a 34 y.o. male with history of prior R-SCA infarct who was admitted on 07/03/2020 after a fall with resultant dysarthria and jerking of BUE/BLE.  On evaluation by EMS he was found to have right gaze deviation and left hemiparesis.  He was loaded with Keppra with recommendations to continue this due to seizure at stroke onset and in CT.   CT head showed hyperdense right MCA sign and CTA head/neck showed occlusion of right ICA 2 cm from origin with reconstitution by PCA and subsequent occlusion of proximal right M1 MCA.  He underwent cerebral angiogram with complete revascularization of occluded right ICA and ICA terminus, right MCA and right ACA with question of proximal right ICA dissection.  Hospitalization from 07/03/2020 to 07/22/2020, included inpatient rehab.    Patient Stated Goals Patient reports he would like to be as independent as possible at home and at work.    Currently in Pain? No/denies    Pain Score 0-No pain             [facility] OT Assessment - 08/01/20 1047      Assessment   Medical Diagnosis CVA    Referring Provider (OT) Delle Reining    Hand Dominance Right    Prior Therapy yes  Precautions   Precaution Comments seizures      Balance Screen   Has the patient fallen in the past 6 months No    Has the patient had a decrease in activity level because of a fear of falling?  No    Is the patient reluctant to leave their home because of a fear of falling?  No      Home  Environment   Family/patient expects to be discharged to: Private residence    Living Arrangements Spouse/significant other   stepson, children and brother in law   Available Help at Discharge Family    Type of Home House    Home Access Stairs    Home Layout One level    Alternate Level Stairs - Number of Steps no steps to enter    Bathroom Shower/Tub Tub/Shower unit;Curtain     Information systems manager    Lives With Spouse      Prior Function   Level of Independence Independent    Vocation Full time employment    Higher education careers adviser work    Leisure go to the park with kids       ADL   Eating/Feeding Modified independent    Grooming Modified independent    Upper Body Bathing Modified independent    Lower Body Bathing Increased time    Upper Body Dressing Independent;Increased time    Lower Body Dressing Increased time    Toilet Transfer Modified independent    Toileting - Clothing Manipulation Modified independent;Increased time    Tub/Shower Transfer Supervision/safety    ADL comments increased time required to complete basic self care tasks, increased focus on left hand for specific movements/patterns, some difficulty with opening jars and containers, has not returned to driving, unable to use tools for work such as a shovel.  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        IADL   Prior Level of Function Shopping independent    Shopping Needs to be accompanied on any shopping trip    Prior Level of Function Light Housekeeping independent    Light Housekeeping Performs light daily tasks such as dishwashing, bed making    Prior Level of Function Meal Prep independent    Meal Prep Able to complete simple cold meal and snack prep    Prior Level of Function Community Mobility independent    Community Mobility Relies on family or friends for transportation    Prior Level of Function Medication Managment independent    Medication Management Takes responsibility if medication is prepared in advance in seperate dosage    Prior Level of Function Financial Management independent    Development worker, community financial matters independently (budgets, writes checks, pays rent, bills goes to bank), collects and keeps track of income      Mobility   Mobility Status Independent        Written Expression   Dominant Hand Right      Vision - History   Baseline Vision No visual deficits      Vision Assessment   Comment Pt denies any visual changes but does exhibit signs of left hemi inattention.        Cognition   Overall Cognitive Status Within Functional Limits for tasks assessed    Cognition Comments No changes in cognition per patient or wife. See SLP eval for details.       Observation/Other Assessments   Focus on Therapeutic  Outcomes (FOTO)  61      Sensation   Light Touch Appears Intact    Hot/Cold Appears Intact      Coordination   Gross Motor Movements are Fluid and Coordinated No    Fine Motor Movements are Fluid and Coordinated No    Finger Nose Finger Test impaired on left    9 Hole Peg Test Right;Left    Right 9 Hole Peg Test 25    Left 9 Hole Peg Test 1 min 9 sec      AROM   Overall AROM  Within functional limits for tasks performed    Overall AROM Comments full fisting on left and opposition intact      Strength   Overall Strength Deficits    Overall Strength Comments left UE 4-/5 overall, right 5/5       Hand Function   Right Hand Grip (lbs) 95    Right Hand Lateral Pinch 20 lbs    Right Hand 3 Point Pinch 14 lbs    Left Hand Grip (lbs) 35    Left Hand Lateral Pinch 16 lbs    Left 3 point pinch 11 lbs                           OT Education - 07/31/20 1041    Education Details POC, role of OT, goals    Person(s) Educated Patient;Spouse    Methods Explanation    Comprehension Verbalized understanding               OT Long Term Goals - 08/01/20 1204      OT LONG TERM GOAL #1   Title Patient will demonstrate home exercise program with modified independence.    Baseline limited HEP at eval    Time 12    Period Weeks    Status New    Target Date 10/20/20      OT LONG TERM GOAL #2   Title Pt will improve left hand grip strength by 20# to be able to stablize and hold objects without dropping.     Baseline left grip 35# at eval    Time 12    Period Weeks    Status New    Target Date 10/20/20      OT LONG TERM GOAL #3   Title Pt will improve coordination speed with 9 hole peg test in less than 40 secs on left to be able to manipulate small objects.    Baseline left 9 hole peg test 1 min 9 sec.    Time 12    Period Weeks    Status New    Target Date 10/20/20      OT LONG TERM GOAL #4   Title Pt will improve strength in left UE by 1 mm grade to assist with lifting objects to place onto overhead shelf.    Baseline strength 4-/5 LUE    Time 6    Period Weeks    Status New    Target Date 09/08/20      OT LONG TERM GOAL #5   Title Pt will improve FOTO score to 74 or greater to demonstrate clinically relevant change to complete ADL and IADL tasks at home with modified independence.    Baseline score at eval 61    Time 12    Period Weeks    Status New    Target Date 10/20/20      Long  Term Additional Goals   Additional Long Term Goals Yes      OT LONG TERM GOAL #6   Title Pt will demonstrate improvements in left UE strength to use tools such as a shovel for potential return to work tasks.    Baseline unable at eval    Time 12    Period Weeks    Status New    Target Date 10/20/20                 Plan - 08/01/20 1043    Clinical Impression Statement Patient is a 34 yo male who was admitted to the hospital on 07/03/2020 with a acute ischemic right MCA CVA.  His hospital stay was from 07/03/2020 to 07/22/2020 which also included inpatient rehabilitation.  He was discharged home with his wife, has 2 small children and works in Holiday representativeconstruction.  Patient was evaluated by OT this date and presents with left inattention, decreased strength, impaired coordination, decreased ability to perform ADL tasks to the level he did prior and is now unable to work.  He would benefit from skilled OT services to maximize his safety and independence for necessary daily tasks at home, at work  and in the community.  Patient's goal is to regain his left hand strength and "be back to normal" again.    OT Occupational Profile and History Detailed Assessment- Review of Records and additional review of physical, cognitive, psychosocial history related to current functional performance    Occupational performance deficits (Please refer to evaluation for details): ADL's;IADL's;Social Participation;Work    Games developerBody Structure / Function / Physical Skills ADL;Dexterity;ROM;Strength;Coordination;FMC;IADL;UE functional use;Decreased knowledge of use of DME    Psychosocial Skills Environmental  Adaptations;Habits;Routines and Behaviors    Rehab Potential Excellent    Clinical Decision Making Limited treatment options, no task modification necessary    Comorbidities Affecting Occupational Performance: May have comorbidities impacting occupational performance    Modification or Assistance to Complete Evaluation  No modification of tasks or assist necessary to complete eval    OT Frequency 2x / week    OT Duration 12 weeks    OT Treatment/Interventions Self-care/ADL training;Cryotherapy;Therapeutic exercise;DME and/or AE instruction;Neuromuscular education;Manual Therapy;Moist Heat;Contrast Bath;Therapeutic activities;Patient/family education    Consulted and Agree with Plan of Care Patient;Family member/caregiver    Family Member Consulted wife           Patient will benefit from skilled therapeutic intervention in order to improve the following deficits and impairments:   Body Structure / Function / Physical Skills: ADL, Dexterity, ROM, Strength, Coordination, FMC, IADL, UE functional use, Decreased knowledge of use of DME   Psychosocial Skills: Environmental  Adaptations, Habits, Routines and Behaviors   Visit Diagnosis: Muscle weakness (generalized)  Other lack of coordination  Acute ischemic right MCA stroke (HCC)  Cerebral infarction due to embolism of R ICA, R MCA vs dissection (HCC)  s/p IR w/ revascularization    Problem List Patient Active Problem List   Diagnosis Date Noted  . Hemi-inattention--left 07/21/2020  . Acute ischemic right MCA stroke (HCC) 07/12/2020  . Dysphagia, post-stroke   . Aspiration pneumonia (HCC)   . Seizures (HCC) 07/07/2020  . Hyperlipidemia LDL goal <70 07/07/2020  . 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 Obesity 07/07/2020  . Leukocytosis 07/07/2020  . History of CVA with residual deficit   . Cerebral infarction due to embolism of R ICA, R MCA vs dissection (HCC) s/p IR w/ revascularization 07/03/2020  . 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  Encephalopathy acute    Captain Blucher Cornelius Moras, OTR/L, CLT  Marrion Accomando 08/01/2020, 12:19 PM  [facility] MAIN [facility] SERVICES [address] [place], [place], [address] Phone: [phone]   Fax:  [phone]  Name: Mark Oneal MRN: [id number] Date of Birth: 1986-08-14

## 2020-08-02 ENCOUNTER — Other Ambulatory Visit: Payer: Self-pay

## 2020-08-02 ENCOUNTER — Encounter: Payer: Self-pay | Admitting: Physical Medicine & Rehabilitation

## 2020-08-02 ENCOUNTER — Encounter: Payer: Self-pay | Admitting: Occupational Therapy

## 2020-08-02 ENCOUNTER — Encounter: Payer: Self-pay | Attending: Physical Medicine & Rehabilitation | Admitting: Physical Medicine & Rehabilitation

## 2020-08-02 VITALS — BP 106/73 | HR 93 | Temp 98.2°F | Ht 64.0 in | Wt 179.2 lb

## 2020-08-02 DIAGNOSIS — R7989 Other specified abnormal findings of blood chemistry: Secondary | ICD-10-CM | POA: Insufficient documentation

## 2020-08-02 DIAGNOSIS — R569 Unspecified convulsions: Secondary | ICD-10-CM | POA: Insufficient documentation

## 2020-08-02 DIAGNOSIS — I63411 Cerebral infarction due to embolism of right middle cerebral artery: Secondary | ICD-10-CM | POA: Insufficient documentation

## 2020-08-02 MED ORDER — PANTOPRAZOLE SODIUM 40 MG PO TBEC
40.0000 mg | DELAYED_RELEASE_TABLET | Freq: Every day | ORAL | 3 refills | Status: DC
Start: 2020-08-02 — End: 2020-08-02

## 2020-08-02 MED ORDER — ATORVASTATIN CALCIUM 80 MG PO TABS
80.0000 mg | ORAL_TABLET | Freq: Every day | ORAL | 3 refills | Status: DC
Start: 2020-08-02 — End: 2020-08-02

## 2020-08-02 MED ORDER — PANTOPRAZOLE SODIUM 40 MG PO TBEC: 40.0000 mg | DELAYED_RELEASE_TABLET | Freq: Every day | ORAL | 3 refills | Status: DC

## 2020-08-02 MED ORDER — CLOPIDOGREL BISULFATE 75 MG PO TABS
75.0000 mg | ORAL_TABLET | Freq: Every day | ORAL | 3 refills | Status: DC
Start: 2020-08-02 — End: 2020-08-02

## 2020-08-02 MED ORDER — CLOPIDOGREL BISULFATE 75 MG PO TABS: 75.0000 mg | ORAL_TABLET | Freq: Every day | ORAL | 3 refills | Status: DC

## 2020-08-02 MED ORDER — LEVETIRACETAM 500 MG PO TABS: 500.0000 mg | ORAL_TABLET | Freq: Two times a day (BID) | ORAL | 3 refills | Status: DC

## 2020-08-02 MED ORDER — LEVETIRACETAM 500 MG PO TABS
500.0000 mg | ORAL_TABLET | Freq: Two times a day (BID) | ORAL | 3 refills | Status: DC
Start: 2020-08-02 — End: 2020-08-02

## 2020-08-02 MED ORDER — ATORVASTATIN CALCIUM 80 MG PO TABS: 80.0000 mg | ORAL_TABLET | Freq: Every day | ORAL | 3 refills | Status: DC

## 2020-08-02 NOTE — Therapy (Signed)
[facility] MAIN [facility] SERVICES [address] [place], [place], [address] Phone: [phone]   Fax:  [phone]  Occupational Therapy Treatment  Patient Details  Name: Mark Oneal MRN: [id number] Date of Birth: 1986/04/30 Referring Provider (OT): Delle Reining   Encounter Date: 08/01/2020   OT End of Session - 08/02/20 0851    Visit Number 2    Number of Visits 24    Date for OT Re-Evaluation 10/20/20    Authorization Type medicaid pending, self pay    OT Start Time 1145    OT Stop Time 1230    OT Time Calculation (min) 45 min    Activity Tolerance Patient tolerated treatment well    Behavior During Therapy [facility] for tasks assessed/performed           Past Medical History:  Diagnosis Date  . 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 BUBBLE STUDY  07/06/2020   Procedure: BUBBLE STUDY;  Surgeon: Little Ishikawa, MD;  Location: [facility] ENDOSCOPY;  Service: Cardiovascular;;  . IR ANGIO INTRA EXTRACRAN SEL COM CAROTID INNOMINATE UNI L MOD SED  07/03/2020  . IR CT HEAD LTD  07/03/2020  . IR CT HEAD LTD  07/03/2020  . IR PERCUTANEOUS ART THROMBECTOMY/INFUSION INTRACRANIAL INC DIAG ANGIO  07/03/2020  . IR US GUIDE VASC ACCESS RIGHT  07/03/2020  . RADIOLOGY WITH ANESTHESIA N/A 07/03/2020   Procedure: IR WITH ANESTHESIA;  Surgeon: Radiologist, Medication, MD;  [facility]: [facility] OR;  Service: Radiology;  Laterality: N/A;  . TEE WITHOUT CARDIOVERSION N/A 07/06/2020   Procedure: TRANSESOPHAGEAL ECHOCARDIOGRAM (TEE);  Surgeon: Little Ishikawa, MD;  Location: [facility] ENDOSCOPY;  Service: Cardiovascular;  Laterality: N/A;    There were no vitals filed for this visit.   Subjective Assessment - 08/02/20 0849    Subjective  [name] provided spanish speaking Intepreter services throughout the session    Pertinent History Mark Oneal is a 34 y.o. male with history of prior R-SCA infarct who  was admitted on 07/03/2020 after a fall with resultant dysarthria and jerking of BUE/BLE.  On evaluation by EMS he was found to have right gaze deviation and left hemiparesis.  He was loaded with Keppra with recommendations to continue this due to seizure at stroke onset and in CT.   CT head showed hyperdense right MCA sign and CTA head/neck showed occlusion of right ICA 2 cm from origin with reconstitution by PCA and subsequent occlusion of proximal right M1 MCA.  He underwent cerebral angiogram with complete revascularization of occluded right ICA and ICA terminus, right MCA and right ACA with question of proximal right ICA dissection.  Hospitalization from 07/03/2020 to 07/22/2020, included inpatient rehab.    Patient Stated Goals Patient reports he would like to be as independent as possible at home and at work.    Currently in Pain? No/denies           OT TREATMENT    Neuro muscular re-education:  Pt. performed [facility] tasks using the Grooved pegboard. Pt. worked on grasping the grooved pegs from a horizontal position, and moving the pegs to a vertical position in the hand to prepare for placing them in the grooved slot. Pt. Worked on translatory skills storing the pegs in the palm of his hand, and moving the pegs from his palm to the tip of his 2nd digit, and thumb.  Therapeutic Exercise:  Pt. performed Left UE gross gripping with grip  strengthener. Pt. worked on sustaining grip while grasping pegs and reaching at various heights. The gripper was set at 23.4#. Pt. Worked on gross gripping using the camry digital dynamometer for 31.^#, 31.0#, 30.6#, 30.Marland Kitchen4#. Pt. Worked on pinch strengthening in the left hand for lateral, and 3pt. pinch using yellow, red, green, blue, and black resistive clips. Pt. worked on placing the clips onto horizontal dowela placed at tabletop height.   Pt. requires verbal cues, and cues for visual demonstration for movement patterns, form, and technique when performing  translatory movements of the hand. Pt. Required cues to move the pegs to the tip of his 2nd digit, and thumb. Multiple pegs were dropped during the [facility] task. Pt. Continues to work on improving LUE strength, and [facility] skills in order to improve engagement in, and perform daily ADLs, and IADL tasks.                     OT Education - 08/02/20 0851    Education Details POC, role of OT, goals    Person(s) Educated Patient;Spouse    Methods Explanation    Comprehension Verbalized understanding               OT Long Term Goals - 08/01/20 1204      OT LONG TERM GOAL #1   Title Patient will demonstrate home exercise program with modified independence.    Baseline limited HEP at eval    Time 12    Period Weeks    Status New    Target Date 10/20/20      OT LONG TERM GOAL #2   Title Pt will improve left hand grip strength by 20# to be able to stablize and hold objects without dropping.    Baseline left grip 35# at eval    Time 12    Period Weeks    Status New    Target Date 10/20/20      OT LONG TERM GOAL #3   Title Pt will improve coordination speed with 9 hole peg test in less than 40 secs on left to be able to manipulate small objects.    Baseline left 9 hole peg test 1 min 9 sec.    Time 12    Period Weeks    Status New    Target Date 10/20/20      OT LONG TERM GOAL #4   Title Pt will improve strength in left UE by 1 mm grade to assist with lifting objects to place onto overhead shelf.    Baseline strength 4-/5 LUE    Time 6    Period Weeks    Status New    Target Date 09/08/20      OT LONG TERM GOAL #5   Title Pt will improve FOTO score to 74 or greater to demonstrate clinically relevant change to complete ADL and IADL tasks at home with modified independence.    Baseline score at eval 61    Time 12    Period Weeks    Status New    Target Date 10/20/20      Long Term Additional Goals   Additional Long Term Goals Yes      OT LONG TERM GOAL #6    Title Pt will demonstrate improvements in left UE strength to use tools such as a shovel for potential return to work tasks.    Baseline unable at eval    Time 12    Period Weeks  Status New    Target Date 10/20/20                 Plan - 08/02/20 0851    Clinical Impression Statement Pt. requires verbal cues, and cues for visual demonstration for movement patterns, form, and technique when performing translatory movements of the hand. Pt. Required cues to move the pegs to the tip of his 2nd digit, and thumb. Multiple pegs were dropped during the [facility] task. Pt. Continues to work on improving LUE strength, and [facility] skills in order to improve engagement in, and perform daily ADLs, and IADL tasks.   OT Occupational Profile and History Detailed Assessment- Review of Records and additional review of physical, cognitive, psychosocial history related to current functional performance    Occupational performance deficits (Please refer to evaluation for details): ADL's;IADL's;Social Participation;Work    Games developer / Function / Physical Skills ADL;Dexterity;ROM;Strength;Coordination;FMC;IADL;UE functional use;Decreased knowledge of use of DME    Psychosocial Skills Environmental  Adaptations;Habits;Routines and Behaviors    Rehab Potential Excellent    Clinical Decision Making Limited treatment options, no task modification necessary    Comorbidities Affecting Occupational Performance: May have comorbidities impacting occupational performance    Modification or Assistance to Complete Evaluation  No modification of tasks or assist necessary to complete eval    OT Frequency 2x / week    OT Duration 12 weeks    OT Treatment/Interventions Self-care/ADL training;Cryotherapy;Therapeutic exercise;DME and/or AE instruction;Neuromuscular education;Manual Therapy;Moist Heat;Contrast Bath;Therapeutic activities;Patient/family education    Consulted and Agree with Plan of Care Patient;Family  member/caregiver    Family Member Consulted wife           Patient will benefit from skilled therapeutic intervention in order to improve the following deficits and impairments:   Body Structure / Function / Physical Skills: ADL, Dexterity, ROM, Strength, Coordination, FMC, IADL, UE functional use, Decreased knowledge of use of DME   Psychosocial Skills: Environmental  Adaptations, Habits, Routines and Behaviors   Visit Diagnosis: Muscle weakness (generalized)  Other lack of coordination    Problem List Patient Active Problem List   Diagnosis Date Noted  . Hemi-inattention--left 07/21/2020  . Acute ischemic right MCA stroke (HCC) 07/12/2020  . Dysphagia, post-stroke   . Aspiration pneumonia (HCC)   . Seizures (HCC) 07/07/2020  . Hyperlipidemia LDL goal <70 07/07/2020  . 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 Obesity 07/07/2020  . Leukocytosis 07/07/2020  . History of CVA with residual deficit   . Cerebral infarction due to embolism of R ICA, R MCA vs dissection (HCC) s/p IR w/ revascularization 07/03/2020  . 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 Encephalopathy acute     Olegario Messier, MS, OTR/L 08/02/2020, 8:52 AM  [facility] MAIN [facility] SERVICES [address] [place], [place], [address] Phone: [phone]   Fax:  [phone]  Name: Danuel Felicetti MRN: [id number] Date of Birth: 03-04-1986

## 2020-08-02 NOTE — Progress Notes (Signed)
Subjective:    Patient ID: Mark Oneal, male    DOB: [birth year], 34 y.o.   MRN: [id number]  HPI   Mark Oneal is here for a transitional care visit in follow up of his right MCA infarct. He says he's doing very well. He reports his only problem is some residual weakness in his left arm.  He denies sensory loss in the arm.  He feels that cognitively he is back to baseline.  He's walking 20" daily. He's on regular diet. He's sleeping well. His mood is positive. Bowels and bladder are working regular. He denies pain of any kind   He worked as a Actor prior to being hospitalized.  He realizes that he cannot go back to such heavy work.  He has not driven.  Denies any seizures.  Emotionally he is doing well.  He gets along with his family.  Family is very supportive.   Pain Inventory Average Pain 0 Pain Right Now 0 My pain is no pain  LOCATION OF PAIN  BOWEL Number of stools per week: normal Oral laxative use Yes  Type of laxative colace Enema or suppository use No  History of colostomy No  Incontinent No   BLADDER Normal In and out cath, frequency  Able to self cath No  Bladder incontinence No  Frequent urination No  Leakage with coughing No  Difficulty starting stream No  Incomplete bladder emptying No    Mobility walk without assistance  Function Do you have any goals in this area?  no  Neuro/Psych No problems in this area  Prior Studies Any changes since last visit?  no  Physicians involved in your care Any changes since last visit?  no   Family History  Problem Relation Age of Onset  . Hypertension Mother   . Hypertension Father    Social History   Socioeconomic History  . Marital status: Single    Spouse name: [name]  . Number of children: 2  . Years of education: Not on file  . Highest education level: Not on file  Occupational History  . Not on file  Tobacco Use  . Smoking status: Former Smoker    Types: Cigarettes  . Smokeless tobacco: Never  Used  . Tobacco comment: quit after stroke in 2020  Substance and Sexual Activity  . Alcohol use: Yes    Alcohol/week: 3.0 standard drinks    Types: 3 Cans of beer per week    Comment: 20 ounce cans  . Drug use: Not on file  . Sexual activity: Not on file  Other Topics Concern  . Not on file  Social History Narrative   Lives with his partner of 8 years and has 2 kids with her. All of his family is in a different country.   Social Determinants of Health   Financial Resource Strain:   . Difficulty of Paying Living Expenses: Not on file  Food Insecurity:   . Worried About Programme researcher, broadcasting/film/video in the Last Year: Not on file  . Ran Out of Food in the Last Year: Not on file  Transportation Needs:   . Lack of Transportation (Medical): Not on file  . Lack of Transportation (Non-Medical): Not on file  Physical Activity:   . Days of Exercise per Week: Not on file  . Minutes of Exercise per Session: Not on file  Stress:   . Feeling of Stress : Not on file  Social Connections:   . Frequency of Communication with Friends  and Family: Not on file  . Frequency of Social Gatherings with Friends and Family: Not on file  . Attends Religious Services: Not on file  . Active Member of Clubs or Organizations: Not on file  . Attends Banker Meetings: Not on file  . Marital Status: Not on file   Past Surgical History:  Procedure Laterality Date  . BUBBLE STUDY  07/06/2020   Procedure: BUBBLE STUDY;  Surgeon: Little Ishikawa, MD;  Location: [facility] ENDOSCOPY;  Service: Cardiovascular;;  . IR ANGIO INTRA EXTRACRAN SEL COM CAROTID INNOMINATE UNI L MOD SED  07/03/2020  . IR CT HEAD LTD  07/03/2020  . IR CT HEAD LTD  07/03/2020  . IR PERCUTANEOUS ART THROMBECTOMY/INFUSION INTRACRANIAL INC DIAG ANGIO  07/03/2020  . IR US GUIDE VASC ACCESS RIGHT  07/03/2020  . RADIOLOGY WITH ANESTHESIA N/A 07/03/2020   Procedure: IR WITH ANESTHESIA;  Surgeon: Radiologist, Medication, MD;  Location: [facility]  OR;  Service: Radiology;  Laterality: N/A;  . TEE WITHOUT CARDIOVERSION N/A 07/06/2020   Procedure: TRANSESOPHAGEAL ECHOCARDIOGRAM (TEE);  Surgeon: Little Ishikawa, MD;  Location: [facility] ENDOSCOPY;  Service: Cardiovascular;  Laterality: N/A;   Past Medical History:  Diagnosis Date  . History of stroke involving cerebellum 05/2019   BP 106/73   Pulse 93   Temp 98.2 F (36.8 C)   Ht 5\' 4"  (1.626 m)   Wt 179 lb 3.2 oz (81.3 kg) Comment: last recorded  SpO2 98%   BMI 30.76 kg/m   Opioid Risk Score:   Fall Risk Score:  `1  Depression screen PHQ 2/9  Depression screen PHQ 2/9 08/02/2020  Decreased Interest 2  Down, Depressed, Hopeless 2  PHQ - 2 Score 4  Altered sleeping 0  Tired, decreased energy 2  Change in appetite 0  Feeling bad or failure about yourself  2  Trouble concentrating 0  Moving slowly or fidgety/restless 2  Suicidal thoughts 0  PHQ-9 Score 10     Review of Systems  Constitutional: Negative.   HENT: Negative.   Eyes: Negative.   Respiratory: Negative.   Cardiovascular: Negative.   Gastrointestinal: Negative.   Endocrine: Negative.   Genitourinary: Negative.   Musculoskeletal: Negative.   Skin: Negative.   Allergic/Immunologic: Negative.   Neurological: Negative.   Hematological: Bruises/bleeds easily.       Plavix  Psychiatric/Behavioral: Negative.   All other systems reviewed and are negative.      Objective:   Physical Exam  Gen: no distress, normal appearing HEENT: oral mucosa pink and moist, NCAT Cardio: Reg rate Chest: normal effort, normal rate of breathing Abd: soft, non-distended Ext: no edema Skin: intact Neuro: Patient is alert and oriented x3.  No focal cranial nerve abnormalities.  He tracks all fields.  I saw no visual spatial deficits.  Right upper extremity is 5 out of 5.  Left upper extremity is notable for 4 out of 5 triceps and biceps 4+ deltoids 4+ grip in wrist flexors extensors.  Right lower extremities 5 out of 5.   Left lower extremity is 4+ to 5 out of 5.  No sensory abnormalities are seen.  Patient is able to walk in tandem gait as well as heel-to-toe gait without loss of balance. Musculoskeletal: Normal range of motion.  No joint or axial pain. Psych: pleasant, normal affect       Assessment & Plan:  1.  Left hemiparesis and functional deficits secondary to right MCA infarct  -Continue with home exercise program  -Discussed  ongoing exercise and conditioning.  -He should remain out of work for the next 3 months.  He certainly could work on work conditioning over that period of time.  He also will be looking for work of a lighter duty. 2.  Transaminitis:  -Concerned that this is statin related  -Like to follow-up LFTs--these were ordered today 3.  Seizures: Keppra  -No driving for 6 months  -We will speak with neurology regarding further seizure management  -Referral was [facility] to [facility] 4.  Dysphagia  -Resolved  Thirty minutes of face to face patient care time were spent during this visit. All questions were encouraged and answered. Follow up with me in 2 mos.

## 2020-08-02 NOTE — Patient Instructions (Addendum)
PLEASE FEEL FREE TO CALL OUR OFFICE WITH ANY PROBLEMS OR QUESTIONS [phone])   STILL NO WORK FOR 3 MONTHS  WHEN YOU GO BACK TO WORK, IT WILL NEED TO BE LIGHTER DUTY

## 2020-08-02 NOTE — Progress Notes (Signed)
106/73 

## 2020-08-03 ENCOUNTER — Encounter: Payer: Self-pay | Admitting: Speech Pathology

## 2020-08-03 LAB — HEPATIC FUNCTION PANEL
ALT: 40 IU/L (ref 0–44)
AST: 26 IU/L (ref 0–40)
Albumin: 4.8 g/dL (ref 4.0–5.0)
Alkaline Phosphatase: 124 IU/L — ABNORMAL HIGH (ref 44–121)
Bilirubin Total: 0.3 mg/dL (ref 0.0–1.2)
Bilirubin, Direct: <0.1 mg/dL (ref 0.00–0.40)
Total Protein: 7.5 g/dL (ref 6.0–8.5)

## 2020-08-08 ENCOUNTER — Encounter: Payer: Self-pay | Admitting: Speech Pathology

## 2020-08-09 ENCOUNTER — Ambulatory Visit: Payer: Self-pay | Admitting: Occupational Therapy

## 2020-08-16 ENCOUNTER — Ambulatory Visit: Payer: Self-pay | Attending: Physical Medicine and Rehabilitation | Admitting: Occupational Therapy

## 2020-08-16 ENCOUNTER — Other Ambulatory Visit: Payer: Self-pay

## 2020-08-16 ENCOUNTER — Encounter: Payer: Self-pay | Admitting: Speech Pathology

## 2020-08-16 DIAGNOSIS — I63511 Cerebral infarction due to unspecified occlusion or stenosis of right middle cerebral artery: Secondary | ICD-10-CM | POA: Insufficient documentation

## 2020-08-16 DIAGNOSIS — R278 Other lack of coordination: Secondary | ICD-10-CM | POA: Insufficient documentation

## 2020-08-16 DIAGNOSIS — M6281 Muscle weakness (generalized): Secondary | ICD-10-CM | POA: Insufficient documentation

## 2020-08-19 ENCOUNTER — Encounter: Payer: Self-pay | Admitting: Occupational Therapy

## 2020-08-19 NOTE — Therapy (Signed)
[facility] MAIN [facility] SERVICES [address] [place], [place], [address] Phone: [phone]   Fax:  [phone]  Occupational Therapy Treatment  Patient Details  Name: Mark Oneal MRN: [id number] Date of Birth: 05-21-1986 Referring Provider (OT): Delle Reining   Encounter Date: 08/16/2020   OT End of Session - 08/19/20 1334    Visit Number 3    Number of Visits 24    Date for OT Re-Evaluation 10/20/20    Authorization Type medicaid pending, self pay    OT Start Time [phone]    OT Stop Time 1003    OT Time Calculation (min) 39 min    Activity Tolerance Patient tolerated treatment well    Behavior During Therapy [facility] for tasks assessed/performed           Past Medical History:  Diagnosis Date  . 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 BUBBLE STUDY  07/06/2020   Procedure: BUBBLE STUDY;  Surgeon: Little Ishikawa, MD;  Location: [facility] ENDOSCOPY;  Service: Cardiovascular;;  . IR ANGIO INTRA EXTRACRAN SEL COM CAROTID INNOMINATE UNI L MOD SED  07/03/2020  . IR CT HEAD LTD  07/03/2020  . IR CT HEAD LTD  07/03/2020  . IR PERCUTANEOUS ART THROMBECTOMY/INFUSION INTRACRANIAL INC DIAG ANGIO  07/03/2020  . IR US GUIDE VASC ACCESS RIGHT  07/03/2020  . RADIOLOGY WITH ANESTHESIA N/A 07/03/2020   Procedure: IR WITH ANESTHESIA;  Surgeon: Radiologist, Medication, MD;  Location: [facility] OR;  Service: Radiology;  Laterality: N/A;  . TEE WITHOUT CARDIOVERSION N/A 07/06/2020   Procedure: TRANSESOPHAGEAL ECHOCARDIOGRAM (TEE);  Surgeon: Little Ishikawa, MD;  Location: [facility] ENDOSCOPY;  Service: Cardiovascular;  Laterality: N/A;    There were no vitals filed for this visit.   Subjective Assessment - 08/19/20 1331    Subjective  Pt reports he has been using his hand more at home and is seeing a difference in his hand function, improving.  Interpreter from [facility] present throughout session, [place]    Pertinent History [name] is a 34 y.o. male with history of prior R-SCA infarct who was admitted on 07/03/2020 after a fall with resultant dysarthria and jerking of BUE/BLE.  On evaluation by EMS he was found to have right gaze deviation and left hemiparesis.  He was loaded with Keppra with recommendations to continue this due to seizure at stroke onset and in CT.   CT head showed hyperdense right MCA sign and CTA head/neck showed occlusion of right ICA 2 cm from origin with reconstitution by PCA and subsequent occlusion of proximal right M1 MCA.  He underwent cerebral angiogram with complete revascularization of occluded right ICA and ICA terminus, right MCA and right ACA with question of proximal right ICA dissection.  Hospitalization from 07/03/2020 to 07/22/2020, included inpatient rehab.    Patient Stated Goals Patient reports he would like to be as independent as possible at home and at work.    Currently in Pain? No/denies    Pain Score 0-No pain           Therapeutic Exercise: Patient seen for grip strengthening with use of resistive hand gripper on 5th setting, white spring, 27# for 25 reps for 2 sets each, cues for proper hand grip pattern and for sustained gripping.  Short rest breaks as needed.   Neuro: Patient seen for manipulation of [place] discs, picking up and placing into grid then utilizing bilateral UEs  to turn and flip working towards simultaneous movement patterns with right and left.  Then performed board with left hand only.  Able to stack up to 4 items at a time and move to case.   Cues for isolated finger movements for turning and flipping.  Will need to continue to work on improving speed of task.   Manipulation of small pieces of Purdue Pegboard with small dowels, washers and collars.  Patient demonstrates difficulty with smaller objects and especially when taking items apart, one piece at a time and sorting with left hand.    Response to tx: Patient  continues to progress in all areas, reports improvements with hand function for tasks at home.  Patient considering another job when returning to work with less physical demands.  Patient responds well to cues.  Continues to demonstrate limited speed, dexterity and coordination skills which limit his independence at work and home.  Continue to provide exercises and activities patient can do at home on a daily basis.                       OT Education - 08/19/20 1333    Education Details strength, coordination    Person(s) Educated Patient    Methods Demonstration    Comprehension Returned demonstration;Verbalized understanding               OT Long Term Goals - 08/01/20 1204      OT LONG TERM GOAL #1   Title Patient will demonstrate home exercise program with modified independence.    Baseline limited HEP at eval    Time 12    Period Weeks    Status New    Target Date 10/20/20      OT LONG TERM GOAL #2   Title Pt will improve left hand grip strength by 20# to be able to stablize and hold objects without dropping.    Baseline left grip 35# at eval    Time 12    Period Weeks    Status New    Target Date 10/20/20      OT LONG TERM GOAL #3   Title Pt will improve coordination speed with 9 hole peg test in less than 40 secs on left to be able to manipulate small objects.    Baseline left 9 hole peg test 1 min 9 sec.    Time 12    Period Weeks    Status New    Target Date 10/20/20      OT LONG TERM GOAL #4   Title Pt will improve strength in left UE by 1 mm grade to assist with lifting objects to place onto overhead shelf.    Baseline strength 4-/5 LUE    Time 6    Period Weeks    Status New    Target Date 09/08/20      OT LONG TERM GOAL #5   Title Pt will improve FOTO score to 74 or greater to demonstrate clinically relevant change to complete ADL and IADL tasks at home with modified independence.    Baseline score at eval 61    Time 12    Period Weeks     Status New    Target Date 10/20/20      Long Term Additional Goals   Additional Long Term Goals Yes      OT LONG TERM GOAL #6   Title Pt will demonstrate improvements in left UE strength to use tools  such as a shovel for potential return to work tasks.    Baseline unable at eval    Time 12    Period Weeks    Status New    Target Date 10/20/20                 Plan - 08/19/20 1349    Clinical Impression Statement Patient continues to progress in all areas, reports improvements with hand function for tasks at home.  Patient considering another job when returning to work with less physical demands.  Patient responds well to cues.  Continues to demonstrate limited speed, dexterity and coordination skills which limit his independence at work and home.  Continue to provide exercises and activities patient can do at home on a daily basis.           Patient will benefit from skilled therapeutic intervention in order to improve the following deficits and impairments:           Visit Diagnosis: Muscle weakness (generalized)  Other lack of coordination  Acute ischemic right MCA stroke [facility])    Problem List Patient Active Problem List   Diagnosis Date Noted  . Elevated liver function tests 08/02/2020  . Hemi-inattention--left 07/21/2020  . Acute ischemic right MCA stroke (HCC) 07/12/2020  . Dysphagia, post-stroke   . Aspiration pneumonia (HCC)   . Seizures (HCC) 07/07/2020  . Hyperlipidemia LDL goal <70 07/07/2020  . 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 Obesity 07/07/2020  . Leukocytosis 07/07/2020  . History of CVA with residual deficit   . Cerebral infarction due to embolism of R ICA, R MCA vs dissection (HCC) s/p IR w/ revascularization 07/03/2020  . 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 Encephalopathy acute    Marcelo Ickes Cornelius Moras, OTR/L, CLT  Tung Pustejovsky 08/19/2020, 1:50 PM  [facility] MAIN [facility]  SERVICES [address] [place], [place], [address] Phone: [phone]   Fax:  [phone]  Name: Stryker Veasey MRN: [id number] Date of Birth: [birth year]

## 2020-08-22 ENCOUNTER — Inpatient Hospital Stay: Payer: MEDICAID | Admitting: Adult Health

## 2020-08-23 ENCOUNTER — Other Ambulatory Visit: Payer: Self-pay

## 2020-08-23 ENCOUNTER — Encounter: Payer: Self-pay | Admitting: Occupational Therapy

## 2020-08-23 ENCOUNTER — Ambulatory Visit: Payer: Self-pay | Admitting: Occupational Therapy

## 2020-08-23 DIAGNOSIS — R278 Other lack of coordination: Secondary | ICD-10-CM

## 2020-08-23 DIAGNOSIS — M6281 Muscle weakness (generalized): Secondary | ICD-10-CM

## 2020-08-23 NOTE — Therapy (Addendum)
[facility] MAIN [facility] SERVICES [address] [place], [place], [address] Phone: [phone]   Fax:  [phone]  Occupational Therapy Treatment  Patient Details  Name: Mark Oneal MRN: [id number] Date of Birth: 09-30-1985 Referring Provider (OT): Delle Reining   Encounter Date: 08/23/2020   OT End of Session - 08/23/20 1208    Visit Number 4    Number of Visits 24    Date for OT Re-Evaluation 10/20/20    OT Start Time 0945    OT Stop Time 1019    OT Time Calculation (min) 34 min    Activity Tolerance Patient tolerated treatment well    Behavior During Therapy [facility] for tasks assessed/performed           Past Medical History:  Diagnosis Date  . 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 BUBBLE STUDY  07/06/2020   Procedure: BUBBLE STUDY;  Surgeon: Little Ishikawa, MD;  Location: [facility] ENDOSCOPY;  Service: Cardiovascular;;  . IR ANGIO INTRA EXTRACRAN SEL COM CAROTID INNOMINATE UNI L MOD SED  07/03/2020  . IR CT HEAD LTD  07/03/2020  . IR CT HEAD LTD  07/03/2020  . IR PERCUTANEOUS ART THROMBECTOMY/INFUSION INTRACRANIAL INC DIAG ANGIO  07/03/2020  . IR US GUIDE VASC ACCESS RIGHT  07/03/2020  . RADIOLOGY WITH ANESTHESIA N/A 07/03/2020   Procedure: IR WITH ANESTHESIA;  Surgeon: Radiologist, Medication, MD;  Location: [facility] OR;  Service: Radiology;  Laterality: N/A;  . TEE WITHOUT CARDIOVERSION N/A 07/06/2020   Procedure: TRANSESOPHAGEAL ECHOCARDIOGRAM (TEE);  Surgeon: Little Ishikawa, MD;  Location: [facility] ENDOSCOPY;  Service: Cardiovascular;  Laterality: N/A;    There were no vitals filed for this visit.   Subjective Assessment - 08/23/20 1207    Subjective  Pt. anticipated going to [place] on the 24th with his family for Christmas.    Pertinent History Mark Oneal is a 34 y.o. male with history of prior R-SCA infarct who was admitted on 07/03/2020 after a fall  with resultant dysarthria and jerking of BUE/BLE.  On evaluation by EMS he was found to have right gaze deviation and left hemiparesis.  He was loaded with Keppra with recommendations to continue this due to seizure at stroke onset and in CT.   CT head showed hyperdense right MCA sign and CTA head/neck showed occlusion of right ICA 2 cm from origin with reconstitution by PCA and subsequent occlusion of proximal right M1 MCA.  He underwent cerebral angiogram with complete revascularization of occluded right ICA and ICA terminus, right MCA and right ACA with question of proximal right ICA dissection.  Hospitalization from 07/03/2020 to 07/22/2020, included inpatient rehab.    Currently in Pain? No/denies          OT TREATMENT    Neuro muscular re-education:  Pt. worked on [facility] skills manipulating nuts, and bolts on a bolt board. Pt. Worked on screwing, and unscrewing nuts, and bolts of varying sizes, and challenging progressively smaller items. Pt. Worked on using his left hand with his vision occluded.  Therapeutic Exercise:  Pt. performed gross gripping with grip strengthener. Pt. worked on sustaining grip while grasping pegs and reaching at various heights. The gripper was set at 23.4#.  Spanish speaking interpreter services were provided by Maryjane Hurter. Pt. Continues to make progress  With his LUE functioning. Pt. Reports that he is now initiating reaching for a glass with his left hand, while using his  fork with his right hand during meals. Pt. Requires cues to use his left hand to manipulate the nuts, and bolts underneath the bolt board when his vision is occluded. Pt.initiates the task mostly with the right hand.  Pt. Continues to work on improving UE functioning in order to improve left hand FMC, and bilateral hand coordination.                      OT Education - 08/23/20 1208    Education Details strength, coordination    Person(s) Educated Patient    Methods Demonstration     Comprehension Returned demonstration;Verbalized understanding               OT Long Term Goals - 08/01/20 1204      OT LONG TERM GOAL #1   Title Patient will demonstrate home exercise program with modified independence.    Baseline limited HEP at eval    Time 12    Period Weeks    Status New    Target Date 10/20/20      OT LONG TERM GOAL #2   Title Pt will improve left hand grip strength by 20# to be able to stablize and hold objects without dropping.    Baseline left grip 35# at eval    Time 12    Period Weeks    Status New    Target Date 10/20/20      OT LONG TERM GOAL #3   Title Pt will improve coordination speed with 9 hole peg test in less than 40 secs on left to be able to manipulate small objects.    Baseline left 9 hole peg test 1 min 9 sec.    Time 12    Period Weeks    Status New    Target Date 10/20/20      OT LONG TERM GOAL #4   Title Pt will improve strength in left UE by 1 mm grade to assist with lifting objects to place onto overhead shelf.    Baseline strength 4-/5 LUE    Time 6    Period Weeks    Status New    Target Date 09/08/20      OT LONG TERM GOAL #5   Title Pt will improve FOTO score to 74 or greater to demonstrate clinically relevant change to complete ADL and IADL tasks at home with modified independence.    Baseline score at eval 61    Time 12    Period Weeks    Status New    Target Date 10/20/20      Long Term Additional Goals   Additional Long Term Goals Yes      OT LONG TERM GOAL #6   Title Pt will demonstrate improvements in left UE strength to use tools such as a shovel for potential return to work tasks.    Baseline unable at eval    Time 12    Period Weeks    Status New    Target Date 10/20/20                 Plan - 08/23/20 1209    Clinical Impression Statement Spanish speaking interpreter services were provided by Maryjane Hurter. Pt. Continues to make progress  With his LUE functioning. Pt. Reports that he is now  initiating reaching for a glass with his left han, while using his fork with his right hand during meals. Pt. Requires cues to use his left hand  to manipulate the nuts, and bolts underneath the bolt board when his vision is occluded. Pt.initiates the task mostly with the right hand.  Pt. Continues to work on improving UE functioning in order to improve left hand FMC, and bilateral hand coordination.   OT Occupational Profile and History Detailed Assessment- Review of Records and additional review of physical, cognitive, psychosocial history related to current functional performance    Occupational performance deficits (Please refer to evaluation for details): ADL's;IADL's;Social Participation;Work    Games developer / Function / Physical Skills ADL;Dexterity;ROM;Strength;Coordination;FMC;IADL;UE functional use;Decreased knowledge of use of DME    Rehab Potential Excellent    Clinical Decision Making Limited treatment options, no task modification necessary    Comorbidities Affecting Occupational Performance: May have comorbidities impacting occupational performance    Modification or Assistance to Complete Evaluation  No modification of tasks or assist necessary to complete eval    OT Frequency 2x / week    OT Duration 12 weeks    OT Treatment/Interventions Self-care/ADL training;Cryotherapy;Therapeutic exercise;DME and/or AE instruction;Neuromuscular education;Manual Therapy;Moist Heat;Contrast Bath;Therapeutic activities;Patient/family education    Consulted and Agree with Plan of Care Patient;Family member/caregiver           Patient will benefit from skilled therapeutic intervention in order to improve the following deficits and impairments:   Body Structure / Function / Physical Skills: ADL, Dexterity, ROM, Strength, Coordination, FMC, IADL, UE functional use, Decreased knowledge of use of DME       Visit Diagnosis: Muscle weakness (generalized)  Other lack of  coordination    Problem List Patient Active Problem List   Diagnosis Date Noted  . Elevated liver function tests 08/02/2020  . Hemi-inattention--left 07/21/2020  . Acute ischemic right MCA stroke (HCC) 07/12/2020  . Dysphagia, post-stroke   . Aspiration pneumonia (HCC)   . Seizures (HCC) 07/07/2020  . Hyperlipidemia LDL goal <70 07/07/2020  . 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 Obesity 07/07/2020  . Leukocytosis 07/07/2020  . History of CVA with residual deficit   . Cerebral infarction due to embolism of R ICA, R MCA vs dissection (HCC) s/p IR w/ revascularization 07/03/2020  . 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 Encephalopathy acute     Olegario Messier, MS, OTR/L 08/23/2020, 12:11 PM  [facility] Main MAIN [facility] SERVICES [address] [place], [place], [address] Phone: [phone]   Fax:  [phone]  Name: Mark Oneal MRN: [id number] Date of Birth: March 14, 1986

## 2020-08-29 ENCOUNTER — Ambulatory Visit: Payer: Self-pay | Admitting: Occupational Therapy

## 2020-08-29 ENCOUNTER — Other Ambulatory Visit: Payer: Self-pay

## 2020-08-29 DIAGNOSIS — R278 Other lack of coordination: Secondary | ICD-10-CM

## 2020-08-29 DIAGNOSIS — I63511 Cerebral infarction due to unspecified occlusion or stenosis of right middle cerebral artery: Secondary | ICD-10-CM

## 2020-08-29 DIAGNOSIS — M6281 Muscle weakness (generalized): Secondary | ICD-10-CM

## 2020-08-29 NOTE — Therapy (Signed)
[place] MAIN [facility] SERVICES [address] [place], [place], [address] Phone: [phone]   Fax:  [phone]  Occupational Therapy Treatment/Discharge Summary   Patient Details  Name: Mark Oneal MRN: [id number] Date of Birth: [birth year] Referring Provider (OT): Reesa Chew   Encounter Date: 08/29/2020   OT End of Session - 08/31/20 2139    Visit Number 5    Number of Visits 24    Date for OT Re-Evaluation 10/20/20    Authorization Type medicaid pending, self pay    OT Start Time [phone]    OT Stop Time 1000    OT Time Calculation (min) 42 min    Activity Tolerance Patient tolerated treatment well    Behavior During Therapy [facility] for tasks assessed/performed           Past Medical History:  Diagnosis Date  . 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 BUBBLE STUDY  07/06/2020   Procedure: BUBBLE STUDY;  Surgeon: Donato Heinz, MD;  Location: [facility] ENDOSCOPY;  Service: Cardiovascular;;  . IR ANGIO INTRA EXTRACRAN SEL COM CAROTID INNOMINATE UNI L MOD SED  07/03/2020  . IR CT HEAD LTD  07/03/2020  . IR CT HEAD LTD  07/03/2020  . IR PERCUTANEOUS ART THROMBECTOMY/INFUSION INTRACRANIAL INC DIAG ANGIO  07/03/2020  . IR US GUIDE VASC ACCESS RIGHT  07/03/2020  . RADIOLOGY WITH ANESTHESIA N/A 07/03/2020   Procedure: IR WITH ANESTHESIA;  Surgeon: Radiologist, Medication, MD;  Location: [place];  Service: Radiology;  Laterality: N/A;  . TEE WITHOUT CARDIOVERSION N/A 07/06/2020   Procedure: TRANSESOPHAGEAL ECHOCARDIOGRAM (TEE);  Surgeon: Donato Heinz, MD;  Location: [facility] ENDOSCOPY;  Service: Cardiovascular;  Laterality: N/A;    There were no vitals filed for this visit.   Subjective Assessment - 08/31/20 2137    Subjective  Patient reports he is going great, is feeling stronger and close to "normal'.  He feels he can continue with exercises at home.    Pertinent  History Mark Oneal is a 34 y.o. male with history of prior R-SCA infarct who was admitted on 07/03/2020 after a fall with resultant dysarthria and jerking of BUE/BLE.  On evaluation by EMS he was found to have right gaze deviation and left hemiparesis.  He was loaded with Keppra with recommendations to continue this due to seizure at stroke onset and in CT.   CT head showed hyperdense right MCA sign and CTA head/neck showed occlusion of right ICA 2 cm from origin with reconstitution by PCA and subsequent occlusion of proximal right M1 MCA.  He underwent cerebral angiogram with complete revascularization of occluded right ICA and ICA terminus, right MCA and right ACA with question of proximal right ICA dissection.  Hospitalization from 07/03/2020 to 07/22/2020, included inpatient rehab.    Patient Stated Goals Patient reports he would like to be as independent as possible at home and at work.    Currently in Pain? No/denies              [facility] OT Assessment - 08/31/20 2145      Assessment   Medical Diagnosis CVA    Referring Provider (OT) Love, Pamela    Hand Dominance Right    Prior Therapy yes      ADL   Eating/Feeding Modified independent    Grooming Modified independent    Upper Body Bathing Modified independent    Lower Body Bathing Modified independent  Upper Body Dressing Independent    Lower Body Dressing Modified independent    Toilet Transfer Modified independent    Toileting - Clothing Manipulation Modified independent    Tub/Shower Transfer Modified independent    ADL comments Pt performing basic self care independently, able to complete home management tasks.  Has not returned to driving or work yet.      Observation/Other Assessments   Focus on Therapeutic Outcomes (FOTO)  67      Coordination   Right 9 Hole Peg Test 25    Left 9 Hole Peg Test 32      Hand Function   Right Hand Grip (lbs) 95    Right Hand Lateral Pinch 20 lbs    Right Hand 3 Point Pinch 14 lbs     Left Hand Grip (lbs) 80    Left Hand Lateral Pinch 21 lbs    Left 3 point pinch 17 lbs           Interpreter Genia Plants present this date Patient seen for reassessment of hand function and skills as noted in above flowsheet.  Patient instructed on HEP for ROM, strengthening, issued firm blue theraputty this date for continued focus on grip strengthening at home.  Patient able to demonstrate understanding of gripping exs, resistive pinch for lateral, 3 point and 2 point pinch.   Issued and instructed on fine motor coordination exercises for home to continue to refine hand skills.  Demonstration and instruction of use of cards for shuffling, dealing, flipping using thumb and finger combinations.  Patient also performing manipulation of coins and other small objects, emphasis on translatory skills of the hand and using the hand for storage.   Patient able to demonstrate understanding of home program and has no additional questions.     Response to tx: Patient feels he is nearing his baseline, would like to continue with exercises at home with remainder of his recovery secondary to lack of insurance.  Pt has made excellent progress in all areas, grip, pinch and coordination skills with significant progress.  FOTO score improved overall.  Patient is independent with home program.  Most goals met except use of work tools such as a shovel (wife has not wanted patient to try these items yet) and FOTO score improved but not yet at projected outcome of 74.  Will plan to discharge patient at this time and recommend continued focus on these areas with HEP.                   OT Education - 08/31/20 2138    Education Details HEP, strength, coordination, goals, outcomes for discharge    Person(s) Educated Patient    Methods Demonstration;Explanation;Handout    Comprehension Returned demonstration;Verbalized understanding               OT Long Term Goals - 08/31/20 2140      OT LONG  TERM GOAL #1   Title Patient will demonstrate home exercise program with modified independence.    Baseline limited HEP at eval    Time 12    Period Weeks    Status Achieved      OT LONG TERM GOAL #2   Title Pt will improve left hand grip strength by 20# to be able to stablize and hold objects without dropping.    Baseline left grip 35# at eval    Time 12    Period Weeks    Status Achieved  OT LONG TERM GOAL #3   Title Pt will improve coordination speed with 9 hole peg test in less than 40 secs on left to be able to manipulate small objects.    Baseline left 9 hole peg test 1 min 9 sec.    Time 12    Period Weeks    Status Achieved      OT LONG TERM GOAL #4   Title Pt will improve strength in left UE by 1 mm grade to assist with lifting objects to place onto overhead shelf.    Baseline strength 4-/5 LUE    Time 6    Period Weeks    Status Achieved      OT LONG TERM GOAL #5   Title Pt will improve FOTO score to 74 or greater to demonstrate clinically relevant change to complete ADL and IADL tasks at home with modified independence.    Baseline score at eval 61, at D/C 67    Time 12    Period Weeks    Status Partially Met      OT LONG TERM GOAL #6   Title Pt will demonstrate improvements in left UE strength to use tools such as a shovel for potential return to work tasks.    Baseline unable at eval, at discharge patient has not yet used a shovel, demonstrates strength and coordination skills necessary to perform    Time 12    Period Weeks    Status Not Met                 Plan - 08/31/20 2140    Clinical Impression Statement Patient feels he is nearing his baseline, would like to continue with exercises at home with remainder of his recovery secondary to lack of insurance.  Pt has made excellent progress in all areas, grip, pinch and coordination skills with significant progress.  FOTO score improved overall.  Patient is independent with home program.  Most goals  met except use of work tools such as a shovel (wife has not wanted patient to try these items yet) and FOTO score improved but not yet at projected outcome of 74.  Will plan to discharge patient at this time and recommend continued focus on these areas with HEP.    OT Occupational Profile and History Detailed Assessment- Review of Records and additional review of physical, cognitive, psychosocial history related to current functional performance    Occupational performance deficits (Please refer to evaluation for details): ADL's;IADL's;Social Participation;Work    Marketing executive / Function / Physical Skills ADL;Dexterity;ROM;Strength;Coordination;FMC;IADL;UE functional use;Decreased knowledge of use of DME    Rehab Potential Excellent    Clinical Decision Making Limited treatment options, no task modification necessary    Comorbidities Affecting Occupational Performance: May have comorbidities impacting occupational performance    Modification or Assistance to Complete Evaluation  No modification of tasks or assist necessary to complete eval    OT Frequency 2x / week    OT Duration 12 weeks    OT Treatment/Interventions Self-care/ADL training;Cryotherapy;Therapeutic exercise;DME and/or AE instruction;Neuromuscular education;Manual Therapy;Moist Heat;Contrast Bath;Therapeutic activities;Patient/family education    Consulted and Agree with Plan of Care Patient;Family member/caregiver           Patient will benefit from skilled therapeutic intervention in order to improve the following deficits and impairments:   Body Structure / Function / Physical Skills: ADL,Dexterity,ROM,Strength,Coordination,FMC,IADL,UE functional use,Decreased knowledge of use of DME       Visit Diagnosis: Muscle weakness (generalized)  Other lack  of coordination  Acute ischemic right MCA stroke [facility])    Problem List Patient Active Problem List   Diagnosis Date Noted  . Elevated liver function tests 08/02/2020   . Hemi-inattention--left 07/21/2020  . Acute ischemic right MCA stroke ([place]) 07/12/2020  . Dysphagia, post-stroke   . Aspiration pneumonia ([place])   . Seizures ([place]) 07/07/2020  . Hyperlipidemia LDL goal <70 07/07/2020  . 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 Obesity 07/07/2020  . Leukocytosis 07/07/2020  . History of CVA with residual deficit   . Cerebral infarction due to embolism of R ICA, R MCA vs dissection (HCC) s/p IR w/ revascularization 07/03/2020  . 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 Encephalopathy acute    Mark Oneal, Mark Oneal, Mark Oneal  Mark Oneal 08/31/2020, 9:58 PM  [place] MAIN [facility] SERVICES [address] [place], [place], [address] Phone: [phone]   Fax:  [phone]  Name: Casin Federici MRN: [id number] Date of Birth: 27-Jul-1986

## 2020-08-31 ENCOUNTER — Telehealth: Payer: Self-pay | Admitting: Physical Medicine & Rehabilitation

## 2020-08-31 NOTE — Telephone Encounter (Signed)
Patient is not feeling well has questions regarding depression Medication

## 2020-09-01 NOTE — Telephone Encounter (Signed)
I called patient and phone went right to VM. Left a brief message. Need to know  More details about what is going on.

## 2020-09-04 ENCOUNTER — Telehealth: Payer: Self-pay

## 2020-09-04 NOTE — Telephone Encounter (Signed)
I called patient back with [employer] (Angelica ID # [id number]). Patient was having a problem paying for his medications. He has no insurance.   Patient advised to call [facility] for Rxs and to establish PCP with [facility]. He was also given [email or web address] as an resource for low cost medications.

## 2020-09-04 NOTE — Telephone Encounter (Signed)
Task completed

## 2020-09-07 ENCOUNTER — Ambulatory Visit (INDEPENDENT_AMBULATORY_CARE_PROVIDER_SITE_OTHER): Payer: Self-pay | Admitting: Adult Health

## 2020-09-07 ENCOUNTER — Other Ambulatory Visit: Payer: Self-pay

## 2020-09-07 NOTE — Progress Notes (Deleted)
[facility] [address] [place]. [place] [address] [phone]       HOSPITAL FOLLOW UP NOTE  [name] Date of Birth:  Jan 12, 1986 Medical Record Number:  [id number]   Reason for Referral:  hospital stroke follow up    SUBJECTIVE:   CHIEF COMPLAINT:  No chief complaint on file.   HPI:   Mr.Mark Oneal a 34 y.o.malewith history of R SCAstroke 1 yr ago while living in [place] w/ residual limpwho developedslurred speech and numbness followed by collapse and possible seizure on 07/03/2020. Found to haveLsided weakness w/ neglect and R gazeon arrival to ED. personally reviewed hospitalization pertinent progress notes, lab work and imaging with summary provided.  Stroke work-up revealed right MCA infarct due to right ICA and MCA occlusion s/p tPA and IR with TICI 3 revascularization, infarct embolic secondary to unknown source vs possible dissection.  Post IR CT showed new/increased edema R BG, R insula and anterior temporal lobe w/ mild mass-effect R ventricle.  LE Doppler negative for DVT.  Hypercoagulable labs show slightly elevated ACL IgM.  Recommended DAPT for 3 weeks and Plavix alone as previously on aspirin.  Due to seizure witnessed at stroke onset and seizure in CT scanner, he was loaded with Keppra and placed on Keppra 500 mg every 12 hours.  EEG showed diffuse encephalopathy with generalized slowing but no evidence of seizures.  Prior stroke history in 05/2019 right cerebellar stroke while in [place] of unknown etiology possibly due to heavy tobacco use, EtOH and cocaine use but since complete cessation.  Elevated triglycerides with direct LDL 151 and initiate atorvastatin 80 mg daily.  Other stroke risk factors include obesity.  Other active problems during admission including resolved acute respiratory failure and likely aspiration pneumonia with leukocytosis.  Residual deficits of mild dysarthria, left facial droop, left hemiparesis and dysphagia.   Evaluated by therapies and discharged to CIR for ongoing therapy needs.  Stroke: R MCA infarct due to right ICA and MCA occlusion s/p tPA and IR w/ TICI3 revascularization,embolic, secondary to unknown source vs possible dissection   Code Stroke CT head No acute hemorrhage. Suspect R MCA thrombus w/ acute infarct.ASPECTS 8.   CTA head &neck cervical R ICA occlusion2cm above origin w/ reconstitution and subsequent occlusion proximal R M1 extending into bifurcation   Cerebral angio / IR extracranial R ICA/terminus, R MCA occlusion w/ TICI3 revascularization.Possible R ICA dissection  Repeat CT head dense contrast staining posterior R lentiform (no hemorrhage). New/increased edema R basal ganglia, R insula, anterior temporal lobe w/ mild mass effect R lateral ventricle. 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  MRI right MCA multifocal infarcts  LE Dopplerno DVT  EEG- 10/19 - Continuousslow, generalized suggestive of severe diffuse encephalopathy, nonspecific etiology.No seizures or epileptiform discharges were seen throughout the recording.  2D EchoEF 60-65%  TEE No SOE, neg bubble  TG823, direct LDL 151   HgbA1c- 5.6  UDS - negative  Hypercoagulable labs only ACL IgM 16 slightly elevated o/w negative VTE prophylaxis - Lovenox 40 mg sq daily   aspirin 81 mg dailyprior to admission, now on aspirin 81 and the Plavix 75 DAPT for 3 weeks and then Plavix alone.  Therapy recommendations: CIR   Disposition: CIR        ROS:   14 system review of systems performed and negative with exception of ***  PMH:  Past Medical History:  Diagnosis Date  . 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 BUBBLE STUDY  07/06/2020   Procedure: BUBBLE STUDY;  Surgeon: Little Ishikawa, MD;  Location: [facility] ENDOSCOPY;  Service: Cardiovascular;;  . IR ANGIO INTRA EXTRACRAN SEL COM CAROTID INNOMINATE UNI L MOD SED   07/03/2020  . IR CT HEAD LTD  07/03/2020  . IR CT HEAD LTD  07/03/2020  . IR PERCUTANEOUS ART THROMBECTOMY/INFUSION INTRACRANIAL INC DIAG ANGIO  07/03/2020  . IR US GUIDE VASC ACCESS RIGHT  07/03/2020  . RADIOLOGY WITH ANESTHESIA N/A 07/03/2020   Procedure: IR WITH ANESTHESIA;  Surgeon: Radiologist, Medication, MD;  Location: [facility] OR;  Service: Radiology;  Laterality: N/A;  . TEE WITHOUT CARDIOVERSION N/A 07/06/2020   Procedure: TRANSESOPHAGEAL ECHOCARDIOGRAM (TEE);  Surgeon: Little Ishikawa, MD;  Location: [facility] ENDOSCOPY;  Service: Cardiovascular;  Laterality: N/A;    Social History:  Social History   Socioeconomic History  . Marital status: Single    Spouse name: [name]  . Number of children: 2  . Years of education: Not on file  . Highest education level: Not on file  Occupational History  . Not on file  Tobacco Use  . Smoking status: Former Smoker    Types: Cigarettes  . Smokeless tobacco: Never Used  . Tobacco comment: quit after stroke in 2020  Substance and Sexual Activity  . Alcohol use: Yes    Alcohol/week: 3.0 standard drinks    Types: 3 Cans of beer per week    Comment: 20 ounce cans  . Drug use: Not on file  . Sexual activity: Not on file  Other Topics Concern  . Not on file  Social History Narrative   Lives with his partner of 8 years and has 2 kids with her. All of his family is in a different country.   Social Determinants of Health   Financial Resource Strain: Not on file  Food Insecurity: Not on file  Transportation Needs: Not on file  Physical Activity: Not on file  Stress: Not on file  Social Connections: Not on file  Intimate Partner Violence: Not on file    Family History:  Family History  Problem Relation Age of Onset  . Hypertension Mother   . Hypertension Father     Medications:   Current Outpatient Medications on File Prior to Visit  Medication Sig Dispense Refill  . atorvastatin (LIPITOR) 80 MG tablet Take 1 tablet (80 mg total)  by mouth daily. 30 tablet 3  . clopidogrel (PLAVIX) 75 MG tablet Take 1 tablet (75 mg total) by mouth daily. 30 tablet 3  . docusate sodium (COLACE) 100 MG capsule Take 1 capsule (100 mg total) by mouth 2 (two) times daily. 60 capsule 0  . levETIRAcetam (KEPPRA) 500 MG tablet Take 1 tablet (500 mg total) by mouth 2 (two) times daily. 120 tablet 3  . Multiple Vitamin (MULTIVITAMIN WITH MINERALS) TABS tablet Take 1 tablet by mouth daily. 30 tablet 0  . pantoprazole (PROTONIX) 40 MG tablet Take 1 tablet (40 mg total) by mouth daily at 12 noon. 30 tablet 3  . senna-docusate (SENOKOT-S) 8.6-50 MG tablet Take 1 tablet by mouth 2 (two) times daily.     No current facility-administered medications on file prior to visit.    Allergies:  No Known Allergies    OBJECTIVE:  Physical Exam  There were no vitals filed for this visit. There is no height or weight on file to calculate BMI. No exam data present  Depression screen [facility] 2/9 08/02/2020  Decreased Interest  2  Down, Depressed, Hopeless 2  PHQ - 2 Score 4  Altered sleeping 0  Tired, decreased energy 2  Change in appetite 0  Feeling bad or failure about yourself  2  Trouble concentrating 0  Moving slowly or fidgety/restless 2  Suicidal thoughts 0  PHQ-9 Score 10     General: well developed, well nourished, seated, in no evident distress Head: head normocephalic and atraumatic.   Neck: supple with no carotid or supraclavicular bruits Cardiovascular: regular rate and rhythm, no murmurs Musculoskeletal: no deformity Skin:  no rash/petichiae Vascular:  Normal pulses all extremities   Neurologic Exam Mental Status: Awake and fully alert. Oriented to place and time. Recent and remote memory intact. Attention span, concentration and fund of knowledge appropriate. Mood and affect appropriate.  Cranial Nerves: Fundoscopic exam reveals sharp disc margins. Pupils equal, briskly reactive to light. Extraocular movements full without  nystagmus. Visual fields full to confrontation. Hearing intact. Facial sensation intact. Face, tongue, palate moves normally and symmetrically.  Motor: Normal bulk and tone. Normal strength in all tested extremity muscles.   Dlt Bic Tri FgS Grp HF  KnF KnE PIF DoF  R 5 5 5 5 5 5 5 5 5 5   L 5 5 5 5 5 5 5 5 5 5   Sensory.: intact to touch , pinprick , position and vibratory sensation.  Coordination: Rapid alternating movements normal in all extremities. Finger-to-nose and heel-to-shin performed accurately bilaterally. Gait and Station: Arises from chair without difficulty. Stance is normal. Gait demonstrates normal stride length and balance Reflexes: 1+ and symmetric. Toes downgoing.     NIHSS  *** Modified Rankin  ***      ASSESSMENT: Mark Oneal is a 34 y.o. year old male presented with slurred speech and numbness followed by collapse and possible seizure with evidence of left-sided weakness w/ neglect and R gaze on 07/03/2020 with stroke work-up revealing right MCA infarct due to right ICA and MCA occlusion s/p tPA and IR w/ TICI 3 revascularization, infarct embolic secondary to unknown source vs possible dissection. Vascular risk factors include hx of R SCA stroke 05/2019 while living in 07/05/2020, history of EtOH, tobacco and cocaine use, HLD and obesity.      PLAN:  1. R MCA stroke : Residual deficit: ***. Continue clopidogrel 75 mg daily  and atorvastatin 80 mg daily for secondary stroke prevention.  Discussed secondary stroke prevention measures and importance of close PCP follow up for aggressive stroke risk factor management  2. Seizures at stroke onset: Denies any additional seizure activity.  Continue Keppra 500 mg twice daily for seizure prophylaxis 3. HLD: LDL goal <70. Recent direct LDL 151 and initiated atorvastatin 80 mg daily.  4.     Follow up in *** or call earlier if needed   I spent *** minutes of face-to-face and non-face-to-face time with patient.  This  included previsit chart review, lab review, study review, order entry, electronic health record documentation, patient education regarding recent stroke, residual deficits, importance of managing stroke risk factors and answered all questions to patient satisfaction     06/2019, [facility]  [facility] [address] [place], [address] [place]  Phone [phone] Fax [phone] Note: This document was prepared with digital dictation and possible smart phrase technology. Any transcriptional errors that result from this process are unintentional.

## 2020-09-20 ENCOUNTER — Encounter: Payer: Self-pay | Admitting: Occupational Therapy

## 2020-09-27 ENCOUNTER — Encounter: Payer: Self-pay | Attending: Physical Medicine & Rehabilitation | Admitting: Physical Medicine & Rehabilitation

## 2020-09-27 ENCOUNTER — Other Ambulatory Visit: Payer: Self-pay

## 2020-09-27 ENCOUNTER — Encounter: Payer: Self-pay | Admitting: Physical Medicine & Rehabilitation

## 2020-09-27 VITALS — BP 119/79 | HR 83 | Temp 98.2°F | Ht 64.0 in | Wt 178.8 lb

## 2020-09-27 DIAGNOSIS — R569 Unspecified convulsions: Secondary | ICD-10-CM | POA: Insufficient documentation

## 2020-09-27 DIAGNOSIS — I6601 Occlusion and stenosis of right middle cerebral artery: Secondary | ICD-10-CM | POA: Insufficient documentation

## 2020-09-27 DIAGNOSIS — F329 Major depressive disorder, single episode, unspecified: Secondary | ICD-10-CM | POA: Insufficient documentation

## 2020-09-27 MED ORDER — CITALOPRAM HYDROBROMIDE 10 MG PO TABS: 10.0000 mg | ORAL_TABLET | Freq: Every day | ORAL | 2 refills | Status: DC

## 2020-09-27 NOTE — Patient Instructions (Signed)
PLEASE FEEL FREE TO CALL OUR OFFICE WITH ANY PROBLEMS OR QUESTIONS [phone])                    NO DRIVING UNTIL 12/15/64   YOU MAY RETURN TO WORK   10MG  CELEXA FOR MOOD/SLEEP AT BEDTIME

## 2020-09-27 NOTE — Progress Notes (Signed)
Subjective:    Patient ID: Mark Oneal, male    DOB: 10-29-1985, 35 y.o.   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  I last saw him in November.  He feels very well and that his left side has recovered completely. He wants to go back to work.   He is having difficulty sleeping because he's worried about money and going back to work. He also feels depressed about the stroke and the effects it's had on him and whether he might have another stroke. He feels tired a lot. He remains on keppra for sz prophylaxis and denies any further seizure  He asks what his risk are for more strokes in his life. He is no longer drinking or smoking, using drugs. He did ask if he could have a beer on occasions.  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 It's 8 hours a day, 5 days per week.   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 Etiology suspected is heavy etoh, smoking and cocaine use PTA. He had a prior stroke in [place] prior to moving here. He has an upcoming appt with neurology       Pain Inventory Average Pain 0 Pain Right Now 0 My pain is na  In the last 24 hours, has pain interfered with the following? General activity 0 Relation with others 0 Enjoyment of life 0 What TIME of day is your pain at its worst? na Sleep (in general) Fair  Pain is worse with: na Pain improves with: na Relief from Meds: na  Family History  Problem Relation Age of Onset  . Hypertension Mother   . Hypertension Father    Social History   Socioeconomic History  . Marital status: Single    Spouse name: [name]  . Number of children: 2  . Years of education: Not on file  . Highest education level: Not on file  Occupational History  . Not on file  Tobacco Use  . Smoking status: Former Smoker    Types: Cigarettes  . Smokeless tobacco: Never Used  . Tobacco comment: quit after stroke in 2020   Substance and Sexual Activity  . Alcohol use: Yes    Alcohol/week: 3.0 standard drinks    Types: 3 Cans of beer per week    Comment: 20 ounce cans  . Drug use: Not on file  . Sexual activity: Not on file  Other Topics Concern  . Not on file  Social History Narrative   Lives with his partner of 8 years and has 2 kids with her. All of his family is in a different country.   Social Determinants of Health   Financial Resource Strain: Not on file  Food Insecurity: Not on file  Transportation Needs: Not on file  Physical Activity: Not on file  Stress: Not on file  Social Connections: Not on file   Past Surgical History:  Procedure Laterality Date  . BUBBLE STUDY  07/06/2020   Procedure: BUBBLE STUDY;  Surgeon: Little Ishikawa, MD;  Location: [facility] ENDOSCOPY;  Service: Cardiovascular;;  . IR ANGIO INTRA EXTRACRAN SEL COM CAROTID INNOMINATE UNI L MOD SED  07/03/2020  . IR CT HEAD LTD  07/03/2020  . IR CT HEAD LTD  07/03/2020  . IR PERCUTANEOUS ART THROMBECTOMY/INFUSION INTRACRANIAL INC DIAG ANGIO  07/03/2020  . IR US GUIDE VASC ACCESS  RIGHT  07/03/2020  . RADIOLOGY WITH ANESTHESIA N/A 07/03/2020   Procedure: IR WITH ANESTHESIA;  Surgeon: Radiologist, Medication, MD;  Location: [facility] OR;  Service: Radiology;  Laterality: N/A;  . TEE WITHOUT CARDIOVERSION N/A 07/06/2020   Procedure: TRANSESOPHAGEAL ECHOCARDIOGRAM (TEE);  Surgeon: Little Ishikawa, MD;  Location: [facility] ENDOSCOPY;  Service: Cardiovascular;  Laterality: N/A;   Past Surgical History:  Procedure Laterality Date  . BUBBLE STUDY  07/06/2020   Procedure: BUBBLE STUDY;  Surgeon: Little Ishikawa, MD;  Location: [facility] ENDOSCOPY;  Service: Cardiovascular;;  . IR ANGIO INTRA EXTRACRAN SEL COM CAROTID INNOMINATE UNI L MOD SED  07/03/2020  . IR CT HEAD LTD  07/03/2020  . IR CT HEAD LTD  07/03/2020  . IR PERCUTANEOUS ART THROMBECTOMY/INFUSION INTRACRANIAL INC DIAG ANGIO  07/03/2020  . IR US GUIDE VASC ACCESS RIGHT   07/03/2020  . RADIOLOGY WITH ANESTHESIA N/A 07/03/2020   Procedure: IR WITH ANESTHESIA;  Surgeon: Radiologist, Medication, MD;  Location: [facility] OR;  Service: Radiology;  Laterality: N/A;  . TEE WITHOUT CARDIOVERSION N/A 07/06/2020   Procedure: TRANSESOPHAGEAL ECHOCARDIOGRAM (TEE);  Surgeon: Little Ishikawa, MD;  Location: [facility] ENDOSCOPY;  Service: Cardiovascular;  Laterality: N/A;   Past Medical History:  Diagnosis Date  . History of stroke involving cerebellum 05/2019   BP 119/79   Pulse 83   Temp 98.2 F (36.8 C)   Ht 5\' 4"  (1.626 m)   Wt 178 lb 12.8 oz (81.1 kg)   SpO2 98%   BMI 30.69 kg/m   Opioid Risk Score:   Fall Risk Score:  `1  Depression screen PHQ 2/9  Depression screen PHQ 2/9 08/02/2020  Decreased Interest 2  Down, Depressed, Hopeless 2  PHQ - 2 Score 4  Altered sleeping 0  Tired, decreased energy 2  Change in appetite 0  Feeling bad or failure about yourself  2  Trouble concentrating 0  Moving slowly or fidgety/restless 2  Suicidal thoughts 0  PHQ-9 Score 10   Review of Systems     Objective:   Physical Exam General: No acute distress HEENT: EOMI, oral membranes moist Cards: reg rate  Chest: normal effort Abdomen: Soft, NT, ND Skin: dry, intact Extremities: no edema Psych: pleasant and appropriate Neuro: Alert and oriented x 3. Normal insight and awareness. Intact Memory. Normal language and speech. Cranial nerve exam unremarkable. normal motor and sensory. ?mild left PD. Ambulated without issues. Performed heel to toe ambulation without any difficulties whatsoever.  Musculoskeletal: Normal range of motion.  No joint or axial pain. Psych: pleasant, normal affect, perhaps a little anxious.          Assessment & Plan:  1.  Left hemiparesis and functional deficits secondary to right MCA infarct             -Continue with home exercise program             -allowed him to go back to work which is light duty. Unfortunately he cannot start par  time  -discussed risk factors for further stroke, do's, don'ts moving forward, etc. I recommend that he completely abstain from drinking given his history.  2.  Transaminitis:             -resolved 3.  Seizures: Keppra             -No driving until 08/04/2020 or until f/u with NS. Has had no more seizures             -he will also  speak with neurology regarding further seizure management             -Referral has already been made to [facility] 4.  Dysphagia             -Resolved 5. Reactive depresion:  -trial of celexa 10mg   -should improve as he gets back to work and life. Sexual function should improve as well.   30 minutes of face to face patient care time were spent during this visit. All questions were encouraged and answered. Follow up with me in 4 mos.

## 2020-09-28 ENCOUNTER — Telehealth: Payer: Self-pay | Admitting: Adult Health

## 2020-09-28 NOTE — Telephone Encounter (Signed)
FYI: Notified Pt of bad weather on Sunday. Ask patient to call the office on Monday before leaving your home to make sure the office is open. Pt confirmed understood 

## 2020-10-02 ENCOUNTER — Inpatient Hospital Stay: Payer: MEDICAID | Admitting: Adult Health

## 2020-10-04 ENCOUNTER — Inpatient Hospital Stay: Payer: MEDICAID | Admitting: Adult Health

## 2020-10-05 ENCOUNTER — Other Ambulatory Visit: Payer: Self-pay

## 2020-10-05 NOTE — Patient Outreach (Signed)
[facility]) Care Management  10/05/2020  Mark Oneal 10-Mar-1986 [id number]  Telephone outreach to patient to obtain mRS was successfully completed. MRS= 3  Thank you, [name] Care Management Assistant

## 2020-10-17 ENCOUNTER — Other Ambulatory Visit: Payer: Self-pay

## 2020-10-17 ENCOUNTER — Telehealth: Payer: Self-pay | Admitting: Adult Health

## 2020-10-17 ENCOUNTER — Encounter: Payer: Self-pay | Admitting: Adult Health

## 2020-10-17 ENCOUNTER — Ambulatory Visit: Payer: MEDICAID | Admitting: Adult Health

## 2020-10-17 VITALS — BP 126/74 | HR 78 | Ht 63.0 in | Wt 181.0 lb

## 2020-10-17 DIAGNOSIS — E785 Hyperlipidemia, unspecified: Secondary | ICD-10-CM

## 2020-10-17 DIAGNOSIS — I7771 Dissection of carotid artery: Secondary | ICD-10-CM

## 2020-10-17 DIAGNOSIS — R569 Unspecified convulsions: Secondary | ICD-10-CM

## 2020-10-17 DIAGNOSIS — I6601 Occlusion and stenosis of right middle cerebral artery: Secondary | ICD-10-CM

## 2020-10-17 DIAGNOSIS — I63131 Cerebral infarction due to embolism of right carotid artery: Secondary | ICD-10-CM

## 2020-10-17 DIAGNOSIS — I639 Cerebral infarction, unspecified: Secondary | ICD-10-CM

## 2020-10-17 NOTE — Progress Notes (Signed)
[facility] [address] [place]. [place] [address] [phone]       HOSPITAL FOLLOW UP NOTE  [name] Date of Birth:  16-May-1986 Medical Record Number:  [id number]   Reason for Referral:  hospital stroke follow up    SUBJECTIVE:   CHIEF COMPLAINT:  Chief Complaint  Patient presents with  . Follow-up    Rm 14 alone Denies residual deficits.  Increased anxiety since stroke Had a Stroke in [place] and doc told him 99% of his stroke came from cocaine use.    HPI:   Mr.Mark Oneal a 35 y.o.malewith history of R SCAstroke 1 yr ago while living in [place] w/ residual limpwho developedslurred speech and numbness followed by collapse and possible seizure on 07/03/2020. Found to haveLsided weakness w/ neglect and R gaze on arrival to ED.  Personally reviewed hospitalization pertinent progress notes, lab work and imaging with summary provided.  Stroke work-up revealed right MCA infarct due to right ICA and MCA occlusion s/p tPA and IR with TICI three revascularization, embolic secondary to unknown source vs possible right ICA dissection.  LE Doppler negative.  TEE no evidence of cardiac source of embolus or PFO.  Recommended DAPT for 3 weeks and Plavix alone as previously on aspirin.  Evidence of seizure and stroke onset well with Keppra and initiated Keppra 500 mg twice daily for seizure prophylaxis.  EEG generalized slowing suggestive of diffuse encephalopathy but no evidence of seizures or epileptiform discharges.  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  Direct LDL 151 with elevated triglycerides (UTC LDL) and initiate atorvastatin 80 mg daily.  No prior history of HTN or DM.  Hospital course complicated by leukocytosis with low-grade fevers and tachypnea likely due to aspiration pneumonia treated with antibiotics.  Residual deficits of mild  dysarthria, left facial droop, left hemiparesis and dysphagia.  Evaluated by therapies and discharged to CIR for ongoing therapy needs on 07/12/2020.  Stroke: R MCA infarct due to right ICA and MCA occlusion s/p tPA and IR w/ TICI3 revascularization,embolic, secondary to unknown source vs possible dissection   Code Stroke CT head No acute hemorrhage. Suspect R MCA thrombus w/ acute infarct.ASPECTS 8.   CTA head &neck cervical R ICA occlusion2cm above origin w/ reconstitution and subsequent occlusion proximal R M1 extending into bifurcation   Cerebral angio / IR extracranial R ICA/terminus, R MCA occlusion w/ TICI3 revascularization.Possible R ICA dissection  Repeat CT head dense contrast staining posterior R lentiform (no hemorrhage). New/increased edema R basal ganglia, R insula, anterior temporal lobe w/ mild mass effect R lateral ventricle. 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  MRI right MCA multifocal infarcts  LE Dopplerno DVT  EEG- 10/19 - Continuousslow, generalized suggestive of severe diffuse encephalopathy, nonspecific etiology.No seizures or epileptiform discharges were seen throughout the recording.  2D EchoEF 60-65%  TEE No SOE, neg bubble  TG823, direct LDL 151   HgbA1c- 5.6  UDS - negative  Hypercoagulable labs only ACL IgM 16 slightly elevated o/w negative VTE prophylaxis - Lovenox 40 mg sq daily   aspirin 81 mg dailyprior to admission, now on aspirin 81 and the Plavix 75 DAPT for 3 weeks and then Plavix alone.  Therapy recommendations: CIR   Disposition: CIR   Today, 10/17/2020, [name] is being seen for hospital follow-up assisted by interpreter.  He was discharged home from CIR on 07/22/2020 after a 10 day stay.  Doing well since discharge without new stroke/TIA symptoms and he has recovered well functionally since discharge but continues to struggle with anxiety and concerns of having additional strokes.  This has been slowly improving especially  since starting Celexa 10 mg nightly.  He is currently working at [employer] but is wondering if he would be able to return back to his prior job in [employer].  He has completed 3 weeks DAPT and remains on Plavix alone without bleeding or bruising.  Remains on atorvastatin 80 mg daily without myalgias.  Blood pressure today 126/94. Reports ongoing use of Keppra 500 mg twice daily tolerating well without any recurrent seizures.  He questions ongoing need of Keppra and possible return to driving.  He continues to refrain from tobacco and cocaine use.  He does admit to occasional alcohol use.  No further concerns at this time.     ROS:   14 system review of systems performed and negative with exception of anxiety  PMH:  Past Medical History:  Diagnosis Date  . 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 BUBBLE STUDY  07/06/2020   Procedure: BUBBLE STUDY;  Surgeon: Little Ishikawa, MD;  Location: [facility] ENDOSCOPY;  Service: Cardiovascular;;  . IR ANGIO INTRA EXTRACRAN SEL COM CAROTID INNOMINATE UNI L MOD SED  07/03/2020  . IR CT HEAD LTD  07/03/2020  . IR CT HEAD LTD  07/03/2020  . IR PERCUTANEOUS ART THROMBECTOMY/INFUSION INTRACRANIAL INC DIAG ANGIO  07/03/2020  . IR US GUIDE VASC ACCESS RIGHT  07/03/2020  . RADIOLOGY WITH ANESTHESIA N/A 07/03/2020   Procedure: IR WITH ANESTHESIA;  Surgeon: Radiologist, Medication, MD;  Location: [facility] OR;  Service: Radiology;  Laterality: N/A;  . TEE WITHOUT CARDIOVERSION N/A 07/06/2020   Procedure: TRANSESOPHAGEAL ECHOCARDIOGRAM (TEE);  Surgeon: Little Ishikawa, MD;  Location: [facility] ENDOSCOPY;  Service: Cardiovascular;  Laterality: N/A;    Social History:  Social History   Socioeconomic History  . Marital status: Single    Spouse name: [name]  . Number of children: 2  . Years of education: Not on file  . Highest education level: Not on file  Occupational History  . Not on  file  Tobacco Use  . Smoking status: Former Smoker    Types: Cigarettes  . Smokeless tobacco: Never Used  . Tobacco comment: quit after stroke in 2020  Substance and Sexual Activity  . Alcohol use: Yes    Alcohol/week: 3.0 standard drinks    Types: 3 Cans of beer per week    Comment: 20 ounce cans  . Drug use: Not on file  . Sexual activity: Not on file  Other Topics Concern  . Not on file  Social History Narrative   Lives with his partner of 8 years and has 2 kids with her. All of his family is in a different country.   Social Determinants of Health   Financial Resource Strain: Not on file  Food Insecurity: Not on file  Transportation Needs: Not on file  Physical Activity: Not on file  Stress: Not on file  Social Connections: Not on file  Intimate Partner Violence: Not on file    Family History:  Family History  Problem Relation Age of Onset  . Hypertension Mother   . Hypertension Father     Medications:   Current Outpatient Medications on File Prior to Visit  Medication Sig Dispense Refill  . atorvastatin (LIPITOR) 80 MG tablet  Take 1 tablet (80 mg total) by mouth daily. 30 tablet 3  . citalopram (CELEXA) 10 MG tablet Take 1 tablet (10 mg total) by mouth at bedtime. 30 tablet 2  . clopidogrel (PLAVIX) 75 MG tablet Take 1 tablet (75 mg total) by mouth daily. 30 tablet 3  . docusate sodium (COLACE) 100 MG capsule Take 1 capsule (100 mg total) by mouth 2 (two) times daily. 60 capsule 0  . levETIRAcetam (KEPPRA) 500 MG tablet Take 1 tablet (500 mg total) by mouth 2 (two) times daily. 120 tablet 3  . Multiple Vitamin (MULTIVITAMIN WITH MINERALS) TABS tablet Take 1 tablet by mouth daily. 30 tablet 0  . pantoprazole (PROTONIX) 40 MG tablet Take 1 tablet (40 mg total) by mouth daily at 12 noon. 30 tablet 3  . senna-docusate (SENOKOT-S) 8.6-50 MG tablet Take 1 tablet by mouth 2 (two) times daily.     No current facility-administered medications on file prior to visit.     Allergies:  No Known Allergies    OBJECTIVE:  Physical Exam  Vitals:   10/17/20 1104  BP: 126/74  Pulse: 78  Weight: 181 lb (82.1 kg)  Height: 5\' 3"  (1.6 m)   Body mass index is 32.06 kg/m. No exam data present  General: well developed, well nourished,  pleasant middle-age Hispanic male, seated, mildly anxious and occasionally tearful throughout visit Head: head normocephalic and atraumatic.   Neck: supple with no carotid or supraclavicular bruits Cardiovascular: regular rate and rhythm, no murmurs Musculoskeletal: no deformity Skin:  no rash/petichiae Vascular:  Normal pulses all extremities   Neurologic Exam Mental Status: Awake and fully alert.   Primarily Spanish-speaking but denies dysarthria or aphasia.  Oriented to place and time. Recent and remote memory intact. Attention span, concentration and fund of knowledge appropriate. Mood and affect appropriate for situation.  Cranial Nerves: Fundoscopic exam reveals sharp disc margins. Pupils equal, briskly reactive to light. Extraocular movements full without nystagmus. Visual fields full to confrontation. Hearing intact. Facial sensation intact. Face, tongue, palate moves normally and symmetrically.  Motor: Normal bulk and tone. Normal strength in all tested extremity muscles except slightly decreased left dexterity  Sensory.: intact to touch , pinprick , position and vibratory sensation.  Coordination: Rapid alternating movements normal in all extremities except slightly decreased left hand dexterity. Finger-to-nose and heel-to-shin performed accurately bilaterally.  Mildly orbits right arm over left arm. Gait and Station: Arises from chair without difficulty. Stance is normal. Gait demonstrates normal stride length and balance without use of assistive device.  Able to tandem walk and heel toe without difficulty. Reflexes: 1+ and symmetric. Toes downgoing.     NIHSS  0 Modified Rankin  1      ASSESSMENT: Leam Madero is a 35 y.o. year old male presented with slurred speech and numbness followed by collapse and possible seizure on 07/03/2020 with evidence of left-sided weakness w/ neglect and right gaze preference upon ED arrival with stroke work-up revealing right MCA infarct due to right ICA and MCA occlusion s/p tPA and IR with TICI three revascularization, embolic secondary to unknown source vs possible right ICA dissection. Vascular risk factors include hx of cerebellar stroke 05/2019, hx of tobacco, EtOH and cocaine use (possible etiology of prior stroke), HLD and obesity.      PLAN:  1. R MCA stroke :  a. Residual deficit: Slight decrease left hand dexterity and anxiety.  Encourage slowly returning back to prior activities as this will likely greatly help his anxiety.  Continue Celexa 10 mg daily.  Discussed possible benefit of counseling but he declines at this time.  In regards to return back to prior work in [employer], he was advised to hold off until repeat CTA completed due to possible dissection b. Continue clopidogrel 75 mg daily  and atorvastatin 80 mg daily for secondary stroke prevention.   c. Discussed secondary stroke prevention measures and importance of close PCP follow up for aggressive stroke risk factor management  2. Seizures with stroke onset: No additional seizure events.  Repeat EEG.  Continue Keppra 500 mg twice daily for seizure prophylaxis.  Discussed [place] law which is no driving for at least 6 months post seizure activity 3. ?R ICA dissection: Questionable dissection per cerebral angiogram.  Repeat CTA head to further assess for resolution 4. HLD: LDL goal <70. Recent direct LDL 151 and initiated atorvastatin 80 mg daily during recent stroke admission.  He does not currently have PCP -repeat lipid panel today 5. Referral will be placed to establish care with PCP    Follow up in 4 months or call earlier if needed   CC:  [facility] provider: Dr. Pearlean Brownie   I spent 50 minutes of  face-to-face and non-face-to-face time with patient assisted by interpreter.  This included previsit chart review including recent hospitalization pertinent progress notes, lab work and imaging, lab review, study review, order entry, electronic health record documentation, patient education regarding recent stroke and possible etiology, residual deficits, importance of managing stroke risk factors and answered all other multiple questions to patient satisfaction  Ihor Austin, AGNP-BC  [facility] [address] [place], [place] [address]  Phone [phone] Fax [phone] Note: This document was prepared with digital dictation and possible smart phrase technology. Any transcriptional errors that result from this process are unintentional.

## 2020-10-17 NOTE — Telephone Encounter (Signed)
self pay order sent to GI. They will reach out to the patient to schedule.  °

## 2020-10-17 NOTE — Patient Instructions (Addendum)
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 I hope we have been able to provide you high quality care today.  You may receive a patient satisfaction survey over the next few weeks. We would appreciate your feedback and comments so that we may continue to improve ourselves and the health of our patients.    Emotional Health After Stroke Emotional Health After Stroke Es posible que su salud emocional cambie si ha tenido un accidente cerebrovascular. Puede sentir temor, ansiedad, ira, tristeza y otros sentimientos. Algunos de [place] se producen porque el accidente cerebrovascular puede daar el cerebro y el sistema nervioso. Tambin puede sentirse as porque lidiar con un cambio en su salud puede ser [facility]. La depresin y otros cambios emocionales pueden lentificar su recuperacin tras un accidente cerebrovascular. Es importante que reconozca los sntomas para poder tomar medidas destinadas a fortalecer su salud emocional. Cules son algunas de las emociones frecuentes tras un accidente cerebrovascular? Puede sentir lo siguiente:  Temor.  Ansiedad.  [employer].  Frustracin.  [place].  Sentimientos de prdida o afliccin.  Depresin.  Llorar o rerse en el momento incorrecto o en la situacin incorrecta (incontinencia afectiva). Cules son los sntomas de la depresin? La depresin tras un  accidente cerebrovascular puede ocurrir de inmediato o [employer]. Los sntomas de depresin pueden incluir, entre otros, los siguientes:  Problemas para dormir.  Cambios en los hbitos alimentarios normales, como comer demasiado o demasiado poco.  Aumento o prdida de [place].  Falta de energa o entusiasmo (letargo).  Sentirse muy cansado (fatiga).  Glass blower/designer y [place] (aislamiento social).  Irritabilidad.  Llorar ms de lo habitual.  Cambios en el estado de nimo.  Incapacidad para concentrarse.  [place].  Sentimientos de odio contra s mismo.  Pensamientos suicidas. Si alguna vez siente que puede lastimarse o Veterinary surgeon a los dems, o tiene pensamientos de poner fin a su vida, busque ayuda de inmediato. Puede dirigirse al departamento de emergencias ms cercano o comunicarse con:  Su servicio de Pharmacologist (911 en los [place]).  Una lnea de asistencia al suicida y Physicist, medical en crisis, como la Marine scientist de Prevencin del Suicidio (National Suicide Prevention Lifeline) al [phone]. Est disponible las 24 horas del da. Qu factores aumentan el riesgo de tener depresin? [employer] tenido un accidente cerebrovascular aumenta el riesgo de tener depresin. El riesgo tambin se incrementa si usted:  Est aislado socialmente.  Tiene antecedentes familiares de depresin.  Tiene antecedentes de depresin u otros problemas de salud mental previos al accidente cerebrovascular.  No puede trabajar o [phone] de las que antes disfrutaba.  Necesita Leanor Rubenstein para las actividades cotidianas.  Consume drogas o bebe alcohol.  Toma ciertos medicamentos, como pastillas para dormir o medicamentos para la hipertensin arterial. Cules son algunos mtodos que puedo [employer] para sobrellevar la situacin? Pida ayuda a su mdico. El  mdico podra recomendarle tratamientos para la depresin [employer], [place]  otros:  Psicoterapia o asesoramientopsicolgico con un profesional de salud mental. Esto puede incluir terapia cognitivo conductual (TCC) para ayudar a Multimedia programmer sus patrones de pensamiento.  Tratamientos mdicos, como estimulacin cerebral o fototerapia.  Cambios en el estilo de vida, como seguir una dieta saludable y evitar el alcohol.  Antidepresivos.  Tratamientos alternativos, como acupuntura, musicoterapia o terapia con [place]. Algunas otras estrategias para sobrellevar la situacin que podra probar son las siguientes:  Hacer fisioterapia o ejercicios. Trate de IT sales professional de ejercicio [employer].  Escribir sus pensamientos en un diario. Un ejemplo podra ser llevar un registro de los pensamientos no realistas o de las cosas por las que est agradecido.  Seguir buenos hbitos de sueo, por ejemplo, levantarse a la [employer].  Seguir una rutina [employer].  Participar en actividades que lo hagan rer.  Hacer terapia basada en la plena conciencia. Esto puede incluir meditacin y otras tcnicas para reducir Development worker, community.  Unirse a un grupo de apoyo para personas que se estn recuperando de un accidente cerebrovascular. Estos grupos ofrecen interaccin social y lo ayudan a sentirse conectado a Economist. Su equipo de atencin mdica puede ayudarlo a Clinical research associate un grupo de apoyo en su zona.   Resumen  Es posible que su salud emocional cambie si ha tenido un accidente cerebrovascular. Puede sentir temor, ansiedad, ira, tristeza y otros sentimientos.  Es importante que reconozca los sntomas de la depresin y otros problemas emocionales.  Si tiene [employer], hgaselo saber a sus seres queridos y comunquese con su mdico. Esta informacin no tiene Building services engineer consejo del mdico. Asegrese de hacerle al mdico cualquier pregunta que tenga. Document Revised: 01/03/2020 Document Reviewed: 04/28/2017 Elsevier Patient Education   2021 Elsevier Inc.  Prevencin del accidente cerebrovascular Stroke Prevention Algunas afecciones mdicas y elecciones en el estilo de vida pueden conducir a un mayor riesgo de Warehouse manager un accidente cerebrovascular. Puede ayudar a prevenir un accidente cerebrovascular comiendo alimentos saludables y haciendo ejercicio. Tambin ayuda no fumar y Pension scheme manager de salud que usted pueda [place]. Cmo puede afectarme esta enfermedad? El accidente cerebrovascular es Radio broadcast assistant. Se debe tratar de inmediato. Un accidente cerebrovascular puede provocar dao cerebral o poner en riesgo su vida. Hay una mayor probabilidad de sobrevivir y mejorar despus de un accidente cerebrovascular si obtiene ayuda mdica de inmediato. Qu puede aumentar el riesgo? Las siguientes enfermedades pueden aumentar el riesgo de esta afeccin:  Enfermedades del corazn y de los vasos sanguneos (enfermedad cardiovascular).  Presin arterial alta (hipertensin).  Diabetes.  Colesterol alto.  Anemia drepanoctica.  Problemas de coagulacin.  Tener mucho sobrepeso.  Problemas de sueo (apnea obstructivadel sueo). Otros factores de riesgo son los siguientes:  Ser mayor de 60aos de edad.  Antecedentes de cogulos sanguneos, accidente cerebrovascular o miniaccidente cerebrovascular (accidente isqumico transitorio, AIT).  Raza, origen tnico o antecedentes familiares de accidente cerebrovascular.  Fumar o usar productos que contengan tabaco.  Tomar pldoras anticonceptivas, especialmente si fuma.  Consumo excesivo de alcohol y drogas.  Ser sedentario. Qu medidas de prevencin puedo tomar? Controle su estado de salud  Colesterol alto. ? Siga una dieta saludable. Si esto no es suficiente para controlar su colesterol, es posible que tenga que tomar medicamentos. ? [employer] indic el mdico.  Presin arterial alta. ? Intente mantener su presin arterial por debajo de  130/80. ? Si su presin  arterial no puede controlarse mediante una dieta saludable y ejercicio habitual, es posible que deba tomar medicamentos. ? [employer] indic el mdico. ? Pregntele al mdico si debera controlar su presin arterial en su casa. ? Hgase controlar la presin arterial todos los aos.  Diabetes. ? Siga una dieta saludable y haga ejercicio con regularidad. Si su nivel de azcar en la sangre (glucosa) no puede controlarse mediante dieta y ejercicio, es posible que deba tomar medicamentos. ? [employer] indic el mdico.  Consulte a su mdico sobre la necesidad de una evaluacin para determinar si tiene problemas de sueo. Los signos de un problema pueden incluir los siguientes: ? Education administrator. ? Mucho cansancio.  Asegrese de controlar cualquier otra afeccin mdica que tenga. Nutricin  Siga las instrucciones del mdico respecto de las comidas o las bebidas. Es posible que le indiquen lo siguiente: ? Comer y beber menos Corporate treasurer. ? Limitar la cantidad de sal (sodio) que [place] a (mg) por Futures trader. ? Usar solo aceites saludables para Water quality scientist, como aceite de [place], aceite de canola y aceite de [place]. ? Consumir alimentos saludables. Para hacer esto:  Elija alimentos que sean ricos en fibra. Entre ellos, se incluyen los cereales integrales y las frutas y verduras frescas.  Coma al menos 5 porciones de frutas y verduras Management consultant. Trate de llenar la mitad del plato de cada comida con frutas y verduras.  Elija protenas con bajo contenido de grasa [place]). Estas incluyen cortes de carne bajos en grasa, pollo sin piel, pescado, tofu, frijoles y frutos secos.  Consuma productos lcteos descremados. ? Evite alimentos que:  Tengan mucha sal.  Tengan grasas saturadas.  Tengan grasas trans.  Tengan colesterol.  Sean procesados o preelaborados. ? Cuente cuntos carbohidratos come y Investment banker, operational.   Estilo de  vida  Si bebe alcohol: ? Limite la cantidad que bebe a lo siguiente:  De 0 a 1 medida por da para las mujeres que no estn embarazadas.  De 0 a 2 medidas por da para los hombres. ? Sepa cunta cantidad de alcohol hay en las bebidas que toma. En los [place], una medida equivale a una botella de cerveza de 12oz ( ), un vaso de vino de 5oz ( ) o un vaso de una bebida alcohlica de alta graduacin de 1oz (75ml).  No fume ni consuma productos que contengan nicotina ni tabaco. Si necesita ayuda para dejar de fumar, consulte al mdico.  Evite ser fumador pasivo.  No consuma drogas. Actividad  Intente mantener un peso saludable.  Trate de hacer al menos de ejercicio casi todos [place], por ejemplo: ? Caminar rpidamente. ? Andar en bicicleta. ? Natacin.   Medicamentos  Use los medicamentos de venta libre y los recetados solamente como se lo haya indicado el mdico.  Evite tomar pldoras anticonceptivas. Hable con su mdico acerca de los riesgos de tomar pldoras anticonceptivas si: ? Es mayor de 35aos. ? Fuma. ? Tiene dolores de [place] muy intensos. ? Alguna vez ha tenido un cogulo de [place]. Dnde buscar ms informacin  American Stroke Association (Asociacin Americana de Accidente Cerebrovascular): www.strokeassociation.org Solicite ayuda de inmediato si:  Usted o un ser querido tiene algn signo de un accidente cerebrovascular. "BE FAST" es una manera fcil de recordar las seales de advertencia: ? B: Balance (equilibrio). Mareo, dificultad repentina para caminar o prdida del equilibrio. ? E: Eyes (ojos). Dificultad para ver o un cambio en cmo ve. ? F:  Face (rostro). Debilidad repentina o prdida de la sensibilidad en la cara. Su rostro o prpado pueden caerse hacia un lado. ? A: Arms (brazos). Debilidad o prdida de la sensibilidad en un brazo. Esto sucede de repente y con mayor frecuencia en un lado del cuerpo. ? S: Speech (habla).  Dificultad repentina para hablar, hablar arrastrando las palabras o dificultad para comprender lo que las [employer]. ? T: Time (tiempo). Es tiempo de llamar al servicio de Sports administrator. Anote la hora a la que [employer] sntomas.  Usted o un ser querido tiene otros signos de un accidente cerebrovascular, como: ? Dolor de [place] repentino y muy intenso sin causa aparente. ? Ganas de vomitar (nuseas). ? Vmitos. ? Una convulsin. Estos sntomas pueden Customer service manager. Solicite ayuda de inmediato. Comunquese con el servicio de emergencias de su localidad (911 en los [place]).  No espere a ver si los sntomas desaparecen.  No conduzca por sus propios medios [employer] hospital. Resumen  Puede ayudar a prevenir un accidente cerebrovascular comiendo en forma saludable, haciendo ejercicio y no fumando. Tambin ayuda controlar cualquier problema de salud que usted pueda [place].  No fume ni consuma ningn producto que contenga nicotina o tabaco.  Obtenga ayuda de inmediato si usted o un ser querido presenta algn signo de un accidente cerebrovascular. Esta informacin no tiene Theme park manager el consejo del mdico. Asegrese de hacerle al mdico cualquier pregunta que tenga. Document Revised: 05/23/2020 Document Reviewed: 05/23/2020 Elsevier Patient Education  2021 [employer].

## 2020-10-17 NOTE — Progress Notes (Signed)
I agree with the above plan 

## 2020-10-18 ENCOUNTER — Telehealth: Payer: Self-pay | Admitting: Adult Health

## 2020-10-18 LAB — LIPID PANEL
Chol/HDL Ratio: 3.6 ratio (ref 0.0–5.0)
Cholesterol, Total: 156 mg/dL (ref 100–199)
HDL: 43 mg/dL (ref >39)
LDL Chol Calc (NIH): 91 mg/dL (ref 0–99)
Triglycerides: 120 mg/dL (ref 0–149)
VLDL Cholesterol Cal: 22 mg/dL (ref 5–40)

## 2020-10-18 NOTE — Telephone Encounter (Signed)
That is fine.   Thank you

## 2020-10-18 NOTE — Telephone Encounter (Signed)
Mark Oneal , I have sent this to [facility] to see if they can help with PCP while patient's insurance insurance is still pending is this ok ?

## 2020-10-31 ENCOUNTER — Other Ambulatory Visit: Payer: Self-pay

## 2020-11-07 ENCOUNTER — Ambulatory Visit
Admission: RE | Admit: 2020-11-07 | Discharge: 2020-11-07 | Disposition: A | Discharge status: Home / Self Care | Payer: Self-pay | Source: Ambulatory Visit | Attending: Adult Health | Admitting: Adult Health

## 2020-11-07 ENCOUNTER — Other Ambulatory Visit: Payer: Self-pay

## 2020-11-07 MED ORDER — IOPAMIDOL (ISOVUE-370) INJECTION 76%
75.0000 mL | Freq: Once | INTRAVENOUS | Status: AC | PRN
  Administered 2020-11-07: 75 mL via INTRAVENOUS

## 2020-11-09 ENCOUNTER — Encounter: Payer: Self-pay | Admitting: *Deleted

## 2020-11-09 ENCOUNTER — Telehealth: Payer: Self-pay | Admitting: *Deleted

## 2020-11-09 NOTE — Telephone Encounter (Signed)
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

## 2020-11-09 NOTE — Telephone Encounter (Signed)
Mailed result letter

## 2020-11-17 ENCOUNTER — Other Ambulatory Visit: Payer: Self-pay | Admitting: Physical Medicine & Rehabilitation

## 2020-11-17 DIAGNOSIS — F329 Major depressive disorder, single episode, unspecified: Secondary | ICD-10-CM

## 2020-11-21 ENCOUNTER — Other Ambulatory Visit: Payer: Self-pay

## 2020-11-21 DIAGNOSIS — R569 Unspecified convulsions: Secondary | ICD-10-CM

## 2020-11-21 NOTE — Telephone Encounter (Signed)
Called pt using language interpreters, speaking [phone] Onalee Hua. I relayed that pt can resume normal activities, no restrictions. He relayed to pt and he verbalized understanding. [phone].

## 2020-11-21 NOTE — Telephone Encounter (Signed)
Since dissection resolved, no further restrictions from a stroke standpoint

## 2020-11-21 NOTE — Telephone Encounter (Signed)
Pt here for EEG.  I relayed CTA results.  Complete resolution of carotid dissection.  GREAT. Asking question if ok lifting 45-50#??  Will let him know [phone] need interpreter spanish.,  No letter needed for owrk.

## 2020-11-22 ENCOUNTER — Telehealth: Payer: Self-pay | Admitting: *Deleted

## 2020-11-22 ENCOUNTER — Other Ambulatory Visit: Payer: Self-pay | Admitting: Neurology

## 2020-11-22 DIAGNOSIS — I639 Cerebral infarction, unspecified: Secondary | ICD-10-CM

## 2020-11-22 NOTE — Telephone Encounter (Signed)
Called pacific interpreters, Spanish interpreter Toniann Fail [phone], requested she LVM if no answer, however VMB was full.

## 2020-11-23 NOTE — Telephone Encounter (Signed)
Called pacific interpreters, [place], [phone], LVM informing patient that recent EEG was normal without evidence of seizure activity. I would highly encourage continued use of Keppra as there could possibly be increased risk of seizures for seizure prophylaxis. We can discuss decreasing dose if he is interested at follow-up visit. He left office # for questions.

## 2020-11-30 ENCOUNTER — Other Ambulatory Visit: Payer: Self-pay | Admitting: Physical Medicine & Rehabilitation

## 2020-11-30 DIAGNOSIS — R569 Unspecified convulsions: Secondary | ICD-10-CM

## 2020-11-30 DIAGNOSIS — I63411 Cerebral infarction due to embolism of right middle cerebral artery: Secondary | ICD-10-CM

## 2020-12-19 ENCOUNTER — Telehealth: Payer: Self-pay | Admitting: Adult Health

## 2020-12-19 ENCOUNTER — Other Ambulatory Visit: Payer: Self-pay

## 2020-12-19 ENCOUNTER — Emergency Department
Admission: EM | Admit: 2020-12-19 | Discharge: 2020-12-19 | Disposition: A | Discharge status: Home / Self Care | Payer: Self-pay | Attending: Emergency Medicine | Admitting: Emergency Medicine

## 2020-12-19 DIAGNOSIS — Y908 Blood alcohol level of 240 mg/100 ml or more: Secondary | ICD-10-CM | POA: Insufficient documentation

## 2020-12-19 DIAGNOSIS — F1010 Alcohol abuse, uncomplicated: Secondary | ICD-10-CM | POA: Insufficient documentation

## 2020-12-19 DIAGNOSIS — Z7902 Long term (current) use of antithrombotics/antiplatelets: Secondary | ICD-10-CM | POA: Insufficient documentation

## 2020-12-19 DIAGNOSIS — Z87891 Personal history of nicotine dependence: Secondary | ICD-10-CM | POA: Insufficient documentation

## 2020-12-19 LAB — CBC WITH DIFFERENTIAL/PLATELET
Abs Immature Granulocytes: 0.08 10*3/uL — ABNORMAL HIGH (ref 0.00–0.07)
Basophils Absolute: 0.1 10*3/uL (ref 0.0–0.1)
Basophils Relative: 1 %
Eosinophils Absolute: 0.1 10*3/uL (ref 0.0–0.5)
Eosinophils Relative: 1 %
HCT: 47.5 % (ref 39.0–52.0)
Hemoglobin: 16.7 g/dL (ref 13.0–17.0)
Immature Granulocytes: 1 %
Lymphocytes Relative: 40 %
Lymphs Abs: 3.5 10*3/uL (ref 0.7–4.0)
MCH: 30.9 pg (ref 26.0–34.0)
MCHC: 35.2 g/dL (ref 30.0–36.0)
MCV: 88 fL (ref 80.0–100.0)
Monocytes Absolute: 0.7 10*3/uL (ref 0.1–1.0)
Monocytes Relative: 8 %
Neutro Abs: 4.4 10*3/uL (ref 1.7–7.7)
Neutrophils Relative %: 49 %
Platelets: 221 10*3/uL (ref 150–400)
RBC: 5.4 MIL/uL (ref 4.22–5.81)
RDW: 12.3 % (ref 11.5–15.5)
WBC: 8.8 10*3/uL (ref 4.0–10.5)
nRBC: 0 % (ref 0.0–0.2)

## 2020-12-19 LAB — COMPREHENSIVE METABOLIC PANEL
ALT: 59 U/L — ABNORMAL HIGH (ref 0–44)
AST: 115 U/L — ABNORMAL HIGH (ref 15–41)
Albumin: 5.1 g/dL — ABNORMAL HIGH (ref 3.5–5.0)
Alkaline Phosphatase: 121 U/L (ref 38–126)
Anion gap: 12 (ref 5–15)
BUN: 8 mg/dL (ref 6–20)
CO2: 29 mmol/L (ref 22–32)
Calcium: 9.2 mg/dL (ref 8.9–10.3)
Chloride: 98 mmol/L (ref 98–111)
Creatinine, Ser: 0.91 mg/dL (ref 0.61–1.24)
GFR, Estimated: >60 mL/min (ref >60)
Glucose, Bld: 106 mg/dL — ABNORMAL HIGH (ref 70–99)
Potassium: 3.7 mmol/L (ref 3.5–5.1)
Sodium: 139 mmol/L (ref 135–145)
Total Bilirubin: 0.9 mg/dL (ref 0.3–1.2)
Total Protein: 8.5 g/dL — ABNORMAL HIGH (ref 6.5–8.1)

## 2020-12-19 LAB — MAGNESIUM: Magnesium: 2.3 mg/dL (ref 1.7–2.4)

## 2020-12-19 LAB — ETHANOL: Alcohol, Ethyl (B): 277 mg/dL — ABNORMAL HIGH (ref <10)

## 2020-12-19 LAB — SALICYLATE LEVEL: Salicylate Lvl: <7 mg/dL — ABNORMAL LOW (ref 7.0–30.0)

## 2020-12-19 LAB — ACETAMINOPHEN LEVEL: Acetaminophen (Tylenol), Serum: <10 ug/mL — ABNORMAL LOW (ref 10–30)

## 2020-12-19 MED ORDER — ADULT MULTIVITAMIN W/MINERALS CH
1.0000 | ORAL_TABLET | Freq: Once | ORAL | Status: AC
  Administered 2020-12-19: 1 via ORAL
  Filled 2020-12-19: qty 1

## 2020-12-19 MED ORDER — THIAMINE HCL 100 MG PO TABS
100.0000 mg | ORAL_TABLET | Freq: Once | ORAL | Status: AC
  Administered 2020-12-19: 100 mg via ORAL
  Filled 2020-12-19: qty 1

## 2020-12-19 MED ORDER — LACTATED RINGERS IV BOLUS
1000.0000 mL | Freq: Once | INTRAVENOUS | Status: AC
  Administered 2020-12-19: 1000 mL via INTRAVENOUS

## 2020-12-19 NOTE — ED Notes (Signed)
Raquel, RN with unsuccessful IV attempt x1

## 2020-12-19 NOTE — Telephone Encounter (Signed)
Pt called wanting to know if the provider has anywhere she can suggest for the pt to get help with his depression. Wife Kathlene November states that he is quite depressed constantly crying and is drinking heavly and mixing it with medication. Please advise. I spoke to the pt in Spanish and will be needing a translator for the call back. She is willing to drive down to [facility] to pick up any information if needed.

## 2020-12-19 NOTE — ED Notes (Signed)
Pt ambulated out of ED with steady, unassisted gait. Pt accompanied out to pt's son Steven's vehicle. Discharge instructions reviewed with pt and pt's son, both verbalized understanding on steps for follow up care. Pt denied any pain, further needs, or SI at time of discharge.

## 2020-12-19 NOTE — ED Provider Notes (Signed)
[facility] Emergency Department Provider Note ____________________________________________   Event Date/Time   First MD Initiated Contact with Patient 12/19/20 1924     (approximate)  I have reviewed the triage vital signs and the nursing notes.  HISTORY  Chief Complaint Alcohol Intoxication   HPI Mark Oneal is a 35 y.o. malewho presents to the ED for evaluation of alcohol intoxication.   Chart review indicates hx stroke on Plavix, seizure disorder on Keppra.  Depression, HLD and GERD. 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  Status post TPA and IR revascularization..  Patient presents to the ED intoxicated and requesting assistance with his ethanol habit.  He reports drinking regularly due to the stress and anxiety of having had stroke at such young age.  He reports it helps him, but he knows that it is not a good coping mechanism.  He denies any suicidal intent, plans.  Denies homicidality or hallucinations.  He reports he has never been to rehab.  He denies coingestions or additional recreational drugs beyond ethanol.  Denies recent illnesses, fevers, syncopal episodes, falls or injury.   Past Medical History:  Diagnosis Date  . History of stroke involving cerebellum 05/2019    Patient Active Problem List   Diagnosis Date Noted  . 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 Elevated liver function tests 08/02/2020  . Hemi-inattention--left 07/21/2020  . Acute ischemic right MCA stroke (HCC) 07/12/2020  . Dysphagia, post-stroke   . Aspiration pneumonia (HCC)   . Seizures (HCC) 07/07/2020  . Hyperlipidemia LDL goal <70 07/07/2020  . 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 Obesity 07/07/2020  . Leukocytosis 07/07/2020  . History of CVA with residual deficit   . Cerebral infarction due to embolism of R ICA, R MCA vs dissection (HCC) s/p IR w/ revascularization 07/03/2020  . Middle cerebral artery embolism, right  07/03/2020  . Encephalopathy acute     Past Surgical History:  Procedure Laterality Date  . BUBBLE STUDY  07/06/2020   Procedure: BUBBLE STUDY;  Surgeon: Little Ishikawa, MD;  Location: [facility] ENDOSCOPY;  Service: Cardiovascular;;  . IR ANGIO INTRA EXTRACRAN SEL COM CAROTID INNOMINATE UNI L MOD SED  07/03/2020  . IR CT HEAD LTD  07/03/2020  . IR CT HEAD LTD  07/03/2020  . IR PERCUTANEOUS ART THROMBECTOMY/INFUSION INTRACRANIAL INC DIAG ANGIO  07/03/2020  . IR US GUIDE VASC ACCESS RIGHT  07/03/2020  . RADIOLOGY WITH ANESTHESIA N/A 07/03/2020   Procedure: IR WITH ANESTHESIA;  Surgeon: Radiologist, Medication, MD;  Location: [facility] OR;  Service: Radiology;  Laterality: N/A;  . TEE WITHOUT CARDIOVERSION N/A 07/06/2020   Procedure: TRANSESOPHAGEAL ECHOCARDIOGRAM (TEE);  Surgeon: Little Ishikawa, MD;  Location: [facility] ENDOSCOPY;  Service: Cardiovascular;  Laterality: N/A;    Prior to Admission medications   Medication Sig Start Date End Date Taking? Authorizing Provider  atorvastatin (LIPITOR) 80 MG tablet TOME UNA TABLETA TODOS LOS DIAS 12/01/20   Ranelle Oyster, MD  citalopram (CELEXA) 10 MG tablet TAKE 1 TABLET BY MOUTH EVERYDAY AT BEDTIME 11/17/20   Ranelle Oyster, MD  clopidogrel (PLAVIX) 75 MG tablet TOME UNA TABLETA TODOS LOS DIAS 12/01/20   Ranelle Oyster, MD  docusate sodium (COLACE) 100 MG capsule TAKE 1 CAPSULE (100 MG TOTAL) BY MOUTH 2 (TWO) TIMES DAILY. 07/21/20 07/21/21  Love, Evlyn Kanner, PA-C  levETIRAcetam (KEPPRA) 500 MG tablet Take 1 tablet (500 mg total) by mouth 2 (two) times daily. 08/02/20   Ranelle Oyster,  MD  Multiple Vitamin (MULTIVITAMIN WITH MINERALS) TABS tablet Take 1 tablet by mouth daily. 07/22/20   Love, Evlyn KannerPamela S, PA-C  pantoprazole (PROTONIX) 40 MG tablet TAKE 1 TABLET (40 MG TOTAL) BY MOUTH DAILY AT 12 NOON. 12/01/20   Ranelle OysterSwartz, Zachary T, MD  senna-docusate (SENOKOT-S) 8.6-50 MG tablet Take 1 tablet by mouth 2 (two) times daily. 07/12/20   Layne BentonBiby, Sharon L,  NP    Allergies Patient has no known allergies.  Family History  Problem Relation Age of Onset  . Hypertension Mother   . Hypertension Father     Social History Social History   Tobacco Use  . Smoking status: Former Smoker    Types: Cigarettes  . Smokeless tobacco: Never Used  . Tobacco comment: quit after stroke in 2020  Substance Use Topics  . Alcohol use: Yes    Alcohol/week: 3.0 standard drinks    Types: 3 Cans of beer per week    Comment: 20 ounce cans  . Drug use: Not Currently    Review of Systems  Constitutional: No fever/chills Eyes: No visual changes. ENT: No sore throat. Cardiovascular: Denies chest pain. Respiratory: Denies shortness of breath. Gastrointestinal: No abdominal pain.  No nausea, no vomiting.  No diarrhea.  No constipation. Genitourinary: Negative for dysuria. Musculoskeletal: Negative for back pain. Skin: Negative for rash. Neurological: Negative for headaches, focal weakness or numbness. Psychiatric: Positive for depression and ethanol abuse ____________________________________________   PHYSICAL EXAM:  VITAL SIGNS: Vitals:   12/19/20 1919 12/19/20 2112  BP: (!) 138/103 127/74  Pulse: 97 71  Resp: 18 18  Temp: 98.4 F (36.9 C)   SpO2: 96% 100%    Constitutional: Alert and oriented. Well appearing and in no acute distress. Initially clinically intoxicated with slight dysarthria.  Pleasant and benign Eyes: Conjunctivae are normal. PERRL. EOMI. Head: Atraumatic. Nose: No congestion/rhinnorhea. Mouth/Throat: Mucous membranes are moist.  Oropharynx non-erythematous. Neck: No stridor. No cervical spine tenderness to palpation. Cardiovascular: Normal rate, regular rhythm. Grossly normal heart sounds.  Good peripheral circulation. Respiratory: Normal respiratory effort.  No retractions. Lungs CTAB. Gastrointestinal: Soft , nondistended, nontender to palpation. No CVA tenderness. Musculoskeletal: No lower extremity tenderness nor  edema.  No joint effusions. No signs of acute trauma. Neurologic:  Normal speech and language. No gross focal neurologic deficits are appreciated.  Cranial nerves II through XII intact 5/5 strength and sensation in all 4 extremities Skin:  Skin is warm, dry and intact. No rash noted. Psychiatric: Mood and affect are normal. Speech and behavior are normal. ____________________________________________   LABS (all labs ordered are listed, but only abnormal results are displayed)  Labs Reviewed  COMPREHENSIVE METABOLIC PANEL - Abnormal; Notable for the following components:      Result Value   Glucose, Bld 106 (*)    Total Protein 8.5 (*)    Albumin 5.1 (*)    AST 115 (*)    ALT 59 (*)    All other components within normal limits  CBC WITH DIFFERENTIAL/PLATELET - Abnormal; Notable for the following components:   Abs Immature Granulocytes 0.08 (*)    All other components within normal limits  ETHANOL - Abnormal; Notable for the following components:   Alcohol, Ethyl (B) 277 (*)    All other components within normal limits  ACETAMINOPHEN LEVEL - Abnormal; Notable for the following components:   Acetaminophen (Tylenol), Serum <10 (*)    All other components within normal limits  SALICYLATE LEVEL - Abnormal; Notable for the following components:  Salicylate Lvl <7.0 (*)    All other components within normal limits  MAGNESIUM  URINE DRUG SCREEN, QUALITATIVE ([facility] ONLY)   ____________________________________________  12 Lead EKG  Rhythm, rate of 68 bpm.  Normal axis and intervals.  No evidence of acute ischemia. ____________________________________________   PROCEDURES and INTERVENTIONS  Procedure(s) performed (including Critical Care):  .1-3 Lead EKG Interpretation Performed by: Delton Prairie, MD Authorized by: Delton Prairie, MD     Interpretation: normal     ECG rate:  70   ECG rate assessment: normal     Rhythm: sinus rhythm     Ectopy: none     Conduction: normal    Ultrasound ED Peripheral IV (Provider)  Date/Time: 12/19/2020 9:53 PM Performed by: Delton Prairie, MD Authorized by: Delton Prairie, MD   Procedure details:    Indications: hydration, multiple failed IV attempts and poor IV access     Skin Prep: chlorhexidine gluconate     Location: Right basilic vein.   Angiocath:  20 G   Bedside Ultrasound Guided: Yes     Images: not archived     Patient tolerated procedure without complications: Yes     Dressing applied: Yes      Medications  thiamine tablet 100 mg (100 mg Oral Given 12/19/20 2005)  multivitamin with minerals tablet 1 tablet (1 tablet Oral Given 12/19/20 2005)  lactated ringers bolus 1,000 mL (0 mLs Intravenous Stopped 12/19/20 2119)    ____________________________________________   MDM / ED COURSE   35 year old male presents to the ED requesting assistance with his ethanol use, and ultimately demanding discharge after a benign observation period.  Normal vitals on room air.  Exam initially with slight clinical intoxication with mild dysarthria and gait instability upon arrival.  Nonfocal exam without evidence of neurovascular deficits, trauma or any distress.  He presents voluntarily requesting assistance with his alcoholism, but quickly indicates that he wants to leave and go home to care for his family.  I convince him to stay for couple hours for an observation period and some IV fluids.  He ends up staying for a couple hours, achieving clinical sobriety and persistently requesting discharge.  He continues to indicate that he has no suicidal intent and I see no indication for IVC or emergent psychiatric evaluation.  He has good familial support at home and seems motivated to care for his family.  I provide him with information to follow-up with [facility] and we discussed return precautions for the ED.  Patient stable for outpatient management.   Clinical Course as of 12/19/20 2255  Tue Dec 19, 2020  1930 Called to bedside by  nursing due to concern for facial asymmetry/droop in the setting of his history of stroke, and the concern for acute stroke.  I perform a neuro exam without evidence of deficit. [DS]  2005 USIV placed by me.  Patient is indicating that he wants to leave to go home.  He reports that he has no suicidality and wants to go home to take care of his family.  We discussed IV fluids and brief observation period.  He reports that he needs a ride home.  We offered to help find a ride with his wife, he is agreeable to IV fluids in the meantime. [DS]  2036 I speak with step-son. He's been drinking like crazy. He's different after the most recent stroke, he's a lot more anxious. Been drinking more since then.  [DS]  2126 Patient is requesting discharge.  I  return to the bedside.  Patient has finished 1 L IV fluids and has sharper mentation.  No longer dysarthric and appears clinically well.  He reports that he was to go home to care for his family.  He denies any suicidal thoughts, intents or plan.  We discussed following up with [facility] services and we discussed return precautions for the ED.  He expresses understanding and agreement.  We will call his Macie Burows ,back for a ride back home [DS]  2153 Patient ambulates out of the ED in no acute distress.  Normal gait.  Clinically sober. [DS]    Clinical Course User Index [DS] Delton Prairie, MD    ____________________________________________   FINAL CLINICAL IMPRESSION(S) / ED DIAGNOSES  Final diagnoses:  Alcohol abuse     ED Discharge Orders    None       Alysa Duca Katrinka Blazing   Note:  This document was prepared using Dragon voice recognition software and may include unintentional dictation errors.   Delton Prairie, MD 12/19/20 2308

## 2020-12-19 NOTE — Discharge Instructions (Signed)
Please call [facility] services tomorrow morning.  This is a great local resources for counseling and connection with substance abuse treatment, such as rehab for alcoholism.  If you develop any worsening depression, thoughts of hurting yourself, further uncontrolled drinking or seizure, please return to the ED.

## 2020-12-19 NOTE — Telephone Encounter (Signed)
I called and LMVM for wife of pt.  Mark Oneal language line ID [id number].

## 2020-12-19 NOTE — ED Notes (Signed)
Pt's step son Jeannett Senior [phone] spoke with Dr. Katrinka Blazing, states he will be able to pick up pt at time of discharge. Pt updated.

## 2020-12-19 NOTE — ED Notes (Signed)
Dr. Katrinka Blazing at bedside speaking with patient. Pt repeatedly requesting to go home to be with family.

## 2020-12-19 NOTE — ED Notes (Signed)
Unsuccessful IV attempt x2 by Erie Noe, RN and x1 by this RN

## 2020-12-19 NOTE — ED Triage Notes (Signed)
Pt is a 35 y/o m who presents via EMS with a cc of of alcohol intoxication. Pt reports heavy alcohol use x2 weeks.  Pt reports "I drink a lot help me help me". Pt states he is here to stop drinking. Unsteady gait noted. Denies SI/ HI/AVH. Denies drug use. Hx of CVA.

## 2020-12-19 NOTE — ED Notes (Signed)
Pt requesting to be discharged, denies SI. Reports he does not plan to drink once discharged home this evening, is very agreeable to be discharged with information on a rehab to follow up with. Pt's son Viviann Spare states he will be able to pick up the pt in approx 20 minutes

## 2020-12-19 NOTE — ED Triage Notes (Signed)
Patient to ER via [facility] from home for c/o "not acting right" per family. Patient has h/o CVA, has been drinking large amount of alcohol tonight. EMS reports several empty liquor bottles at residence. Vitals: 122/80, 88HR, 16R, 97% RA.

## 2020-12-19 NOTE — ED Notes (Signed)
Pt's son called, spoke with pt. Pt's son speaking with this RN and MD with pt's permission regarding plan for discharge/rehab.

## 2020-12-20 ENCOUNTER — Other Ambulatory Visit: Payer: Self-pay

## 2020-12-20 ENCOUNTER — Encounter: Payer: Self-pay | Admitting: Emergency Medicine

## 2020-12-20 ENCOUNTER — Emergency Department
Admission: EM | Admit: 2020-12-20 | Discharge: 2020-12-21 | Disposition: A | Discharge status: Other Acute Inpatient Hospital | Payer: Self-pay | Attending: Emergency Medicine | Admitting: Emergency Medicine

## 2020-12-20 DIAGNOSIS — R569 Unspecified convulsions: Secondary | ICD-10-CM

## 2020-12-20 DIAGNOSIS — F322 Major depressive disorder, single episode, severe without psychotic features: Secondary | ICD-10-CM

## 2020-12-20 DIAGNOSIS — R414 Neurologic neglect syndrome: Secondary | ICD-10-CM | POA: Diagnosis present

## 2020-12-20 DIAGNOSIS — Y907 Blood alcohol level of 200-239 mg/100 ml: Secondary | ICD-10-CM | POA: Insufficient documentation

## 2020-12-20 DIAGNOSIS — F10129 Alcohol abuse with intoxication, unspecified: Secondary | ICD-10-CM | POA: Insufficient documentation

## 2020-12-20 DIAGNOSIS — I6601 Occlusion and stenosis of right middle cerebral artery: Secondary | ICD-10-CM | POA: Diagnosis present

## 2020-12-20 DIAGNOSIS — J690 Pneumonitis due to inhalation of food and vomit: Secondary | ICD-10-CM | POA: Diagnosis present

## 2020-12-20 DIAGNOSIS — Z7902 Long term (current) use of antithrombotics/antiplatelets: Secondary | ICD-10-CM | POA: Insufficient documentation

## 2020-12-20 DIAGNOSIS — F329 Major depressive disorder, single episode, unspecified: Secondary | ICD-10-CM | POA: Diagnosis present

## 2020-12-20 DIAGNOSIS — F1010 Alcohol abuse, uncomplicated: Secondary | ICD-10-CM

## 2020-12-20 DIAGNOSIS — Z20822 Contact with and (suspected) exposure to covid-19: Secondary | ICD-10-CM | POA: Insufficient documentation

## 2020-12-20 DIAGNOSIS — I69391 Dysphagia following cerebral infarction: Secondary | ICD-10-CM

## 2020-12-20 DIAGNOSIS — Z87891 Personal history of nicotine dependence: Secondary | ICD-10-CM | POA: Insufficient documentation

## 2020-12-20 DIAGNOSIS — R45851 Suicidal ideations: Secondary | ICD-10-CM | POA: Insufficient documentation

## 2020-12-20 DIAGNOSIS — Z87898 Personal history of other specified conditions: Secondary | ICD-10-CM

## 2020-12-20 DIAGNOSIS — I63411 Cerebral infarction due to embolism of right middle cerebral artery: Secondary | ICD-10-CM | POA: Diagnosis present

## 2020-12-20 DIAGNOSIS — Z046 Encounter for general psychiatric examination, requested by authority: Secondary | ICD-10-CM | POA: Insufficient documentation

## 2020-12-20 DIAGNOSIS — Z8673 Personal history of transient ischemic attack (TIA), and cerebral infarction without residual deficits: Secondary | ICD-10-CM | POA: Insufficient documentation

## 2020-12-20 DIAGNOSIS — R7989 Other specified abnormal findings of blood chemistry: Secondary | ICD-10-CM | POA: Diagnosis present

## 2020-12-20 LAB — COMPREHENSIVE METABOLIC PANEL
ALT: 48 U/L — ABNORMAL HIGH (ref 0–44)
AST: 98 U/L — ABNORMAL HIGH (ref 15–41)
Albumin: 4.6 g/dL (ref 3.5–5.0)
Alkaline Phosphatase: 104 U/L (ref 38–126)
Anion gap: 11 (ref 5–15)
BUN: 8 mg/dL (ref 6–20)
CO2: 25 mmol/L (ref 22–32)
Calcium: 9.1 mg/dL (ref 8.9–10.3)
Chloride: 104 mmol/L (ref 98–111)
Creatinine, Ser: 0.91 mg/dL (ref 0.61–1.24)
GFR, Estimated: >60 mL/min (ref >60)
Glucose, Bld: 97 mg/dL (ref 70–99)
Potassium: 3.6 mmol/L (ref 3.5–5.1)
Sodium: 140 mmol/L (ref 135–145)
Total Bilirubin: 1.1 mg/dL (ref 0.3–1.2)
Total Protein: 7.9 g/dL (ref 6.5–8.1)

## 2020-12-20 LAB — ACETAMINOPHEN LEVEL: Acetaminophen (Tylenol), Serum: <10 ug/mL — ABNORMAL LOW (ref 10–30)

## 2020-12-20 LAB — CBC
HCT: 41.7 % (ref 39.0–52.0)
Hemoglobin: 14.7 g/dL (ref 13.0–17.0)
MCH: 30.8 pg (ref 26.0–34.0)
MCHC: 35.3 g/dL (ref 30.0–36.0)
MCV: 87.2 fL (ref 80.0–100.0)
Platelets: 186 10*3/uL (ref 150–400)
RBC: 4.78 MIL/uL (ref 4.22–5.81)
RDW: 12.2 % (ref 11.5–15.5)
WBC: 7.7 10*3/uL (ref 4.0–10.5)
nRBC: 0 % (ref 0.0–0.2)

## 2020-12-20 LAB — ETHANOL: Alcohol, Ethyl (B): 227 mg/dL — ABNORMAL HIGH (ref <10)

## 2020-12-20 LAB — SALICYLATE LEVEL: Salicylate Lvl: <7 mg/dL — ABNORMAL LOW (ref 7.0–30.0)

## 2020-12-20 NOTE — ED Notes (Signed)
Pt cooperative after speak with EDP. Pt dressed out by tech, Alissa. Unable to obtain bloodwork at this time.

## 2020-12-20 NOTE — Telephone Encounter (Signed)
I called pt/ wife again and via [place] spanish interpreter ID [phone] that returned call. (2nd call).

## 2020-12-20 NOTE — ED Triage Notes (Signed)
Pt to ED via [facility] under IVC with papers that state pt has hx of strokes, brain damage d/t strokes, not taking medications regularly, stated SI at home and stopped by family member, has been drinking excessively for 5 days.  Upon arrival pt denies SI/HI, A/V hallucinations.  States has been drinking recently, 5 12oz cans of beer today, denies drugs.  Pt refusing blood work in triage, getting upset about having blood work drawn yesterday.  Wanting to leave, this RN explained in detail IVC paperwork and that he can't leave.  Asked if patient would dress into hospital appropriate clothes which pt agreed but when asked to take shoes off patient states "no no no, I just want to leave".  Pt walked back to ED room with tech and [employer] at this time.

## 2020-12-20 NOTE — ED Notes (Signed)
Report received from [employer] PT transferred to [facility]. Pt oriented to unit. Pt given warm blankets. Denies further needs at this time. NAD noted.

## 2020-12-20 NOTE — ED Provider Notes (Signed)
[facility] Emergency Department Provider Note ____________________________________________   Event Date/Time   First MD Initiated Contact with Patient 12/20/20 2159     (approximate)  I have reviewed the triage vital signs and the nursing notes.  HISTORY  Chief Complaint Alcohol Problem and Psychiatric Evaluation   HPI Mark Oneal is a 35 y.o. malewho presents to the ED for evaluation of alcoholism and suicidality  Chart review indicates I saw this patient yesterday, about 24 hours ago, and I remember him distinctly.  He has a history of stroke on Plavix and a seizure disorder on Keppra.  Depression since his most recent stroke, for which he self medicates with alcohol.  Last night he came in voluntarily and clinically intoxicated requesting assistance with his ethanol use, sobered up while in the ED and left without talking to social work or psychiatry.  Patient presents to the ED again this evening now under IVC.  It sounds like his stepson, Jeannett Senior, who I spoke with multiple times last night, and who picked the patient up from the ED last night, pulled papers on the patient due to concern for suicidality.  I reviewed this paperwork and there is concerned that patient "was attempting to commit suicide when family member stopped him."  Here in the ED patient denies this and reports he is not suicidal and has no plans to harm himself.  He is clinically intoxicated and repeatedly indicates that he wants to go back to his family.  Denies coingestions beyond his fear of choice, [place].  Reports drinking 5 or 6 beers today.   Past Medical History:  Diagnosis Date  . History of stroke involving cerebellum 05/2019    Patient Active Problem List   Diagnosis Date Noted  . 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 Elevated liver function tests 08/02/2020  . Hemi-inattention--left 07/21/2020  . Acute ischemic right MCA stroke (HCC) 07/12/2020  . Dysphagia,  post-stroke   . Aspiration pneumonia (HCC)   . Seizures (HCC) 07/07/2020  . Hyperlipidemia LDL goal <70 07/07/2020  . 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 Obesity 07/07/2020  . Leukocytosis 07/07/2020  . History of CVA with residual deficit   . Cerebral infarction due to embolism of R ICA, R MCA vs dissection (HCC) s/p IR w/ revascularization 07/03/2020  . 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 Encephalopathy acute     Past Surgical History:  Procedure Laterality Date  . BUBBLE STUDY  07/06/2020   Procedure: BUBBLE STUDY;  Surgeon: Little Ishikawa, MD;  Location: [facility] ENDOSCOPY;  Service: Cardiovascular;;  . IR ANGIO INTRA EXTRACRAN SEL COM CAROTID INNOMINATE UNI L MOD SED  07/03/2020  . IR CT HEAD LTD  07/03/2020  . IR CT HEAD LTD  07/03/2020  . IR PERCUTANEOUS ART THROMBECTOMY/INFUSION INTRACRANIAL INC DIAG ANGIO  07/03/2020  . IR US GUIDE VASC ACCESS RIGHT  07/03/2020  . RADIOLOGY WITH ANESTHESIA N/A 07/03/2020   Procedure: IR WITH ANESTHESIA;  Surgeon: Radiologist, Medication, MD;  Location: [facility] OR;  Service: Radiology;  Laterality: N/A;  . TEE WITHOUT CARDIOVERSION N/A 07/06/2020   Procedure: TRANSESOPHAGEAL ECHOCARDIOGRAM (TEE);  Surgeon: Little Ishikawa, MD;  Location: [facility] ENDOSCOPY;  Service: Cardiovascular;  Laterality: N/A;    Prior to Admission medications   Medication Sig Start Date End Date Taking? Authorizing Provider  atorvastatin (LIPITOR) 80 MG tablet TOME UNA TABLETA TODOS LOS DIAS 12/01/20   Ranelle Oyster, MD  citalopram (CELEXA) 10 MG tablet TAKE 1 TABLET BY MOUTH  EVERYDAY AT BEDTIME 11/17/20   Ranelle Oyster, MD  clopidogrel (PLAVIX) 75 MG tablet TOME UNA TABLETA TODOS LOS DIAS 12/01/20   Ranelle Oyster, MD  docusate sodium (COLACE) 100 MG capsule TAKE 1 CAPSULE (100 MG TOTAL) BY MOUTH 2 (TWO) TIMES DAILY. 07/21/20 07/21/21  Love, Evlyn Kanner, PA-C  levETIRAcetam (KEPPRA) 500 MG tablet Take 1 tablet (500 mg total) by  mouth 2 (two) times daily. 08/02/20   Ranelle Oyster, MD  Multiple Vitamin (MULTIVITAMIN WITH MINERALS) TABS tablet Take 1 tablet by mouth daily. 07/22/20   Love, Evlyn Kanner, PA-C  pantoprazole (PROTONIX) 40 MG tablet TAKE 1 TABLET (40 MG TOTAL) BY MOUTH DAILY AT 12 NOON. 12/01/20   Ranelle Oyster, MD  senna-docusate (SENOKOT-S) 8.6-50 MG tablet Take 1 tablet by mouth 2 (two) times daily. 07/12/20   Layne Benton, NP    Allergies Patient has no known allergies.  Family History  Problem Relation Age of Onset  . Hypertension Mother   . Hypertension Father     Social History Social History   Tobacco Use  . Smoking status: Former Smoker    Types: Cigarettes  . Smokeless tobacco: Never Used  . Tobacco comment: quit after stroke in 2020  Substance Use Topics  . Alcohol use: Yes    Alcohol/week: 3.0 standard drinks    Types: 3 Cans of beer per week    Comment: 20 ounce cans  . Drug use: Not Currently   Review of Systems  Constitutional: No fever/chills Eyes: No visual changes. ENT: No sore throat. Cardiovascular: Denies chest pain. Respiratory: Denies shortness of breath. Gastrointestinal: No abdominal pain.  No nausea, no vomiting.  No diarrhea.  No constipation. Genitourinary: Negative for dysuria. Musculoskeletal: Negative for back pain. Skin: Negative for rash. Neurological: Negative for headaches, focal weakness or numbness. ____________________________________________  PHYSICAL EXAM:  VITAL SIGNS: Vitals:   12/20/20 2139  BP: (!) 122/99  Pulse: 82  Resp: 15  Temp: 98.3 F (36.8 C)  SpO2: 99%    Constitutional: Alert and oriented.  Clinically intoxicated with mild dysarthria Eyes: Conjunctivae are normal. PERRL. EOMI. Head: Atraumatic. Nose: No congestion/rhinnorhea. Mouth/Throat: Mucous membranes are moist.  Oropharynx non-erythematous. Neck: No stridor. No cervical spine tenderness to palpation. Cardiovascular: Normal rate, regular rhythm. Grossly  normal heart sounds.  Good peripheral circulation. Respiratory: Normal respiratory effort.  No retractions. Lungs CTAB. Gastrointestinal: Soft , nondistended, nontender to palpation. No CVA tenderness. Musculoskeletal: No lower extremity tenderness nor edema.  No joint effusions. No signs of acute trauma. Neurologic:   No gross focal neurologic deficits are appreciated.  Clinically intoxicated with mild dysarthria and gait instability, but nonfocal exam without laterality. Skin:  Skin is warm, dry and intact. No rash noted. Psychiatric: Mood and affect are normal. Speech and behavior are normal. ____________________________________________   LABS (all labs ordered are listed, but only abnormal results are displayed)  Labs Reviewed  COMPREHENSIVE METABOLIC PANEL  ETHANOL  SALICYLATE LEVEL  ACETAMINOPHEN LEVEL  CBC  URINE DRUG SCREEN, QUALITATIVE ([facility] ONLY)   ____________________________________________  12 Lead EKG  Sinus rhythm, rate of 62 bpm.  Normal axis and intervals.  No evidence of acute ischemia. ____________________________________________   PROCEDURES and INTERVENTIONS  Procedure(s) performed (including Critical Care):  Procedures  Medications - No data to display  ____________________________________________   MDM / ED COURSE   35 year old male with known alcoholism presents to the ED under IVC due to concern for suicidality at home.  He is clinically intoxicated  here without evidence of distress or neurovascular deficits.  No signs of trauma.  He presents clinically identical as when I saw him last night.  With the addition of documented familial concerns that he was attempting to commit suicide this evening, we will uphold IVC and hold the patient in the ED to allow him to sober up and have psychiatry evaluate the patient.     ____________________________________________   FINAL CLINICAL IMPRESSION(S) / ED DIAGNOSES  Final diagnoses:  Alcohol abuse   Suicidal ideation     ED Discharge Orders    None       Cohl Behrens Katrinka Blazing   Note:  This document was prepared using Dragon voice recognition software and may include unintentional dictation errors.   Delton Prairie, MD 12/20/20 [phone]

## 2020-12-21 ENCOUNTER — Encounter: Payer: Self-pay | Admitting: Psychiatry

## 2020-12-21 ENCOUNTER — Inpatient Hospital Stay
Admission: AD | Admit: 2020-12-21 | Discharge: 2020-12-25 | DRG: 885 | Disposition: A | Discharge status: Home / Self Care | Payer: 59 | Source: Intra-hospital | Attending: Behavioral Health | Admitting: Behavioral Health

## 2020-12-21 DIAGNOSIS — Z87898 Personal history of other specified conditions: Secondary | ICD-10-CM

## 2020-12-21 DIAGNOSIS — E785 Hyperlipidemia, unspecified: Secondary | ICD-10-CM | POA: Diagnosis present

## 2020-12-21 DIAGNOSIS — I6930 Unspecified sequelae of cerebral infarction: Secondary | ICD-10-CM | POA: Diagnosis not present

## 2020-12-21 DIAGNOSIS — F322 Major depressive disorder, single episode, severe without psychotic features: Secondary | ICD-10-CM

## 2020-12-21 DIAGNOSIS — K219 Gastro-esophageal reflux disease without esophagitis: Secondary | ICD-10-CM | POA: Diagnosis present

## 2020-12-21 DIAGNOSIS — Z87891 Personal history of nicotine dependence: Secondary | ICD-10-CM

## 2020-12-21 DIAGNOSIS — R45851 Suicidal ideations: Secondary | ICD-10-CM | POA: Diagnosis present

## 2020-12-21 DIAGNOSIS — F1010 Alcohol abuse, uncomplicated: Secondary | ICD-10-CM

## 2020-12-21 DIAGNOSIS — G40909 Epilepsy, unspecified, not intractable, without status epilepticus: Secondary | ICD-10-CM | POA: Diagnosis present

## 2020-12-21 DIAGNOSIS — F1020 Alcohol dependence, uncomplicated: Secondary | ICD-10-CM | POA: Diagnosis present

## 2020-12-21 DIAGNOSIS — G4700 Insomnia, unspecified: Secondary | ICD-10-CM | POA: Diagnosis present

## 2020-12-21 DIAGNOSIS — I63411 Cerebral infarction due to embolism of right middle cerebral artery: Secondary | ICD-10-CM

## 2020-12-21 DIAGNOSIS — Z20822 Contact with and (suspected) exposure to covid-19: Secondary | ICD-10-CM | POA: Diagnosis present

## 2020-12-21 DIAGNOSIS — F411 Generalized anxiety disorder: Secondary | ICD-10-CM | POA: Diagnosis present

## 2020-12-21 DIAGNOSIS — Y907 Blood alcohol level of 200-239 mg/100 ml: Secondary | ICD-10-CM | POA: Diagnosis present

## 2020-12-21 DIAGNOSIS — R569 Unspecified convulsions: Secondary | ICD-10-CM

## 2020-12-21 DIAGNOSIS — Z9114 Patient's other noncompliance with medication regimen: Secondary | ICD-10-CM | POA: Diagnosis not present

## 2020-12-21 LAB — RESP PANEL BY RT-PCR (FLU A&B, COVID) ARPGX2
Influenza A by PCR: NEGATIVE
Influenza B by PCR: NEGATIVE
SARS Coronavirus 2 by RT PCR: NEGATIVE

## 2020-12-21 MED ORDER — MAGNESIUM HYDROXIDE 400 MG/5ML PO SUSP
30.0000 mL | Freq: Every day | ORAL | Status: DC | PRN
Start: 2020-12-21 — End: 2020-12-25

## 2020-12-21 MED ORDER — LORAZEPAM 1 MG PO TABS
1.0000 mg | ORAL_TABLET | ORAL | Status: AC | PRN
  Administered 2020-12-21 (×2): 2 mg via ORAL
  Administered 2020-12-22: 1 mg via ORAL
  Administered 2020-12-22: 2 mg via ORAL
  Administered 2020-12-22: 1 mg via ORAL
  Administered 2020-12-23: 2 mg via ORAL
  Administered 2020-12-24: 1 mg via ORAL
  Filled 2020-12-21: qty 2
  Filled 2020-12-21 (×2): qty 1
  Filled 2020-12-21 (×2): qty 2
  Filled 2020-12-21 (×2): qty 1
  Filled 2020-12-21: qty 2

## 2020-12-21 MED ORDER — LORAZEPAM 2 MG PO TABS: 0.0000 mg | ORAL_TABLET | Freq: Four times a day (QID) | ORAL | Status: DC

## 2020-12-21 MED ORDER — DOCUSATE SODIUM 100 MG PO CAPS
100.0000 mg | ORAL_CAPSULE | Freq: Two times a day (BID) | ORAL | Status: DC
  Administered 2020-12-22 – 2020-12-25 (×7): 100 mg via ORAL
  Filled 2020-12-21 (×7): qty 1

## 2020-12-21 MED ORDER — ONDANSETRON 4 MG PO TBDP
4.0000 mg | ORAL_TABLET | Freq: Once | ORAL | Status: AC
  Administered 2020-12-21: 4 mg via ORAL
  Filled 2020-12-21: qty 1

## 2020-12-21 MED ORDER — DOCUSATE SODIUM 100 MG PO CAPS
100.0000 mg | ORAL_CAPSULE | Freq: Two times a day (BID) | ORAL | Status: DC
  Administered 2020-12-21: 100 mg via ORAL
  Filled 2020-12-21: qty 1

## 2020-12-21 MED ORDER — ATORVASTATIN CALCIUM 20 MG PO TABS
80.0000 mg | ORAL_TABLET | Freq: Every day | ORAL | Status: DC
  Administered 2020-12-21: 80 mg via ORAL
  Filled 2020-12-21: qty 4

## 2020-12-21 MED ORDER — CITALOPRAM HYDROBROMIDE 20 MG PO TABS
10.0000 mg | ORAL_TABLET | Freq: Every day | ORAL | Status: DC
  Administered 2020-12-21: 10 mg via ORAL
  Filled 2020-12-21: qty 1

## 2020-12-21 MED ORDER — CLOPIDOGREL BISULFATE 75 MG PO TABS
75.0000 mg | ORAL_TABLET | Freq: Every day | ORAL | Status: DC
  Administered 2020-12-22 – 2020-12-25 (×4): 75 mg via ORAL
  Filled 2020-12-21 (×6): qty 1

## 2020-12-21 MED ORDER — THIAMINE HCL 100 MG/ML IJ SOLN
100.0000 mg | Freq: Every day | INTRAMUSCULAR | Status: DC
  Filled 2020-12-21: qty 1

## 2020-12-21 MED ORDER — ONDANSETRON 4 MG PO TBDP: 4.0000 mg | ORAL_TABLET | Freq: Three times a day (TID) | ORAL | Status: DC | PRN

## 2020-12-21 MED ORDER — LORAZEPAM 2 MG/ML IJ SOLN: 1.0000 mg | INTRAMUSCULAR | Status: AC | PRN

## 2020-12-21 MED ORDER — LORAZEPAM 2 MG PO TABS: 0.0000 mg | ORAL_TABLET | Freq: Two times a day (BID) | ORAL | Status: DC

## 2020-12-21 MED ORDER — CITALOPRAM HYDROBROMIDE 20 MG PO TABS: 10.0000 mg | ORAL_TABLET | Freq: Every day | ORAL | Status: DC

## 2020-12-21 MED ORDER — THIAMINE HCL 100 MG/ML IJ SOLN: 100.0000 mg | Freq: Every day | INTRAMUSCULAR | Status: DC

## 2020-12-21 MED ORDER — LORAZEPAM 2 MG/ML IJ SOLN
0.0000 mg | Freq: Two times a day (BID) | INTRAMUSCULAR | Status: DC
Start: 2020-12-23 — End: 2020-12-21

## 2020-12-21 MED ORDER — LEVETIRACETAM 500 MG PO TABS
500.0000 mg | ORAL_TABLET | Freq: Two times a day (BID) | ORAL | Status: DC
  Administered 2020-12-21: 500 mg via ORAL
  Filled 2020-12-21 (×3): qty 1

## 2020-12-21 MED ORDER — LEVETIRACETAM 500 MG PO TABS
500.0000 mg | ORAL_TABLET | Freq: Two times a day (BID) | ORAL | Status: DC
  Administered 2020-12-22 – 2020-12-25 (×7): 500 mg via ORAL
  Filled 2020-12-21 (×8): qty 1

## 2020-12-21 MED ORDER — LORAZEPAM 2 MG PO TABS
0.0000 mg | ORAL_TABLET | Freq: Four times a day (QID) | ORAL | Status: DC
  Administered 2020-12-21 (×2): 2 mg via ORAL
  Filled 2020-12-21 (×2): qty 1

## 2020-12-21 MED ORDER — LORAZEPAM 2 MG/ML IJ SOLN: 0.0000 mg | Freq: Four times a day (QID) | INTRAMUSCULAR | Status: DC

## 2020-12-21 MED ORDER — ALUM & MAG HYDROXIDE-SIMETH 200-200-20 MG/5ML PO SUSP: 30.0000 mL | ORAL | Status: DC | PRN

## 2020-12-21 MED ORDER — THIAMINE HCL 100 MG PO TABS
100.0000 mg | ORAL_TABLET | Freq: Every day | ORAL | Status: DC
  Administered 2020-12-22 – 2020-12-25 (×4): 100 mg via ORAL
  Filled 2020-12-21 (×4): qty 1

## 2020-12-21 MED ORDER — ACETAMINOPHEN 325 MG PO TABS
650.0000 mg | ORAL_TABLET | Freq: Four times a day (QID) | ORAL | Status: DC | PRN
  Administered 2020-12-22: 650 mg via ORAL
  Filled 2020-12-21: qty 2

## 2020-12-21 MED ORDER — THIAMINE HCL 100 MG PO TABS
100.0000 mg | ORAL_TABLET | Freq: Every day | ORAL | Status: DC
Start: 2020-12-21 — End: 2020-12-21
  Administered 2020-12-21: 100 mg via ORAL
  Filled 2020-12-21: qty 1

## 2020-12-21 MED ORDER — ONDANSETRON 4 MG PO TBDP
4.0000 mg | ORAL_TABLET | Freq: Three times a day (TID) | ORAL | Status: DC | PRN
  Administered 2020-12-21: 4 mg via ORAL
  Filled 2020-12-21 (×3): qty 1

## 2020-12-21 MED ORDER — PANTOPRAZOLE SODIUM 40 MG PO TBEC
40.0000 mg | DELAYED_RELEASE_TABLET | Freq: Every day | ORAL | Status: DC
  Administered 2020-12-22 – 2020-12-25 (×4): 40 mg via ORAL
  Filled 2020-12-21 (×4): qty 1

## 2020-12-21 MED ORDER — NALTREXONE HCL 50 MG PO TABS
50.0000 mg | ORAL_TABLET | Freq: Every day | ORAL | Status: DC
  Administered 2020-12-22 – 2020-12-25 (×4): 50 mg via ORAL
  Filled 2020-12-21 (×4): qty 1

## 2020-12-21 MED ORDER — ATORVASTATIN CALCIUM 20 MG PO TABS
80.0000 mg | ORAL_TABLET | Freq: Every day | ORAL | Status: DC
  Administered 2020-12-22 – 2020-12-25 (×4): 80 mg via ORAL
  Filled 2020-12-21 (×4): qty 4

## 2020-12-21 MED ORDER — PANTOPRAZOLE SODIUM 40 MG PO TBEC
40.0000 mg | DELAYED_RELEASE_TABLET | Freq: Every day | ORAL | Status: DC
  Administered 2020-12-21: 40 mg via ORAL
  Filled 2020-12-21: qty 1

## 2020-12-21 MED ORDER — LORAZEPAM 2 MG/ML IJ SOLN: 0.0000 mg | Freq: Two times a day (BID) | INTRAMUSCULAR | Status: DC

## 2020-12-21 NOTE — ED Notes (Signed)
Pt to the bathroom multiple times and can be heard vomiting.  EDP made aware.  Medication given as ordered,

## 2020-12-21 NOTE — Consult Note (Signed)
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 male patient admitted [facility] via law enforcement under Involuntary Commitment Status (IVC).    Per the ED triage nurse, note the Pt to ED via [employer] under IVC with papers that state pt has hx of strokes, brain damage d/t strokes, not taking medications regularly, stated SI at home and stopped by family member, has been drinking excessively for five days. Upon arrival, pt denies SI/HI, A/V hallucinations. States has been drinking recently, 5 12oz cans of beer today, denies drugs. Pt refused blood work in triage, getting upset about having blood work drawn yesterday. Wanting to leave, this RN explained in detail IVC paperwork and that he can't leave. Asked if the patient would dress in hospital-appropriate clothes, the patient agreed, but when asked to take shoes off, the patient stated, "No, no, no, I just want to leave ."Pt walked back to the ED room with tech and [employer] at this time.  The patient reports drinking "5-6 beers" last night. The patient presented to ED on 4/5/ and 12/20/20. BAL is 277 mg/dl and 409227 mg/dl.  The patient was seen face-to-face by this provider; the chart was reviewed and consulted with Dr. Elesa MassedWard on 12/21/2020 due to the patient's care. It was discussed with the EDP that the patient does meet the criteria to be admitted to the psychiatric inpatient unit.   On evaluation, the patient is alert and oriented x4, calm and cooperative, and mood-congruent with affect. The patient does not appear to be responding to internal or external stimuli. Neither is the patient presenting with any delusional thinking. The patient denies auditory or visual hallucinations. The patient admits to suicidal ideation but denies homicidal or self-harm ideations. The patient is not presenting with any psychotic or paranoid behaviors. During an encounter with the patient, he answered some questions appropriately.  HPI:    Past Psychiatric History:  Reactive depression Seizures (HCC)  Risk to Self:   Yes Risk to Others:   Yes Prior Inpatient Therapy:  Yes Prior Outpatient Therapy:  No  Past Medical History:  Past Medical History:  Diagnosis Date  . 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 BUBBLE STUDY  07/06/2020   Procedure: BUBBLE STUDY;  Surgeon: Little IshikawaSchumann, Christopher L, MD;  [facility];  Service: Cardiovascular;;  . IR ANGIO INTRA EXTRACRAN SEL COM CAROTID INNOMINATE UNI L MOD SED  07/03/2020  . IR CT HEAD LTD  07/03/2020  . IR CT HEAD LTD  07/03/2020  . IR PERCUTANEOUS ART THROMBECTOMY/INFUSION INTRACRANIAL INC DIAG ANGIO  07/03/2020  . IR US GUIDE VASC ACCESS RIGHT  07/03/2020  . RADIOLOGY WITH ANESTHESIA N/A 07/03/2020   Procedure: IR WITH ANESTHESIA;  Surgeon: Radiologist, Medication, MD;  Location: [facility] OR;  Service: Radiology;  Laterality: N/A;  . TEE WITHOUT CARDIOVERSION N/A 07/06/2020   Procedure: TRANSESOPHAGEAL ECHOCARDIOGRAM (TEE);  Surgeon: Little IshikawaSchumann, Christopher L, MD;  Location: [facility] ENDOSCOPY;  Service: Cardiovascular;  Laterality: N/A;   Family History:  Family History  Problem Relation Age of Onset  . Hypertension Mother   . Hypertension Father    Family Psychiatric  History:  Social History:  Social History   Substance and Sexual Activity  Alcohol Use Yes  .  Alcohol/week: 3.0 standard drinks  . Types: 3 Cans of beer per week   Comment: 20 ounce cans     Social History   Substance and Sexual Activity  Drug Use Not Currently    Social History   Socioeconomic History  . Marital status: Single    Spouse name: [name]  . Number of children: 2  . Years of education: Not on file  . Highest education level: Not on file  Occupational History  . Not on file  Tobacco Use  . Smoking status: Former Smoker    Types: Cigarettes  . Smokeless tobacco: Never Used  . Tobacco comment: quit after stroke in 2020  Substance and Sexual Activity  . Alcohol use: Yes    Alcohol/week: 3.0 standard drinks    Types: 3 Cans of beer per week    Comment: 20 ounce cans  . Drug use: Not Currently  . Sexual activity: Not on file  Other Topics Concern  . Not on file  Social History Narrative   Lives with his partner of 8 years and has 2 kids with her. All of his family is in a different country.   Social Determinants of Health   Financial Resource Strain: Not on file  Food Insecurity: Not on file  Transportation Needs: Not on file  Physical Activity: Not on file  Stress: Not on file  Social Connections: Not on file   Additional Social History:    Allergies:  No Known Allergies  Labs:  Results for orders placed or performed during the hospital encounter of 12/20/20 (from the past 48 hour(s))  Comprehensive metabolic panel     Status: Abnormal   Collection Time: 12/20/20 11:14 PM  Result Value Ref Range   Sodium 140 135 - 145 mmol/L   Potassium 3.6 3.5 - 5.1 mmol/L   Chloride 104 98 - 111 mmol/L   CO2 25 22 - 32 mmol/L   Glucose, Bld 97 70 - 99 mg/dL    Comment: Glucose reference range applies only to samples taken after fasting for at least 8 hours.   BUN 8 6 - 20 mg/dL   Creatinine, Ser 1.61 0.61 - 1.24 mg/dL   Calcium 9.1 8.9 - 09.6 mg/dL   Total Protein 7.9 6.5 - 8.1 g/dL   Albumin 4.6 3.5 - 5.0 g/dL   AST 98 (H) 15 - 41 U/L   ALT 48 (H) 0  - 44 U/L   Alkaline Phosphatase 104 38 - 126 U/L   Total Bilirubin 1.1 0.3 - 1.2 mg/dL   GFR, Estimated >04 >54 mL/min    Comment: (NOTE) Calculated using the CKD-EPI Creatinine Equation (2021)    Anion gap 11 5 - 15    Comment: Performed at [facility], [address] [place], [place] [address]  Ethanol     Status: Abnormal   Collection Time: 12/20/20 11:14 PM  Result Value Ref Range   Alcohol, Ethyl (B) 227 (H) <10 mg/dL    Comment: (NOTE) Lowest detectable limit for serum alcohol is 10 mg/dL.  For medical purposes only. Performed at [facility], [address], [place], [place] [address]   Salicylate  level     Status: Abnormal   Collection Time: 12/20/20 11:14 PM  Result Value Ref Range   Salicylate Lvl <7.0 (L) 7.0 - 30.0 mg/dL    Comment: Performed at [facility], [address] [place], [place] [address]  Acetaminophen level     Status: Abnormal   Collection Time: 12/20/20 11:14 PM  Result Value Ref Range   Acetaminophen (Tylenol), Serum <10 (L) 10 - 30 ug/mL    Comment: (NOTE) Therapeutic concentrations vary significantly. A range of 10-30 ug/mL  may be an effective concentration for many patients. However, some  are best treated at concentrations outside of this range. Acetaminophen concentrations >150 ug/mL at 4 hours after ingestion  and >50 ug/mL at 12 hours after ingestion are often associated with  toxic reactions.  Performed at [facility], [address] [place], [place] [address]   cbc     Status: None   Collection Time: 12/20/20 11:14 PM  Result Value Ref Range   WBC 7.7 4.0 - 10.5 K/uL   RBC 4.78 4.22 - 5.81 MIL/uL   Hemoglobin 14.7 13.0 - 17.0 g/dL   HCT 82.4 23.5 - 36.1 %   MCV 87.2 80.0 - 100.0 fL   MCH 30.8 26.0 - 34.0 pg   MCHC 35.3 30.0 - 36.0 g/dL   RDW 44.3 15.4 - 00.8 %   Platelets 186 150 - 400 K/uL   nRBC 0.0 0.0 - 0.2 %    Comment: Performed at [facility], [address] [place], [place] [address]  Resp Panel by RT-PCR (Flu A&B, Covid) Nasopharyngeal Swab     Status: None   Collection Time: 12/21/20 12:00 AM   Specimen: Nasopharyngeal Swab; Nasopharyngeal(NP) swabs in vial transport medium  Result Value Ref Range   SARS Coronavirus 2 by RT PCR NEGATIVE NEGATIVE    Comment: (NOTE) SARS-CoV-2 target nucleic acids are NOT DETECTED.  The SARS-CoV-2 RNA is generally detectable in upper respiratory specimens during the acute phase of infection. The lowest concentration of SARS-CoV-2 viral copies this assay can detect is 138 copies/mL. A negative result does not preclude SARS-Cov-2 infection and should not be used as the sole basis for treatment or other patient management decisions. A negative result may occur with  improper specimen collection/handling, submission of specimen other than nasopharyngeal swab, presence of viral mutation(s) within the areas targeted by this assay, and inadequate number of viral copies(<138 copies/mL). A negative result must be combined with clinical observations, patient history, and epidemiological information. The expected result is Negative.  Fact Sheet for Patients:  BloggerCourse.com  Fact Sheet for Healthcare Providers:  SeriousBroker.it  This test is no t yet approved or cleared by the [place] FDA and  has been authorized for detection and/or diagnosis of SARS-CoV-2 by FDA under an Emergency Use Authorization (EUA). This EUA will remain  in effect (meaning this test can be used) for the duration of the COVID-19 declaration under Section 564(b)(1) of the Act, 21 U.S.C.section 360bbb-3(b)(1), unless the authorization is terminated  or revoked sooner.       Influenza A by PCR NEGATIVE NEGATIVE   Influenza B by PCR NEGATIVE NEGATIVE    Comment: (NOTE) The Xpert Xpress SARS-CoV-2/FLU/RSV plus assay is intended as an aid in the diagnosis of influenza from  Nasopharyngeal swab specimens and should not be used as a sole basis for treatment. Nasal washings and aspirates are unacceptable for Xpert Xpress SARS-CoV-2/FLU/RSV testing.  Fact Sheet for Patients: BloggerCourse.com  Fact Sheet for Healthcare Providers: SeriousBroker.it  This test is not yet approved or cleared by the [place] FDA and has been authorized for detection and/or diagnosis of SARS-CoV-2 by FDA under an Emergency Use Authorization (EUA). This EUA will remain in effect (meaning this test can be used) for the duration of the COVID-19 declaration under Section 564(b)(1) of the Act, 21 U.S.C. section 360bbb-3(b)(1), unless the authorization is terminated or revoked.  Performed at [facility], [address] [place], [place] [address]     No current facility-administered medications for this encounter.   Current Outpatient Medications  Medication Sig Dispense Refill  . atorvastatin (LIPITOR) 80 MG tablet TOME UNA TABLETA TODOS LOS DIAS 90 tablet 1  . citalopram (CELEXA) 10 MG tablet TAKE 1 TABLET BY MOUTH EVERYDAY AT BEDTIME 90 tablet 1  . clopidogrel (PLAVIX) 75 MG tablet TOME UNA TABLETA TODOS LOS DIAS 90 tablet 1  . docusate sodium (COLACE) 100 MG capsule TAKE 1 CAPSULE (100 MG TOTAL) BY MOUTH 2 (TWO) TIMES DAILY. 60 capsule 0  . levETIRAcetam (KEPPRA) 500 MG tablet Take 1 tablet (500 mg total) by mouth 2 (two) times daily. 120 tablet 3  . Multiple Vitamin (MULTIVITAMIN WITH MINERALS) TABS tablet Take 1 tablet by mouth daily. 30 tablet 0  . pantoprazole (PROTONIX) 40 MG tablet TAKE 1 TABLET (40 MG TOTAL) BY MOUTH DAILY AT 12 NOON. 90 tablet 1  . senna-docusate (SENOKOT-S) 8.6-50 MG tablet Take 1 tablet by mouth 2 (two) times daily.      Musculoskeletal: Strength & Muscle Tone: within normal limits and cogwheel Gait & Station: normal Patient leans: Right, Front and N/A   Psychiatric Specialty  Exam:  Presentation  General Appearance: Disheveled  Eye Contact:Minimal  Speech:Garbled; Slow; Slurred  Speech Volume:Normal  Handedness:Right   Mood and Affect  Mood:Irritable; Dysphoric  Affect:Blunt; Congruent; Depressed; Inappropriate   Thought Process  Thought Processes:Coherent  Descriptions of Associations:Intact  Orientation:Full (Time, Place and Person)  Thought Content:Logical; WDL  History of Schizophrenia/Schizoaffective disorder:No  Duration of Psychotic Symptoms:No data recorded Hallucinations:Hallucinations: None  Ideas of Reference:None  Suicidal Thoughts:Suicidal Thoughts: Yes, Active SI Active Intent and/or Plan: With Intent; With Plan; With Means to Carry Out; With Access to Means  Homicidal Thoughts:No data recorded  Sensorium  Memory:Immediate Good; Recent Good; Remote Good  Judgment:Poor  Insight:Poor   Executive Functions  Concentration:Poor  Attention Span:Fair  Recall:No data recorded Fund of Knowledge:Fair  Language:Fair   Psychomotor Activity  Psychomotor Activity:Psychomotor Activity: Extrapyramidal Side Effects (EPS); Tremor Extrapyramidal Side Effects (EPS): Akathisia AIMS Completed?: No   Assets  Assets:Communication Skills; Desire for Improvement; Housing; Intimacy; Resilience; Social Support   Sleep  Sleep:Sleep: Good   Physical Exam: Physical Exam Vitals and nursing note reviewed. Exam conducted with a chaperone present.  Constitutional:      Appearance: Normal appearance.  HENT:     Nose: Nose normal.     Mouth/Throat:     Mouth: Mucous membranes are dry.  Cardiovascular:     Rate and Rhythm: Normal rate.  Pulmonary:     Effort: Pulmonary effort is normal.  Musculoskeletal:        General: Normal range of motion.     Cervical back: Normal range of motion and neck supple.  Neurological:     General: No focal deficit present.     Mental Status: He is alert and oriented to person, place, and  time.  Psychiatric:        Attention and Perception:  Attention and perception normal.        Mood and Affect: Mood is anxious and depressed.        Speech: Speech is delayed.        Behavior: Behavior is agitated.        Thought Content: Thought content includes suicidal ideation. Thought content includes suicidal plan.        Cognition and Memory: Cognition and memory normal.        Judgment: Judgment is impulsive.    Review of Systems  Psychiatric/Behavioral: Positive for depression, hallucinations, substance abuse and suicidal ideas.  All other systems reviewed and are negative.  Blood pressure (!) 122/99, pulse 82, temperature 98.3 F (36.8 C), temperature source Oral, resp. rate 15, height 5\' 3"  (1.6 m), weight 68 kg, SpO2 99 %. Body mass index is 26.56 kg/m.  Treatment Plan Summary: Plan The patient is a safety risk to himself and and requires psychiatric inpatient admission for stabilization and treatment.  Disposition: Recommend psychiatric Inpatient admission when medically cleared. Supportive therapy provided about ongoing stressors. The patient is a safety risk to himself and and requires psychiatric inpatient admission for stabilization and treatment.  , NP 12/21/2020 2:44 AM

## 2020-12-21 NOTE — Consult Note (Signed)
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 male patient admitted with "I am fine".  HPI: Patient seen chart reviewed.  Patient was offered the option of a Spanish language interpreter but declined the offer preferring to speak in [place].  Patient was sent here under IVC filed by stepson alleging that the patient has been making suicidal gestures while drinking heavily for many days not sleeping showing an agitated mood.  Patient was intoxicated on arrival with an elevated blood alcohol level.  Evaluation last night by nurse practitioner he admitted to daily alcohol abuse.  Minimize symptoms.  Admitted to recent suicidal statements denied acute suicidal intent.  Did not display any delirium or psychotic symptoms.  Patient admits he has been noncompliant with his medicine at home.  Does not know of any seizures that he has had recently.  Patient has a history of a serious stroke with some residual deficits and seizures.  Past Psychiatric History: Unknown past psychiatric history.  No evidence of past treatment for depression or substance abuse.  Denies past suicide  attempts.  Risk to Self:   Risk to Others:   Prior Inpatient Therapy:   Prior Outpatient Therapy:    Past Medical History:  Past Medical History:  Diagnosis Date  . 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 BUBBLE STUDY  07/06/2020   Procedure: BUBBLE STUDY;  Surgeon: Little Ishikawa, MD;  Location: [facility] ENDOSCOPY;  Service: Cardiovascular;;  . IR ANGIO INTRA EXTRACRAN SEL COM CAROTID INNOMINATE UNI L MOD SED  07/03/2020  . IR CT HEAD LTD  07/03/2020  . IR CT HEAD LTD  07/03/2020  . IR PERCUTANEOUS ART THROMBECTOMY/INFUSION INTRACRANIAL INC DIAG ANGIO  07/03/2020  . IR US GUIDE VASC ACCESS RIGHT  07/03/2020  . RADIOLOGY WITH ANESTHESIA N/A 07/03/2020   Procedure: IR WITH ANESTHESIA;  Surgeon: Radiologist, Medication, MD;  Location: [facility] OR;  Service: Radiology;  Laterality: N/A;  . TEE WITHOUT CARDIOVERSION N/A 07/06/2020   Procedure: TRANSESOPHAGEAL ECHOCARDIOGRAM (TEE);  Surgeon: Little Ishikawa, MD;  Location: [facility] ENDOSCOPY;  Service: Cardiovascular;  Laterality: N/A;   Family History:  Family History  Problem Relation Age of Onset  . Hypertension Mother   . Hypertension Father    Family Psychiatric  History: See previous Social History:  Social History   Substance and Sexual Activity  Alcohol Use Yes  . Alcohol/week: 3.0 standard drinks  . Types: 3 Cans of beer per week   Comment: 20 ounce cans     Social History   Substance and Sexual Activity  Drug Use Not Currently    Social History   Socioeconomic History  . Marital status:  Single    Spouse name: [name]  . Number of children: 2  . Years of education: Not on file  . Highest education level: Not on file  Occupational History  . Not on file  Tobacco Use  . Smoking status: Former Smoker    Types: Cigarettes  . Smokeless tobacco: Never Used  . Tobacco comment: quit after stroke in 2020  Substance and Sexual Activity  . Alcohol use:  Yes    Alcohol/week: 3.0 standard drinks    Types: 3 Cans of beer per week    Comment: 20 ounce cans  . Drug use: Not Currently  . Sexual activity: Not on file  Other Topics Concern  . Not on file  Social History Narrative   Lives with his partner of 8 years and has 2 kids with her. All of his family is in a different country.   Social Determinants of Health   Financial Resource Strain: Not on file  Food Insecurity: Not on file  Transportation Needs: Not on file  Physical Activity: Not on file  Stress: Not on file  Social Connections: Not on file   Additional Social History:    Allergies:  No Known Allergies  Labs:  Results for orders placed or performed during the hospital encounter of 12/20/20 (from the past 48 hour(s))  Comprehensive metabolic panel     Status: Abnormal   Collection Time: 12/20/20 11:14 PM  Result Value Ref Range   Sodium 140 135 - 145 mmol/L   Potassium 3.6 3.5 - 5.1 mmol/L   Chloride 104 98 - 111 mmol/L   CO2 25 22 - 32 mmol/L   Glucose, Bld 97 70 - 99 mg/dL    Comment: Glucose reference range applies only to samples taken after fasting for at least 8 hours.   BUN 8 6 - 20 mg/dL   Creatinine, Ser 8.50 0.61 - 1.24 mg/dL   Calcium 9.1 8.9 - 27.7 mg/dL   Total Protein 7.9 6.5 - 8.1 g/dL   Albumin 4.6 3.5 - 5.0 g/dL   AST 98 (H) 15 - 41 U/L   ALT 48 (H) 0 - 44 U/L   Alkaline Phosphatase 104 38 - 126 U/L   Total Bilirubin 1.1 0.3 - 1.2 mg/dL   GFR, Estimated >41 >28 mL/min    Comment: (NOTE) Calculated using the CKD-EPI Creatinine Equation (2021)    Anion gap 11 5 - 15    Comment: Performed at [facility], [address] [place], [place] [address]  Ethanol     Status: Abnormal   Collection Time: 12/20/20 11:14 PM  Result Value Ref Range   Alcohol, Ethyl (B) 227 (H) <10 mg/dL    Comment: (NOTE) Lowest detectable limit for serum alcohol is 10 mg/dL.  For medical purposes only. Performed at [facility], [address] [place], [place] [address]   Salicylate level     Status: Abnormal   Collection Time: 12/20/20 11:14 PM  Result Value Ref Range   Salicylate Lvl <7.0 (L) 7.0 - 30.0 mg/dL    Comment: Performed at [facility], [address] [place], [place] [address]  Acetaminophen level     Status: Abnormal   Collection Time: 12/20/20 11:14 PM  Result Value Ref Range   Acetaminophen (Tylenol), Serum <10 (L) 10 - 30 ug/mL    Comment: (NOTE) Therapeutic concentrations vary significantly. A range of 10-30 ug/mL  may be an effective concentration for many patients. However, some  are best  treated at concentrations outside of this range. Acetaminophen concentrations >150 ug/mL at 4 hours after ingestion  and >50 ug/mL at 12 hours after ingestion are often associated with  toxic reactions.  Performed at [facility], [address] [place], [place] [address]   cbc     Status: None   Collection Time: 12/20/20 11:14 PM  Result Value Ref Range   WBC 7.7 4.0 - 10.5 K/uL   RBC 4.78 4.22 - 5.81 MIL/uL   Hemoglobin 14.7 13.0 - 17.0 g/dL   HCT 13.0 86.5 - 78.4 %   MCV 87.2 80.0 - 100.0 fL   MCH 30.8 26.0 - 34.0 pg   MCHC 35.3 30.0 - 36.0 g/dL   RDW 69.6 29.5 - 28.4 %   Platelets 186 150 - 400 K/uL   nRBC 0.0 0.0 - 0.2 %    Comment: Performed at [facility], [address], [place], [place] [address]  Resp Panel by RT-PCR (Flu A&B, Covid) Nasopharyngeal Swab     Status: None   Collection Time: 12/21/20 12:00 AM   Specimen: Nasopharyngeal Swab; Nasopharyngeal(NP) swabs in vial transport medium  Result Value Ref Range   SARS Coronavirus 2 by RT PCR NEGATIVE NEGATIVE    Comment: (NOTE) SARS-CoV-2 target nucleic acids are NOT DETECTED.  The SARS-CoV-2 RNA is generally detectable in upper respiratory specimens during the acute phase of infection. The lowest concentration of SARS-CoV-2 viral copies this assay can detect is 138 copies/mL. A negative result does not preclude  SARS-Cov-2 infection and should not be used as the sole basis for treatment or other patient management decisions. A negative result may occur with  improper specimen collection/handling, submission of specimen other than nasopharyngeal swab, presence of viral mutation(s) within the areas targeted by this assay, and inadequate number of viral copies(<138 copies/mL). A negative result must be combined with clinical observations, patient history, and epidemiological information. The expected result is Negative.  Fact Sheet for Patients:  BloggerCourse.com  Fact Sheet for Healthcare Providers:  SeriousBroker.it  This test is no t yet approved or cleared by the [place] FDA and  has been authorized for detection and/or diagnosis of SARS-CoV-2 by FDA under an Emergency Use Authorization (EUA). This EUA will remain  in effect (meaning this test can be used) for the duration of the COVID-19 declaration under Section 564(b)(1) of the Act, 21 U.S.C.section 360bbb-3(b)(1), unless the authorization is terminated  or revoked sooner.       Influenza A by PCR NEGATIVE NEGATIVE   Influenza B by PCR NEGATIVE NEGATIVE    Comment: (NOTE) The Xpert Xpress SARS-CoV-2/FLU/RSV plus assay is intended as an aid in the diagnosis of influenza from Nasopharyngeal swab specimens and should not be used as a sole basis for treatment. Nasal washings and aspirates are unacceptable for Xpert Xpress SARS-CoV-2/FLU/RSV testing.  Fact Sheet for Patients: BloggerCourse.com  Fact Sheet for Healthcare Providers: SeriousBroker.it  This test is not yet approved or cleared by the [place] FDA and has been authorized for detection and/or diagnosis of SARS-CoV-2 by FDA under an Emergency Use Authorization (EUA). This EUA will remain in effect (meaning this test can be used) for the duration of the COVID-19  declaration under Section 564(b)(1) of the Act, 21 U.S.C. section 360bbb-3(b)(1), unless the authorization is terminated or revoked.  Performed at [facility], [address], [place], [place] [address]     Current Facility-Administered Medications  Medication Dose Route Frequency Provider Last Rate Last Admin  .  atorvastatin (LIPITOR) tablet 80 mg  80 mg Oral Daily Tani Virgo T, MD      . docusate sodium (COLACE) capsule 100 mg  100 mg Oral BID Ajwa Kimberley T, MD      . levETIRAcetam (KEPPRA) tablet 500 mg  500 mg Oral BID Glennette Galster T, MD      . LORazepam (ATIVAN) injection 0-4 mg  0-4 mg Intravenous Q6H Merwyn KatosBradler, Evan K, MD       Or  . LORazepam (ATIVAN) tablet 0-4 mg  0-4 mg Oral Q6H Merwyn KatosBradler, Evan K, MD   2 mg at 12/21/20 0836  . [START ON 12/23/2020] LORazepam (ATIVAN) injection 0-4 mg  0-4 mg Intravenous Q12H Merwyn KatosBradler, Evan K, MD       Or  . Melene Muller[START ON 12/23/2020] LORazepam (ATIVAN) tablet 0-4 mg  0-4 mg Oral Q12H Merwyn KatosBradler, Evan K, MD      . ondansetron (ZOFRAN-ODT) disintegrating tablet 4 mg  4 mg Oral Q8H PRN Merwyn KatosBradler, Evan K, MD   4 mg at 12/21/20 0749  . pantoprazole (PROTONIX) EC tablet 40 mg  40 mg Oral Daily Renesmae Donahey T, MD      . thiamine tablet 100 mg  100 mg Oral Daily Bradler, Clent JacksEvan K, MD       Or  . thiamine (B-1) injection 100 mg  100 mg Intravenous Daily Merwyn KatosBradler, Evan K, MD       Current Outpatient Medications  Medication Sig Dispense Refill  . atorvastatin (LIPITOR) 80 MG tablet TOME UNA TABLETA TODOS Oneal DIAS 90 tablet 1  . citalopram (CELEXA) 10 MG tablet TAKE 1 TABLET BY MOUTH EVERYDAY AT BEDTIME 90 tablet 1  . clopidogrel (PLAVIX) 75 MG tablet TOME UNA TABLETA TODOS Oneal DIAS 90 tablet 1  . docusate sodium (COLACE) 100 MG capsule TAKE 1 CAPSULE (100 MG TOTAL) BY MOUTH 2 (TWO) TIMES DAILY. 60 capsule 0  . levETIRAcetam (KEPPRA) 500 MG tablet Take 1 tablet (500 mg total) by mouth 2 (two) times daily. 120 tablet 3  . Multiple Vitamin (MULTIVITAMIN  WITH MINERALS) TABS tablet Take 1 tablet by mouth daily. 30 tablet 0  . pantoprazole (PROTONIX) 40 MG tablet TAKE 1 TABLET (40 MG TOTAL) BY MOUTH DAILY AT 12 NOON. 90 tablet 1  . senna-docusate (SENOKOT-S) 8.6-50 MG tablet Take 1 tablet by mouth 2 (two) times daily.      Musculoskeletal: Strength & Muscle Tone: within normal limits Gait & Station: normal Patient leans: N/A            Psychiatric Specialty Exam:  Presentation  General Appearance: Disheveled  Eye Contact:Minimal  Speech:Garbled; Slow; Slurred  Speech Volume:Normal  Handedness:Right   Mood and Affect  Mood:Irritable; Dysphoric  Affect:Blunt; Congruent; Depressed; Inappropriate   Thought Process  Thought Processes:Coherent  Descriptions of Associations:Intact  Orientation:Full (Time, Place and Person)  Thought Content:Logical; WDL  History of Schizophrenia/Schizoaffective disorder:No  Duration of Psychotic Symptoms:No data recorded Hallucinations:Hallucinations: None  Ideas of Reference:None  Suicidal Thoughts:Suicidal Thoughts: Yes, Active SI Active Intent and/or Plan: With Intent; With Plan; With Means to Carry Out; With Access to Means  Homicidal Thoughts:No data recorded  Sensorium  Memory:Immediate Good; Recent Good; Remote Good  Judgment:Poor  Insight:Poor   Executive Functions  Concentration:Poor  Attention Span:Fair  Recall:No data recorded Fund of Knowledge:Fair  Language:Fair   Psychomotor Activity  Psychomotor Activity:Psychomotor Activity: Extrapyramidal Side Effects (EPS); Tremor Extrapyramidal Side Effects (EPS): Akathisia AIMS Completed?: No   Assets  Assets:Communication Skills; Desire for Improvement; Housing; Intimacy;  Resilience; Social Support   Sleep  Sleep:Sleep: Good   Physical Exam: Physical Exam Vitals and nursing note reviewed.  Constitutional:      Appearance: Normal appearance.  HENT:     Head: Normocephalic and atraumatic.      Mouth/Throat:     Pharynx: Oropharynx is clear.  Eyes:     Pupils: Pupils are equal, round, and reactive to light.  Cardiovascular:     Rate and Rhythm: Normal rate and regular rhythm.  Pulmonary:     Effort: Pulmonary effort is normal.     Breath sounds: Normal breath sounds.  Abdominal:     General: Abdomen is flat.     Palpations: Abdomen is soft.  Musculoskeletal:        General: Normal range of motion.  Skin:    General: Skin is warm and dry.  Neurological:     General: No focal deficit present.     Mental Status: He is alert. Mental status is at baseline.  Psychiatric:        Attention and Perception: He is inattentive.        Mood and Affect: Mood normal. Affect is labile.        Speech: Speech is tangential.        Behavior: Behavior is agitated. Behavior is not aggressive or hyperactive.        Thought Content: Thought content includes suicidal ideation. Thought content does not include suicidal plan.        Cognition and Memory: Cognition is impaired. Memory is impaired.        Judgment: Judgment is impulsive.    Review of Systems  Constitutional: Negative.   HENT: Negative.   Eyes: Negative.   Respiratory: Negative.   Cardiovascular: Negative.   Gastrointestinal: Negative.   Musculoskeletal: Negative.   Skin: Negative.   Neurological: Negative.   Psychiatric/Behavioral: Positive for depression, memory loss, substance abuse and suicidal ideas. Negative for hallucinations. The patient is nervous/anxious and has insomnia.    Blood pressure 121/85, pulse 79, temperature 98 F (36.7 C), temperature source Oral, resp. rate 16, height 5\' 3"  (1.6 m), weight 68 kg, SpO2 100 %. Body mass index is 26.56 kg/m.  Treatment Plan Summary: Medication management and Plan 35 year old man presented intoxicated and agitated with family concerned about suicidal statements and behavior.  Labs largely unremarkable.  No evidence of acute overdose.  Patient has been started back on  his Keppra and other outpatient medications.  Detox protocol orders are in place.  I attempted to contact his family using an interpreter but had to just leave a message since no one answered.  Plan for now will be for admission to the inpatient psychiatric ward.  Orders will be prepared including detox.  Disposition: Recommend psychiatric Inpatient admission when medically cleared. Supportive therapy provided about ongoing stressors.  31, MD 12/21/2020 1:14 PM

## 2020-12-21 NOTE — H&P (Signed)
Psychiatric Admission Assessment Adult  Patient Identification: Mark Oneal MRN:  [id number] Date of Evaluation:  12/21/2020 Chief Complaint:  Severe major depression, single episode, without psychotic features (HCC) [F32.2] Principal Diagnosis: Severe major depression, single episode, without psychotic features (HCC) Diagnosis:  Principal Problem:   Severe major depression, single episode, without psychotic features (HCC) Active Problems:   History of CVA with residual deficit   Seizures (HCC)   Hyperlipidemia LDL goal <70   Alcohol abuse  CC "When can I go home?"  History of Present Illness: 35 year old male IVC by family due to suicidal ideations, heavy alcohol use, and depression. Patient admitted to making suicidal statements to several providers in the emergency room, but on exam today denies that he was ever feeling suicidal. He also minimizes his symptoms of depression and anxiety that he expressed in the emergency room. He does admit that he has been noncompliant with medications at home, and does admit that alcohol has become problematic in his life. He minimizes alcohol use stating he drinks roughly 5-6 beers per day, alcohol level 227 on admission. He states that his drinking is interfering with work, and with his family. He hopes to stop drinking for good to be there for his wife, daughter, and son. He is open to starting Naltrexone to curb alcohol cravings.   Associated Signs/Symptoms: Depression Symptoms:  depressed mood, insomnia, difficulty concentrating, suicidal thoughts without plan, Duration of Depression Symptoms: Greater than two weeks  (Hypo) Manic Symptoms:  Impulsivity, Anxiety Symptoms:  Excessive Worry, Psychotic Symptoms:  Denies PTSD Symptoms: Negative Total Time spent with patient: 1 hour  Past Psychiatric History: Unknown past psychiatric history.  No evidence of past treatment for depression or substance abuse.  Denies past suicide attempts.  Is the  patient at risk to self? Yes.    Has the patient been a risk to self in the past 6 months? Yes.    Has the patient been a risk to self within the distant past? No.  Is the patient a risk to others? No.  Has the patient been a risk to others in the past 6 months? No.  Has the patient been a risk to others within the distant past? No.   Prior Inpatient Therapy:   Prior Outpatient Therapy:    Alcohol Screening:   Substance Abuse History in the last 12 months:  Yes.   Consequences of Substance Abuse: Withdrawal Symptoms:   Headaches Nausea Tremors Previous Psychotropic Medications: No  Psychological Evaluations: Yes  Past Medical History:  Past Medical History:  Diagnosis Date  . 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 BUBBLE STUDY  07/06/2020   Procedure: BUBBLE STUDY;  Surgeon: Little Ishikawa, MD;  Location: [facility] ENDOSCOPY;  Service: Cardiovascular;;  . IR ANGIO INTRA EXTRACRAN SEL COM CAROTID INNOMINATE UNI L MOD SED  07/03/2020  . IR CT HEAD LTD  07/03/2020  . IR CT HEAD LTD  07/03/2020  . IR PERCUTANEOUS ART THROMBECTOMY/INFUSION INTRACRANIAL INC DIAG ANGIO  07/03/2020  . IR US GUIDE VASC ACCESS RIGHT  07/03/2020  . RADIOLOGY WITH ANESTHESIA N/A 07/03/2020   Procedure: IR WITH ANESTHESIA;  Surgeon: Radiologist, Medication, MD;  Location: [facility] OR;  Service: Radiology;  Laterality: N/A;  . TEE WITHOUT CARDIOVERSION N/A 07/06/2020   Procedure: TRANSESOPHAGEAL ECHOCARDIOGRAM (TEE);  Surgeon: Little Ishikawa, MD;  Location: [facility] ENDOSCOPY;  Service: Cardiovascular;  Laterality: N/A;   Family History:  Family History  Problem  Relation Age of Onset  . Hypertension Mother   . Hypertension Father    Family Psychiatric  History: Denies Tobacco Screening:   Social History:  Social History   Substance and Sexual Activity  Alcohol Use Yes  . Alcohol/week: 3.0 standard drinks  . Types: 3 Cans of beer per  week   Comment: 20 ounce cans     Social History   Substance and Sexual Activity  Drug Use Not Currently    Additional Social History:                           Allergies:  No Known Allergies Lab Results:  Results for orders placed or performed during the hospital encounter of 12/20/20 (from the past 48 hour(s))  Comprehensive metabolic panel     Status: Abnormal   Collection Time: 12/20/20 11:14 PM  Result Value Ref Range   Sodium 140 135 - 145 mmol/L   Potassium 3.6 3.5 - 5.1 mmol/L   Chloride 104 98 - 111 mmol/L   CO2 25 22 - 32 mmol/L   Glucose, Bld 97 70 - 99 mg/dL    Comment: Glucose reference range applies only to samples taken after fasting for at least 8 hours.   BUN 8 6 - 20 mg/dL   Creatinine, Ser 7.42 0.61 - 1.24 mg/dL   Calcium 9.1 8.9 - 59.5 mg/dL   Total Protein 7.9 6.5 - 8.1 g/dL   Albumin 4.6 3.5 - 5.0 g/dL   AST 98 (H) 15 - 41 U/L   ALT 48 (H) 0 - 44 U/L   Alkaline Phosphatase 104 38 - 126 U/L   Total Bilirubin 1.1 0.3 - 1.2 mg/dL   GFR, Estimated >63 >87 mL/min    Comment: (NOTE) Calculated using the CKD-EPI Creatinine Equation (2021)    Anion gap 11 5 - 15    Comment: Performed at [facility], [address] [place], [place] [address]  Ethanol     Status: Abnormal   Collection Time: 12/20/20 11:14 PM  Result Value Ref Range   Alcohol, Ethyl (B) 227 (H) <10 mg/dL    Comment: (NOTE) Lowest detectable limit for serum alcohol is 10 mg/dL.  For medical purposes only. Performed at [facility], [address] [place], [place] [address]   Salicylate level     Status: Abnormal   Collection Time: 12/20/20 11:14 PM  Result Value Ref Range   Salicylate Lvl <7.0 (L) 7.0 - 30.0 mg/dL    Comment: Performed at [facility], [address] [place], [place] [address]  Acetaminophen level     Status: Abnormal   Collection Time: 12/20/20 11:14 PM  Result Value Ref Range   Acetaminophen (Tylenol), Serum <10 (L) 10 -  30 ug/mL    Comment: (NOTE) Therapeutic concentrations vary significantly. A range of 10-30 ug/mL  may be an effective concentration for many patients. However, some  are best treated at concentrations outside of this range. Acetaminophen concentrations >150 ug/mL at 4 hours after ingestion  and >50 ug/mL at 12 hours after ingestion are often associated with  toxic reactions.  Performed at [facility], [address] [place], [place] [address]   cbc     Status: None   Collection Time: 12/20/20 11:14 PM  Result Value Ref Range   WBC 7.7 4.0 - 10.5 K/uL   RBC 4.78 4.22 - 5.81 MIL/uL   Hemoglobin 14.7 13.0 - 17.0 g/dL  HCT 41.7 39.0 - 52.0 %   MCV 87.2 80.0 - 100.0 fL   MCH 30.8 26.0 - 34.0 pg   MCHC 35.3 30.0 - 36.0 g/dL   RDW 54.0 98.1 - 19.1 %   Platelets 186 150 - 400 K/uL   nRBC 0.0 0.0 - 0.2 %    Comment: Performed at [facility], [address] [place], [place] [address]  Resp Panel by RT-PCR (Flu A&B, Covid) Nasopharyngeal Swab     Status: None   Collection Time: 12/21/20 12:00 AM   Specimen: Nasopharyngeal Swab; Nasopharyngeal(NP) swabs in vial transport medium  Result Value Ref Range   SARS Coronavirus 2 by RT PCR NEGATIVE NEGATIVE    Comment: (NOTE) SARS-CoV-2 target nucleic acids are NOT DETECTED.  The SARS-CoV-2 RNA is generally detectable in upper respiratory specimens during the acute phase of infection. The lowest concentration of SARS-CoV-2 viral copies this assay can detect is 138 copies/mL. A negative result does not preclude SARS-Cov-2 infection and should not be used as the sole basis for treatment or other patient management decisions. A negative result may occur with  improper specimen collection/handling, submission of specimen other than nasopharyngeal swab, presence of viral mutation(s) within the areas targeted by this assay, and inadequate number of viral copies(<138 copies/mL). A negative result must be combined  with clinical observations, patient history, and epidemiological information. The expected result is Negative.  Fact Sheet for Patients:  BloggerCourse.com  Fact Sheet for Healthcare Providers:  SeriousBroker.it  This test is no t yet approved or cleared by the [place] FDA and  has been authorized for detection and/or diagnosis of SARS-CoV-2 by FDA under an Emergency Use Authorization (EUA). This EUA will remain  in effect (meaning this test can be used) for the duration of the COVID-19 declaration under Section 564(b)(1) of the Act, 21 U.S.C.section 360bbb-3(b)(1), unless the authorization is terminated  or revoked sooner.       Influenza A by PCR NEGATIVE NEGATIVE   Influenza B by PCR NEGATIVE NEGATIVE    Comment: (NOTE) The Xpert Xpress SARS-CoV-2/FLU/RSV plus assay is intended as an aid in the diagnosis of influenza from Nasopharyngeal swab specimens and should not be used as a sole basis for treatment. Nasal washings and aspirates are unacceptable for Xpert Xpress SARS-CoV-2/FLU/RSV testing.  Fact Sheet for Patients: BloggerCourse.com  Fact Sheet for Healthcare Providers: SeriousBroker.it  This test is not yet approved or cleared by the [place] FDA and has been authorized for detection and/or diagnosis of SARS-CoV-2 by FDA under an Emergency Use Authorization (EUA). This EUA will remain in effect (meaning this test can be used) for the duration of the COVID-19 declaration under Section 564(b)(1) of the Act, 21 U.S.C. section 360bbb-3(b)(1), unless the authorization is terminated or revoked.  Performed at [facility], [address] [place], [place] [address]     Blood Alcohol level:  Lab Results  Component Value Date   ETH 227 (H) 12/20/2020   ETH 277 (H) 12/19/2020    Metabolic Disorder Labs:  Lab Results  Component Value Date    HGBA1C 5.6 07/04/2020   MPG 114 07/04/2020   No results found for: PROLACTIN Lab Results  Component Value Date   CHOL 156 10/17/2020   TRIG 120 10/17/2020   HDL 43 10/17/2020   CHOLHDL 3.6 10/17/2020   VLDL UNABLE TO CALCULATE IF TRIGLYCERIDE OVER 400 mg/dL 08/65/7846   LDLCALC 91 10/17/2020   LDLCALC UNABLE TO CALCULATE IF TRIGLYCERIDE OVER 400 mg/dL  07/04/2020    Current Medications: Current Facility-Administered Medications  Medication Dose Route Frequency Provider Last Rate Last Admin  . acetaminophen (TYLENOL) tablet 650 mg  650 mg Oral Q6H PRN Clapacs, John T, MD      . alum & mag hydroxide-simeth (MAALOX/MYLANTA) 200-200-20 MG/5ML suspension 30 mL  30 mL Oral Q4H PRN Clapacs, Jackquline Denmark, MD      . Melene Muller ON 12/22/2020] atorvastatin (LIPITOR) tablet 80 mg  80 mg Oral Daily Clapacs, John T, MD      . citalopram (CELEXA) tablet 10 mg  10 mg Oral QHS Jesse Sans, MD      . Melene Muller ON 12/22/2020] clopidogrel (PLAVIX) tablet 75 mg  75 mg Oral Daily Jesse Sans, MD      . Melene Muller ON 12/22/2020] docusate sodium (COLACE) capsule 100 mg  100 mg Oral BID Clapacs, Jackquline Denmark, MD      . Melene Muller ON 12/22/2020] levETIRAcetam (KEPPRA) tablet 500 mg  500 mg Oral BID Clapacs, John T, MD      . LORazepam (ATIVAN) tablet 1-4 mg  1-4 mg Oral Q1H PRN Jesse Sans, MD   2 mg at 12/21/20 1609   Or  . LORazepam (ATIVAN) injection 1-4 mg  1-4 mg Intravenous Q1H PRN Jesse Sans, MD      . magnesium hydroxide (MILK OF MAGNESIA) suspension 30 mL  30 mL Oral Daily PRN Clapacs, Jackquline Denmark, MD      . Melene Muller ON 12/22/2020] naltrexone (DEPADE) tablet 50 mg  50 mg Oral Daily Jesse Sans, MD      . ondansetron (ZOFRAN-ODT) disintegrating tablet 4 mg  4 mg Oral Q8H PRN Clapacs, Jackquline Denmark, MD      . Melene Muller ON 12/22/2020] pantoprazole (PROTONIX) EC tablet 40 mg  40 mg Oral Daily Clapacs, Jackquline Denmark, MD      . Melene Muller ON 12/22/2020] thiamine tablet 100 mg  100 mg Oral Daily Clapacs, Jackquline Denmark, MD       Or  . Melene Muller ON 12/22/2020]  thiamine (B-1) injection 100 mg  100 mg Intravenous Daily Clapacs, Jackquline Denmark, MD       PTA Medications: Medications Prior to Admission  Medication Sig Dispense Refill Last Dose  . atorvastatin (LIPITOR) 80 MG tablet TOME UNA TABLETA TODOS LOS DIAS 90 tablet 1   . citalopram (CELEXA) 10 MG tablet TAKE 1 TABLET BY MOUTH EVERYDAY AT BEDTIME 90 tablet 1   . clopidogrel (PLAVIX) 75 MG tablet TOME UNA TABLETA TODOS LOS DIAS 90 tablet 1   . docusate sodium (COLACE) 100 MG capsule TAKE 1 CAPSULE (100 MG TOTAL) BY MOUTH 2 (TWO) TIMES DAILY. 60 capsule 0   . levETIRAcetam (KEPPRA) 500 MG tablet Take 1 tablet (500 mg total) by mouth 2 (two) times daily. 120 tablet 3   . Multiple Vitamin (MULTIVITAMIN WITH MINERALS) TABS tablet Take 1 tablet by mouth daily. 30 tablet 0   . pantoprazole (PROTONIX) 40 MG tablet TAKE 1 TABLET (40 MG TOTAL) BY MOUTH DAILY AT 12 NOON. 90 tablet 1   . senna-docusate (SENOKOT-S) 8.6-50 MG tablet Take 1 tablet by mouth 2 (two) times daily.       Musculoskeletal: Strength & Muscle Tone: within normal limits Gait & Station: normal Patient leans: N/A            Psychiatric Specialty Exam:  Presentation  General Appearance: Disheveled  Eye Contact:Fair  Speech:Normal Rate  Speech Volume:Increased  Handedness:Right   Mood and Affect  Mood:Anxious  Affect:Congruent   Thought Process  Thought Processes:Coherent  Duration of Psychotic Symptoms: No data recorded Past Diagnosis of Schizophrenia or Psychoactive disorder: No  Descriptions of Associations:Intact  Orientation:Full (Time, Place and Person)  Thought Content:Logical  Hallucinations:Hallucinations: None  Ideas of Reference:None  Suicidal Thoughts:Suicidal Thoughts: -- (Patient denies on exam, however, endorsed active suicidal ideations this morning) SI Active Intent and/or Plan: With Intent; With Plan; With Means to Carry Out; With Access to Means  Homicidal Thoughts:Homicidal Thoughts:  No   Sensorium  Memory:Immediate Fair; Recent Fair; Remote Fair  Judgment:Impaired  Insight:Lacking   Executive Functions  Concentration:Poor  Attention Span:Poor  Recall:Poor  Fund of Knowledge:Fair  Language:Fair   Psychomotor Activity  Psychomotor Activity:Psychomotor Activity: Tremor Extrapyramidal Side Effects (EPS): Akathisia AIMS Completed?: No   Assets  Assets:Desire for Improvement; Resilience; Social Support   Sleep  Sleep:Sleep: Good    Physical Exam: Physical Exam Vitals and nursing note reviewed.  Constitutional:      Appearance: Normal appearance.  HENT:     Head: Normocephalic and atraumatic.     Right Ear: External ear normal.     Left Ear: External ear normal.     Nose: Nose normal.     Mouth/Throat:     Mouth: Mucous membranes are moist.     Pharynx: Oropharynx is clear.  Eyes:     Extraocular Movements: Extraocular movements intact.     Conjunctiva/sclera: Conjunctivae normal.     Pupils: Pupils are equal, round, and reactive to light.  Cardiovascular:     Rate and Rhythm: Tachycardia present.     Pulses: Normal pulses.  Pulmonary:     Effort: Pulmonary effort is normal.     Breath sounds: Normal breath sounds.  Abdominal:     General: Abdomen is flat.     Palpations: Abdomen is soft.  Musculoskeletal:        General: No swelling. Normal range of motion.     Cervical back: Normal range of motion and neck supple.  Skin:    General: Skin is warm and dry.  Neurological:     General: No focal deficit present.     Mental Status: He is alert and oriented to person, place, and time.  Psychiatric:        Attention and Perception: Attention and perception normal.        Mood and Affect: Mood is anxious.        Speech: Speech normal.        Behavior: Behavior is cooperative.        Thought Content: Thought content does not include homicidal or suicidal ideation.        Cognition and Memory: Cognition is impaired. Memory is  impaired.        Judgment: Judgment is impulsive.    Review of Systems  Constitutional: Positive for malaise/fatigue. Negative for weight loss.  HENT: Negative for congestion and sore throat.   Eyes: Negative for blurred vision and double vision.  Respiratory: Negative for cough and shortness of breath.   Cardiovascular: Negative for chest pain and palpitations.  Gastrointestinal: Positive for nausea. Negative for constipation and diarrhea.  Genitourinary: Negative for dysuria and urgency.  Musculoskeletal: Negative for back pain and myalgias.  Skin: Negative for itching and rash.  Neurological: Positive for tingling and tremors. Negative for dizziness and headaches.  Endo/Heme/Allergies: Negative for environmental allergies. Does not bruise/bleed easily.  Psychiatric/Behavioral: Positive for depression and substance abuse. The patient is nervous/anxious and has insomnia.  Blood pressure 128/86, pulse (!) 116, temperature 98.4 F (36.9 C), temperature source Oral, resp. rate 18, height 5\' 3"  (1.6 m), weight 75.3 kg, SpO2 99 %. Body mass index is 29.41 kg/m.  Treatment Plan Summary: Daily contact with patient to assess and evaluate symptoms and progress in treatment and Medication management   1) MDD, single episode, severe without psychotic features- new problem, unstable, suicidal ideations at home - Start Celexa 10 mg daily  2) Severe alcohol use disorder- severe problem, unstable - Start Natrexone 50 mg daily for alcohol cravings - CIWA protocol with PRN Ativan in place - MVI, folic acid, thiamine  3) History of CVA with residual anxiety and decrease left hand dexterity  - Continue Plavix 75 mg daily  4) HLD - Continue Lipitor 80 mg nightly  5) Seizure disorder - Continue Keppra 500 mg BID   Observation Level/Precautions:  Detox  Laboratory:  Completed in ED  Psychotherapy:    Medications:    Consultations:    Discharge Concerns:    Estimated LOS:  Other:      Physician Treatment Plan for Primary Diagnosis: Severe major depression, single episode, without psychotic features (HCC) Long Term Goal(s): Improvement in symptoms so as ready for discharge  Short Term Goals: Ability to identify changes in lifestyle to reduce recurrence of condition will improve, Ability to verbalize feelings will improve, Ability to disclose and discuss suicidal ideas, Ability to demonstrate self-control will improve, Ability to identify and develop effective coping behaviors will improve, Ability to maintain clinical measurements within normal limits will improve, Compliance with prescribed medications will improve and Ability to identify triggers associated with substance abuse/mental health issues will improve  Physician Treatment Plan for Secondary Diagnosis: Principal Problem:   Severe major depression, single episode, without psychotic features (HCC) Active Problems:   History of CVA with residual deficit   Seizures (HCC)   Hyperlipidemia LDL goal <70   Alcohol abuse  Long Term Goal(s): Improvement in symptoms so as ready for discharge  Short Term Goals: Ability to identify changes in lifestyle to reduce recurrence of condition will improve, Ability to verbalize feelings will improve, Ability to disclose and discuss suicidal ideas, Ability to demonstrate self-control will improve, Ability to identify and develop effective coping behaviors will improve, Ability to maintain clinical measurements within normal limits will improve, Compliance with prescribed medications will improve and Ability to identify triggers associated with substance abuse/mental health issues will improve  I certify that inpatient services furnished can reasonably be expected to improve the patient's condition.    Jesse SansMegan M Hagan Vanauken, MD 4/7/20224:29 PM

## 2020-12-21 NOTE — BH Assessment (Signed)
Comprehensive Clinical Assessment (CCA) Note  12/21/2020 Mark Oneal [id number]  Chief Complaint: Patient is a 35 year old male presenting to [facility] ED under IVC. Per triage note Pt to ED via BPD under IVC with papers that state pt has hx of strokes, brain damage d/t strokes, not taking medications regularly, stated SI at home and stopped by family member, has been drinking excessively for 5 days.  Upon arrival pt denies SI/HI, A/V hallucinations.  States has been drinking recently, 5 12oz cans of beer today, denies drugs. During assessment patient appears alert and oriented x4, cooperative but not very forthcoming with information regarding why he feels suicidal. Patient continues to report SI. Patient does admit that he needs help. Patient reports drinking "5-6 beers" last night. Patient presented to ED on 12/19/20 due to alcohol intoxication and requesting the need for help but patient decided to leave a couple of hours afterwards.   Per Psyc NP Elenore Paddy patient is recommended for Inpatient Hospitalization  Chief Complaint  Patient presents with  . Alcohol Problem  . Psychiatric Evaluation   Visit Diagnosis: Alcohol Abuse, Substance-Induced Mood Disorder   CCA Screening, Triage and Referral (STR)  Patient Reported Information How did you hear about [place]? Legal System  Referral name: No data recorded Referral phone number: No data recorded  Whom do you see for routine medical problems? Other (Comment)  Practice/Facility Name: No data recorded Practice/Facility Phone Number: No data recorded Name of Contact: No data recorded Contact Number: No data recorded Contact Fax Number: No data recorded Prescriber Name: No data recorded Prescriber Address (if known): No data recorded  What Is the Reason for Your Visit/Call Today? No data recorded How Long Has This Been Causing You Problems? 1-6 months  What Do You Feel Would Help You the Most Today? Alcohol or Drug Use Treatment;  Treatment for Depression or other mood problem   Have You Recently Been in Any Inpatient Treatment (Hospital/Detox/Crisis Center/28-Day Program)? No  Name/Location of Program/Hospital:No data recorded How Long Were You There? No data recorded When Were You Discharged? No data recorded  Have You Ever Received Services From [facility] Before? No  Who Do You See at [facility]? No data recorded  Have You Recently Had Any Thoughts About Hurting Yourself? Yes  Are You Planning to Commit Suicide/Harm Yourself At This time? Yes   Have you Recently Had Thoughts About Hurting Someone Karolee Ohs? No  Explanation: No data recorded  Have You Used Any Alcohol or Drugs in the Past 24 Hours? Yes  How Long Ago Did You Use Drugs or Alcohol? No data recorded What Did You Use and How Much? Alcohol "5-6 beers"   Do You Currently Have a Therapist/Psychiatrist? No  Name of Therapist/Psychiatrist: No data recorded  Have You Been Recently Discharged From Any Office Practice or Programs? No  Explanation of Discharge From Practice/Program: No data recorded    CCA Screening Triage Referral Assessment Type of Contact: Face-to-Face  Is this Initial or Reassessment? No data recorded Date Telepsych consult ordered in [facility]:  No data recorded Time Telepsych consult ordered in [facility]:  No data recorded  Patient Reported Information Reviewed? Yes  Patient Left Without Being Seen? No data recorded Reason for Not Completing Assessment: No data recorded  Collateral Involvement: No data recorded  Does Patient Have a Court Appointed Legal Guardian? No data recorded Name and Contact of Legal Guardian: No data recorded If Minor and Not Living with Parent(s), Who has Custody? No data recorded Is  CPS involved or ever been involved? Never  Is APS involved or ever been involved? Never   Patient Determined To Be At Risk for Harm To Self or Others Based on Review of Patient Reported Information or Presenting  Complaint? Yes, for Self-Harm  Method: No data recorded Availability of Means: No data recorded Intent: No data recorded Notification Required: No data recorded Additional Information for Danger to Others Potential: No data recorded Additional Comments for Danger to Others Potential: No data recorded Are There Guns or Other Weapons in Your Home? No data recorded Types of Guns/Weapons: No data recorded Are These Weapons Safely Secured?                            No data recorded Who Could Verify You Are Able To Have These Secured: No data recorded Do You Have any Outstanding Charges, Pending Court Dates, Parole/Probation? No data recorded Contacted To Inform of Risk of Harm To Self or Others: No data recorded  Location of Assessment: [facility] ED   Does Patient Present under Involuntary Commitment? Yes  IVC Papers Initial File Date: 12/21/2020   [place] of Residence: [place]   Patient Currently Receiving the Following Services: No data recorded  Determination of Need: Emergent (2 hours)   Options For Referral: No data recorded    CCA Biopsychosocial Intake/Chief Complaint:  Patient presents to the ED under IVC due to being intoxicated with SI and an attempt to hurt himself  Current Symptoms/Problems: Patient presents to the ED under IVC due to being intoxicated with SI and an attempt to hurt himself   Patient Reported Schizophrenia/Schizoaffective Diagnosis in Past: No   Strengths: Patient is able to communicate  Preferences: Unknown  Abilities: Unknown   Type of Services Patient Feels are Needed: Unknown   Initial Clinical Notes/Concerns: None   Mental Health Symptoms Depression:  Change in energy/activity; Hopelessness; Increase/decrease in appetite; Sleep (too much or little)   Duration of Depressive symptoms: Greater than two weeks   Mania:  None   Anxiety:   None   Psychosis:  None   Duration of Psychotic symptoms: No data recorded  Trauma:  None    Obsessions:  None   Compulsions:  None   Inattention:  None   Hyperactivity/Impulsivity:  N/A   Oppositional/Defiant Behaviors:  None   Emotional Irregularity:  None   Other Mood/Personality Symptoms:  No data recorded   Mental Status Exam Appearance and self-care  Stature:  Average   Weight:  Average weight   Clothing:  Disheveled   Grooming:  Neglected   Cosmetic use:  None   Posture/gait:  Normal   Motor activity:  Not Remarkable   Sensorium  Attention:  Normal   Concentration:  Normal   Orientation:  X5   Recall/memory:  Normal   Affect and Mood  Affect:  Depressed   Mood:  Depressed   Relating  Eye contact:  Avoided   Facial expression:  Responsive   Attitude toward examiner:  Cooperative   Thought and Language  Speech flow: Slurred   Thought content:  Appropriate to Mood and Circumstances   Preoccupation:  None   Hallucinations:  None   Organization:  No data recorded  [employer] of Knowledge:  Fair   Intelligence:  Average   Abstraction:  Normal   Judgement:  Impaired   Reality Testing:  Realistic   Insight:  Lacking; Poor   Decision Making:  Impulsive  Social Functioning  Social Maturity:  Isolates   Social Judgement:  Normal   Stress  Stressors:  Other (Comment)   Coping Ability:  Exhausted   Skill Deficits:  None   Supports:  Family     Religion: Religion/Spirituality Are You A Religious Person?: No  Leisure/Recreation: Leisure / Recreation Do You Have Hobbies?: No  Exercise/Diet: Exercise/Diet Do You Exercise?: No Have You Gained or Lost A Significant Amount of Weight in the Past Six Months?: No Do You Follow a Special Diet?: No Do You Have Any Trouble Sleeping?: No   CCA Employment/Education Employment/Work Situation: Employment / Work Situation Employment situation: Employed Where is patient currently employed?: Unknown How long has patient been employed?: Unknown Has  patient ever been in the [employer] and Companymilitary?: No  Education: Education Is Patient Currently Attending School?: No Did Garment/textile technologistYou Graduate From [employer]?:  (UTA)   CCA Family/Childhood History Family and Relationship History: Family history Marital status: Married What types of issues is patient dealing with in the relationship?: Unknown Additional relationship information: Unknown Are you sexually active?:  (Unknown) What is your sexual orientation?: Heterosexual Has your sexual activity been affected by drugs, alcohol, medication, or emotional stress?: Unknown Does patient have children?:  (Did not report)  Childhood History:  Childhood History By whom was/is the patient raised?:  (Unknown) Additional childhood history information: None reported Description of patient's relationship with caregiver when they were a child: None reported Patient's description of current relationship with people who raised him/her: None reported How were you disciplined when you got in trouble as a child/adolescent?: None reported Does patient have siblings?:  (UTA) Did patient suffer any verbal/emotional/physical/sexual abuse as a child?:  (UTA) Did patient suffer from severe childhood neglect?:  (UTA) Has patient ever been sexually abused/assaulted/raped as an adolescent or adult?:  (UTA) Was the patient ever a victim of a crime or a disaster?:  (UTA) Witnessed domestic violence?:  (UTA) Has patient been affected by 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)  Child/Adolescent Assessment:     CCA Substance Use Alcohol/Drug Use: Alcohol / Drug Use Pain Medications: See MAR Prescriptions: See MAR Over the Counter: See MAR History of alcohol / drug use?: Yes Substance #1 Name of Substance 1: Alcohol 1 - Amount (size/oz): "5-6 beers" 1 - Frequency: daily 1- Route of Use: Oral                       ASAM's:  Six Dimensions of Multidimensional Assessment  Dimension 1:  Acute Intoxication and/or  Withdrawal Potential:      Dimension 2:  Biomedical Conditions and Complications:      Dimension 3:  Emotional, Behavioral, or Cognitive Conditions and Complications:     Dimension 4:  Readiness to Change:     Dimension 5:  Relapse, Continued use, or Continued Problem Potential:     Dimension 6:  Recovery/Living Environment:     ASAM Severity Score:    ASAM Recommended Level of Treatment:     Substance use Disorder (SUD) Substance Use Disorder (SUD)  Checklist Symptoms of Substance Use: Continued use despite having a persistent/recurrent physical/psychological problem caused/exacerbated by use,Evidence of tolerance,Presence of craving or strong urge to use,Social, occupational, recreational activities given up or reduced due to use,Evidence of withdrawal (Comment),Recurrent use that results in a failure to fulfill major role obligations (work, school, home),Large amounts of time spent to obtain, use or recover from the substance(s),Continued use despite persistent or recurrent social, interpersonal problems, caused or  exacerbated by use,Persistent desire or unsuccessful efforts to cut down or control use,Repeated use in physically hazardous situations,Substance(s) often taken in larger amounts or over longer times than was intended  Recommendations for Services/Supports/Treatments:   Per Psyc NP Elenore Paddy patient is recommended for Inpatient Hospitalization   DSM5 Diagnoses: Patient Active Problem List   Diagnosis Date Noted  . 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 Elevated liver function tests 08/02/2020  . Hemi-inattention--left 07/21/2020  . Acute ischemic right MCA stroke (HCC) 07/12/2020  . Dysphagia, post-stroke   . Aspiration pneumonia (HCC)   . Seizures (HCC) 07/07/2020  . Hyperlipidemia LDL goal <70 07/07/2020  . 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 Obesity 07/07/2020  . Leukocytosis 07/07/2020  . History of CVA with residual deficit   . Cerebral  infarction due to embolism of R ICA, R MCA vs dissection (HCC) s/p IR w/ revascularization 07/03/2020  . 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 Encephalopathy acute     Patient Centered Plan: Patient is on the following Treatment Plan(s):  Substance Abuse   Referrals to Alternative Service(s): Referred to Alternative Service(s):   Place:   Date:   Time:    Referred to Alternative Service(s):   Place:   Date:   Time:    Referred to Alternative Service(s):   Place:   Date:   Time:    Referred to Alternative Service(s):   Place:   Date:   Time:     Lilianne Delair A Khoi Hamberger, LCAS-A

## 2020-12-21 NOTE — ED Notes (Signed)
Pt observed on camera exposing himself to the patient across the hall.  RN and security entered the unit.  Patient returned to his room and doors were closed.

## 2020-12-21 NOTE — Tx Team (Signed)
Initial Treatment Plan 12/21/2020 4:38 PM Mark Oneal MEB:[id number]    PATIENT STRESSORS: Marital or family conflict Substance abuse   PATIENT STRENGTHS: Ability for insight Capable of independent living Motivation for treatment/growth Supportive family/friends Work skills   PATIENT IDENTIFIED PROBLEMS: Alcohol Abuse  Family conflict over pt's drinking                    DISCHARGE CRITERIA:  Ability to meet basic life and health needs Adequate post-discharge living arrangements Verbal commitment to aftercare and medication compliance Withdrawal symptoms are absent or subacute and managed without 24-hour nursing intervention  PRELIMINARY DISCHARGE PLAN: Attend aftercare/continuing care group Attend 12-step recovery group Return to previous living arrangement  PATIENT/FAMILY INVOLVEMENT: This treatment plan has been presented to and reviewed with the patient, Mark Oneal.  The patient and family have been given the opportunity to ask questions and make suggestions.  Ginger Carne, RN 12/21/2020, 4:38 PM

## 2020-12-21 NOTE — Progress Notes (Addendum)
Admission Note:   Pt admitted to [facility] from [facility] ED. Pt arrived alert and oriented to person, place, time and situation. Per report pt was positive for alcohol on admission, refused to provide urine for UDS, and was given Ativan in ED earlier today for scoring positive on CIWA. Pt has a history of a stroke and history of seizures. Per ED report pt was being given his seizures medications in ED. Per pt report, last seizure was 4 months ago. Pt reports that the trigger for admission is that his stepson could not stand to see him drunk anymore around the house, and wanted him to get help. Pt denies any current suicidal ideation, and also denies any history of suicidal ideation which is contradictory to chart review/history. Pt denies any homicidal ideation, denies hallucinations, reports feelings of anxiety and depression rating them both an 8/10 on a 0-10 scale, 10 being worst. While in ED pt was exposing himself, his genitalia to male patients across the hall in the ED, therefore pt was placed where he could be more easily observed by staff and security near the nurses station. Pt was positive on CIWA scale after admission and arrival to BMU, and was given 2mg  PO ativan; follow up CIWA was improved and =4. Vital signs are being monitored. Pt ate dinner and was offered a couple of 20oz.Gatorades to help with hydration. Pt denies pain. Pt's gait is steady. Pt called his wife, since he's been on the unit. Pt reports he lives with his wife, his children including his step son. Pt had nausea and vomiting earlier today while in ED, was given PRN meds which were effective. Pt is calm, pleasant, cooperative with admission process, affect is flat, eye contact is fair. Pt reports he is hopeful to get medications that will help prevent him from relapsing.  Pt was cooperative with skin assessment/body contraband search, charge RN was witness to skin assessment. Will continue to monitor pt per Q15 minute face checks and monitor  for safety and progress.

## 2020-12-21 NOTE — ED Notes (Signed)
IVC/Consult Completed/Rec.Inpt. Admit  

## 2020-12-21 NOTE — BHH Suicide Risk Assessment (Signed)
[facility] Admission Suicide Risk Assessment   Nursing information obtained from:  Patient Demographic factors:  Male Current Mental Status:  NA Loss Factors:  NA Historical Factors:  NA Risk Reduction Factors:  Employed,Positive social support,Living with another person, especially a relative,Responsible for children under 36 years of age,Sense of responsibility to family  Total Time spent with patient: 1 hour Principal Problem: Severe major depression, single episode, without psychotic features (HCC) Diagnosis:  Principal Problem:   Severe major depression, single episode, without psychotic features (HCC) Active Problems:   History of CVA with residual deficit   Seizures (HCC)   Hyperlipidemia LDL goal <70   Alcohol abuse  Subjective Data:35 year old male IVC by family due to suicidal ideations, heavy alcohol use, and depression. Patient admitted to making suicidal statements to several providers in the emergency room, but on exam today denies that he was ever feeling suicidal. He also minimizes his symptoms of depression and anxiety that he expressed in the emergency room. He does admit that he has been noncompliant with medications at home, and does admit that alcohol has become problematic in his life. He minimizes alcohol use stating he drinks roughly 5-6 beers per day, alcohol level 227 on admission. He states that his drinking is interfering with work, and with his family. He hopes to stop drinking for good to be there for his wife, daughter, and son. He is open to starting Naltrexone to curb alcohol cravings.   Continued Clinical Symptoms:  Alcohol Use Disorder Identification Test Final Score (AUDIT): 20 The "Alcohol Use Disorders Identification Test", Guidelines for Use in Primary Care, Second Edition.  World Science writer [facility]). Score between 0-7:  no or low risk or alcohol related problems. Score between 8-15:  moderate risk of alcohol related problems. Score between 16-19:   high risk of alcohol related problems. Score 20 or above:  warrants further diagnostic evaluation for alcohol dependence and treatment.   CLINICAL FACTORS:   Severe Anxiety and/or Agitation Depression:   Comorbid alcohol abuse/dependence Impulsivity Insomnia Severe Unstable or Poor Therapeutic Relationship Medical Diagnoses and Treatments/Surgeries   Musculoskeletal: Strength & Muscle Tone: within normal limits Gait & Station: normal Patient leans: N/A  Psychiatric Specialty Exam:  Presentation  General Appearance: Disheveled  Eye Contact:Fair  Speech:Normal Rate  Speech Volume:Increased  Handedness:Right   Mood and Affect  Mood:Anxious  Affect:Congruent   Thought Process  Thought Processes:Coherent  Descriptions of Associations:Intact  Orientation:Full (Time, Place and Person)  Thought Content:Logical  History of Schizophrenia/Schizoaffective disorder:No  Duration of Psychotic Symptoms:No data recorded Hallucinations:Hallucinations: None  Ideas of Reference:None  Suicidal Thoughts:Suicidal Thoughts: -- (Patient denies on exam, however, endorsed active suicidal ideations this morning) SI Active Intent and/or Plan: With Intent; With Plan; With Means to Carry Out; With Access to Means  Homicidal Thoughts:Homicidal Thoughts: No   Sensorium  Memory:Immediate Fair; Recent Fair; Remote Fair  Judgment:Impaired  Insight:Lacking   Executive Functions  Concentration:Poor  Attention Span:Poor  Recall:Poor  Fund of Knowledge:Fair  Language:Fair   Psychomotor Activity  Psychomotor Activity:Psychomotor Activity: Tremor Extrapyramidal Side Effects (EPS): Akathisia AIMS Completed?: No   Assets  Assets:Desire for Improvement; Resilience; Social Support   Sleep  Sleep:Sleep: Good    Physical Exam: Physical Exam ROS Blood pressure 128/86, pulse (!) 116, temperature 98.4 F (36.9 C), temperature source Oral, resp. rate 18, height 5\' 3"   (1.6 m), weight 75.3 kg, SpO2 99 %. Body mass index is 29.41 kg/m.   COGNITIVE FEATURES THAT CONTRIBUTE TO RISK:  Loss of executive function  SUICIDE RISK:   Moderate:  Frequent suicidal ideation with limited intensity, and duration, some specificity in terms of plans, no associated intent, good self-control, limited dysphoria/symptomatology, some risk factors present, and identifiable protective factors, including available and accessible social support.  PLAN OF CARE: Continue inpatient admission, see H&P for full details.   I certify that inpatient services furnished can reasonably be expected to improve the patient's condition.   Jesse Sans, MD 12/21/2020, 4:39 PM

## 2020-12-21 NOTE — ED Notes (Signed)
Pt discharged under IVC to BMU.  VS stable. Belongings sent with patient. Pt cooperative. 

## 2020-12-21 NOTE — ED Notes (Addendum)
Pt again exposed himself.  Pt made to sit in the dayroom to separate him form the male patient.

## 2020-12-22 MED ORDER — TRAZODONE HCL 50 MG PO TABS
50.0000 mg | ORAL_TABLET | Freq: Every evening | ORAL | Status: DC | PRN
  Administered 2020-12-22 – 2020-12-24 (×3): 50 mg via ORAL
  Filled 2020-12-22 (×3): qty 1

## 2020-12-22 MED ORDER — LORAZEPAM 1 MG PO TABS
1.0000 mg | ORAL_TABLET | Freq: Once | ORAL | Status: AC
  Administered 2020-12-22: 1 mg via ORAL

## 2020-12-22 MED ORDER — CITALOPRAM HYDROBROMIDE 20 MG PO TABS
20.0000 mg | ORAL_TABLET | Freq: Every day | ORAL | Status: DC
  Administered 2020-12-22 – 2020-12-24 (×3): 20 mg via ORAL
  Filled 2020-12-22 (×3): qty 1

## 2020-12-22 NOTE — Progress Notes (Signed)
Recreation Therapy Notes  INPATIENT RECREATION THERAPY ASSESSMENT  Patient Details Name: Mark Oneal MRN: [id number] DOB: [birth year] Today's Date: 12/22/2020       Information Obtained From: Chart Review  Able to Participate in Assessment/Interview:    Patient Presentation: Responsive  Reason for Admission (Per Patient): Patient Unable to Identify,Substance Abuse,Med Non-Compliance  Patient Stressors:    Coping Skills:   Substance Abuse  Leisure Interests (2+):  Social - Family  Frequency of Recreation/Participation:    Awareness of Community Resources:     [employer]:     Current Use:    If no, Barriers?:    Expressed Interest in [employer] Information:    [place] of Residence:  Film/video editor  Patient Main Form of Transportation:    Patient Strengths:  N/A  Patient Identified Areas of Improvement:  Get home  Patient Goal for Hospitalization:  To go home  Current SI (including self-harm):  No  Current HI:  No  Current AVH: No  Staff Intervention Plan: Group Attendance,Collaborate with Interdisciplinary Treatment Team  Consent to Intern Participation: N/A  Chibuike Fleek 12/22/2020, 3:11 PM

## 2020-12-22 NOTE — BHH Suicide Risk Assessment (Signed)
BHH INPATIENT:  Family/Significant Other Suicide Prevention Education  Suicide Prevention Education:  Education Completed; [name] Oseguera/wife [phone]), has been identified by the patient as the family member/significant other with whom the patient will be residing, and identified as the person(s) who will aid the patient in the event of a mental health crisis (suicidal ideations/suicide attempt).  With written consent from the patient, the family member/significant other has been provided the following suicide prevention education, prior to the and/or following the discharge of the patient.  The suicide prevention education provided includes the following:  Suicide risk factors  Suicide prevention and interventions  National Suicide Hotline telephone number  [facility] assessment telephone number  [facility] Emergency Assistance 911  [facility] and/or Residential Mobile Crisis Unit telephone number  Request made of family/significant other to:  Remove weapons (e.g., guns, rifles, knives), all items previously/currently identified as safety concern.    Remove drugs/medications (over-the-counter, prescriptions, illicit drugs), all items previously/currently identified as a safety concern.  The family member/significant other verbalizes understanding of the suicide prevention education information provided.  The family member/significant other agrees to remove the items of safety concern listed above.  Verl Bangs stated that pt was IVCd because he was drinking a lot. She shares that he has had two strokes with the last being in October 2021 and is on lots of medication. She stated that they wanted him to get help himself but he would not. She denied that he is a danger to himself or anyone else really. She shared that he does say things about hurting himself when he gets drunk and that he had stopped taking medication at times. She stated that since he is drinking a  lot of alcohol they wanted to avoid him getting to the point where he does hurt himself. Oseguera inquired regarding length of stay and this was explained to her as well as lack of visitation while here. No other concerns expressed. Contact ended without incident.   Glenis Smoker 12/22/2020, 4:13 PM
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
## 2020-12-22 NOTE — Progress Notes (Signed)
Recreation Therapy Notes    Date: 12/22/2020   Time: 2:00pm   Location: Courtyard     Behavioral response: N/A   Intervention Topic: Strengths    Discussion/Intervention: Patient did not attend group.   Clinical Observations/Feedback:  Patient did not attend group.   Breyona Swander LRT/CTRS        Mulki Roesler 12/22/2020 2:32 PM

## 2020-12-22 NOTE — Progress Notes (Incomplete)
Patient presents anxious at this encounter.  Reports having mild headache but declined to take any medicine at this time. Does report having high anxiety and says since he has stopped drinking that he is starting to feel side effects.  He also reports having some depression stating he misses his family and really and wants to go home. Inquires about what he needs to do or say to be able to leave.

## 2020-12-22 NOTE — BHH Group Notes (Signed)
LCSW Group Therapy Note  12/22/2020 1:55 PM  Type of Therapy and Topic:  Group Therapy:  Feelings around Relapse and Recovery  Participation Level:  Did Not Attend   Description of Group:    Patients in this group will discuss emotions they experience before and after a relapse. They will process how experiencing these feelings, or avoidance of experiencing them, relates to having a relapse. Facilitator will guide patients to explore emotions they have related to recovery. Patients will be encouraged to process which emotions are more powerful. They will be guided to discuss the emotional reaction significant others in their lives may have to their relapse or recovery. Patients will be assisted in exploring ways to respond to the emotions of others without this contributing to a relapse.  Therapeutic Goals: 1. Patient will identify two or more emotions that lead to a relapse for them 2. Patient will identify two emotions that result when they relapse 3. Patient will identify two emotions related to recovery 4. Patient will demonstrate ability to communicate their needs through discussion and/or role plays   Summary of Patient Progress: Patient did not attend group despite encouraged participation.    Therapeutic Modalities:    Cognitive Behavioral Therapy Solution-Focused Therapy Assertiveness Training Relapse Prevention Therapy   Gwenevere Ghazi, MSW, [place], [place] 12/22/2020 1:55 PM

## 2020-12-22 NOTE — Progress Notes (Signed)
Patient is in bed resting. Patient is calm and cooperative with assessment. Patient denies SI, HI, and AVH. Patient also denies all symptoms of alcohol withdrawal and is preoccupied with going home. Patient says "I am fine. I am ready to go home". It is noted that patient appears to be minimizing symptoms. Support and encouragement provided. Patient remains safe on the unit at this time and q15 min safety checks are maintained.

## 2020-12-22 NOTE — Progress Notes (Signed)
Patient was given 1mg  Ativan at 12:28pm for increasing BP. Patient is tearful in bed and states he just misses his family.

## 2020-12-22 NOTE — Plan of Care (Signed)
  Problem: Education: Goal: Emotional status will improve Outcome: Progressing Goal: Mental status will improve Outcome: Progressing Goal: Verbalization of understanding the information provided will improve Outcome: Progressing   Problem: Health Behavior/Discharge Planning: Goal: Compliance with therapeutic regimen will improve Outcome: Progressing

## 2020-12-22 NOTE — Tx Team (Addendum)
Interdisciplinary Treatment and Diagnostic Plan Update  12/22/2020 Time of Session: 0900  [name] MRN: [id number]  Principal Diagnosis: Severe major depression, single episode, without psychotic features (HCC)  Secondary Diagnoses: Principal Problem:   Severe major depression, single episode, without psychotic features (HCC) Active Problems:   History of CVA with residual deficit   Seizures (HCC)   Hyperlipidemia LDL goal <70   Alcohol abuse   Current Medications:  Current Facility-Administered Medications  Medication Dose Route Frequency Provider Last Rate Last Admin  . acetaminophen (TYLENOL) tablet 650 mg  650 mg Oral Q6H PRN Clapacs, John T, MD      . alum & mag hydroxide-simeth (MAALOX/MYLANTA) 200-200-20 MG/5ML suspension 30 mL  30 mL Oral Q4H PRN Clapacs, John T, MD      . atorvastatin (LIPITOR) tablet 80 mg  80 mg Oral Daily Clapacs, John T, MD   80 mg at 12/22/20 0800  . citalopram (CELEXA) tablet 10 mg  10 mg Oral QHS Jesse Sans, MD   10 mg at 12/21/20 2210  . clopidogrel (PLAVIX) tablet 75 mg  75 mg Oral Daily Jesse Sans, MD   75 mg at 12/22/20 0800  . docusate sodium (COLACE) capsule 100 mg  100 mg Oral BID Clapacs, Jackquline Denmark, MD   100 mg at 12/22/20 0801  . levETIRAcetam (KEPPRA) tablet 500 mg  500 mg Oral BID Clapacs, Jackquline Denmark, MD   500 mg at 12/22/20 0801  . LORazepam (ATIVAN) tablet 1-4 mg  1-4 mg Oral Q1H PRN Jesse Sans, MD   1 mg at 12/22/20 4098   Or  . LORazepam (ATIVAN) injection 1-4 mg  1-4 mg Intravenous Q1H PRN Jesse Sans, MD      . magnesium hydroxide (MILK OF MAGNESIA) suspension 30 mL  30 mL Oral Daily PRN Clapacs, John T, MD      . naltrexone (DEPADE) tablet 50 mg  50 mg Oral Daily Jesse Sans, MD   50 mg at 12/22/20 0801  . ondansetron (ZOFRAN-ODT) disintegrating tablet 4 mg  4 mg Oral Q8H PRN Clapacs, John T, MD      . pantoprazole (PROTONIX) EC tablet 40 mg  40 mg Oral Daily Clapacs, John T, MD   40 mg at 12/22/20 0800  .  thiamine tablet 100 mg  100 mg Oral Daily Clapacs, John T, MD   100 mg at 12/22/20 0800   Or  . thiamine (B-1) injection 100 mg  100 mg Intravenous Daily Clapacs, Jackquline Denmark, MD       PTA Medications: Medications Prior to Admission  Medication Sig Dispense Refill Last Dose  . atorvastatin (LIPITOR) 80 MG tablet TOME UNA TABLETA TODOS LOS DIAS 90 tablet 1   . citalopram (CELEXA) 10 MG tablet TAKE 1 TABLET BY MOUTH EVERYDAY AT BEDTIME 90 tablet 1   . clopidogrel (PLAVIX) 75 MG tablet TOME UNA TABLETA TODOS LOS DIAS 90 tablet 1   . docusate sodium (COLACE) 100 MG capsule TAKE 1 CAPSULE (100 MG TOTAL) BY MOUTH 2 (TWO) TIMES DAILY. 60 capsule 0   . levETIRAcetam (KEPPRA) 500 MG tablet Take 1 tablet (500 mg total) by mouth 2 (two) times daily. 120 tablet 3   . Multiple Vitamin (MULTIVITAMIN WITH MINERALS) TABS tablet Take 1 tablet by mouth daily. 30 tablet 0   . pantoprazole (PROTONIX) 40 MG tablet TAKE 1 TABLET (40 MG TOTAL) BY MOUTH DAILY AT 12 NOON. 90 tablet 1   . senna-docusate (SENOKOT-S) 8.6-50 MG  tablet Take 1 tablet by mouth 2 (two) times daily.       Patient Stressors: Marital or family conflict Substance abuse  Patient Strengths: Ability for insight Capable of independent living Motivation for treatment/growth Supportive family/friends Work skills  Treatment Modalities: Medication Management, Group therapy, Case management,  1 to 1 session with clinician, Psychoeducation, Recreational therapy.   Physician Treatment Plan for Primary Diagnosis: Severe major depression, single episode, without psychotic features (HCC) Long Term Goal(s): Improvement in symptoms so as ready for discharge Improvement in symptoms so as ready for discharge   Short Term Goals: Ability to identify changes in lifestyle to reduce recurrence of condition will improve Ability to verbalize feelings will improve Ability to disclose and discuss suicidal ideas Ability to demonstrate self-control will  improve Ability to identify and develop effective coping behaviors will improve Ability to maintain clinical measurements within normal limits will improve Compliance with prescribed medications will improve Ability to identify triggers associated with substance abuse/mental health issues will improve Ability to identify changes in lifestyle to reduce recurrence of condition will improve Ability to verbalize feelings will improve Ability to disclose and discuss suicidal ideas Ability to demonstrate self-control will improve Ability to identify and develop effective coping behaviors will improve Ability to maintain clinical measurements within normal limits will improve Compliance with prescribed medications will improve Ability to identify triggers associated with substance abuse/mental health issues will improve  Medication Management: Evaluate patient's response, side effects, and tolerance of medication regimen.  Therapeutic Interventions: 1 to 1 sessions, Unit Group sessions and Medication administration.  Evaluation of Outcomes: Progressing  Physician Treatment Plan for Secondary Diagnosis: Principal Problem:   Severe major depression, single episode, without psychotic features (HCC) Active Problems:   History of CVA with residual deficit   Seizures (HCC)   Hyperlipidemia LDL goal <70   Alcohol abuse  Long Term Goal(s): Improvement in symptoms so as ready for discharge Improvement in symptoms so as ready for discharge   Short Term Goals: Ability to identify changes in lifestyle to reduce recurrence of condition will improve Ability to verbalize feelings will improve Ability to disclose and discuss suicidal ideas Ability to demonstrate self-control will improve Ability to identify and develop effective coping behaviors will improve Ability to maintain clinical measurements within normal limits will improve Compliance with prescribed medications will improve Ability to  identify triggers associated with substance abuse/mental health issues will improve Ability to identify changes in lifestyle to reduce recurrence of condition will improve Ability to verbalize feelings will improve Ability to disclose and discuss suicidal ideas Ability to demonstrate self-control will improve Ability to identify and develop effective coping behaviors will improve Ability to maintain clinical measurements within normal limits will improve Compliance with prescribed medications will improve Ability to identify triggers associated with substance abuse/mental health issues will improve     Medication Management: Evaluate patient's response, side effects, and tolerance of medication regimen.  Therapeutic Interventions: 1 to 1 sessions, Unit Group sessions and Medication administration.  Evaluation of Outcomes: Progressing   RN Treatment Plan for Primary Diagnosis: Severe major depression, single episode, without psychotic features (HCC) Long Term Goal(s): Knowledge of disease and therapeutic regimen to maintain health will improve  Short Term Goals: Ability to remain free from injury will improve, Ability to verbalize frustration and anger appropriately will improve, Ability to demonstrate self-control, Ability to participate in decision making will improve, Ability to verbalize feelings will improve, Ability to disclose and discuss suicidal ideas, Ability to identify and develop effective  coping behaviors will improve and Compliance with prescribed medications will improve  Medication Management: RN will administer medications as ordered by provider, will assess and evaluate patient's response and provide education to patient for prescribed medication. RN will report any adverse and/or side effects to prescribing provider.  Therapeutic Interventions: 1 on 1 counseling sessions, Psychoeducation, Medication administration, Evaluate responses to treatment, Monitor vital signs and  CBGs as ordered, Perform/monitor CIWA, COWS, AIMS and Fall Risk screenings as ordered, Perform wound care treatments as ordered.  Evaluation of Outcomes: Progressing   LCSW Treatment Plan for Primary Diagnosis: Severe major depression, single episode, without psychotic features (HCC) Long Term Goal(s): Safe transition to appropriate next level of care at discharge, Engage patient in therapeutic group addressing interpersonal concerns.  Short Term Goals: Engage patient in aftercare planning with referrals and resources, Increase social support, Increase ability to appropriately verbalize feelings, Increase emotional regulation, Facilitate acceptance of mental health diagnosis and concerns, Facilitate patient progression through stages of change regarding substance use diagnoses and concerns, Identify triggers associated with mental health/substance abuse issues and Increase skills for wellness and recovery  Therapeutic Interventions: Assess for all discharge needs, 1 to 1 time with Social worker, Explore available resources and support systems, Assess for adequacy in community support network, Educate family and significant other(s) on suicide prevention, Complete Psychosocial Assessment, Interpersonal group therapy.  Evaluation of Outcomes: Progressing   Progress in Treatment: Attending groups: No. Participating in groups: No. Taking medication as prescribed: Yes. Toleration medication: Yes. Family/Significant other contact made: No, will contact:  CSW will obtain consent to contact collateral  Patient understands diagnosis: No. and As evidenced by:  Patient minimizes symptoms.  Discussing patient identified problems/goals with staff: Yes. Medical problems stabilized or resolved: No. Denies suicidal/homicidal ideation: Yes. Issues/concerns per patient self-inventory: Yes. Other: none   New problem(s) identified: Yes, Describe:  At admission, patient reported suicidal ideation and issues  related to alcohol use disorder  New Short Term/Long Term Goal(s): detox, medication management for mood stabilization; elimination of SI thoughts; development of comprehensive mental wellness/sobriety plan.  Patient Goals:  Patient reported that he is ready for discharge. Patient has a hx of minimizing symptoms.   Discharge Plan or Barriers: none reported at this time.  Reason for Continuation of Hospitalization: Anxiety Depression Medication stabilization Suicidal ideation Withdrawal symptoms  Estimated Length of Stay: 1-7 days   Recreational Therapy: Patient Stressors: N/A Patient Goal: Patient will engage in groups without prompting or encouragement from LRT x3 group sessions within 5 recreation therapy group sessions  Attendees: Patient: Mark Oneal 12/22/2020 10:16 AM  Physician: Les Pou, MD 12/22/2020 10:16 AM  Nursing: Torrie Mayers, RN  12/22/2020 10:16 AM  RN Care Manager: 12/22/2020 10:16 AM  Social Worker: Gwenevere Ghazi, MSW, [place], LCASA  12/22/2020 10:16 AM  Recreational Therapist: Hilbert Bible, LRT  12/22/2020 10:16 AM  Other: Jillyn Hidden, MSW, LCSW, LCAS  12/22/2020 10:16 AM  Other: Kiva [place], LCSWA 12/22/2020 10:16 AM  Other: 12/22/2020 10:16 AM    Scribe for Treatment Team: Corky Crafts, LCSWA 12/22/2020 10:16 AM

## 2020-12-22 NOTE — Progress Notes (Signed)
Patient has been in bed isolative to room. Complains of anxiousness and mild headache at this encounter. CIWA =11 Patient denies  SI HI AVH at this encounter but does endorse depression and anxiety.  Wants to be left alone to sleep.  Will continue to monitor with Q15 minute safety checks and was encouraged to come to staff with any concerns or change in status.    Cleo Butler-Nicholson, LPN

## 2020-12-22 NOTE — Plan of Care (Signed)
  Problem: Education: Goal: Emotional status will improve Outcome: Progressing   Problem: Education: Goal: Mental status will improve Outcome: Progressing   Problem: Education: Goal: Verbalization of understanding the information provided will improve Outcome: Progressing   

## 2020-12-22 NOTE — Progress Notes (Incomplete)
Patient presents anxious at this encounter.  Reports having mild headache but declined to take any medicine at this time. Does report having high anxiety and says since he has stopped drinking that he is starting to feel side effects.  He also reports having some depression stating he misses his family and really and wants to go home. Inquires about what he needs to do or say to be able to leave.  

## 2020-12-22 NOTE — Progress Notes (Signed)
Recreation Therapy Notes  INPATIENT RECREATION TR PLAN  Patient Details Name: Mark Oneal MRN: [id number] DOB: 1985/12/29 Today's Date: 12/22/2020  Rec Therapy Plan Is patient appropriate for Therapeutic Recreation?: Yes Treatment times per week: at least 3 Estimated Length of Stay: 5-7 days TR Treatment/Interventions: Group participation (Comment)  Discharge Criteria Pt will be discharged from therapy if:: Discharged Treatment plan/goals/alternatives discussed and agreed upon by:: Patient/family  Discharge Summary     Keva Darty 12/22/2020, 3:11 PM

## 2020-12-22 NOTE — Progress Notes (Signed)
[facility] MD Progress Note  12/22/2020 2:20 PM Mark Oneal  MRN:  [id number]  CC "Can I go home now?"  Subjective:  35 year old male IVC by family due to suicidal ideations, heavy alcohol use, and depression. No acute events overnight, CIWA was 11 overnight and treated with PRN ativan, medication compliant. Patient seen this morning during treatment team and again one-on-one at bedside with the assistance of spanish interpreter, Mark Oneal,  [phone]. He continues to minimize his suicidal statements, depression, and withdrawal symptoms. He continues to state that he is fine, and no one in his family is worried about him. He continues to deny he was ever suicidal despite saying this to numerous staff members in the emergency room. He denies any withdrawals from alcohol today as well. However, objectively, he appears very anxious, blood pressure was elevated at 120/103 with pulse 107, he had visible tremor of hand, and forehead was sweating. All symptoms resolved with 1 mg of Ativan given PO. Patient informed that he will not be discharged today due to concern for continued withdrawals and untreated depression. He denies any side effects to medications.  He denies SI/HI/AH/VH.   Principal Problem: Severe major depression, single episode, without psychotic features (HCC) Diagnosis: Principal Problem:   Severe major depression, single episode, without psychotic features (HCC) Active Problems:   History of CVA with residual deficit   Seizures (HCC)   Hyperlipidemia LDL goal <70   Alcohol abuse  Total Time spent with patient: 30 minutes  Past Psychiatric History: See H&P  Past Medical History:  Past Medical History:  Diagnosis Date  . 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 BUBBLE STUDY  07/06/2020   Procedure: BUBBLE STUDY;  Surgeon: Little IshikawaSchumann, Christopher L, MD;  [facility];  Service: Cardiovascular;;  . IR ANGIO INTRA  EXTRACRAN SEL COM CAROTID INNOMINATE UNI L MOD SED  07/03/2020  . IR CT HEAD LTD  07/03/2020  . IR CT HEAD LTD  07/03/2020  . IR PERCUTANEOUS ART THROMBECTOMY/INFUSION INTRACRANIAL INC DIAG ANGIO  07/03/2020  . IR US GUIDE VASC ACCESS RIGHT  07/03/2020  . RADIOLOGY WITH ANESTHESIA N/A 07/03/2020   Procedure: IR WITH ANESTHESIA;  Surgeon: Radiologist, Medication, MD;  Location: [facility] OR;  Service: Radiology;  Laterality: N/A;  . TEE WITHOUT CARDIOVERSION N/A 07/06/2020   Procedure: TRANSESOPHAGEAL ECHOCARDIOGRAM (TEE);  Surgeon: Little IshikawaSchumann, Christopher L, MD;  [facility];  Service: Cardiovascular;  Laterality: N/A;   Family History:  Family History  Problem Relation Age of Onset  . Hypertension Mother   . Hypertension Father    Family Psychiatric  History: See H&P Social History:  Social History   Substance and Sexual Activity  Alcohol Use Yes  . Alcohol/week: 3.0 standard drinks  . Types: 3 Cans of beer per week   Comment: 20 ounce cans     Social History   Substance and Sexual Activity  Drug Use Not Currently    Social History   Socioeconomic History  . Marital status: Single    Spouse name: [name]  . Number of children: 2  . Years of education: Not on file  . Highest education level: Not on file  Occupational History  . Not on file  Tobacco Use  . Smoking status: Former Smoker    Types: Cigarettes  . Smokeless tobacco: Never Used  . Tobacco comment: quit after stroke in 2020  Substance and Sexual Activity  . Alcohol use: Yes  Alcohol/week: 3.0 standard drinks    Types: 3 Cans of beer per week    Comment: 20 ounce cans  . Drug use: Not Currently  . Sexual activity: Not on file  Other Topics Concern  . Not on file  Social History Narrative   Lives with his partner of 8 years and has 2 kids with her. All of his family is in a different country.   Social Determinants of Health   Financial Resource Strain: Not on file  Food Insecurity: Not on file   Transportation Needs: Not on file  Physical Activity: Not on file  Stress: Not on file  Social Connections: Not on file   Additional Social History:                         Sleep: Good  Appetite:  Fair  Current Medications: Current Facility-Administered Medications  Medication Dose Route Frequency Provider Last Rate Last Admin  . acetaminophen (TYLENOL) tablet 650 mg  650 mg Oral Q6H PRN Clapacs, John T, MD      . alum & mag hydroxide-simeth (MAALOX/MYLANTA) 200-200-20 MG/5ML suspension 30 mL  30 mL Oral Q4H PRN Clapacs, John T, MD      . atorvastatin (LIPITOR) tablet 80 mg  80 mg Oral Daily Clapacs, John T, MD   80 mg at 12/22/20 0800  . citalopram (CELEXA) tablet 10 mg  10 mg Oral QHS Jesse Sans, MD   10 mg at 12/21/20 2210  . clopidogrel (PLAVIX) tablet 75 mg  75 mg Oral Daily Jesse Sans, MD   75 mg at 12/22/20 0800  . docusate sodium (COLACE) capsule 100 mg  100 mg Oral BID Clapacs, Jackquline Denmark, MD   100 mg at 12/22/20 0801  . levETIRAcetam (KEPPRA) tablet 500 mg  500 mg Oral BID Clapacs, Jackquline Denmark, MD   500 mg at 12/22/20 0801  . LORazepam (ATIVAN) tablet 1-4 mg  1-4 mg Oral Q1H PRN Jesse Sans, MD   1 mg at 12/22/20 9794   Or  . LORazepam (ATIVAN) injection 1-4 mg  1-4 mg Intravenous Q1H PRN Jesse Sans, MD      . magnesium hydroxide (MILK OF MAGNESIA) suspension 30 mL  30 mL Oral Daily PRN Clapacs, John T, MD      . naltrexone (DEPADE) tablet 50 mg  50 mg Oral Daily Jesse Sans, MD   50 mg at 12/22/20 0801  . ondansetron (ZOFRAN-ODT) disintegrating tablet 4 mg  4 mg Oral Q8H PRN Clapacs, John T, MD      . pantoprazole (PROTONIX) EC tablet 40 mg  40 mg Oral Daily Clapacs, John T, MD   40 mg at 12/22/20 0800  . thiamine tablet 100 mg  100 mg Oral Daily Clapacs, John T, MD   100 mg at 12/22/20 0800   Or  . thiamine (B-1) injection 100 mg  100 mg Intravenous Daily Clapacs, Jackquline Denmark, MD        Lab Results:  Results for orders placed or performed  during the hospital encounter of 12/20/20 (from the past 48 hour(s))  Comprehensive metabolic panel     Status: Abnormal   Collection Time: 12/20/20 11:14 PM  Result Value Ref Range   Sodium 140 135 - 145 mmol/L   Potassium 3.6 3.5 - 5.1 mmol/L   Chloride 104 98 - 111 mmol/L   CO2 25 22 - 32 mmol/L   Glucose, Bld 97 70 -  99 mg/dL    Comment: Glucose reference range applies only to samples taken after fasting for at least 8 hours.   BUN 8 6 - 20 mg/dL   Creatinine, Ser 1.61 0.61 - 1.24 mg/dL   Calcium 9.1 8.9 - 09.6 mg/dL   Total Protein 7.9 6.5 - 8.1 g/dL   Albumin 4.6 3.5 - 5.0 g/dL   AST 98 (H) 15 - 41 U/L   ALT 48 (H) 0 - 44 U/L   Alkaline Phosphatase 104 38 - 126 U/L   Total Bilirubin 1.1 0.3 - 1.2 mg/dL   GFR, Estimated >04 >54 mL/min    Comment: (NOTE) Calculated using the CKD-EPI Creatinine Equation (2021)    Anion gap 11 5 - 15    Comment: Performed at [facility], [address] [place], [place] [address]  Ethanol     Status: Abnormal   Collection Time: 12/20/20 11:14 PM  Result Value Ref Range   Alcohol, Ethyl (B) 227 (H) <10 mg/dL    Comment: (NOTE) Lowest detectable limit for serum alcohol is 10 mg/dL.  For medical purposes only. Performed at [facility], [address] [place], [place] [address]   Salicylate level     Status: Abnormal   Collection Time: 12/20/20 11:14 PM  Result Value Ref Range   Salicylate Lvl <7.0 (L) 7.0 - 30.0 mg/dL    Comment: Performed at [facility], [address] [place], [place] [address]  Acetaminophen level     Status: Abnormal   Collection Time: 12/20/20 11:14 PM  Result Value Ref Range   Acetaminophen (Tylenol), Serum <10 (L) 10 - 30 ug/mL    Comment: (NOTE) Therapeutic concentrations vary significantly. A range of 10-30 ug/mL  may be an effective concentration for many patients. However, some  are best treated at concentrations outside of this range. Acetaminophen concentrations >150  ug/mL at 4 hours after ingestion  and >50 ug/mL at 12 hours after ingestion are often associated with  toxic reactions.  Performed at [facility], [address] [place], [place] [address]   cbc     Status: None   Collection Time: 12/20/20 11:14 PM  Result Value Ref Range   WBC 7.7 4.0 - 10.5 K/uL   RBC 4.78 4.22 - 5.81 MIL/uL   Hemoglobin 14.7 13.0 - 17.0 g/dL   HCT 57.8 46.9 - 62.9 %   MCV 87.2 80.0 - 100.0 fL   MCH 30.8 26.0 - 34.0 pg   MCHC 35.3 30.0 - 36.0 g/dL   RDW 52.8 41.3 - 24.4 %   Platelets 186 150 - 400 K/uL   nRBC 0.0 0.0 - 0.2 %    Comment: Performed at [facility], [address] [place], [place] [address]  Resp Panel by RT-PCR (Flu A&B, Covid) Nasopharyngeal Swab     Status: None   Collection Time: 12/21/20 12:00 AM   Specimen: Nasopharyngeal Swab; Nasopharyngeal(NP) swabs in vial transport medium  Result Value Ref Range   SARS Coronavirus 2 by RT PCR NEGATIVE NEGATIVE    Comment: (NOTE) SARS-CoV-2 target nucleic acids are NOT DETECTED.  The SARS-CoV-2 RNA is generally detectable in upper respiratory specimens during the acute phase of infection. The lowest concentration of SARS-CoV-2 viral copies this assay can detect is 138 copies/mL. A negative result does not preclude SARS-Cov-2 infection and should not be used as the sole basis for treatment or other patient management decisions. A negative result may occur with  improper specimen collection/handling,  submission of specimen other than nasopharyngeal swab, presence of viral mutation(s) within the areas targeted by this assay, and inadequate number of viral copies(<138 copies/mL). A negative result must be combined with clinical observations, patient history, and epidemiological information. The expected result is Negative.  Fact Sheet for Patients:  BloggerCourse.com  Fact Sheet for Healthcare Providers:   SeriousBroker.it  This test is no t yet approved or cleared by the [place] FDA and  has been authorized for detection and/or diagnosis of SARS-CoV-2 by FDA under an Emergency Use Authorization (EUA). This EUA will remain  in effect (meaning this test can be used) for the duration of the COVID-19 declaration under Section 564(b)(1) of the Act, 21 U.S.C.section 360bbb-3(b)(1), unless the authorization is terminated  or revoked sooner.       Influenza A by PCR NEGATIVE NEGATIVE   Influenza B by PCR NEGATIVE NEGATIVE    Comment: (NOTE) The Xpert Xpress SARS-CoV-2/FLU/RSV plus assay is intended as an aid in the diagnosis of influenza from Nasopharyngeal swab specimens and should not be used as a sole basis for treatment. Nasal washings and aspirates are unacceptable for Xpert Xpress SARS-CoV-2/FLU/RSV testing.  Fact Sheet for Patients: BloggerCourse.com  Fact Sheet for Healthcare Providers: SeriousBroker.it  This test is not yet approved or cleared by the [place] FDA and has been authorized for detection and/or diagnosis of SARS-CoV-2 by FDA under an Emergency Use Authorization (EUA). This EUA will remain in effect (meaning this test can be used) for the duration of the COVID-19 declaration under Section 564(b)(1) of the Act, 21 U.S.C. section 360bbb-3(b)(1), unless the authorization is terminated or revoked.  Performed at [facility], [address] [place], [place] [address]     Blood Alcohol level:  Lab Results  Component Value Date   ETH 227 (H) 12/20/2020   ETH 277 (H) 12/19/2020    Metabolic Disorder Labs: Lab Results  Component Value Date   HGBA1C 5.6 07/04/2020   MPG 114 07/04/2020   No results found for: PROLACTIN Lab Results  Component Value Date   CHOL 156 10/17/2020   TRIG 120 10/17/2020   HDL 43 10/17/2020   CHOLHDL 3.6 10/17/2020   VLDL UNABLE TO  CALCULATE IF TRIGLYCERIDE OVER 400 mg/dL 67/59/1638   LDLCALC 91 10/17/2020   LDLCALC UNABLE TO CALCULATE IF TRIGLYCERIDE OVER 400 mg/dL 46/65/9935    Physical Findings: AIMS:  , ,  ,  ,    CIWA:  CIWA-Ar Total: 3 COWS:     Musculoskeletal: Strength & Muscle Tone: within normal limits Gait & Station: normal Patient leans: N/A  Psychiatric Specialty Exam:  Presentation  General Appearance: Disheveled  Eye Contact:Minimal  Speech:Normal Rate  Speech Volume:Normal  Handedness:Right   Mood and Affect  Mood:Anxious  Affect:Congruent   Thought Process  Thought Processes:Goal Directed  Descriptions of Associations:Intact  Orientation:Full (Time, Place and Person)  Thought Content:Perseveration  History of Schizophrenia/Schizoaffective disorder:No  Duration of Psychotic Symptoms:No data recorded Hallucinations:Hallucinations: None  Ideas of Reference:None  Suicidal Thoughts:Suicidal Thoughts: No SI Active Intent and/or Plan: With Intent; With Plan; With Means to Carry Out; With Access to Means  Homicidal Thoughts:Homicidal Thoughts: No   Sensorium  Memory:Immediate Fair; Recent Fair; Remote Fair  Judgment:Poor  Insight:Lacking   Executive Functions  Concentration:Poor  Attention Span:Poor  Recall:Poor  Fund of Knowledge:Fair  Language:Fair   Psychomotor Activity  Psychomotor Activity:Psychomotor Activity: Restlessness Extrapyramidal Side Effects (EPS): Akathisia AIMS Completed?: No   Assets  Assets:Desire for Improvement; Social Support; Resilience;  Housing; Talents/Skills   Sleep  Sleep:Sleep: Good Number of Hours of Sleep: 10    Physical Exam: Physical Exam ROS Blood pressure 139/89, pulse 83, temperature 97.8 F (36.6 C), temperature source Oral, resp. rate 16, height 5\' 3"  (1.6 m), weight 75.3 kg, SpO2 98 %. Body mass index is 29.41 kg/m.   Treatment Plan Summary: Daily contact with patient to assess and evaluate  symptoms and progress in treatment and Medication management 1) MDD, single episode, severe without psychotic features- new problem, unstable, suicidal ideations at home - Increase Celexa 20 mg daily  2) Severe alcohol use disorder- severe problem, unstable - Continue Natrexone 50 mg daily for alcohol cravings - CIWA protocol with PRN Ativan in place - MVI, folic acid, thiamine  3) History of CVA with residual anxiety and decrease left hand dexterity  - Continue Plavix 75 mg daily  4) HLD - Continue Lipitor 80 mg nightly  5) Seizure disorder - Continue Keppra 500 mg BID  , MD 12/22/2020, 2:20 PM

## 2020-12-23 ENCOUNTER — Other Ambulatory Visit: Payer: Self-pay

## 2020-12-23 MED ORDER — CITALOPRAM HYDROBROMIDE 20 MG PO TABS
20.0000 mg | ORAL_TABLET | Freq: Every day | ORAL | 0 refills | Status: DC
  Filled 2020-12-23: qty 7, 7d supply, fill #0

## 2020-12-23 MED ORDER — CLOPIDOGREL BISULFATE 75 MG PO TABS
75.0000 mg | ORAL_TABLET | Freq: Every day | ORAL | 0 refills | Status: DC
  Filled 2020-12-23: qty 7, 7d supply, fill #0

## 2020-12-23 MED ORDER — VITAMIN B-1 100 MG PO TABS
100.0000 mg | ORAL_TABLET | Freq: Every day | ORAL | 0 refills | Status: DC
  Filled 2020-12-23: qty 7, 7d supply, fill #0

## 2020-12-23 MED ORDER — LEVETIRACETAM 500 MG PO TABS
500.0000 mg | ORAL_TABLET | Freq: Two times a day (BID) | ORAL | 0 refills | Status: DC
  Filled 2020-12-23: qty 14, 7d supply, fill #0

## 2020-12-23 MED ORDER — PANTOPRAZOLE SODIUM 40 MG PO TBEC
40.0000 mg | DELAYED_RELEASE_TABLET | Freq: Every day | ORAL | 0 refills | Status: DC
  Filled 2020-12-23: qty 7, 7d supply, fill #0

## 2020-12-23 MED ORDER — ATORVASTATIN CALCIUM 80 MG PO TABS
80.0000 mg | ORAL_TABLET | Freq: Every day | ORAL | 0 refills | Status: DC
  Filled 2020-12-23: qty 7, 7d supply, fill #0

## 2020-12-23 MED ORDER — NALTREXONE HCL 50 MG PO TABS
50.0000 mg | ORAL_TABLET | Freq: Every day | ORAL | 0 refills | Status: DC
  Filled 2020-12-23: qty 7, 7d supply, fill #0

## 2020-12-23 NOTE — Plan of Care (Signed)
Patient was visible in the milieu, pleasant and cooperative. Denied SI/HI/AVH. Complained  of anxiety and restlessness and received medications. Mentioned that he would like to be discharged as soon as possible for employment purpose "no work no food". Patient was pleasant and had no additional concerns. Currently in bed asleep. Safety monitored as recommended.

## 2020-12-23 NOTE — Progress Notes (Signed)
Patient is A & O x3.  He is moving about the area.  He requested to use his phone to call family member.  Patient is calm and cooperative with assessment.  Patient denies SI/HI.  States he wants to go come but is concerned about drinking alcohol.  Support and encouragement provided.  Patient remains safe on the unit at this time.

## 2020-12-23 NOTE — Progress Notes (Signed)
[facility] MD Progress Note  12/23/2020 11:30 AM Mark Oneal  MRN:  [id number]   CC "Can I leave today?"  Subjective:  35 year old male IVC by family due to suicidal ideations, heavy alcohol use, and depression. No acute events overnight, medication compliant, attending to ADLs. Patient seen at bedside this morning. He continues to minimize his suicidal statements and alcohol use today. However, he does admit concern about drinking at discharge, and wants to make sure he has a supply of medications prior to leaving the hospital as he struggles to afford his prescriptions. He denies suicidal ideations, homicidal ideations, visual hallucinations, and auditory hallucinations. CSW informed of concerns about ability to pay for medications. Will provide number to social security office to see if he will qualify for Medicaid given significant strokes in the past. Will also ensure patient has a 7-day supply of free medications prior to discharge.   Principal Problem: Severe major depression, single episode, without psychotic features (HCC) Diagnosis: Principal Problem:   Severe major depression, single episode, without psychotic features (HCC) Active Problems:   History of CVA with residual deficit   Seizures (HCC)   Hyperlipidemia LDL goal <70   Alcohol abuse  Total Time spent with patient: 20 minutes  Past Psychiatric History: See H&P  Past Medical History:  Past Medical History:  Diagnosis Date  . 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 BUBBLE STUDY  07/06/2020   Procedure: BUBBLE STUDY;  Surgeon: Little Ishikawa, MD;  Location: [facility] ENDOSCOPY;  Service: Cardiovascular;;  . IR ANGIO INTRA EXTRACRAN SEL COM CAROTID INNOMINATE UNI L MOD SED  07/03/2020  . IR CT HEAD LTD  07/03/2020  . IR CT HEAD LTD  07/03/2020  . IR PERCUTANEOUS ART THROMBECTOMY/INFUSION INTRACRANIAL INC DIAG ANGIO  07/03/2020  . IR US GUIDE VASC ACCESS RIGHT   07/03/2020  . RADIOLOGY WITH ANESTHESIA N/A 07/03/2020   Procedure: IR WITH ANESTHESIA;  Surgeon: Radiologist, Medication, MD;  Location: [facility] OR;  Service: Radiology;  Laterality: N/A;  . TEE WITHOUT CARDIOVERSION N/A 07/06/2020   Procedure: TRANSESOPHAGEAL ECHOCARDIOGRAM (TEE);  Surgeon: Little Ishikawa, MD;  Location: [facility] ENDOSCOPY;  Service: Cardiovascular;  Laterality: N/A;   Family History:  Family History  Problem Relation Age of Onset  . Hypertension Mother   . Hypertension Father    Family Psychiatric  History: See H&P Social History:  Social History   Substance and Sexual Activity  Alcohol Use Yes  . Alcohol/week: 3.0 standard drinks  . Types: 3 Cans of beer per week   Comment: 20 ounce cans     Social History   Substance and Sexual Activity  Drug Use Not Currently    Social History   Socioeconomic History  . Marital status: Single    Spouse name: [name]  . Number of children: 2  . Years of education: Not on file  . Highest education level: Not on file  Occupational History  . Not on file  Tobacco Use  . Smoking status: Former Smoker    Types: Cigarettes  . Smokeless tobacco: Never Used  . Tobacco comment: quit after stroke in 2020  Substance and Sexual Activity  . Alcohol use: Yes    Alcohol/week: 3.0 standard drinks    Types: 3 Cans of beer per week    Comment: 20 ounce cans  . Drug use: Not Currently  . Sexual activity: Not on file  Other Topics Concern  . Not on  file  Social History Narrative   Lives with his partner of 8 years and has 2 kids with her. All of his family is in a different country.   Social Determinants of Health   Financial Resource Strain: Not on file  Food Insecurity: Not on file  Transportation Needs: Not on file  Physical Activity: Not on file  Stress: Not on file  Social Connections: Not on file   Additional Social History:                         Sleep: Fair  Appetite:  Good  Current  Medications: Current Facility-Administered Medications  Medication Dose Route Frequency Provider Last Rate Last Admin  . acetaminophen (TYLENOL) tablet 650 mg  650 mg Oral Q6H PRN Clapacs, Jackquline Denmark, MD   650 mg at 12/22/20 2232  . alum & mag hydroxide-simeth (MAALOX/MYLANTA) 200-200-20 MG/5ML suspension 30 mL  30 mL Oral Q4H PRN Clapacs, John T, MD      . atorvastatin (LIPITOR) tablet 80 mg  80 mg Oral Daily Clapacs, Jackquline Denmark, MD   80 mg at 12/23/20 0817  . citalopram (CELEXA) tablet 20 mg  20 mg Oral QHS Jesse Sans, MD   20 mg at 12/22/20 2126  . clopidogrel (PLAVIX) tablet 75 mg  75 mg Oral Daily Jesse Sans, MD   75 mg at 12/23/20 0817  . docusate sodium (COLACE) capsule 100 mg  100 mg Oral BID Clapacs, Jackquline Denmark, MD   100 mg at 12/23/20 0817  . levETIRAcetam (KEPPRA) tablet 500 mg  500 mg Oral BID Clapacs, Jackquline Denmark, MD   500 mg at 12/23/20 0818  . LORazepam (ATIVAN) tablet 1-4 mg  1-4 mg Oral Q1H PRN Jesse Sans, MD   2 mg at 12/22/20 2126   Or  . LORazepam (ATIVAN) injection 1-4 mg  1-4 mg Intravenous Q1H PRN Jesse Sans, MD      . magnesium hydroxide (MILK OF MAGNESIA) suspension 30 mL  30 mL Oral Daily PRN Clapacs, John T, MD      . naltrexone (DEPADE) tablet 50 mg  50 mg Oral Daily Jesse Sans, MD   50 mg at 12/23/20 0819  . ondansetron (ZOFRAN-ODT) disintegrating tablet 4 mg  4 mg Oral Q8H PRN Clapacs, John T, MD      . pantoprazole (PROTONIX) EC tablet 40 mg  40 mg Oral Daily Clapacs, Jackquline Denmark, MD   40 mg at 12/23/20 0820  . thiamine tablet 100 mg  100 mg Oral Daily Clapacs, John T, MD   100 mg at 12/23/20 0818   Or  . thiamine (B-1) injection 100 mg  100 mg Intravenous Daily Clapacs, John T, MD      . traZODone (DESYREL) tablet 50 mg  50 mg Oral QHS PRN Jesse Sans, MD   50 mg at 12/22/20 2126    Lab Results: No results found for this or any previous visit (from the past 48 hour(s)).  Blood Alcohol level:  Lab Results  Component Value Date   ETH 227 (H)  12/20/2020   ETH 277 (H) 12/19/2020    Metabolic Disorder Labs: Lab Results  Component Value Date   HGBA1C 5.6 07/04/2020   MPG 114 07/04/2020   No results found for: PROLACTIN Lab Results  Component Value Date   CHOL 156 10/17/2020   TRIG 120 10/17/2020   HDL 43 10/17/2020   CHOLHDL 3.6 10/17/2020  VLDL UNABLE TO CALCULATE IF TRIGLYCERIDE OVER 400 mg/dL 89/38/1017   LDLCALC 91 10/17/2020   LDLCALC UNABLE TO CALCULATE IF TRIGLYCERIDE OVER 400 mg/dL 51/10/5850    Physical Findings: AIMS:  , ,  ,  ,    CIWA:  CIWA-Ar Total: 3 COWS:     Musculoskeletal: Strength & Muscle Tone: within normal limits Gait & Station: normal Patient leans: N/A  Psychiatric Specialty Exam:  Presentation  General Appearance: Disheveled  Eye Contact:Minimal  Speech:Normal Rate  Speech Volume:Normal  Handedness:Right   Mood and Affect  Mood:Anxious  Affect:Congruent   Thought Process  Thought Processes:Goal Directed  Descriptions of Associations:Intact  Orientation:Full (Time, Place and Person)  Thought Content:Perseveration  History of Schizophrenia/Schizoaffective disorder:No  Duration of Psychotic Symptoms:No data recorded Hallucinations:Hallucinations: None  Ideas of Reference:None  Suicidal Thoughts:Suicidal Thoughts: No  Homicidal Thoughts:Homicidal Thoughts: No   Sensorium  Memory:Immediate Fair; Recent Fair; Remote Fair  Judgment:Poor  Insight:Lacking   Executive Functions  Concentration:Poor  Attention Span:Poor  Recall:Poor  Fund of Knowledge:Fair  Language:Fair   Psychomotor Activity  Psychomotor Activity:Psychomotor Activity: Restlessness AIMS Completed?: No   Assets  Assets:Desire for Improvement; Social Support; Resilience; Housing; Talents/Skills   Sleep  Sleep:Sleep: Good Number of Hours of Sleep: 10    Physical Exam: Physical Exam Skin:    Capillary Refill: Capillary refill takes less than 2 seconds.    ROS Blood  pressure 136/86, pulse 98, temperature 97.9 F (36.6 C), temperature source Oral, resp. rate 16, height 5\' 3"  (1.6 m), weight 75.3 kg, SpO2 100 %. Body mass index is 29.41 kg/m.   Treatment Plan Summary: Daily contact with patient to assess and evaluate symptoms and progress in treatment and Medication management 1) MDD, single episode, severe without psychotic features- new problem, improving - Continue Celexa 20 mg daily  2) Severe alcohol use disorder- severe problem, unstable - Continue Natrexone 50 mg daily for alcohol cravings - CIWA protocol with PRN Ativan in place - MVI, folic acid, thiamine  3) History of CVA with residual anxiety anddecrease left hand dexterity - Continue Plavix 75 mg daily  4) HLD - Continue Lipitor 80 mg nightly  5) Seizure disorder - Continue Keppra 500 mg BID  12/23/20: Psychiatric exam above reviewed and remains accurate. Assessment and plan above reviewed and updated.   02/22/21, MD 12/23/2020, 11:30 AM

## 2020-12-24 MED ORDER — ATORVASTATIN CALCIUM 80 MG PO TABS: 80.0000 mg | ORAL_TABLET | Freq: Every day | ORAL | 1 refills | Status: DC

## 2020-12-24 MED ORDER — LEVETIRACETAM 500 MG PO TABS: 500.0000 mg | ORAL_TABLET | Freq: Two times a day (BID) | ORAL | 1 refills | Status: DC

## 2020-12-24 MED ORDER — NALTREXONE HCL 50 MG PO TABS: 50.0000 mg | ORAL_TABLET | Freq: Every day | ORAL | 1 refills | Status: DC

## 2020-12-24 MED ORDER — TRAZODONE HCL 50 MG PO TABS: 50.0000 mg | ORAL_TABLET | Freq: Every evening | ORAL | 1 refills | Status: DC | PRN

## 2020-12-24 MED ORDER — CLOPIDOGREL BISULFATE 75 MG PO TABS: 75.0000 mg | ORAL_TABLET | Freq: Every day | ORAL | 1 refills | Status: DC

## 2020-12-24 MED ORDER — CITALOPRAM HYDROBROMIDE 20 MG PO TABS: 20.0000 mg | ORAL_TABLET | Freq: Every day | ORAL | 1 refills | Status: AC

## 2020-12-24 MED ORDER — PANTOPRAZOLE SODIUM 40 MG PO TBEC: 40.0000 mg | DELAYED_RELEASE_TABLET | Freq: Every day | ORAL | 1 refills | Status: DC

## 2020-12-24 NOTE — Plan of Care (Signed)
  Problem: Education: Goal: Knowledge of [place] [facility] Education information/materials will improve Outcome: Progressing Goal: Emotional status will improve Outcome: Progressing Goal: Mental status will improve Outcome: Progressing Goal: Verbalization of understanding the information provided will improve Outcome: Progressing   Problem: Activity: Goal: Interest or engagement in activities will improve Outcome: Progressing Goal: Sleeping patterns will improve Outcome: Progressing   Problem: Coping: Goal: Ability to verbalize frustrations and anger appropriately will improve Outcome: Progressing Goal: Ability to demonstrate self-control will improve Outcome: Progressing   Problem: Health Behavior/Discharge Planning: Goal: Identification of resources available to assist in meeting health care needs will improve Outcome: Progressing Goal: Compliance with treatment plan for underlying cause of condition will improve Outcome: Progressing   Problem: Physical Regulation: Goal: Ability to maintain clinical measurements within normal limits will improve Outcome: Progressing   Problem: Safety: Goal: Periods of time without injury will increase Outcome: Progressing   Problem: Education: Goal: Utilization of techniques to improve thought processes will improve Outcome: Progressing Goal: Knowledge of the prescribed therapeutic regimen will improve Outcome: Progressing   Problem: Activity: Goal: Interest or engagement in leisure activities will improve Outcome: Progressing Goal: Imbalance in normal sleep/wake cycle will improve Outcome: Progressing   Problem: Coping: Goal: Coping ability will improve Outcome: Progressing Goal: Will verbalize feelings Outcome: Progressing   Problem: Health Behavior/Discharge Planning: Goal: Ability to make decisions will improve Outcome: Progressing Goal: Compliance with therapeutic regimen will improve Outcome: Progressing    Problem: Role Relationship: Goal: Will demonstrate positive changes in social behaviors and relationships Outcome: Progressing   Problem: Safety: Goal: Ability to disclose and discuss suicidal ideas will improve Outcome: Progressing Goal: Ability to identify and utilize support systems that promote safety will improve Outcome: Progressing   Problem: Self-Concept: Goal: Will verbalize positive feelings about self Outcome: Progressing Goal: Level of anxiety will decrease Outcome: Progressing   Problem: Education: Goal: Knowledge of disease or condition will improve Outcome: Progressing Goal: Understanding of discharge needs will improve Outcome: Progressing   Problem: Health Behavior/Discharge Planning: Goal: Ability to identify changes in lifestyle to reduce recurrence of condition will improve Outcome: Progressing Goal: Identification of resources available to assist in meeting health care needs will improve Outcome: Progressing   Problem: Physical Regulation: Goal: Complications related to the disease process, condition or treatment will be avoided or minimized Outcome: Progressing   Problem: Safety: Goal: Ability to remain free from injury will improve Outcome: Progressing   

## 2020-12-24 NOTE — Progress Notes (Signed)
[facility] MD Progress Note  12/24/2020 1:41 PM Mark Oneal  MRN:  [id number]   CC "Am I okay?"  Subjective:  35 year old male IVC by family due to suicidal ideations, heavy alcohol use, and depression. No acute events overnight, medication compliant, attending to ADLs. Patient seen at bedside this morning. He continues to minimize his suicidal statements and alcohol use today. He denies any symptoms of anxiety or depression. He denies any SI/HI/AH/VH. He notes he is concerned about losing his job if he does not show up to work on time. He is only mildly reassured when informed he would be given a letter for work that he was in the hospital. He is open to follow-up with outpatient provider at discharge.   Principal Problem: Severe major depression, single episode, without psychotic features (HCC) Diagnosis: Principal Problem:   Severe major depression, single episode, without psychotic features (HCC) Active Problems:   History of CVA with residual deficit   Seizures (HCC)   Hyperlipidemia LDL goal <70   Alcohol abuse  Total Time spent with patient: 20 minutes  Past Psychiatric History: See H&P  Past Medical History:  Past Medical History:  Diagnosis Date  . 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 BUBBLE STUDY  07/06/2020   Procedure: BUBBLE STUDY;  Surgeon: Little Ishikawa, MD;  Location: [facility] ENDOSCOPY;  Service: Cardiovascular;;  . IR ANGIO INTRA EXTRACRAN SEL COM CAROTID INNOMINATE UNI L MOD SED  07/03/2020  . IR CT HEAD LTD  07/03/2020  . IR CT HEAD LTD  07/03/2020  . IR PERCUTANEOUS ART THROMBECTOMY/INFUSION INTRACRANIAL INC DIAG ANGIO  07/03/2020  . IR US GUIDE VASC ACCESS RIGHT  07/03/2020  . RADIOLOGY WITH ANESTHESIA N/A 07/03/2020   Procedure: IR WITH ANESTHESIA;  Surgeon: Radiologist, Medication, MD;  Location: [facility] OR;  Service: Radiology;  Laterality: N/A;  . TEE WITHOUT CARDIOVERSION N/A 07/06/2020    Procedure: TRANSESOPHAGEAL ECHOCARDIOGRAM (TEE);  Surgeon: Little Ishikawa, MD;  Location[facility] ENDOSCOPY;  Service: Cardiovascular;  Laterality: N/A;   Family History:  Family History  Problem Relation Age of Onset  . Hypertension Mother   . Hypertension Father    Family Psychiatric  History: See H&P Social History:  Social History   Substance and Sexual Activity  Alcohol Use Yes  . Alcohol/week: 3.0 standard drinks  . Types: 3 Cans of beer per week   Comment: 20 ounce cans     Social History   Substance and Sexual Activity  Drug Use Not Currently    Social History   Socioeconomic History  . Marital status: Single    Spouse name: [name]  . Number of children: 2  . Years of education: Not on file  . Highest education level: Not on file  Occupational History  . Not on file  Tobacco Use  . Smoking status: Former Smoker    Types: Cigarettes  . Smokeless tobacco: Never Used  . Tobacco comment: quit after stroke in 2020  Substance and Sexual Activity  . Alcohol use: Yes    Alcohol/week: 3.0 standard drinks    Types: 3 Cans of beer per week    Comment: 20 ounce cans  . Drug use: Not Currently  . Sexual activity: Not on file  Other Topics Concern  . Not on file  Social History Narrative   Lives with his partner of 8 years and has 2 kids with her. All of his family is in a  different country.   Social Determinants of Health   Financial Resource Strain: Not on file  Food Insecurity: Not on file  Transportation Needs: Not on file  Physical Activity: Not on file  Stress: Not on file  Social Connections: Not on file   Additional Social History:                         Sleep: Fair  Appetite:  Good  Current Medications: Current Facility-Administered Medications  Medication Dose Route Frequency Provider Last Rate Last Admin  . acetaminophen (TYLENOL) tablet 650 mg  650 mg Oral Q6H PRN Clapacs, Jackquline Denmark, MD   650 mg at 12/22/20 2232  . alum & mag  hydroxide-simeth (MAALOX/MYLANTA) 200-200-20 MG/5ML suspension 30 mL  30 mL Oral Q4H PRN Clapacs, John T, MD      . atorvastatin (LIPITOR) tablet 80 mg  80 mg Oral Daily Clapacs, Jackquline Denmark, MD   80 mg at 12/24/20 0749  . citalopram (CELEXA) tablet 20 mg  20 mg Oral QHS Jesse Sans, MD   20 mg at 12/23/20 2101  . clopidogrel (PLAVIX) tablet 75 mg  75 mg Oral Daily Jesse Sans, MD   75 mg at 12/24/20 0751  . docusate sodium (COLACE) capsule 100 mg  100 mg Oral BID Clapacs, Jackquline Denmark, MD   100 mg at 12/24/20 0750  . levETIRAcetam (KEPPRA) tablet 500 mg  500 mg Oral BID Clapacs, Jackquline Denmark, MD   500 mg at 12/24/20 0750  . LORazepam (ATIVAN) tablet 1-4 mg  1-4 mg Oral Q1H PRN Jesse Sans, MD   1 mg at 12/24/20 1129   Or  . LORazepam (ATIVAN) injection 1-4 mg  1-4 mg Intravenous Q1H PRN Jesse Sans, MD      . magnesium hydroxide (MILK OF MAGNESIA) suspension 30 mL  30 mL Oral Daily PRN Clapacs, John T, MD      . naltrexone (DEPADE) tablet 50 mg  50 mg Oral Daily Jesse Sans, MD   50 mg at 12/24/20 0750  . ondansetron (ZOFRAN-ODT) disintegrating tablet 4 mg  4 mg Oral Q8H PRN Clapacs, John T, MD      . pantoprazole (PROTONIX) EC tablet 40 mg  40 mg Oral Daily Clapacs, Jackquline Denmark, MD   40 mg at 12/24/20 0749  . thiamine tablet 100 mg  100 mg Oral Daily Clapacs, John T, MD   100 mg at 12/24/20 0749   Or  . thiamine (B-1) injection 100 mg  100 mg Intravenous Daily Clapacs, John T, MD      . traZODone (DESYREL) tablet 50 mg  50 mg Oral QHS PRN Jesse Sans, MD   50 mg at 12/23/20 2101    Lab Results: No results found for this or any previous visit (from the past 48 hour(s)).  Blood Alcohol level:  Lab Results  Component Value Date   ETH 227 (H) 12/20/2020   ETH 277 (H) 12/19/2020    Metabolic Disorder Labs: Lab Results  Component Value Date   HGBA1C 5.6 07/04/2020   MPG 114 07/04/2020   No results found for: PROLACTIN Lab Results  Component Value Date   CHOL 156 10/17/2020    TRIG 120 10/17/2020   HDL 43 10/17/2020   CHOLHDL 3.6 10/17/2020   VLDL UNABLE TO CALCULATE IF TRIGLYCERIDE OVER 400 mg/dL 29/52/8413   LDLCALC 91 10/17/2020   LDLCALC UNABLE TO CALCULATE IF TRIGLYCERIDE OVER 400 mg/dL 24/40/1027  Physical Findings: AIMS:  , ,  ,  ,    CIWA:  CIWA-Ar Total: 5 COWS:     Musculoskeletal: Strength & Muscle Tone: within normal limits Gait & Station: normal Patient leans: N/A  Psychiatric Specialty Exam:  Presentation  General Appearance: Disheveled  Eye Contact:Minimal  Speech:Normal Rate  Speech Volume:Normal  Handedness:Right   Mood and Affect  Mood:Anxious  Affect:Congruent   Thought Process  Thought Processes:Goal Directed  Descriptions of Associations:Intact  Orientation:Full (Time, Place and Person)  Thought Content:Perseveration  History of Schizophrenia/Schizoaffective disorder:No  Duration of Psychotic Symptoms:No data recorded Hallucinations:No data recorded  Ideas of Reference:None  Suicidal Thoughts:Denies Homicidal Thoughts:Denies  Sensorium  Memory:Immediate Fair; Recent Fair; Remote Fair  Judgment:Poor  Insight:Lacking   Executive Functions  Concentration:Poor  Attention Span:Poor  Recall:Poor  Fund of Knowledge:Fair  Language:Fair   Psychomotor Activity  Psychomotor Activity:No data recorded   Assets  Assets:Desire for Improvement; Social Support; Resilience; Housing; Talents/Skills   Sleep  Sleep:No data recorded    Physical Exam: Physical Exam ROS  Blood pressure (!) 130/94, pulse (!) 104, temperature 98.2 F (36.8 C), temperature source Oral, resp. rate 18, height 5\' 3"  (1.6 m), weight 75.3 kg, SpO2 100 %. Body mass index is 29.41 kg/m.   Treatment Plan Summary: Daily contact with patient to assess and evaluate symptoms and progress in treatment and Medication management 1) MDD, single episode, severe without psychotic features- new problem, improving - Continue  Celexa 20 mg daily  2) Severe alcohol use disorder- severe problem, unstable - Continue Natrexone 50 mg daily for alcohol cravings - CIWA protocol with PRN Ativan in place - MVI, folic acid, thiamine  3) History of CVA with residual anxiety anddecrease left hand dexterity - Continue Plavix 75 mg daily  4) HLD - Continue Lipitor 80 mg nightly  5) Seizure disorder - Continue Keppra 500 mg BID  12/24/20: Psychiatric exam above reviewed and remains accurate. Assessment and plan above reviewed and updated.    02/23/21, MD 12/24/2020, 1:41 PM

## 2020-12-24 NOTE — Progress Notes (Signed)
This writer assumed care of patient at 0300. Per report, patient was restless and anxious at bedtime, in which he requested medication for sleep, and has been resting since administration. Patient also reported to previous RN that he is worried about his job. Patient remains in bed, resting quietly, showing no acute signs of distress. Patient remains safe on the unit at this time.

## 2020-12-24 NOTE — Progress Notes (Signed)
Patient has been pleasant. Mood euphoric. Denies SI, HI and AVH. Denies withdrawal symptoms

## 2020-12-24 NOTE — Progress Notes (Signed)
Patient denies SI, HI, and AVH. Patient minimizes his symptoms but is observed to be anxious. He is restless, pacing the hallways, and preoccupied with discharge and returning home. Patient's BP was elevated, 130/94 at 11:30am. Patient CIWA was a 5. Patient called his family and has been cooperative with staff. Patient remains safe on the unit at this time and q15 min safety checks are maintained.

## 2020-12-25 ENCOUNTER — Other Ambulatory Visit: Payer: Self-pay

## 2020-12-25 NOTE — Discharge Summary (Signed)
Physician Discharge Summary Note  Patient:  Mark PillingKevin Hipolito is an 35 y.o., male MRN:  [id number] DOB:  04/07/1986 Patient phone:  [phone] (home)  Patient address:   [address] [place] [id number],  Total Time spent with patient: 35 minutes- 25 minutes face-to-face contact with patient, 10 minutes documentation, coordination of care, scripts   Date of Admission:  12/21/2020 Date of Discharge: 12/25/2020  Reason for Admission:  35 year old male IVC by family due to suicidal ideations, heavy alcohol use, and depression  Principal Problem: Severe major depression, single episode, without psychotic features (HCC) Discharge Diagnoses: Principal Problem:   Severe major depression, single episode, without psychotic features (HCC) Active Problems:   History of CVA with residual deficit   Seizures (HCC)   Hyperlipidemia LDL goal <70   Alcohol abuse   Past Psychiatric History: Unknown past psychiatric history. No evidence of past treatment for depression or substance abuse. Denies past suicide attempts.  Past Medical History:  Past Medical History:  Diagnosis Date  . 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 BUBBLE STUDY  07/06/2020   Procedure: BUBBLE STUDY;  Surgeon: Little IshikawaSchumann, Christopher L, MD;  [facility] END[facility];  Service: Cardiovascular;;  . IR ANGIO INTRA EXTRACRAN SEL COM CAROTID INNOMINATE UNI L MOD SED  07/03/2020  . IR CT HEAD LTD  07/03/2020  . IR CT HEAD LTD  07/03/2020  . IR PERCUTANEOUS ART THROMBECTOMY/INFUSION INTRACRANIAL INC DIAG ANGIO  07/03/2020  . IR US GUIDE VASC ACCESS RIGHT  07/03/2020  . RADIOLOGY WITH ANESTHESIA N/A 07/03/2020   Procedure: IR WITH ANESTHESIA;  Surgeon: Radiologist, Medication, MD;  Location: [facility] OR;  Service: Radiology;  Laterality: N/A;  . TEE WITHOUT CARDIOVERSION N/A 07/06/2020   Procedure: TRANSESOPHAGEAL ECHOCARDIOGRAM (TEE);  Surgeon: Little IshikawaSchumann, Christopher L,  MD;  [facility];  Service: Cardiovascular;  Laterality: N/A;   Family History:  Family History  Problem Relation Age of Onset  . Hypertension Mother   . Hypertension Father    Family Psychiatric  History: Denies Social History:  Social History   Substance and Sexual Activity  Alcohol Use Yes  . Alcohol/week: 3.0 standard drinks  . Types: 3 Cans of beer per week   Comment: 20 ounce cans     Social History   Substance and Sexual Activity  Drug Use Not Currently    Social History   Socioeconomic History  . Marital status: Single    Spouse name: [name]  . Number of children: 2  . Years of education: Not on file  . Highest education level: Not on file  Occupational History  . Not on file  Tobacco Use  . Smoking status: Former Smoker    Types: Cigarettes  . Smokeless tobacco: Never Used  . Tobacco comment: quit after stroke in 2020  Substance and Sexual Activity  . Alcohol use: Yes    Alcohol/week: 3.0 standard drinks    Types: 3 Cans of beer per week    Comment: 20 ounce cans  . Drug use: Not Currently  . Sexual activity: Not on file  Other Topics Concern  . Not on file  Social History Narrative   Lives with his partner of 8 years and has 2 kids with her. All of his family is in a different country.   Social Determinants of Health   Financial Resource Strain: Not on file  Food Insecurity: Not on file  Transportation Needs: Not on file  Physical  Activity: Not on file  Stress: Not on file  Social Connections: Not on file    Hospital Course:   35 year old male IVC by family due to suicidal ideations, heavy alcohol use, and depression. While he was in the hospital he was restarted on his home medications. Celexa was increased to 20 mg daily. He was also started on Naltrexone 50 mg daily for alcohol use disorder. He completed acute alcohol detox without complications with PRN Ativan per CIWA protocol. Mood gradually brightened. He denies suicidal  ideations, homicidal ideations, visual hallucinations, and auditory hallucinations at time of discharge.   Physical Findings: AIMS:  , ,  ,  ,    CIWA:  CIWA-Ar Total: 0 COWS:     Musculoskeletal: Strength & Muscle Tone: within normal limits Gait & Station: normal Patient leans: N/A   Psychiatric Specialty Exam: General Appearance: Fairly Groomed  Patent attorney::  Good  Speech:  Clear and Coherent and Normal Rate  Volume:  Normal  Mood:  Euthymic  Affect:  Congruent  Thought Process:  Coherent and Linear  Orientation:  Full (Time, Place, and Person)  Thought Content:  Logical  Suicidal Thoughts:  No  Homicidal Thoughts:  No  Memory:  Immediate;   Fair Recent;   Fair Remote;   Fair  Judgement:  Intact  Insight:  Shallow  Psychomotor Activity:  Normal  Concentration:  Fair  Recall:  [employer] of Knowledge:Fair  Language: Fair  Akathisia:  Negative  Handed:  Right  AIMS (if indicated):     Assets:  Communication Skills Desire for Improvement Housing Intimacy Resilience Social Support Talents/Skills Transportation Vocational/Educational  Sleep:  Number of Hours: 8  Cognition: WNL  ADL's:  Intact     Physical Exam: Physical Exam Vitals and nursing note reviewed.  Constitutional:      Appearance: Normal appearance.  HENT:     Head: Normocephalic and atraumatic.     Right Ear: External ear normal.     Left Ear: External ear normal.     Nose: Nose normal.     Mouth/Throat:     Mouth: Mucous membranes are moist.     Pharynx: Oropharynx is clear.  Eyes:     Extraocular Movements: Extraocular movements intact.     Conjunctiva/sclera: Conjunctivae normal.     Pupils: Pupils are equal, round, and reactive to light.  Cardiovascular:     Rate and Rhythm: Normal rate and regular rhythm.     Pulses: Normal pulses.  Pulmonary:     Effort: Pulmonary effort is normal.  Abdominal:     General: Abdomen is flat.     Palpations: Abdomen is soft.  Musculoskeletal:         General: No swelling. Normal range of motion.     Cervical back: Normal range of motion and neck supple.  Skin:    General: Skin is warm and dry.  Neurological:     Mental Status: He is alert and oriented to person, place, and time. Mental status is at baseline.  Psychiatric:        Mood and Affect: Mood normal.        Behavior: Behavior normal.        Thought Content: Thought content normal.        Judgment: Judgment normal.    Review of Systems  Constitutional: Negative.   HENT: Negative.   Eyes: Negative.   Respiratory: Negative.   Cardiovascular: Negative.   Gastrointestinal: Negative.   Endocrine: Negative.  Genitourinary: Negative.   Musculoskeletal: Negative.   Skin: Negative.   Allergic/Immunologic: Negative.   Neurological: Negative.   Hematological: Negative.   Psychiatric/Behavioral: Negative for agitation, behavioral problems, dysphoric mood, hallucinations, sleep disturbance and suicidal ideas. The patient is not nervous/anxious.   Blood pressure (!) 123/92, pulse 78, temperature 98.2 F (36.8 C), temperature source Oral, resp. rate 18, height 5\' 3"  (1.6 m), weight 75.3 kg, SpO2 100 %. Body mass index is 29.41 kg/m.      Has this patient used any form of tobacco in the last 30 days? (Cigarettes, Smokeless Tobacco, Cigars, and/or Pipes) No  Blood Alcohol level:  Lab Results  Component Value Date   ETH 227 (H) 12/20/2020   ETH 277 (H) 12/19/2020    Metabolic Disorder Labs:  Lab Results  Component Value Date   HGBA1C 5.6 07/04/2020   MPG 114 07/04/2020   No results found for: PROLACTIN Lab Results  Component Value Date   CHOL 156 10/17/2020   TRIG 120 10/17/2020   HDL 43 10/17/2020   CHOLHDL 3.6 10/17/2020   VLDL UNABLE TO CALCULATE IF TRIGLYCERIDE OVER 400 mg/dL 12/15/2020   LDLCALC 91 10/17/2020   LDLCALC UNABLE TO CALCULATE IF TRIGLYCERIDE OVER 400 mg/dL 12/15/2020    See Psychiatric Specialty Exam and Suicide Risk Assessment completed  by Attending Physician prior to discharge.  Discharge destination:  Home  Is patient on multiple antipsychotic therapies at discharge:  No   Has Patient had three or more failed trials of antipsychotic monotherapy by history:  No  Recommended Plan for Multiple Antipsychotic Therapies: NA  Discharge Instructions    Diet - low sodium heart healthy   Complete by: As directed    Increase activity slowly   Complete by: As directed      Allergies as of 12/25/2020   No Known Allergies     Medication List    TAKE these medications     Indication  atorvastatin 80 MG tablet Commonly known as: LIPITOR Take 1 tablet (80 mg total) by mouth daily. What changed: See the new instructions.  Indication: High Amount of Fats in the Blood   citalopram 20 MG tablet Commonly known as: CELEXA Take 1 tablet (20 mg total) by mouth at bedtime. What changed:   medication strength  See the new instructions.  Indication: Depression, Generalized Anxiety Disorder   clopidogrel 75 MG tablet Commonly known as: PLAVIX Take 1 tablet (75 mg total) by mouth daily. What changed: See the new instructions.  Indication: Cerebrovascular Accident or Stroke   docusate sodium 100 MG capsule Commonly known as: COLACE TAKE 1 CAPSULE (100 MG TOTAL) BY MOUTH 2 (TWO) TIMES DAILY.  Indication: Constipation   levETIRAcetam 500 MG tablet Commonly known as: KEPPRA Take 1 tablet (500 mg total) by mouth 2 (two) times daily.  Indication: Seizure   multivitamin with minerals Tabs tablet Take 1 tablet by mouth daily.  Indication: vitamin deficiency   naltrexone 50 MG tablet Commonly known as: DEPADE Take 1 tablet (50 mg total) by mouth daily.  Indication: Abuse or Misuse of Alcohol   pantoprazole 40 MG tablet Commonly known as: PROTONIX Take 1 tablet (40 mg total) by mouth daily. What changed: when to take this  Indication: Gastroesophageal Reflux Disease   senna-docusate 8.6-50 MG tablet Commonly known  as: Senokot-S Take 1 tablet by mouth 2 (two) times daily.  Indication: Constipation   traZODone 50 MG tablet Commonly known as: DESYREL Take 1 tablet (50 mg total) by mouth at  bedtime as needed for sleep.  Indication: Trouble Sleeping       Follow-up Information    [facility] Follow up on 12/27/2020.   Why: Go to hospital follow up appointment scheduled for Wednesday 9/13 at 9:30am (in-person) Contact information: [address] [place] [address] [phone]               Follow-up recommendations:  Activity:  as tolerated Diet:  low sodium heart healthy diet  Comments:  7-day supply free medication provided to patient at discharge along with printed 30-day scripts with 1 refill.   Signed: Jesse Sans, MD 12/25/2020, 9:06 AM

## 2020-12-25 NOTE — Progress Notes (Signed)
Pt denies SI, HI and AVH. Pt was educated on dc plan, AVS and verbalizes understanding. Pt received dc packet with AVS,  medications, prescriptions, note and belongings. Torrie Mayers RN

## 2020-12-25 NOTE — Progress Notes (Signed)
Recreation Therapy Notes  INPATIENT RECREATION TR PLAN  Patient Details Name: Mark Oneal MRN: [id number] DOB: 1985/11/07 Today's Date: 12/25/2020  Rec Therapy Plan Is patient appropriate for Therapeutic Recreation?: Yes Treatment times per week: at least 3 Estimated Length of Stay: 5-7 days TR Treatment/Interventions: Group participation (Comment)  Discharge Criteria Pt will be discharged from therapy if:: Discharged Treatment plan/goals/alternatives discussed and agreed upon by:: Patient/family  Discharge Summary Short term goals set: Patient will engage in groups without prompting or encouragement from LRT x3 group sessions within 5 recreation therapy group sessions Short term goals met: Not met Reason goals not met: Patient did not attend any groups Therapeutic equipment acquired: N/A Reason patient discharged from therapy: Discharge from hospital Pt/family agrees with progress & goals achieved: Yes Date patient discharged from therapy: 12/25/20   [name] 12/25/2020, 12:11 PM

## 2020-12-25 NOTE — Plan of Care (Signed)
Pt denies depression, anxiety, SI, HI and AVH. Pt was educated on care plan and verbalizes understanding. Pt anticipates discharge. Mark Mayers RN Problem: Education: Goal: Knowledge of [facility] Education information/materials will improve Outcome: Progressing Goal: Emotional status will improve Outcome: Progressing Goal: Mental status will improve Outcome: Progressing Goal: Verbalization of understanding the information provided will improve Outcome: Progressing   Problem: Activity: Goal: Interest or engagement in activities will improve Outcome: Progressing Goal: Sleeping patterns will improve Outcome: Progressing   Problem: Coping: Goal: Ability to verbalize frustrations and anger appropriately will improve Outcome: Progressing Goal: Ability to demonstrate self-control will improve Outcome: Progressing   Problem: Health Behavior/Discharge Planning: Goal: Identification of resources available to assist in meeting health care needs will improve Outcome: Progressing Goal: Compliance with treatment plan for underlying cause of condition will improve Outcome: Progressing   Problem: Safety: Goal: Periods of time without injury will increase Outcome: Progressing   Problem: Education: Goal: Utilization of techniques to improve thought processes will improve Outcome: Progressing Goal: Knowledge of the prescribed therapeutic regimen will improve Outcome: Progressing   Problem: Activity: Goal: Interest or engagement in leisure activities will improve Outcome: Progressing Goal: Imbalance in normal sleep/wake cycle will improve Outcome: Progressing   Problem: Coping: Goal: Coping ability will improve Outcome: Progressing Goal: Will verbalize feelings Outcome: Progressing   Problem: Health Behavior/Discharge Planning: Goal: Ability to make decisions will improve Outcome: Progressing Goal: Compliance with therapeutic regimen will improve Outcome: Progressing    Problem: Role Relationship: Goal: Will demonstrate positive changes in social behaviors and relationships Outcome: Progressing   Problem: Safety: Goal: Ability to disclose and discuss suicidal ideas will improve Outcome: Progressing Goal: Ability to identify and utilize support systems that promote safety will improve Outcome: Progressing   Problem: Self-Concept: Goal: Will verbalize positive feelings about self Outcome: Progressing Goal: Level of anxiety will decrease Outcome: Progressing   Problem: Education: Goal: Knowledge of disease or condition will improve Outcome: Progressing Goal: Understanding of discharge needs will improve Outcome: Progressing   Problem: Health Behavior/Discharge Planning: Goal: Ability to identify changes in lifestyle to reduce recurrence of condition will improve Outcome: Progressing Goal: Identification of resources available to assist in meeting health care needs will improve Outcome: Progressing   Problem: Physical Regulation: Goal: Complications related to the disease process, condition or treatment will be avoided or minimized Outcome: Progressing   Problem: Safety: Goal: Ability to remain free from injury will improve Outcome: Progressing

## 2020-12-25 NOTE — BHH Suicide Risk Assessment (Signed)
[facility] Discharge Suicide Risk Assessment   Principal Problem: Severe major depression, single episode, without psychotic features [facility]) Discharge Diagnoses: Principal Problem:   Severe major depression, single episode, without psychotic features (HCC) Active Problems:   History of CVA with residual deficit   Seizures (HCC)   Hyperlipidemia LDL goal <70   Alcohol abuse   Total Time spent with patient: 35 minutes- 25 minutes face-to-face contact with patient, 10 minutes documentation, coordination of care, scripts   Musculoskeletal: Strength & Muscle Tone: within normal limits Gait & Station: normal Patient leans: N/A  Psychiatric Specialty Exam: Review of Systems  Constitutional: Negative.   HENT: Negative.   Eyes: Negative.   Respiratory: Negative.   Cardiovascular: Negative.   Gastrointestinal: Negative.   Endocrine: Negative.   Genitourinary: Negative.   Musculoskeletal: Negative.   Skin: Negative.   Allergic/Immunologic: Negative.   Neurological: Negative.   Hematological: Negative.   Psychiatric/Behavioral: Negative for agitation, behavioral problems, dysphoric mood, hallucinations, sleep disturbance and suicidal ideas. The patient is not nervous/anxious.     Blood pressure (!) 123/92, pulse 78, temperature 98.2 F (36.8 C), temperature source Oral, resp. rate 18, height 5\' 3"  (1.6 m), weight 75.3 kg, SpO2 100 %.Body mass index is 29.41 kg/m.  General Appearance: Fairly Groomed  ::  Good  Speech:  Clear and Coherent and Normal Rate  Volume:  Normal  Mood:  Euthymic  Affect:  Congruent  Thought Process:  Coherent and Linear  Orientation:  Full (Time, Place, and Person)  Thought Content:  Logical  Suicidal Thoughts:  No  Homicidal Thoughts:  No  Memory:  Immediate;   Fair Recent;   Fair Remote;   Fair  Judgement:  Intact  Insight:  Shallow  Psychomotor Activity:  Normal  Concentration:  Fair  Recall:  [id number] of Knowledge:Fair  Language: Fair   Akathisia:  Negative  Handed:  Right  AIMS (if indicated):     Assets:  Communication Skills Desire for Improvement Housing Intimacy Resilience Social Support Talents/Skills Transportation Vocational/Educational  Sleep:  Number of Hours: 8  Cognition: WNL  ADL's:  Intact   Mental Status Per Nursing Assessment::   On Admission:  NA  Demographic Factors:  Male  Loss Factors: NA  Historical Factors: Impulsivity  Risk Reduction Factors:   Responsible for children under 46 years of age, Sense of responsibility to family, Religious beliefs about death, Employed, Living with another person, especially a relative, Positive social support, Positive therapeutic relationship and Positive coping skills or problem solving skills  Continued Clinical Symptoms:  Severe Anxiety and/or Agitation Depression:   Recent sense of peace/wellbeing Alcohol/Substance Abuse/Dependencies Previous Psychiatric Diagnoses and Treatments  Cognitive Features That Contribute To Risk:  None    Suicide Risk:  Minimal: No identifiable suicidal ideation.  Patients presenting with no risk factors but with morbid ruminations; may be classified as minimal risk based on the severity of the depressive symptoms   Follow-up Information    [facility] Follow up on 12/27/2020.   Why: Go to hospital follow up appointment scheduled for Wednesday 9/13 at 9:30am (in-person) Contact information: [address] [place] [phone]               Plan Of Care/Follow-up recommendations:  Activity:  as tolerated Diet:  low sodium heart healthy diet  [phone], MD 12/25/2020, 9:04 AM

## 2020-12-25 NOTE — Progress Notes (Signed)
Recreation Therapy Notes  Date: 12/25/2020  Time: 10:00 am   Location: Craft room    Behavioral response: N/A   Intervention Topic: Self-care   Discussion/Intervention: Patient did not attend group.   Clinical Observations/Feedback:  Patient did not attend group.   Deklynn Charlet LRT/CTRS        Alyxandria Wentz 12/25/2020 12:01 PM

## 2020-12-25 NOTE — Plan of Care (Signed)
  Problem: Group Participation Goal: STG - Patient will engage in groups without prompting or encouragement from LRT x3 group sessions within 5 recreation therapy group sessions Description: STG - Patient will engage in groups without prompting or encouragement from LRT x3 group sessions within 5 recreation therapy group sessions 12/25/2020 1209 by Ernest Haber, LRT Outcome: Not Applicable 06/05/1006 1219 by Ernest Haber, LRT Outcome: Not Met (add Reason) Note: Patient did not attend any groups.

## 2020-12-25 NOTE — Progress Notes (Signed)
  [facility] Adult Case Management Discharge Plan :  Will you be returning to the same living situation after discharge:  Yes,  pt's home At discharge, do you have transportation home?: Yes,  pt's family member Do you have the ability to pay for your medications: No.  Release of information consent forms completed and in the chart;  Patient's signature needed at discharge.  Patient to Follow up at:  Follow-up Information    [facility] Follow up.   Specialty: Behavioral Health Why: Go to walk-in hours for new patient initial appointments from Tuesday, Wednesday, or Thursday from 9 am-12 pm, thanks! Contact information: [address] [place] [address] [phone]               Next level of care provider has access to [facility] Link:no  Safety Planning and Suicide Prevention discussed: Yes,  pt's spouse     Has patient been referred to the Quitline?: Patient refused referral  Patient has been referred for addiction treatment: Pt. refused referral  Alberto Schoch A [place], LCSWA 12/25/2020, 10:04 AM

## 2020-12-26 ENCOUNTER — Other Ambulatory Visit: Payer: Self-pay

## 2021-01-19 ENCOUNTER — Other Ambulatory Visit: Payer: Self-pay

## 2021-01-24 ENCOUNTER — Encounter: Payer: Self-pay | Attending: Physical Medicine & Rehabilitation | Admitting: Physical Medicine & Rehabilitation

## 2021-02-20 ENCOUNTER — Ambulatory Visit: Payer: Self-pay | Admitting: Adult Health

## 2021-03-10 ENCOUNTER — Other Ambulatory Visit: Payer: Self-pay | Admitting: Physical Medicine & Rehabilitation

## 2021-03-10 DIAGNOSIS — I63411 Cerebral infarction due to embolism of right middle cerebral artery: Secondary | ICD-10-CM

## 2021-03-10 DIAGNOSIS — R569 Unspecified convulsions: Secondary | ICD-10-CM

## 2021-04-07 ENCOUNTER — Emergency Department
Admission: EM | Admit: 2021-04-07 | Discharge: 2021-04-09 | Disposition: A | Discharge status: Home / Self Care | Payer: Self-pay | Attending: Emergency Medicine | Admitting: Emergency Medicine

## 2021-04-07 ENCOUNTER — Other Ambulatory Visit: Payer: Self-pay

## 2021-04-07 ENCOUNTER — Encounter: Payer: Self-pay | Admitting: Emergency Medicine

## 2021-04-07 DIAGNOSIS — F10220 Alcohol dependence with intoxication, uncomplicated: Secondary | ICD-10-CM

## 2021-04-07 DIAGNOSIS — Z20822 Contact with and (suspected) exposure to covid-19: Secondary | ICD-10-CM | POA: Insufficient documentation

## 2021-04-07 DIAGNOSIS — F322 Major depressive disorder, single episode, severe without psychotic features: Secondary | ICD-10-CM | POA: Diagnosis present

## 2021-04-07 DIAGNOSIS — R45851 Suicidal ideations: Secondary | ICD-10-CM | POA: Insufficient documentation

## 2021-04-07 DIAGNOSIS — F1010 Alcohol abuse, uncomplicated: Secondary | ICD-10-CM | POA: Diagnosis present

## 2021-04-07 DIAGNOSIS — F1994 Other psychoactive substance use, unspecified with psychoactive substance-induced mood disorder: Secondary | ICD-10-CM

## 2021-04-07 DIAGNOSIS — Z87891 Personal history of nicotine dependence: Secondary | ICD-10-CM | POA: Insufficient documentation

## 2021-04-07 DIAGNOSIS — Z7902 Long term (current) use of antithrombotics/antiplatelets: Secondary | ICD-10-CM | POA: Insufficient documentation

## 2021-04-07 DIAGNOSIS — Y906 Blood alcohol level of 120-199 mg/100 ml: Secondary | ICD-10-CM | POA: Insufficient documentation

## 2021-04-07 DIAGNOSIS — F1924 Other psychoactive substance dependence with psychoactive substance-induced mood disorder: Secondary | ICD-10-CM | POA: Insufficient documentation

## 2021-04-07 DIAGNOSIS — F1020 Alcohol dependence, uncomplicated: Secondary | ICD-10-CM | POA: Insufficient documentation

## 2021-04-07 LAB — CBC
HCT: 42.2 % (ref 39.0–52.0)
Hemoglobin: 15.2 g/dL (ref 13.0–17.0)
MCH: 32.9 pg (ref 26.0–34.0)
MCHC: 36 g/dL (ref 30.0–36.0)
MCV: 91.3 fL (ref 80.0–100.0)
Platelets: 299 K/uL (ref 150–400)
RBC: 4.62 MIL/uL (ref 4.22–5.81)
RDW: 12 % (ref 11.5–15.5)
WBC: 10.9 K/uL — ABNORMAL HIGH (ref 4.0–10.5)
nRBC: 0 % (ref 0.0–0.2)

## 2021-04-07 LAB — COMPREHENSIVE METABOLIC PANEL
ALT: 33 U/L (ref 0–44)
AST: 47 U/L — ABNORMAL HIGH (ref 15–41)
Albumin: 4.5 g/dL (ref 3.5–5.0)
Alkaline Phosphatase: 126 U/L (ref 38–126)
Anion gap: 8 (ref 5–15)
BUN: 13 mg/dL (ref 6–20)
CO2: 25 mmol/L (ref 22–32)
Calcium: 9.2 mg/dL (ref 8.9–10.3)
Chloride: 105 mmol/L (ref 98–111)
Creatinine, Ser: 0.82 mg/dL (ref 0.61–1.24)
GFR, Estimated: >60 mL/min (ref >60)
Glucose, Bld: 109 mg/dL — ABNORMAL HIGH (ref 70–99)
Potassium: 3.8 mmol/L (ref 3.5–5.1)
Sodium: 138 mmol/L (ref 135–145)
Total Bilirubin: 0.7 mg/dL (ref 0.3–1.2)
Total Protein: 7.8 g/dL (ref 6.5–8.1)

## 2021-04-07 LAB — URINE DRUG SCREEN, QUALITATIVE (ARMC ONLY)
Amphetamines, Ur Screen: NOT DETECTED
Barbiturates, Ur Screen: NOT DETECTED
Benzodiazepine, Ur Scrn: NOT DETECTED
Cannabinoid 50 Ng, Ur ~~LOC~~: NOT DETECTED
Cocaine Metabolite,Ur ~~LOC~~: NOT DETECTED
MDMA (Ecstasy)Ur Screen: NOT DETECTED
Methadone Scn, Ur: NOT DETECTED
Opiate, Ur Screen: NOT DETECTED
Phencyclidine (PCP) Ur S: NOT DETECTED
Tricyclic, Ur Screen: NOT DETECTED

## 2021-04-07 LAB — RESP PANEL BY RT-PCR (FLU A&B, COVID) ARPGX2
Influenza A by PCR: NEGATIVE
Influenza B by PCR: NEGATIVE
SARS Coronavirus 2 by RT PCR: NEGATIVE

## 2021-04-07 LAB — ETHANOL: Alcohol, Ethyl (B): 159 mg/dL — ABNORMAL HIGH

## 2021-04-07 LAB — SALICYLATE LEVEL: Salicylate Lvl: <7 mg/dL — ABNORMAL LOW (ref 7.0–30.0)

## 2021-04-07 LAB — ACETAMINOPHEN LEVEL: Acetaminophen (Tylenol), Serum: <10 ug/mL — ABNORMAL LOW (ref 10–30)

## 2021-04-07 MED ORDER — ATORVASTATIN CALCIUM 20 MG PO TABS
80.0000 mg | ORAL_TABLET | Freq: Every day | ORAL | Status: DC
  Administered 2021-04-08 – 2021-04-09 (×2): 80 mg via ORAL
  Filled 2021-04-07 (×2): qty 4

## 2021-04-07 MED ORDER — LORAZEPAM 2 MG PO TABS: 0.0000 mg | ORAL_TABLET | Freq: Two times a day (BID) | ORAL | Status: DC

## 2021-04-07 MED ORDER — LORAZEPAM 2 MG/ML IJ SOLN: 0.0000 mg | Freq: Four times a day (QID) | INTRAMUSCULAR | Status: AC

## 2021-04-07 MED ORDER — SENNOSIDES-DOCUSATE SODIUM 8.6-50 MG PO TABS
1.0000 | ORAL_TABLET | Freq: Two times a day (BID) | ORAL | Status: DC
  Administered 2021-04-07 – 2021-04-09 (×4): 1 via ORAL
  Filled 2021-04-07 (×5): qty 1

## 2021-04-07 MED ORDER — LORAZEPAM 2 MG/ML IJ SOLN: 0.0000 mg | Freq: Two times a day (BID) | INTRAMUSCULAR | Status: DC

## 2021-04-07 MED ORDER — PANTOPRAZOLE SODIUM 40 MG PO TBEC
40.0000 mg | DELAYED_RELEASE_TABLET | Freq: Every day | ORAL | Status: DC
  Administered 2021-04-07 – 2021-04-09 (×3): 40 mg via ORAL
  Filled 2021-04-07 (×3): qty 1

## 2021-04-07 MED ORDER — LEVETIRACETAM 500 MG PO TABS
500.0000 mg | ORAL_TABLET | Freq: Two times a day (BID) | ORAL | Status: DC
  Administered 2021-04-07 – 2021-04-09 (×4): 500 mg via ORAL
  Filled 2021-04-07 (×7): qty 1

## 2021-04-07 MED ORDER — THIAMINE HCL 100 MG PO TABS
100.0000 mg | ORAL_TABLET | Freq: Every day | ORAL | Status: DC
  Administered 2021-04-07 – 2021-04-09 (×3): 100 mg via ORAL
  Filled 2021-04-07 (×3): qty 1

## 2021-04-07 MED ORDER — CITALOPRAM HYDROBROMIDE 20 MG PO TABS
20.0000 mg | ORAL_TABLET | Freq: Every day | ORAL | Status: DC
  Administered 2021-04-07 – 2021-04-08 (×2): 20 mg via ORAL
  Filled 2021-04-07 (×2): qty 1

## 2021-04-07 MED ORDER — LORAZEPAM 2 MG PO TABS
0.0000 mg | ORAL_TABLET | Freq: Four times a day (QID) | ORAL | Status: AC
  Administered 2021-04-08: 1 mg via ORAL
  Filled 2021-04-07: qty 1

## 2021-04-07 MED ORDER — DOCUSATE SODIUM 100 MG PO CAPS
100.0000 mg | ORAL_CAPSULE | Freq: Every day | ORAL | Status: DC
  Administered 2021-04-07 – 2021-04-09 (×3): 100 mg via ORAL
  Filled 2021-04-07 (×3): qty 1

## 2021-04-07 MED ORDER — NALTREXONE HCL 50 MG PO TABS
50.0000 mg | ORAL_TABLET | Freq: Every day | ORAL | Status: DC
  Administered 2021-04-08 – 2021-04-09 (×2): 50 mg via ORAL
  Filled 2021-04-07 (×4): qty 1

## 2021-04-07 MED ORDER — TRAZODONE HCL 50 MG PO TABS
50.0000 mg | ORAL_TABLET | Freq: Every evening | ORAL | Status: DC | PRN
Start: 2021-04-07 — End: 2021-04-09
  Administered 2021-04-07 – 2021-04-08 (×3): 50 mg via ORAL
  Filled 2021-04-07 (×3): qty 1

## 2021-04-07 MED ORDER — THIAMINE HCL 100 MG/ML IJ SOLN
100.0000 mg | Freq: Every day | INTRAMUSCULAR | Status: DC
  Filled 2021-04-07 (×2): qty 1

## 2021-04-07 MED ORDER — CLOPIDOGREL BISULFATE 75 MG PO TABS
75.0000 mg | ORAL_TABLET | Freq: Every day | ORAL | Status: DC
  Administered 2021-04-07 – 2021-04-09 (×3): 75 mg via ORAL
  Filled 2021-04-07 (×3): qty 1

## 2021-04-07 NOTE — BH Assessment (Signed)
Patient gave Clinical research associate the contact number for his stepson ([phone]) but Clinical research associate was unable to reach him. He was also unable to leave a voicemail message because the mailbox was full.  Writer called and left a HIPPA Compliant message with the wife Kathlene November Osegvera-[phone]), requesting a return phone call.
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
## 2021-04-07 NOTE — ED Notes (Signed)
IVC 

## 2021-04-07 NOTE — ED Notes (Signed)
Pt given dinner tray.

## 2021-04-07 NOTE — ED Notes (Signed)
Awaiting med rec before giving medications

## 2021-04-07 NOTE — ED Notes (Signed)
pts belongings  Wallet  Cigarettes and lighter Keys Phone Change/coins

## 2021-04-07 NOTE — BH Assessment (Signed)
Comprehensive Clinical Assessment (CCA) Note  04/07/2021 [facility] [id number]  Mark Oneal is a 35 year old male who presents to the ER via law enforcement, under IVC. Per the patient, his stepson called the police because he was arguing with his wife, stepson's mother. Patient further reports, he believes the son may have thought he was out of control because had drank a beer. When sharing this, the patient wanted to make sure the writer understood he had "only one beer." He states he and the wife was yelling but he did not hit her or anyone else nor did he break anything.  Per the IVC, the stepson reported the patient stated he was going to kill himself. The night before he started acting crazy and this morning he had a knife was threatening the petitioner and in while his younger sister was present. He was hitting walls and he uses alcohol and cocaine. His UDS was negative of any mind-altering substances and his BAC was 159. IVC also says the patient was committed (IVC) approximately two months ago and was held for a week because he was suicidal but he continues to be suicidal. He is also making threats to the rest of the family that he would stab anyone that tries to take his kids from him.  During the interview, the patient was cooperative and pleasant. He was able to answer questions with appropriate answers. Throughout the interview he denied SI/HI and AV/H. Patient was intoxicated and animate as he answered questions. Upon arrival to the ER, his BAC was 159. He kept asking writer if he could be discharged from the ER, so he can go home and be with his family. "See, look at me. Nothing is wrong. I'm fine. I no hurt myself or anyone. Call my stepson, he'll tell you. I'm fine. I'm not loco."  Chief Complaint:  Chief Complaint  Patient presents with   Aggressive Behavior   Visit Diagnosis: Alcohol Use Disorder    CCA Screening, Triage and Referral (STR)  Patient Reported Information How  did you hear about [place]? Family/Friend  What Is the Reason for Your Visit/Call Today? IVC'ed by family due to his behaviors.  How Long Has This Been Causing You Problems? <Week  What Do You Feel Would Help You the Most Today? Treatment for Depression or other mood problem; Alcohol or Drug Use Treatment   Have You Recently Had Any Thoughts About Hurting Yourself? No  Are You Planning to Commit Suicide/Harm Yourself At This time? No   Have you Recently Had Thoughts About Hurting Someone Mark Oneal? No  Are You Planning to Harm Someone at This Time? No  Explanation: No data recorded  Have You Used Any Alcohol or Drugs in the Past 24 Hours? No  How Long Ago Did You Use Drugs or Alcohol? No data recorded What Did You Use and How Much? Alcohol "5-6 beers"   Do You Currently Have a Therapist/Psychiatrist? No  Name of Therapist/Psychiatrist: No data recorded  Have You Been Recently Discharged From Any Office Practice or Programs? No  Explanation of Discharge From Practice/Program: No data recorded    CCA Screening Triage Referral Assessment Type of Contact: Face-to-Face  Telemedicine Service Delivery:   Is this Initial or Reassessment? No data recorded Date Telepsych consult ordered in [facility]:  No data recorded Time Telepsych consult ordered in [facility]:  No data recorded Location of Assessment: [facility] ED  Provider Location: [facility] ED   Collateral Involvement: No data recorded  Does Patient Have a Court  Appointed Legal Guardian? No data recorded Name and Contact of Legal Guardian: No data recorded If Minor and Not Living with Parent(s), Who has Custody? No data recorded Is CPS involved or ever been involved? Never  Is APS involved or ever been involved? Never   Patient Determined To Be At Risk for Harm To Self or Others Based on Review of Patient Reported Information or Presenting Complaint? No  Method: No data recorded Availability of Means: No data recorded Intent: No data  recorded Notification Required: No data recorded Additional Information for Danger to Others Potential: No data recorded Additional Comments for Danger to Others Potential: No data recorded Are There Guns or Other Weapons in Your Home? No data recorded Types of Guns/Weapons: No data recorded Are These Weapons Safely Secured?                            No data recorded Who Could Verify You Are Able To Have These Secured: No data recorded Do You Have any Outstanding Charges, Pending Court Dates, Parole/Probation? No data recorded Contacted To Inform of Risk of Harm To Self or Others: No data recorded   Does Patient Present under Involuntary Commitment? Yes  IVC Papers Initial File Date: 04/07/21   [place] of Residence: [place]   Patient Currently Receiving the Following Services: Not Receiving Services   Determination of Need: Emergent (2 hours)   Options For Referral: ED Visit     CCA Biopsychosocial Patient Reported Schizophrenia/Schizoaffective Diagnosis in Past: No   Strengths: Have support, able to speak English and have some insight.   Mental Health Symptoms Depression:   None   Duration of Depressive symptoms:    Mania:   None   Anxiety:    None   Psychosis:   None   Duration of Psychotic symptoms:    Trauma:   None   Obsessions:   None   Compulsions:   None   Inattention:   None   Hyperactivity/Impulsivity:   None   Oppositional/Defiant Behaviors:   None   Emotional Irregularity:   None   Other Mood/Personality Symptoms:  No data recorded   Mental Status Exam Appearance and self-care  Stature:   Small   Weight:   Average weight   Clothing:   Neat/clean; Age-appropriate   Grooming:   Normal   Cosmetic use:   None   Posture/gait:   Normal   Motor activity:   -- (within normal range)   Sensorium  Attention:   Normal   Concentration:   Normal   Orientation:   X5   Recall/memory:   Normal   Affect and  Mood  Affect:   Appropriate   Mood:   Anxious   Relating  Eye contact:   Normal   Facial expression:   Anxious; Responsive   Attitude toward examiner:   Cooperative; Defensive   Thought and Language  Speech flow:  Clear and Coherent   Thought content:   Appropriate to Mood and Circumstances   Preoccupation:   None   Hallucinations:   None   Organization:  No data recorded  [employer] of Knowledge:   Fair   Intelligence:   Average   Abstraction:   Functional   Judgement:   Impaired   Reality Testing:   Realistic   Insight:   Fair   Decision Making:   Impulsive   Social Functioning  Social Maturity:   Impulsive  Social Judgement:   Normal; "Garment/textile technologisttreet Smart"   Stress  Stressors:   Family conflict   Coping Ability:   Normal   Skill Deficits:   None   Supports:   Family     Religion: Religion/Spirituality Are You A Religious Person?: No  Leisure/Recreation: Leisure / Recreation Do You Have Hobbies?: No  Exercise/Diet: Exercise/Diet Do You Exercise?: No Have You Gained or Lost A Significant Amount of Weight in the Past Six Months?: No Do You Follow a Special Diet?: No Do You Have Any Trouble Sleeping?: No   CCA Employment/Education Employment/Work Situation: Employment / Work Situation Has Patient ever Been in Equities traderthe Military?: No  Education: Education Is Patient Currently Attending School?: No Did Theme park managerYou Attend College?: No Did You Have An Individualized Education Program (IIEP): No Did You Have Any Difficulty At Progress EnergySchool?: No Patient's Education Has Been Impacted by Current Illness: No   CCA Family/Childhood History Family and Relationship History: Family history Marital status: Married Does patient have children?: Yes How many children?: 2 How is patient's relationship with their children?: He states it's good  Childhood History:  Childhood History Did patient suffer any  verbal/emotional/physical/sexual abuse as a child?: No Did patient suffer from severe childhood neglect?: No Has patient ever been sexually abused/assaulted/raped as an adolescent or adult?: No Was the patient ever a victim of a crime or a disaster?: No Witnessed domestic violence?: No Has patient been affected by domestic violence as an adult?: No  Child/Adolescent Assessment:     CCA Substance Use Alcohol/Drug Use: Alcohol / Drug Use Pain Medications: See PTA Prescriptions: See PTA Over the Counter: See PTA History of alcohol / drug use?: Yes Longest period of sobriety (when/how long): Unable to quantify Substance #1 Name of Substance 1: Alcohol 1 - Last Use / Amount: 04/07/2021 "One beer"   ASAM's:  Six Dimensions of Multidimensional Assessment  Dimension 1:  Acute Intoxication and/or Withdrawal Potential:      Dimension 2:  Biomedical Conditions and Complications:      Dimension 3:  Emotional, Behavioral, or Cognitive Conditions and Complications:     Dimension 4:  Readiness to Change:     Dimension 5:  Relapse, Continued use, or Continued Problem Potential:     Dimension 6:  Recovery/Living Environment:     ASAM Severity Score:    ASAM Recommended Level of Treatment:     Substance use Disorder (SUD)    Recommendations for Services/Supports/Treatments:    Discharge Disposition:    DSM5 Diagnoses: Patient Active Problem List   Diagnosis Date Noted   Alcohol abuse 12/21/2020   Severe major depression, single episode, without psychotic features (HCC) 12/21/2020   Reactive depression 09/27/2020   Elevated liver function tests 08/02/2020   Hemi-inattention--left 07/21/2020   Acute ischemic right MCA stroke (HCC) 07/12/2020   Dysphagia, post-stroke    Aspiration pneumonia (HCC)    Seizures (HCC) 07/07/2020   Hyperlipidemia LDL goal <70 07/07/2020   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   Obesity 07/07/2020   Leukocytosis 07/07/2020   History of  CVA with residual deficit    Cerebral infarction due to embolism of R ICA, R MCA vs dissection (HCC) s/p IR w/ revascularization 07/03/2020   Middle cerebral artery embolism, 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   Encephalopathy acute      Referrals to Alternative Service(s): Referred to Alternative Service(s):   Place:   Date:   Time:    Referred to Alternative Service(s):   Place:   Date:   Time:  Referred to Alternative Service(s):   Place:   Date:   Time:    Referred to Alternative Service(s):   Place:   Date:   Time:     Lilyan Gilford MS, LCAS, [facility], [facility] Therapeutic Triage Specialist 04/07/2021 3:32 PM

## 2021-04-07 NOTE — ED Notes (Signed)
Called [facility] for consult [phone]

## 2021-04-07 NOTE — ED Triage Notes (Signed)
Pt in via BPD under IVC for aggressive behavior

## 2021-04-08 DIAGNOSIS — F1994 Other psychoactive substance use, unspecified with psychoactive substance-induced mood disorder: Secondary | ICD-10-CM | POA: Diagnosis not present

## 2021-04-08 DIAGNOSIS — F322 Major depressive disorder, single episode, severe without psychotic features: Secondary | ICD-10-CM | POA: Diagnosis not present

## 2021-04-08 DIAGNOSIS — R45851 Suicidal ideations: Secondary | ICD-10-CM

## 2021-04-08 DIAGNOSIS — F10220 Alcohol dependence with intoxication, uncomplicated: Secondary | ICD-10-CM

## 2021-04-08 NOTE — ED Notes (Signed)
ED Provider at bedside. 

## 2021-04-08 NOTE — ED Notes (Signed)
Pt given dinner tray.

## 2021-04-08 NOTE — ED Notes (Signed)
Pt is pacing the floor and requesting an update every few minutes. Will update vital signs and complete CIWA.

## 2021-04-08 NOTE — ED Notes (Signed)
Amber, RN spoke with pts relative. Girlfriends mobile phone number updated in chart.

## 2021-04-08 NOTE — ED Notes (Signed)
Pt is jogging and pacing throughout the day room

## 2021-04-08 NOTE — ED Notes (Signed)
IVC pending reassessment 

## 2021-04-08 NOTE — ED Notes (Signed)
Pt given breakfast tray and juice at this time. 

## 2021-04-08 NOTE — ED Provider Notes (Signed)
Emergency Medicine Observation Re-evaluation Note  Mark Oneal is a 35 y.o. male, seen on rounds today.  Pt initially presented to the ED for complaints of Aggressive Behavior Currently, the patient is calm, no acute complaints.  Physical Exam  BP 121/74 (BP Location: Right Arm)   Pulse 72   Temp 98.1 F (36.7 C) (Oral)   Resp 19 Comment: Pt tearful  Ht 5\' 3"  (1.6 m)   Wt 76 kg   SpO2 100%   BMI 29.68 kg/m  Physical Exam General: no acute distress Lungs: equal chest rise Psych: calm  ED Course / MDM  EKG:   I have reviewed the labs performed to date as well as medications administered while in observation.  Recent changes in the last 24 hours include none.  Plan  Current plan is for inpatient. Mark Oneal is under involuntary commitment.      [name], MD 04/08/21 [id number]

## 2021-04-08 NOTE — Consult Note (Signed)
[facility] Face-to-Face Psychiatry Consult   Reason for Consult:  Psych eval Referring Physician:  Dr. Larinda Buttery Patient Identification: Mark Oneal MRN:  [id number] Principal Diagnosis: <principal problem not specified> Diagnosis:  Active Problems:   Alcohol abuse   Severe major depression, single episode, without psychotic features (HCC)   Total Time spent with patient: 1 hour  Subjective:   Mark Oneal is a 35 y.o. male patient admitted because he states that he had an argument with his wife and  said "some things.".  HPI:   Mark Oneal, 35 y.o., male patient presented to [facility].  Patient by TTS and this provider; chart reviewed and consulted with Dr. Lucianne Muss on 04/08/21.  On evaluation Mark Oneal reports that he would like to go home and does not understand why he is here. He admits to having a few drinks, but say he would never hurt himself or anyone else. Per chart review, pt was admitted to [facility]  for SI and depression. Pt is vague when answering questions about medication compliance. Per TTS, Mark Oneal is a 35 year old male who presents to the ER via law enforcement, under IVC. Per the patient, his stepson called the police because he was arguing with his wife, stepson's mother. Patient further reports, he believes the son may have thought he was out of control because had drank a beer. When sharing this, the patient wanted to make sure the writer understood he had "only one beer." He states he and the wife was yelling but he did not hit her or anyone else nor did he break anything.   Per the IVC, the stepson reported the patient stated he was going to kill himself. The night before he started acting crazy and this morning he had a knife was threatening the petitioner and in while his younger sister was present. He was hitting walls and he uses alcohol and cocaine. His UDS was negative of any mind-altering substances and his BAC was 159. IVC also says the patient was committed (IVC) approximately  two months ago and was held for a week because he was suicidal but he continues to be suicidal. He is also making threats to the rest of the family that he would stab anyone that tries to take his kids from him.   During the interview, the patient was cooperative and pleasant. He was able to answer questions with appropriate answers. Throughout the interview he denied SI/HI and AV/H. Patient was intoxicated and animate as he answered questions. Upon arrival to the ER, his BAC was 159. He kept asking writer if he could be discharged from the ER, so he can go home and be with his family. "See, look at me. Nothing is wrong. I'm fine. I no hurt myself or anyone. Call my stepson, he'll tell you. I'm fine. I'm not loco."  Recommendations:  Reassess in the am and try talking to collateral as they were not answering the phone during writers shift.    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    Past Surgical History:  Procedure Laterality Date   BUBBLE STUDY  07/06/2020   Procedure: BUBBLE STUDY;  Surgeon: Little Ishikawa, MD;  Location: [facility] ENDOSCOPY;  Service: Cardiovascular;;   IR ANGIO INTRA EXTRACRAN SEL COM CAROTID INNOMINATE UNI L  MOD SED  07/03/2020   IR CT HEAD LTD  07/03/2020   IR CT HEAD LTD  07/03/2020   IR PERCUTANEOUS ART THROMBECTOMY/INFUSION INTRACRANIAL INC DIAG ANGIO  07/03/2020   IR US GUIDE VASC ACCESS RIGHT  07/03/2020   RADIOLOGY WITH ANESTHESIA N/A 07/03/2020   Procedure: IR WITH ANESTHESIA;  Surgeon: Radiologist, Medication, MD;  Location: [facility] OR;  Service: Radiology;  Laterality: N/A;   TEE WITHOUT CARDIOVERSION N/A 07/06/2020   Procedure: TRANSESOPHAGEAL ECHOCARDIOGRAM (TEE);  Surgeon: Little Ishikawa, MD;  Location: [facility] ENDOSCOPY;  Service:  Cardiovascular;  Laterality: N/A;   Family History:  Family History  Problem Relation Age of Onset   Hypertension Mother    Hypertension Father    Family Psychiatric  History: unknown  Social History:  Social History   Substance and Sexual Activity  Alcohol Use Yes   Alcohol/week: 3.0 standard drinks   Types: 3 Cans of beer per week   Comment: 20 ounce cans     Social History   Substance and Sexual Activity  Drug Use Not Currently    Social History   Socioeconomic History   Marital status: Single    Spouse name: Mark Oneal   Number of children: 2   Years of education: Not on file   Highest education level: Not on file  Occupational History   Not on file  Tobacco Use   Smoking status: Former    Types: Cigarettes   Smokeless tobacco: Never   Tobacco comments:    quit after stroke in 2020  Substance and Sexual Activity   Alcohol use: Yes    Alcohol/week: 3.0 standard drinks    Types: 3 Cans of beer per week    Comment: 20 ounce cans   Drug use: Not Currently   Sexual activity: Not on file  Other Topics Concern   Not on file  Social History Narrative   Lives with his partner of 8 years and has 2 kids with her. All of his family is in a different country.   Social Determinants of Health   Financial Resource Strain: Not on file  Food Insecurity: Not on file  Transportation Needs: Not on file  Physical Activity: Not on file  Stress: Not on file  Social Connections: Not on file   Additional Social History:    Allergies:  No Known Allergies  Labs:  Results for orders placed or performed during the hospital encounter of 04/07/21 (from the past 48 hour(s))  Comprehensive metabolic panel     Status: Abnormal   Collection Time: 04/07/21 11:52 AM  Result Value Ref Range   Sodium 138 135 - 145 mmol/L   Potassium 3.8 3.5 - 5.1 mmol/L   Chloride 105 98 - 111 mmol/L   CO2 25 22 - 32 mmol/L   Glucose, Bld 109 (H) 70 - 99 mg/dL    Comment: Glucose reference range  applies only to samples taken after fasting for at least 8 hours.   BUN 13 6 - 20 mg/dL   Creatinine, Ser 6.62 0.61 - 1.24 mg/dL   Calcium 9.2 8.9 - 94.7 mg/dL   Total Protein 7.8 6.5 - 8.1 g/dL   Albumin 4.5 3.5 - 5.0 g/dL   AST 47 (H) 15 - 41 U/L   ALT 33 0 - 44 U/L   Alkaline Phosphatase 126 38 - 126 U/L   Total Bilirubin 0.7 0.3 - 1.2 mg/dL   GFR, Estimated >65 >46 mL/min    Comment: (NOTE) Calculated using the  CKD-EPI Creatinine Equation (2021)    Anion gap 8 5 - 15    Comment: Performed at [facility], [address] [place], [place] [address]  Ethanol     Status: Abnormal   Collection Time: 04/07/21 11:52 AM  Result Value Ref Range   Alcohol, Ethyl (B) 159 (H) <10 mg/dL    Comment: (NOTE) Lowest detectable limit for serum alcohol is 10 mg/dL.  For medical purposes only. Performed at [facility], [address] [place], [place] [address]   Salicylate level     Status: Abnormal   Collection Time: 04/07/21 11:52 AM  Result Value Ref Range   Salicylate Lvl <7.0 (L) 7.0 - 30.0 mg/dL    Comment: Performed at [facility], [address] [place], [place] [address]  Acetaminophen level     Status: Abnormal   Collection Time: 04/07/21 11:52 AM  Result Value Ref Range   Acetaminophen (Tylenol), Serum <10 (L) 10 - 30 ug/mL    Comment: (NOTE) Therapeutic concentrations vary significantly. A range of 10-30 ug/mL  may be an effective concentration for many patients. However, some  are best treated at concentrations outside of this range. Acetaminophen concentrations >150 ug/mL at 4 hours after ingestion  and >50 ug/mL at 12 hours after ingestion are often associated with  toxic reactions.  Performed at [facility], [address] [place], [place] [address]   cbc     Status: Abnormal   Collection Time: 04/07/21 11:52 AM  Result Value Ref Range   WBC 10.9 (H) 4.0 - 10.5 K/uL   RBC 4.62 4.22 - 5.81 MIL/uL   Hemoglobin 15.2 13.0  - 17.0 g/dL   HCT 29.5 62.1 - 30.8 %   MCV 91.3 80.0 - 100.0 fL   MCH 32.9 26.0 - 34.0 pg   MCHC 36.0 30.0 - 36.0 g/dL   RDW 65.7 84.6 - 96.2 %   Platelets 299 150 - 400 K/uL   nRBC 0.0 0.0 - 0.2 %    Comment: Performed at [facility], [address] [place], [place] [address]  Urine Drug Screen, Qualitative     Status: None   Collection Time: 04/07/21 11:52 AM  Result Value Ref Range   Tricyclic, Ur Screen NONE DETECTED NONE DETECTED   Amphetamines, Ur Screen NONE DETECTED NONE DETECTED   MDMA (Ecstasy)Ur Screen NONE DETECTED NONE DETECTED   Cocaine Metabolite,Ur [place] NONE DETECTED NONE DETECTED   Opiate, Ur Screen NONE DETECTED NONE DETECTED   Phencyclidine (PCP) Ur S NONE DETECTED NONE DETECTED   Cannabinoid 50 Ng, Ur [place] NONE DETECTED NONE DETECTED   Barbiturates, Ur Screen NONE DETECTED NONE DETECTED   Benzodiazepine, Ur Scrn NONE DETECTED NONE DETECTED   Methadone Scn, Ur NONE DETECTED NONE DETECTED    Comment: (NOTE) Tricyclics + metabolites, urine    Cutoff 1000 ng/mL Amphetamines + metabolites, urine  Cutoff 1000 ng/mL MDMA (Ecstasy), urine              Cutoff 500 ng/mL Cocaine Metabolite, urine          Cutoff 300 ng/mL Opiate + metabolites, urine        Cutoff 300 ng/mL Phencyclidine (PCP), urine         Cutoff 25 ng/mL Cannabinoid, urine                 Cutoff 50 ng/mL Barbiturates + metabolites, urine  Cutoff 200 ng/mL Benzodiazepine, urine  Cutoff 200 ng/mL Methadone, urine                   Cutoff 300 ng/mL  The urine drug screen provides only a preliminary, unconfirmed analytical test result and should not be used for non-medical purposes. Clinical consideration and professional judgment should be applied to any positive drug screen result due to possible interfering substances. A more specific alternate chemical method must be used in order to obtain a confirmed analytical result. Gas chromatography / mass spectrometry (GC/MS) is the  preferred confirm atory method. Performed at [facility], [address] [place], [place] [address]   Resp Panel by RT-PCR (Flu A&B, Covid) Nasopharyngeal Swab     Status: None   Collection Time: 04/07/21  3:20 PM   Specimen: Nasopharyngeal Swab; Nasopharyngeal(NP) swabs in vial transport medium  Result Value Ref Range   SARS Coronavirus 2 by RT PCR NEGATIVE NEGATIVE    Comment: (NOTE) SARS-CoV-2 target nucleic acids are NOT DETECTED.  The SARS-CoV-2 RNA is generally detectable in upper respiratory specimens during the acute phase of infection. The lowest concentration of SARS-CoV-2 viral copies this assay can detect is 138 copies/mL. A negative result does not preclude SARS-Cov-2 infection and should not be used as the sole basis for treatment or other patient management decisions. A negative result may occur with  improper specimen collection/handling, submission of specimen other than nasopharyngeal swab, presence of viral mutation(s) within the areas targeted by this assay, and inadequate number of viral copies(<138 copies/mL). A negative result must be combined with clinical observations, patient history, and epidemiological information. The expected result is Negative.  Fact Sheet for Patients:  BloggerCourse.comhttps://www.fda.gov/media/152166/download  Fact Sheet for Healthcare Providers:  SeriousBroker.ithttps://www.fda.gov/media/152162/download  This test is no t yet approved or cleared by the [place] FDA and  has been authorized for detection and/or diagnosis of SARS-CoV-2 by FDA under an Emergency Use Authorization (EUA). This EUA will remain  in effect (meaning this test can be used) for the duration of the COVID-19 declaration under Section 564(b)(1) of the Act, 21 U.S.C.section 360bbb-3(b)(1), unless the authorization is terminated  or revoked sooner.       Influenza A by PCR NEGATIVE NEGATIVE   Influenza B by PCR NEGATIVE NEGATIVE    Comment: (NOTE) The Xpert Xpress  SARS-CoV-2/FLU/RSV plus assay is intended as an aid in the diagnosis of influenza from Nasopharyngeal swab specimens and should not be used as a sole basis for treatment. Nasal washings and aspirates are unacceptable for Xpert Xpress SARS-CoV-2/FLU/RSV testing.  Fact Sheet for Patients: BloggerCourse.comhttps://www.fda.gov/media/152166/download  Fact Sheet for Healthcare Providers: SeriousBroker.ithttps://www.fda.gov/media/152162/download  This test is not yet approved or cleared by the [place] FDA and has been authorized for detection and/or diagnosis of SARS-CoV-2 by FDA under an Emergency Use Authorization (EUA). This EUA will remain in effect (meaning this test can be used) for the duration of the COVID-19 declaration under Section 564(b)(1) of the Act, 21 U.S.C. section 360bbb-3(b)(1), unless the authorization is terminated or revoked.  Performed at [facility], [address] [place], [place] [address]     Current Facility-Administered Medications  Medication Dose Route Frequency Provider Last Rate Last Admin   atorvastatin (LIPITOR) tablet 80 mg  80 mg Oral Daily Shaune PollackIsaacs, Cameron, MD       citalopram (CELEXA) tablet 20 mg  20 mg Oral QHS Shaune PollackIsaacs, Cameron, MD   20 mg at 04/07/21 2213   clopidogrel (PLAVIX) tablet 75 mg  75 mg Oral Daily Isaacs,  Sheria Lang, MD   75 mg at 04/07/21 1726   docusate sodium (COLACE) capsule 100 mg  100 mg Oral Daily Shaune Pollack, MD   100 mg at 04/07/21 1726   levETIRAcetam (KEPPRA) tablet 500 mg  500 mg Oral BID Shaune Pollack, MD   500 mg at 04/07/21 2213   LORazepam (ATIVAN) injection 0-4 mg  0-4 mg Intravenous Q6H Shaune Pollack, MD       Or   LORazepam (ATIVAN) tablet 0-4 mg  0-4 mg Oral Q6H Shaune Pollack, MD       Melene Muller ON 04/09/2021] LORazepam (ATIVAN) injection 0-4 mg  0-4 mg Intravenous Milagros Reap, MD       Or   Melene Muller ON 04/09/2021] LORazepam (ATIVAN) tablet 0-4 mg  0-4 mg Oral Q12H Shaune Pollack, MD       naltrexone (DEPADE) tablet 50  mg  50 mg Oral Daily Shaune Pollack, MD       pantoprazole (PROTONIX) EC tablet 40 mg  40 mg Oral Daily Shaune Pollack, MD   40 mg at 04/07/21 1726   senna-docusate (Senokot-S) tablet 1 tablet  1 tablet Oral BID Shaune Pollack, MD   1 tablet at 04/07/21 2213   thiamine tablet 100 mg  100 mg Oral Daily Shaune Pollack, MD   100 mg at 04/07/21 1726   Or   thiamine (B-1) injection 100 mg  100 mg Intravenous Daily Shaune Pollack, MD       traZODone (DESYREL) tablet 50 mg  50 mg Oral QHS PRN Shaune Pollack, MD   50 mg at 04/07/21 2213   Current Outpatient Medications  Medication Sig Dispense Refill   atorvastatin (LIPITOR) 80 MG tablet Take 1 tablet (80 mg total) by mouth daily. 30 tablet 1   citalopram (CELEXA) 20 MG tablet Take 1 tablet (20 mg total) by mouth at bedtime. 30 tablet 1   clopidogrel (PLAVIX) 75 MG tablet Take 1 tablet (75 mg total) by mouth daily. 30 tablet 1   docusate sodium (COLACE) 100 MG capsule TAKE 1 CAPSULE (100 MG TOTAL) BY MOUTH 2 (TWO) TIMES DAILY. 60 capsule 0   levETIRAcetam (KEPPRA) 500 MG tablet Take 1 tablet (500 mg total) by mouth 2 (two) times daily. 60 tablet 1   Multiple Vitamin (MULTIVITAMIN WITH MINERALS) TABS tablet Take 1 tablet by mouth daily. 30 tablet 0   naltrexone (DEPADE) 50 MG tablet Take 1 tablet (50 mg total) by mouth daily. 30 tablet 1   pantoprazole (PROTONIX) 40 MG tablet Take 1 tablet (40 mg total) by mouth daily. 30 tablet 1   senna-docusate (SENOKOT-S) 8.6-50 MG tablet Take 1 tablet by mouth 2 (two) times daily.     traZODone (DESYREL) 50 MG tablet Take 1 tablet (50 mg total) by mouth at bedtime as needed for sleep. 30 tablet 1    Musculoskeletal: Strength & Muscle Tone: within normal limits Gait & Station: normal Patient leans: N/A            Psychiatric Specialty Exam:  Presentation  General Appearance: Disheveled  Eye Contact:Minimal  Speech:Normal Rate  Speech Volume:Normal  Handedness:Right   Mood and  Affect  Mood:Anxious  Affect:Congruent   Thought Process  Thought Processes:Goal Directed  Descriptions of Associations:Intact  Orientation:Full (Time, Place and Person)  Thought Content:Perseveration  History of Schizophrenia/Schizoaffective disorder:No  Duration of Psychotic Symptoms:No data recorded Hallucinations:No data recorded Ideas of Reference:None  Suicidal Thoughts:No data recorded Homicidal Thoughts:No data recorded  Sensorium  Memory:Immediate Fair; Recent Fair; Remote Fair  Judgment:Poor  Insight:Lacking   Executive Functions  Concentration:Poor  Attention Span:Poor  Recall:Poor  Fund of Knowledge:Fair  Language:Fair   Psychomotor Activity  Psychomotor Activity: No data recorded  Assets  Assets:Desire for Improvement; Social Support; Resilience; Housing; Talents/Skills   Sleep  Sleep: No data recorded  Physical Exam: Physical Exam Vitals and nursing note reviewed.  HENT:     Head: Normocephalic and atraumatic.     Mouth/Throat:     Mouth: Mucous membranes are moist.  Eyes:     Pupils: Pupils are equal, round, and reactive to light.  Pulmonary:     Effort: Pulmonary effort is normal.  Musculoskeletal:        General: Normal range of motion.     Cervical back: Normal range of motion and neck supple.  Skin:    General: Skin is warm and dry.  Neurological:     General: No focal deficit present.     Mental Status: He is alert and oriented to person, place, and time.  Psychiatric:        Attention and Perception: Attention normal.        Mood and Affect: Mood normal.        Speech: Speech normal.        Behavior: Behavior normal. Behavior is cooperative.        Thought Content: Thought content normal.        Cognition and Memory: Cognition and memory normal.        Judgment: Judgment is impulsive.   ROS Blood pressure 112/81, pulse 62, temperature 98.1 F (36.7 C), temperature source Oral, resp. rate 18, height  (1.6  m), weight 76 kg, SpO2 100 %. Body mass index is 29.68 kg/m.  Treatment Plan Summary: Reassess in the am    Jearld Lesch, NP 04/08/2021 5:50 AM

## 2021-04-09 ENCOUNTER — Ambulatory Visit: Payer: Self-pay | Admitting: Adult Health

## 2021-04-09 DIAGNOSIS — F1994 Other psychoactive substance use, unspecified with psychoactive substance-induced mood disorder: Secondary | ICD-10-CM

## 2021-04-09 NOTE — Discharge Instructions (Addendum)

## 2021-04-09 NOTE — Progress Notes (Deleted)
[facility] [address] [place]. [name] [address] [phone]       HOSPITAL FOLLOW UP NOTE  [name] Date of Birth:  1986/08/23 Medical Record Number:  [id number]   Reason for Referral:  hospital stroke follow up    SUBJECTIVE:   CHIEF COMPLAINT:  No chief complaint on file.   HPI:   Mr. [name] is a 35 y.o. 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 Found to have L sided weakness w/ neglect and R gaze on arrival to ED.  Personally reviewed hospitalization pertinent progress notes, lab work and imaging with summary provided.  Stroke work-up revealed right MCA infarct due to right ICA and MCA occlusion s/p tPA and IR with TICI three revascularization, embolic secondary to unknown source vs possible right ICA dissection.  LE Doppler negative.  TEE no evidence of cardiac source of embolus or PFO.  Recommended DAPT for 3 weeks and Plavix alone as previously on aspirin.  Evidence of seizure and stroke onset well with Keppra and initiated Keppra 500 mg twice daily for seizure prophylaxis.  EEG generalized slowing suggestive of diffuse encephalopathy but no evidence of seizures or epileptiform discharges.  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  Direct LDL 151 with elevated triglycerides (UTC LDL) and initiate atorvastatin 80 mg daily.  No prior history of HTN or DM.  Hospital course complicated by leukocytosis with low-grade fevers and tachypnea likely due to aspiration pneumonia treated with antibiotics.  Residual deficits of mild dysarthria, left facial droop, left hemiparesis and dysphagia.  Evaluated by therapies and discharged to CIR for ongoing therapy needs on 07/12/2020.  Stroke:   R MCA infarct due to  right ICA and MCA occlusion s/p tPA and IR w/ TICI3 revascularization, embolic, secondary to unknown source vs possible dissection   Code Stroke CT head No acute hemorrhage. Suspect R MCA thrombus w/ acute infarct. ASPECTS 8.   CTA head & neck cervical R ICA occlusion 2cm above origin w/ reconstitution and subsequent occlusion proximal R M1 extending into bifurcation  Cerebral angio / IR extracranial R ICA/terminus, R MCA occlusion w/ TICI3 revascularization. Possible R ICA dissection Repeat CT head dense contrast staining posterior R lentiform (no hemorrhage). New/increased edema R basal ganglia, R insula, anterior temporal lobe w/ mild mass effect R lateral ventricle. 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 MRI right MCA multifocal infarcts LE Doppler no DVT EEG - 10/19 - Continuous slow, generalized suggestive of severe diffuse encephalopathy, nonspecific etiology. No seizures or epileptiform discharges were seen throughout the recording. 2D Echo EF 60-65% TEE No SOE, neg bubble TG 823, direct LDL 151  HgbA1c - 5.6 UDS - negative Hypercoagulable labs only ACL IgM 16 slightly elevated o/w negative VTE prophylaxis - Lovenox 40 mg sq daily  aspirin 81 mg daily prior to admission, now on aspirin 81 and the Plavix 75 DAPT for 3 weeks and then Plavix alone. Therapy recommendations:  CIR  Disposition:  CIR   Today, 10/17/2020, [name] is being seen for hospital follow-up assisted by interpreter.  He was discharged home from CIR on 07/22/2020 after a 10 day stay.  Doing well since discharge without new stroke/TIA symptoms and he has recovered well functionally since discharge but continues to struggle with anxiety and  concerns of having additional strokes.  This has been slowly improving especially since starting Celexa 10 mg nightly.  He is currently working at [employer] but is wondering if he would be able to return back to his prior job in [employer].  He has completed 3 weeks DAPT and remains on  Plavix alone without bleeding or bruising.  Remains on atorvastatin 80 mg daily without myalgias.  Blood pressure today 126/94. Reports ongoing use of Keppra 500 mg twice daily tolerating well without any recurrent seizures.  He questions ongoing need of Keppra and possible return to driving.  He continues to refrain from tobacco and cocaine use.  He does admit to occasional alcohol use.  No further concerns at this time.     ROS:   14 system review of systems performed and negative with exception of anxiety  PMH:  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    PSH:  Past Surgical History:  Procedure Laterality Date   BUBBLE STUDY  07/06/2020   Procedure: BUBBLE STUDY;  Surgeon: Little Ishikawa, MD;  Location: [facility] ENDOSCOPY;  Service: Cardiovascular;;   IR ANGIO INTRA EXTRACRAN SEL COM CAROTID INNOMINATE UNI L MOD SED  07/03/2020   IR CT HEAD LTD  07/03/2020   IR CT HEAD LTD  07/03/2020   IR PERCUTANEOUS ART THROMBECTOMY/INFUSION INTRACRANIAL INC DIAG ANGIO  07/03/2020   IR US GUIDE VASC ACCESS RIGHT  07/03/2020   RADIOLOGY WITH ANESTHESIA N/A 07/03/2020   Procedure: IR WITH ANESTHESIA;  Surgeon: Radiologist, Medication, MD;  Location: [facility] OR;  Service: Radiology;  Laterality: N/A;   TEE WITHOUT CARDIOVERSION N/A 07/06/2020   Procedure: TRANSESOPHAGEAL ECHOCARDIOGRAM (TEE);  Surgeon: Little Ishikawa, MD;  Location: [facility] ENDOSCOPY;  Service: Cardiovascular;  Laterality: N/A;    Social History:  Social History   Socioeconomic History   Marital status: Single    Spouse name: [name]   Number of children: 2   Years of education: Not on file   Highest education level: Not on file  Occupational History   Not on file  Tobacco Use   Smoking status: Former    Types: Cigarettes   Smokeless tobacco: Never   Tobacco comments:    quit after stroke in 2020  Substance and Sexual Activity   Alcohol use: Yes    Alcohol/week: 3.0  standard drinks    Types: 3 Cans of beer per week    Comment: 20 ounce cans   Drug use: Not Currently   Sexual activity: Not on file  Other Topics Concern   Not on file  Social History Narrative   Lives with his partner of 8 years and has 2 kids with her. All of his family is in a different country.   Social Determinants of Health   Financial Resource Strain: Not on file  Food Insecurity: Not on file  Transportation Needs: Not on file  Physical Activity: Not on file  Stress: Not on file  Social Connections: Not on file  Intimate Partner Violence: Not on file    Family History:  Family History  Problem Relation Age of Onset   Hypertension Mother    Hypertension Father     Medications:   Current Facility-Administered Medications on File Prior to Visit  Medication Dose Route Frequency Provider Last Rate Last Admin   atorvastatin (LIPITOR) tablet 80 mg  80 mg Oral Daily Shaune Pollack, MD   80 mg at 04/08/21 0942   citalopram (  CELEXA) tablet 20 mg  20 mg Oral QHS Shaune Pollack, MD   20 mg at 04/08/21 2123   clopidogrel (PLAVIX) tablet 75 mg  75 mg Oral Daily Shaune Pollack, MD   75 mg at 04/08/21 0737   docusate sodium (COLACE) capsule 100 mg  100 mg Oral Daily Shaune Pollack, MD   100 mg at 04/08/21 1062   levETIRAcetam (KEPPRA) tablet 500 mg  500 mg Oral BID Shaune Pollack, MD   500 mg at 04/08/21 2119   LORazepam (ATIVAN) injection 0-4 mg  0-4 mg Intravenous Q6H Shaune Pollack, MD       Or   LORazepam (ATIVAN) tablet 0-4 mg  0-4 mg Oral Q6H Shaune Pollack, MD   1 mg at 04/08/21 1340   LORazepam (ATIVAN) injection 0-4 mg  0-4 mg Intravenous Q12H Shaune Pollack, MD       Or   LORazepam (ATIVAN) tablet 0-4 mg  0-4 mg Oral Q12H Shaune Pollack, MD       naltrexone (DEPADE) tablet 50 mg  50 mg Oral Daily Shaune Pollack, MD   50 mg at 04/08/21 1341   pantoprazole (PROTONIX) EC tablet 40 mg  40 mg Oral Daily Shaune Pollack, MD   40 mg at 04/08/21 6948   senna-docusate  (Senokot-S) tablet 1 tablet  1 tablet Oral BID Shaune Pollack, MD   1 tablet at 04/08/21 2119   thiamine tablet 100 mg  100 mg Oral Daily Shaune Pollack, MD   100 mg at 04/08/21 5462   Or   thiamine (B-1) injection 100 mg  100 mg Intravenous Daily Shaune Pollack, MD       traZODone (DESYREL) tablet 50 mg  50 mg Oral QHS PRN Shaune Pollack, MD   50 mg at 04/08/21 2119   Current Outpatient Medications on File Prior to Visit  Medication Sig Dispense Refill   atorvastatin (LIPITOR) 80 MG tablet Take 1 tablet (80 mg total) by mouth daily. 30 tablet 1   citalopram (CELEXA) 20 MG tablet Take 1 tablet (20 mg total) by mouth at bedtime. 30 tablet 1   clopidogrel (PLAVIX) 75 MG tablet Take 1 tablet (75 mg total) by mouth daily. 30 tablet 1   docusate sodium (COLACE) 100 MG capsule TAKE 1 CAPSULE (100 MG TOTAL) BY MOUTH 2 (TWO) TIMES DAILY. 60 capsule 0   levETIRAcetam (KEPPRA) 500 MG tablet Take 1 tablet (500 mg total) by mouth 2 (two) times daily. 60 tablet 1   Multiple Vitamin (MULTIVITAMIN WITH MINERALS) TABS tablet Take 1 tablet by mouth daily. 30 tablet 0   naltrexone (DEPADE) 50 MG tablet Take 1 tablet (50 mg total) by mouth daily. 30 tablet 1   pantoprazole (PROTONIX) 40 MG tablet Take 1 tablet (40 mg total) by mouth daily. 30 tablet 1   senna-docusate (SENOKOT-S) 8.6-50 MG tablet Take 1 tablet by mouth 2 (two) times daily.     traZODone (DESYREL) 50 MG tablet Take 1 tablet (50 mg total) by mouth at bedtime as needed for sleep. 30 tablet 1    Allergies:  No Known Allergies    OBJECTIVE:  Physical Exam  There were no vitals filed for this visit.  There is no height or weight on file to calculate BMI. No results found.  General: well developed, well nourished,  pleasant middle-age Hispanic male, seated, mildly anxious and occasionally tearful throughout visit Head: head normocephalic and atraumatic.   Neck: supple with no carotid or supraclavicular bruits Cardiovascular: regular  rate and rhythm,  no murmurs Musculoskeletal: no deformity Skin:  no rash/petichiae Vascular:  Normal pulses all extremities   Neurologic Exam Mental Status: Awake and fully alert.   Primarily Spanish-speaking but denies dysarthria or aphasia.  Oriented to place and time. Recent and remote memory intact. Attention span, concentration and fund of knowledge appropriate. Mood and affect appropriate for situation.  Cranial Nerves: Fundoscopic exam reveals sharp disc margins. Pupils equal, briskly reactive to light. Extraocular movements full without nystagmus. Visual fields full to confrontation. Hearing intact. Facial sensation intact. Face, tongue, palate moves normally and symmetrically.  Motor: Normal bulk and tone. Normal strength in all tested extremity muscles except slightly decreased left dexterity  Sensory.: intact to touch , pinprick , position and vibratory sensation.  Coordination: Rapid alternating movements normal in all extremities except slightly decreased left hand dexterity. Finger-to-nose and heel-to-shin performed accurately bilaterally.  Mildly orbits right arm over left arm. Gait and Station: Arises from chair without difficulty. Stance is normal. Gait demonstrates normal stride length and balance without use of assistive device.  Able to tandem walk and heel toe without difficulty. Reflexes: 1+ and symmetric. Toes downgoing.     NIHSS  0 Modified Rankin  1      ASSESSMENT: Mark Oneal is a 35 y.o. year old male presented with slurred speech and numbness followed by collapse and possible seizure on 07/03/2020 with evidence of left-sided weakness w/ neglect and right gaze preference upon ED arrival with stroke work-up revealing right MCA infarct due to right ICA and MCA occlusion s/p tPA and IR with TICI three revascularization, embolic secondary to unknown source vs possible right ICA dissection. Vascular risk factors include hx of cerebellar stroke 05/2019, hx of tobacco,  EtOH and cocaine use (possible etiology of prior stroke), HLD and obesity.      PLAN:  R MCA stroke :  Residual deficit: Slight decrease left hand dexterity and anxiety.  Encourage slowly returning back to prior activities as this will likely greatly help his anxiety.  Continue Celexa 10 mg daily.  Discussed possible benefit of counseling but he declines at this time.  In regards to return back to prior work in [employer], he was advised to hold off until repeat CTA completed due to possible dissection Continue clopidogrel 75 mg daily  and atorvastatin 80 mg daily for secondary stroke prevention.   Discussed secondary stroke prevention measures and importance of close PCP follow up for aggressive stroke risk factor management  Seizures with stroke onset: No additional seizure events.  Repeat EEG.  Continue Keppra 500 mg twice daily for seizure prophylaxis.  Discussed [place] law which is no driving for at least 6 months post seizure activity ?R ICA dissection: Questionable dissection per cerebral angiogram.  Repeat CTA head to further assess for resolution HLD: LDL goal <70. Recent direct LDL 151 and initiated atorvastatin 80 mg daily during recent stroke admission.  He does not currently have PCP -repeat lipid panel today Referral will be placed to establish care with PCP    Follow up in 4 months or call earlier if needed   CC:  [facility] provider: Dr. Pearlean Brownie   I spent 50 minutes of face-to-face and non-face-to-face time with patient assisted by interpreter.  This included previsit chart review including recent hospitalization pertinent progress notes, lab work and imaging, lab review, study review, order entry, electronic health record documentation, patient education regarding recent stroke and possible etiology, residual deficits, importance of managing stroke risk factors and answered all other multiple questions to patient  satisfaction  Ihor AustinJessica McCue, AGNP-BC  [facility] [address] [place], [place] [id number]  Phone [phone] Fax [phone] Note: This document was prepared with digital dictation and possible smart phrase technology. Any transcriptional errors that result from this process are unintentional.

## 2021-04-09 NOTE — ED Notes (Signed)
Rescinded IVC by Dr. Clapacs 

## 2021-04-09 NOTE — ED Provider Notes (Addendum)
Emergency Medicine Observation Re-evaluation Note  Mark Oneal is a 35 y.o. male, seen on rounds today.  Pt initially presented to the ED for complaints of Aggressive Behavior Currently, the patient is calm, no acute complaints.  Physical Exam  BP 116/79   Pulse 62   Temp 97.9 F (36.6 C)   Resp 18   Ht 5\' 3"  (1.6 m)   Wt 76 kg   SpO2 99%   BMI 29.68 kg/m  Physical Exam General: no acute distress, currently walking into the room conversing lungs: equal chest rise Psych: calm  ED Course / MDM  EKG:   I have reviewed the labs performed to date as well as medications administered while in observation.  Recent changes in the last 24 hours include none.  Plan  Current plan is for inpatient. Mark Oneal is under involuntary commitment.         [name], MD 04/09/21 1040  ----------------------------------------- 2:16 PM on 04/09/2021 ----------------------------------------- Per Dr. 04/11/2021 "spoke with this man's wife and she says he never said he was suicidal and she feels comfortable with him coming home. I have discontinued the IVC. He will begiven referral to [facility]"  Patient has been rescinded from IVC.  Patient appropriate for discharge, seen and cleared for discharge with recommendation to follow-up with [facility] provided.  Vitals:   04/08/21 1349 04/09/21 1023  BP: 121/74 116/79  Pulse: 72 62  Resp: 19 18  Temp:  97.9 F (36.6 C)  SpO2: 100% 99%      04/11/21, MD 04/09/21 1417

## 2021-04-09 NOTE — ED Notes (Signed)
IVC/pending AM reassesment

## 2021-04-09 NOTE — ED Notes (Signed)
Pt's stepson called to assist in talking with his mother.  Left number of [phone] to return call.  Dr. Toni Amend given same.

## 2021-04-09 NOTE — Consult Note (Signed)
[facility] Face-to-Face Psychiatry Consult   Reason for Consult: Consult for 35 year old man brought in under commitment papers filed by his stepson that alleged that he made suicidal statements Referring Physician: Quale Patient Identification: Mark Oneal MRN:  [id number] Principal Diagnosis: Substance induced mood disorder (HCC) Diagnosis:  Principal Problem:   Substance induced mood disorder (HCC) Active Problems:   Alcohol abuse   Total Time spent with patient: 1 hour  Subjective:   Mark Oneal is a 35 y.o. male patient admitted with "I need to go home now".  HPI: Patient seen chart reviewed.  Patient was brought here under involuntary commitment papers filed by his stepson which alleged that the patient had made repeated threats to kill himself while drinking heavily.  Patient had a blood alcohol level of 159 when he came in.  Drug screen unremarkable.  Patient has consistently denied any suicidal ideation since being in the emergency room.  On interview today which was done without Spanish language interpreter at his preference he stated that his mood had recently been fine.  He said all that happened was he was mowing the grass on Saturday and had a beer to drink.  He later clarified that he had had 5 beers that day compared to his normal 2.  He absolutely denied having any suicidal thought or making any suicidal statements.  Denied any symptoms of depression.  I spoke with his wife using a Spanish language interpreter provided by the hospital.  She also stated that he had made no suicidal threats or statements and that she felt it was safe for him to come home.  She described her opinion of his alcohol problem as being "not a big deal"  Past Psychiatric History: Patient has been seen in the emergency room at least once before for identical situation.  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    Past Surgical History:  Procedure Laterality Date   BUBBLE STUDY  07/06/2020   Procedure: BUBBLE STUDY;  Surgeon: Little Ishikawa, MD;  Location: [facility] ENDOSCOPY;  Service: Cardiovascular;;   IR ANGIO INTRA EXTRACRAN SEL COM CAROTID INNOMINATE UNI L MOD SED  07/03/2020   IR CT HEAD LTD  07/03/2020   IR CT HEAD LTD  07/03/2020   IR PERCUTANEOUS ART THROMBECTOMY/INFUSION INTRACRANIAL INC DIAG ANGIO  07/03/2020   IR US GUIDE VASC ACCESS RIGHT  07/03/2020   RADIOLOGY WITH ANESTHESIA N/A 07/03/2020   Procedure: IR WITH ANESTHESIA;  Surgeon: Radiologist, Medication, MD;  Location: [facility] OR;  Service: Radiology;  Laterality: N/A;   TEE WITHOUT CARDIOVERSION N/A 07/06/2020   Procedure: TRANSESOPHAGEAL ECHOCARDIOGRAM (TEE);  Surgeon: Little Ishikawa, MD;  Location: [facility] ENDOSCOPY;  Service: Cardiovascular;  Laterality: N/A;   Family History:  Family History  Problem Relation Age of Onset   Hypertension Mother    Hypertension Father    Family Psychiatric  History: None reported Social History:  Social History   Substance and Sexual Activity  Alcohol Use Yes   Alcohol/week: 3.0 standard drinks   Types: 3 Cans of beer per week   Comment: 20 ounce cans     Social History   Substance and Sexual Activity  Drug Use Not Currently  Social History   Socioeconomic History   Marital status: Single    Spouse name: [name]   Number of children: 2   Years of education: Not on file   Highest education level: Not on file  Occupational History   Not on file  Tobacco Use   Smoking status: Former    Types: Cigarettes   Smokeless tobacco: Never   Tobacco comments:    quit after stroke in 2020  Substance and Sexual Activity   Alcohol use: Yes    Alcohol/week: 3.0 standard drinks    Types: 3 Cans of  beer per week    Comment: 20 ounce cans   Drug use: Not Currently   Sexual activity: Not on file  Other Topics Concern   Not on file  Social History Narrative   Lives with his partner of 8 years and has 2 kids with her. All of his family is in a different country.   Social Determinants of Health   Financial Resource Strain: Not on file  Food Insecurity: Not on file  Transportation Needs: Not on file  Physical Activity: Not on file  Stress: Not on file  Social Connections: Not on file   Additional Social History:    Allergies:  No Known Allergies  Labs:  Results for orders placed or performed during the hospital encounter of 04/07/21 (from the past 48 hour(s))  Resp Panel by RT-PCR (Flu A&B, Covid) Nasopharyngeal Swab     Status: None   Collection Time: 04/07/21  3:20 PM   Specimen: Nasopharyngeal Swab; Nasopharyngeal(NP) swabs in vial transport medium  Result Value Ref Range   SARS Coronavirus 2 by RT PCR NEGATIVE NEGATIVE    Comment: (NOTE) SARS-CoV-2 target nucleic acids are NOT DETECTED.  The SARS-CoV-2 RNA is generally detectable in upper respiratory specimens during the acute phase of infection. The lowest concentration of SARS-CoV-2 viral copies this assay can detect is 138 copies/mL. A negative result does not preclude SARS-Cov-2 infection and should not be used as the sole basis for treatment or other patient management decisions. A negative result may occur with  improper specimen collection/handling, submission of specimen other than nasopharyngeal swab, presence of viral mutation(s) within the areas targeted by this assay, and inadequate number of viral copies(<138 copies/mL). A negative result must be combined with clinical observations, patient history, and epidemiological information. The expected result is Negative.  Fact Sheet for Patients:  BloggerCourse.com  Fact Sheet for Healthcare Providers:   SeriousBroker.it  This test is no t yet approved or cleared by the [place] FDA and  has been authorized for detection and/or diagnosis of SARS-CoV-2 by FDA under an Emergency Use Authorization (EUA). This EUA will remain  in effect (meaning this test can be used) for the duration of the COVID-19 declaration under Section 564(b)(1) of the Act, 21 U.S.C.section 360bbb-3(b)(1), unless the authorization is terminated  or revoked sooner.       Influenza A by PCR NEGATIVE NEGATIVE   Influenza B by PCR NEGATIVE NEGATIVE    Comment: (NOTE) The Xpert Xpress SARS-CoV-2/FLU/RSV plus assay is intended as an aid in the diagnosis of influenza from Nasopharyngeal swab specimens and should not be used as a sole basis for treatment. Nasal washings and aspirates are unacceptable for Xpert Xpress SARS-CoV-2/FLU/RSV testing.  Fact Sheet for Patients: BloggerCourse.com  Fact Sheet for Healthcare Providers: SeriousBroker.it  This test is not yet approved or cleared by the [place] FDA and has been authorized for detection and/or diagnosis of  SARS-CoV-2 by FDA under an Emergency Use Authorization (EUA). This EUA will remain in effect (meaning this test can be used) for the duration of the COVID-19 declaration under Section 564(b)(1) of the Act, 21 U.S.C. section 360bbb-3(b)(1), unless the authorization is terminated or revoked.  Performed at [facility], [address] [place], [place] [address]     Current Facility-Administered Medications  Medication Dose Route Frequency Provider Last Rate Last Admin   atorvastatin (LIPITOR) tablet 80 mg  80 mg Oral Daily Shaune Pollack, MD   80 mg at 04/09/21 0941   citalopram (CELEXA) tablet 20 mg  20 mg Oral Dorette Grate, MD   20 mg at 04/08/21 2123   clopidogrel (PLAVIX) tablet 75 mg  75 mg Oral Daily Shaune Pollack, MD   75 mg at 04/09/21 0941    docusate sodium (COLACE) capsule 100 mg  100 mg Oral Daily Shaune Pollack, MD   100 mg at 04/09/21 0941   levETIRAcetam (KEPPRA) tablet 500 mg  500 mg Oral BID Shaune Pollack, MD   500 mg at 04/09/21 0941   LORazepam (ATIVAN) injection 0-4 mg  0-4 mg Intravenous Milagros Reap, MD       Or   LORazepam (ATIVAN) tablet 0-4 mg  0-4 mg Oral Q12H Shaune Pollack, MD       naltrexone (DEPADE) tablet 50 mg  50 mg Oral Daily Shaune Pollack, MD   50 mg at 04/09/21 0941   pantoprazole (PROTONIX) EC tablet 40 mg  40 mg Oral Daily Shaune Pollack, MD   40 mg at 04/09/21 0941   senna-docusate (Senokot-S) tablet 1 tablet  1 tablet Oral BID Shaune Pollack, MD   1 tablet at 04/09/21 0941   thiamine tablet 100 mg  100 mg Oral Daily Shaune Pollack, MD   100 mg at 04/09/21 6759   Or   thiamine (B-1) injection 100 mg  100 mg Intravenous Daily Shaune Pollack, MD       traZODone (DESYREL) tablet 50 mg  50 mg Oral QHS PRN Shaune Pollack, MD   50 mg at 04/08/21 2119   Current Outpatient Medications  Medication Sig Dispense Refill   atorvastatin (LIPITOR) 80 MG tablet Take 1 tablet (80 mg total) by mouth daily. 30 tablet 1   citalopram (CELEXA) 20 MG tablet Take 1 tablet (20 mg total) by mouth at bedtime. 30 tablet 1   clopidogrel (PLAVIX) 75 MG tablet Take 1 tablet (75 mg total) by mouth daily. 30 tablet 1   docusate sodium (COLACE) 100 MG capsule TAKE 1 CAPSULE (100 MG TOTAL) BY MOUTH 2 (TWO) TIMES DAILY. 60 capsule 0   levETIRAcetam (KEPPRA) 500 MG tablet Take 1 tablet (500 mg total) by mouth 2 (two) times daily. 60 tablet 1   Multiple Vitamin (MULTIVITAMIN WITH MINERALS) TABS tablet Take 1 tablet by mouth daily. 30 tablet 0   naltrexone (DEPADE) 50 MG tablet Take 1 tablet (50 mg total) by mouth daily. 30 tablet 1   pantoprazole (PROTONIX) 40 MG tablet Take 1 tablet (40 mg total) by mouth daily. 30 tablet 1   senna-docusate (SENOKOT-S) 8.6-50 MG tablet Take 1 tablet by mouth 2 (two) times daily.      traZODone (DESYREL) 50 MG tablet Take 1 tablet (50 mg total) by mouth at bedtime as needed for sleep. 30 tablet 1    Musculoskeletal: Strength & Muscle Tone: within normal limits Gait & Station: normal Patient leans: N/A  Psychiatric Specialty Exam:  Presentation  General Appearance: Disheveled  Eye Contact:Minimal  Speech:Normal Rate  Speech Volume:Normal  Handedness:Right   Mood and Affect  Mood:Anxious  Affect:Congruent   Thought Process  Thought Processes:Goal Directed  Descriptions of Associations:Intact  Orientation:Full (Time, Place and Person)  Thought Content:Perseveration  History of Schizophrenia/Schizoaffective disorder:No  Duration of Psychotic Symptoms:No data recorded Hallucinations:No data recorded Ideas of Reference:None  Suicidal Thoughts:No data recorded Homicidal Thoughts:No data recorded  Sensorium  Memory:Immediate Fair; Recent Fair; Remote Fair  Judgment:Poor  Insight:Lacking   Executive Functions  Concentration:Poor  Attention Span:Poor  Recall:Poor  Fund of Knowledge:Fair  Language:Fair   Psychomotor Activity  Psychomotor Activity: No data recorded  Assets  Assets:Desire for Improvement; Social Support; Resilience; Housing; Talents/Skills   Sleep  Sleep: No data recorded  Physical Exam: Physical Exam Vitals and nursing note reviewed.  Constitutional:      Appearance: Normal appearance.  HENT:     Head: Normocephalic and atraumatic.     Mouth/Throat:     Pharynx: Oropharynx is clear.  Eyes:     Pupils: Pupils are equal, round, and reactive to light.  Cardiovascular:     Rate and Rhythm: Normal rate and regular rhythm.  Pulmonary:     Effort: Pulmonary effort is normal.     Breath sounds: Normal breath sounds.  Abdominal:     General: Abdomen is flat.     Palpations: Abdomen is soft.  Musculoskeletal:        General: Normal range of motion.  Skin:    General: Skin is warm and  dry.  Neurological:     General: No focal deficit present.     Mental Status: He is alert. Mental status is at baseline.  Psychiatric:        Attention and Perception: Attention normal.        Mood and Affect: Mood is anxious.        Speech: Speech normal.        Behavior: Behavior is agitated. Behavior is not aggressive.        Thought Content: Thought content normal.        Cognition and Memory: Cognition normal.        Judgment: Judgment is impulsive.   Review of Systems  Constitutional: Negative.   HENT: Negative.    Eyes: Negative.   Respiratory: Negative.    Cardiovascular: Negative.   Gastrointestinal: Negative.   Musculoskeletal: Negative.   Skin: Negative.   Neurological: Negative.   Psychiatric/Behavioral:  Positive for memory loss and substance abuse. Negative for depression, hallucinations and suicidal ideas. The patient is not nervous/anxious and does not have insomnia.   Blood pressure 116/79, pulse 62, temperature 97.9 F (36.6 C), resp. rate 18, height  (1.6 m), weight 76 kg, SpO2 99 %. Body mass index is 29.68 kg/m.  Treatment Plan Summary: Plan after speaking with his wife it appears there are no grounds for the commitment.  No evidence of any active suicidal threat.  Patient is upbeat and denying any depression.  Case reviewed with emergency room physician.  Discontinue IVC paperwork.  Patient did meet with the representative from [facility] and will be referred there for substance abuse treatment and encouraged to stop drinking.  Disposition: No evidence of imminent risk to self or others at present.   Patient does not meet criteria for psychiatric inpatient admission. Supportive therapy provided about ongoing stressors. Discussed crisis plan, support from social network, calling 911, coming to the Emergency Department, and  calling Suicide Hotline.  Mordecai Rasmussen, MD 04/09/2021 2:25 PM

## 2021-05-05 ENCOUNTER — Other Ambulatory Visit: Payer: Self-pay

## 2021-05-05 ENCOUNTER — Inpatient Hospital Stay
Admission: EM | Admit: 2021-05-05 | Discharge: 2021-05-07 | DRG: 433 | Disposition: A | Discharge status: Home / Self Care | Payer: Self-pay | Attending: Internal Medicine | Admitting: Internal Medicine

## 2021-05-05 DIAGNOSIS — Z7902 Long term (current) use of antithrombotics/antiplatelets: Secondary | ICD-10-CM

## 2021-05-05 DIAGNOSIS — R17 Unspecified jaundice: Secondary | ICD-10-CM

## 2021-05-05 DIAGNOSIS — F1010 Alcohol abuse, uncomplicated: Principal | ICD-10-CM

## 2021-05-05 DIAGNOSIS — E781 Pure hyperglyceridemia: Secondary | ICD-10-CM | POA: Diagnosis present

## 2021-05-05 DIAGNOSIS — Z20822 Contact with and (suspected) exposure to covid-19: Secondary | ICD-10-CM | POA: Diagnosis present

## 2021-05-05 DIAGNOSIS — I63411 Cerebral infarction due to embolism of right middle cerebral artery: Secondary | ICD-10-CM

## 2021-05-05 DIAGNOSIS — Z87891 Personal history of nicotine dependence: Secondary | ICD-10-CM

## 2021-05-05 DIAGNOSIS — F329 Major depressive disorder, single episode, unspecified: Secondary | ICD-10-CM | POA: Diagnosis present

## 2021-05-05 DIAGNOSIS — Z79899 Other long term (current) drug therapy: Secondary | ICD-10-CM

## 2021-05-05 DIAGNOSIS — I6930 Unspecified sequelae of cerebral infarction: Secondary | ICD-10-CM

## 2021-05-05 DIAGNOSIS — Z8701 Personal history of pneumonia (recurrent): Secondary | ICD-10-CM

## 2021-05-05 DIAGNOSIS — Z87898 Personal history of other specified conditions: Secondary | ICD-10-CM

## 2021-05-05 DIAGNOSIS — R569 Unspecified convulsions: Secondary | ICD-10-CM

## 2021-05-05 DIAGNOSIS — R748 Abnormal levels of other serum enzymes: Secondary | ICD-10-CM

## 2021-05-05 DIAGNOSIS — I69391 Dysphagia following cerebral infarction: Secondary | ICD-10-CM

## 2021-05-05 DIAGNOSIS — R45851 Suicidal ideations: Secondary | ICD-10-CM

## 2021-05-05 DIAGNOSIS — R1312 Dysphagia, oropharyngeal phase: Secondary | ICD-10-CM | POA: Diagnosis present

## 2021-05-05 DIAGNOSIS — Y908 Blood alcohol level of 240 mg/100 ml or more: Secondary | ICD-10-CM | POA: Diagnosis present

## 2021-05-05 DIAGNOSIS — F10129 Alcohol abuse with intoxication, unspecified: Secondary | ICD-10-CM | POA: Diagnosis present

## 2021-05-05 DIAGNOSIS — K7010 Alcoholic hepatitis without ascites: Principal | ICD-10-CM | POA: Diagnosis present

## 2021-05-05 DIAGNOSIS — E785 Hyperlipidemia, unspecified: Secondary | ICD-10-CM | POA: Diagnosis present

## 2021-05-05 DIAGNOSIS — M6282 Rhabdomyolysis: Secondary | ICD-10-CM | POA: Diagnosis present

## 2021-05-05 LAB — COMPREHENSIVE METABOLIC PANEL
ALT: 314 U/L — ABNORMAL HIGH (ref 0–44)
AST: 511 U/L — ABNORMAL HIGH (ref 15–41)
Albumin: 3.3 g/dL — ABNORMAL LOW (ref 3.5–5.0)
Alkaline Phosphatase: 212 U/L — ABNORMAL HIGH (ref 38–126)
Anion gap: 17 — ABNORMAL HIGH (ref 5–15)
BUN: 19 mg/dL (ref 6–20)
CO2: 25 mmol/L (ref 22–32)
Calcium: 7.9 mg/dL — ABNORMAL LOW (ref 8.9–10.3)
Chloride: 93 mmol/L — ABNORMAL LOW (ref 98–111)
Creatinine, Ser: 1.05 mg/dL (ref 0.61–1.24)
GFR, Estimated: >60 mL/min (ref >60)
Glucose, Bld: 106 mg/dL — ABNORMAL HIGH (ref 70–99)
Potassium: 4 mmol/L (ref 3.5–5.1)
Sodium: 135 mmol/L (ref 135–145)
Total Bilirubin: 3.5 mg/dL — ABNORMAL HIGH (ref 0.3–1.2)
Total Protein: 6 g/dL — ABNORMAL LOW (ref 6.5–8.1)

## 2021-05-05 LAB — CBC
HCT: 42.8 % (ref 39.0–52.0)
Hemoglobin: 15.9 g/dL (ref 13.0–17.0)
MCH: 34.1 pg — ABNORMAL HIGH (ref 26.0–34.0)
MCHC: 37.1 g/dL — ABNORMAL HIGH (ref 30.0–36.0)
MCV: 91.8 fL (ref 80.0–100.0)
Platelets: 163 10*3/uL (ref 150–400)
RBC: 4.66 MIL/uL (ref 4.22–5.81)
RDW: 12.5 % (ref 11.5–15.5)
WBC: 6.3 10*3/uL (ref 4.0–10.5)
nRBC: 0 % (ref 0.0–0.2)

## 2021-05-05 LAB — ETHANOL: Alcohol, Ethyl (B): 306 mg/dL (ref <10)

## 2021-05-05 MED ORDER — ONDANSETRON 4 MG PO TBDP
4.0000 mg | ORAL_TABLET | Freq: Four times a day (QID) | ORAL | Status: DC | PRN
  Filled 2021-05-05: qty 1

## 2021-05-05 MED ORDER — LORAZEPAM 1 MG PO TABS: 1.0000 mg | ORAL_TABLET | Freq: Two times a day (BID) | ORAL | Status: DC

## 2021-05-05 MED ORDER — THIAMINE HCL 100 MG/ML IJ SOLN: 100.0000 mg | Freq: Once | INTRAMUSCULAR | Status: DC

## 2021-05-05 MED ORDER — LORAZEPAM 1 MG PO TABS: 1.0000 mg | ORAL_TABLET | Freq: Three times a day (TID) | ORAL | Status: DC

## 2021-05-05 MED ORDER — HYDROXYZINE HCL 25 MG PO TABS: 25.0000 mg | ORAL_TABLET | Freq: Four times a day (QID) | ORAL | Status: DC | PRN

## 2021-05-05 MED ORDER — DIPHENHYDRAMINE HCL 50 MG/ML IJ SOLN: 50.0000 mg | Freq: Once | INTRAMUSCULAR | Status: DC

## 2021-05-05 MED ORDER — LORAZEPAM 1 MG PO TABS: 1.0000 mg | ORAL_TABLET | Freq: Four times a day (QID) | ORAL | Status: DC | PRN

## 2021-05-05 MED ORDER — LOPERAMIDE HCL 2 MG PO CAPS: 2.0000 mg | ORAL_CAPSULE | ORAL | Status: DC | PRN

## 2021-05-05 MED ORDER — DIPHENHYDRAMINE HCL 50 MG/ML IJ SOLN
INTRAMUSCULAR | Status: AC
  Administered 2021-05-05: 50 mg
  Filled 2021-05-05: qty 1

## 2021-05-05 MED ORDER — HALOPERIDOL LACTATE 5 MG/ML IJ SOLN: 5.0000 mg | Freq: Once | INTRAMUSCULAR | Status: DC

## 2021-05-05 MED ORDER — THIAMINE HCL 100 MG PO TABS: 100.0000 mg | ORAL_TABLET | Freq: Every day | ORAL | Status: DC

## 2021-05-05 MED ORDER — LORAZEPAM 2 MG/ML IJ SOLN
INTRAMUSCULAR | Status: AC
  Administered 2021-05-05: 2 mg
  Filled 2021-05-05: qty 1

## 2021-05-05 MED ORDER — LORAZEPAM 1 MG PO TABS: 1.0000 mg | ORAL_TABLET | Freq: Every day | ORAL | Status: DC

## 2021-05-05 MED ORDER — HALOPERIDOL LACTATE 5 MG/ML IJ SOLN
INTRAMUSCULAR | Status: AC
  Administered 2021-05-05: 5 mg
  Filled 2021-05-05: qty 1

## 2021-05-05 MED ORDER — LORAZEPAM 2 MG/ML IJ SOLN: 2.0000 mg | Freq: Once | INTRAMUSCULAR | Status: DC

## 2021-05-05 MED ORDER — LORAZEPAM 1 MG PO TABS
1.0000 mg | ORAL_TABLET | Freq: Four times a day (QID) | ORAL | Status: DC
  Administered 2021-05-06: 1 mg via ORAL
  Filled 2021-05-05: qty 1

## 2021-05-05 MED ORDER — ADULT MULTIVITAMIN W/MINERALS CH
1.0000 | ORAL_TABLET | Freq: Every day | ORAL | Status: DC
  Administered 2021-05-07: 1 via ORAL
  Filled 2021-05-05: qty 1

## 2021-05-05 NOTE — BH Assessment (Signed)
TTS was unable to complete consult due to patient being to intoxicated to participate.

## 2021-05-05 NOTE — BH Assessment (Signed)
Attempted to assess patient with Psyc NP, patient continues to be sleeping. Will attempt at a later time.

## 2021-05-05 NOTE — ED Provider Notes (Signed)
[facility] Emergency Department Provider Note   ____________________________________________   I have reviewed the triage vital signs and the nursing notes.   HISTORY  Chief Complaint Aggressive Behavior and Alcohol Intoxication   History limited by: Intoxication   HPI Mark Oneal is a 35 y.o. male who presents to the emergency department today under IVC because of concern for violent behavior and SI. The patient does admit to alcohol use today. The patient says that at the time of my exam he no longer wants to hurt himself. Per IVC paperwork the patient had also become violent with family. Patient denies any medical complaints.    Records reviewed. Per medical record review patient has a history of alcohol abuse.   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/12/2020   Dysphagia, post-stroke    Aspiration pneumonia (HCC)    Seizures (HCC) 07/07/2020   Hyperlipidemia LDL goal <70 07/07/2020   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   Obesity 07/07/2020   Leukocytosis 07/07/2020   History of CVA with residual deficit    Cerebral infarction due to embolism of R ICA, R MCA vs dissection (HCC) s/p IR w/ revascularization 07/03/2020   Middle cerebral artery embolism, 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   Encephalopathy acute     Past Surgical History:  Procedure Laterality Date   BUBBLE STUDY  07/06/2020   Procedure: BUBBLE STUDY;  Surgeon: Little Ishikawa, MD;  Location: [facility] ENDOSCOPY;  Service: Cardiovascular;;   IR ANGIO INTRA EXTRACRAN SEL COM CAROTID INNOMINATE UNI L MOD SED  07/03/2020   IR CT HEAD LTD  07/03/2020   IR CT HEAD LTD  07/03/2020   IR PERCUTANEOUS ART THROMBECTOMY/INFUSION INTRACRANIAL INC DIAG ANGIO  07/03/2020   IR US GUIDE VASC ACCESS RIGHT  07/03/2020   RADIOLOGY WITH ANESTHESIA N/A 07/03/2020   Procedure: IR WITH ANESTHESIA;  Surgeon: Radiologist, Medication, MD;  Location: [facility] OR;  Service: Radiology;  Laterality: N/A;   TEE WITHOUT CARDIOVERSION N/A 07/06/2020   Procedure: TRANSESOPHAGEAL ECHOCARDIOGRAM (TEE);  Surgeon: Little Ishikawa, MD;  Location: [facility] ENDOSCOPY;  Service: Cardiovascular;  Laterality: N/A;    Prior to Admission medications   Medication Sig Start Date End Date Taking? Authorizing Provider  atorvastatin (LIPITOR) 80 MG tablet Take 1 tablet (80 mg total) by mouth daily. 12/24/20   Jesse Sans, MD  citalopram (CELEXA) 20 MG tablet Take 1 tablet (20 mg total) by mouth at bedtime. 12/24/20   Jesse Sans, MD  clopidogrel (PLAVIX) 75 MG tablet Take 1 tablet (75 mg total) by mouth daily. 12/24/20   Jesse Sans, MD  docusate sodium (COLACE) 100 MG capsule TAKE 1 CAPSULE (100 MG TOTAL) BY MOUTH 2 (TWO) TIMES DAILY. Patient not taking: No sig reported 07/21/20 07/21/21  Love, Evlyn Kanner, PA-C  levETIRAcetam (KEPPRA) 500 MG tablet Take 1 tablet (500 mg total) by mouth 2 (two) times daily. 12/24/20   Jesse Sans, MD  Multiple Vitamin (MULTIVITAMIN WITH MINERALS) TABS tablet Take 1 tablet by mouth daily. Patient  not taking: No sig reported 07/22/20   Love, Evlyn Kanner, PA-C  naltrexone (DEPADE) 50 MG tablet Take 1 tablet (50 mg total) by mouth daily. Patient not taking: No sig reported 12/24/20   Jesse Sans, MD  pantoprazole (PROTONIX) 40 MG tablet Take 1 tablet (40 mg total) by mouth daily. 12/24/20   Jesse Sans, MD  senna-docusate (SENOKOT-S) 8.6-50 MG tablet Take 1 tablet by mouth 2  (two) times daily. Patient not taking: No sig reported 07/12/20   Layne Benton, NP  traZODone (DESYREL) 50 MG tablet Take 1 tablet (50 mg total) by mouth at bedtime as needed for sleep. Patient not taking: No sig reported 12/24/20   Jesse Sans, MD    Allergies Patient has no known allergies.  Family History  Problem Relation Age of Onset   Hypertension Mother    Hypertension Father     Social History Social History   Tobacco Use   Smoking status: Former    Types: Cigarettes   Smokeless tobacco: Never   Tobacco comments:    quit after stroke in 2020  Substance Use Topics   Alcohol use: Yes    Alcohol/week: 3.0 standard drinks    Types: 3 Cans of beer per week    Comment: 20 ounce cans   Drug use: Not Currently    Review of Systems Constitutional: No fever/chills Eyes: No visual changes. ENT: No sore throat. Cardiovascular: Denies chest pain. Respiratory: Denies shortness of breath. Gastrointestinal: No abdominal pain.  No nausea, no vomiting.  No diarrhea.   Genitourinary: Negative for dysuria. Musculoskeletal: Negative for back pain. Skin: Negative for rash. Neurological: Negative for headaches, focal weakness or numbness.  ____________________________________________   PHYSICAL EXAM:  VITAL SIGNS: ED Triage Vitals [05/05/21 1443]  Enc Vitals Group     BP (!) 131/95     Pulse Rate (!) 101     Resp 16     Temp 97.9 F (36.6 C)     Temp Source Oral     SpO2 94 %   Constitutional: Awake and alert. Appears to be intoxicated.  Eyes: Conjunctivae are normal.  ENT      Head: Normocephalic and atraumatic.      Nose: No congestion/rhinnorhea.      Mouth/Throat: Mucous membranes are moist.      Neck: No stridor. Hematological/Lymphatic/Immunilogical: No cervical lymphadenopathy. Cardiovascular: Normal rate, regular rhythm.  No murmurs, rubs, or gallops.  Respiratory: Normal respiratory effort without tachypnea nor retractions. Breath sounds are clear  and equal bilaterally. No wheezes/rales/rhonchi. Gastrointestinal: Soft and non tender. No rebound. No guarding.  Genitourinary: Deferred Musculoskeletal: Normal range of motion in all extremities. No lower extremity edema. Neurologic:  Intoxicated appearing. Moving all extremities.  Skin:  Skin is warm, dry and intact. No rash noted. Psychiatric: Appears intoxicated.   ____________________________________________    LABS (pertinent positives/negatives)  CBC wbc 6.3, hgb 15.9, plt 163 Ethanol 306 CMP na 135, k 4.0, glu 106, elevated liver enzymes.  ____________________________________________   EKG  None  ____________________________________________    RADIOLOGY  None  ____________________________________________   PROCEDURES  Procedures  ____________________________________________   INITIAL IMPRESSION / ASSESSMENT AND PLAN / ED COURSE  Pertinent labs & imaging results that were available during my care of the patient were reviewed by me and considered in my medical decision making (see chart for details).  Patient presents to the emergency department today because of concern for SI and violent behavior in the context of alcohol abuse.  Will continue IVC at this time. Will have psychiatry evaluate.  Patient's blood work here is notable for diffusely elevated liver enzymes.  Patient denies any abdominal pain and had no abdominal tenderness.  This time I do think is likely related to alcohol use.  Will plan on rechecking in the morning.  The patient has been placed in psychiatric observation due to the need to provide a safe environment for the patient while obtaining psychiatric consultation and evaluation, as well as ongoing medical and medication management to treat the patient's condition.  The patient has been placed under full IVC at this time.     ____________________________________________   FINAL CLINICAL IMPRESSION(S) / ED DIAGNOSES  Final diagnoses:   ETOH abuse  Suicidal ideation  Elevated liver enzymes     Note: This dictation was prepared with Dragon dictation. Any transcriptional errors that result from this process are unintentional     Phineas Semen, MD 05/05/21 2015

## 2021-05-05 NOTE — ED Notes (Addendum)
Pt attempting to leave the ER multiple times - needing to be physically redirect to his room by security.  EDp made aware.  IM mediations given as ordered.  No manual hold needed.

## 2021-05-05 NOTE — ED Triage Notes (Signed)
Pt arrives via [employer] in handcuffs for aggressive behavior and alcohol intoxication. [employer] states that wife called and IVC'd pt d/t acting aggressive toward their small children and for being drunk. Upon arrival pt is yelling. [employer] states pt was hitting his head against the window in the patrol car. Pt initially uncooperative but followed commands for dressing out. [employer] states that they recently took him to [facility] for detox from alcohol.   Pt belongings: jeans, underwear, shirt, flip flops, and license.

## 2021-05-05 NOTE — Consult Note (Signed)
Client presented under the influence of alcohol and attempts to leave.  Agitation medications provided, sleeping soundly at this time.  Nanine Means, PMHNP

## 2021-05-06 ENCOUNTER — Emergency Department: Payer: Self-pay

## 2021-05-06 DIAGNOSIS — Z87898 Personal history of other specified conditions: Secondary | ICD-10-CM

## 2021-05-06 DIAGNOSIS — I6930 Unspecified sequelae of cerebral infarction: Secondary | ICD-10-CM

## 2021-05-06 DIAGNOSIS — F1010 Alcohol abuse, uncomplicated: Secondary | ICD-10-CM

## 2021-05-06 DIAGNOSIS — K7010 Alcoholic hepatitis without ascites: Principal | ICD-10-CM | POA: Diagnosis present

## 2021-05-06 DIAGNOSIS — R748 Abnormal levels of other serum enzymes: Secondary | ICD-10-CM

## 2021-05-06 DIAGNOSIS — E785 Hyperlipidemia, unspecified: Secondary | ICD-10-CM

## 2021-05-06 DIAGNOSIS — M6282 Rhabdomyolysis: Secondary | ICD-10-CM

## 2021-05-06 LAB — COMPREHENSIVE METABOLIC PANEL
ALT: 281 U/L — ABNORMAL HIGH (ref 0–44)
AST: 564 U/L — ABNORMAL HIGH (ref 15–41)
Albumin: 3 g/dL — ABNORMAL LOW (ref 3.5–5.0)
Alkaline Phosphatase: 204 U/L — ABNORMAL HIGH (ref 38–126)
BUN: 14 mg/dL (ref 6–20)
CO2: 24 mmol/L (ref 22–32)
Calcium: 8.2 mg/dL — ABNORMAL LOW (ref 8.9–10.3)
Chloride: 89 mmol/L — ABNORMAL LOW (ref 98–111)
Creatinine, Ser: 0.97 mg/dL (ref 0.61–1.24)
GFR, Estimated: >60 mL/min (ref >60)
Glucose, Bld: 131 mg/dL — ABNORMAL HIGH (ref 70–99)
Potassium: 3.9 mmol/L (ref 3.5–5.1)
Sodium: UNDETERMINED mmol/L (ref 135–145)
Total Bilirubin: 4.3 mg/dL — ABNORMAL HIGH (ref 0.3–1.2)
Total Protein: UNDETERMINED g/dL (ref 6.5–8.1)

## 2021-05-06 LAB — URINE DRUG SCREEN, QUALITATIVE (ARMC ONLY)
Amphetamines, Ur Screen: NOT DETECTED
Barbiturates, Ur Screen: NOT DETECTED
Benzodiazepine, Ur Scrn: POSITIVE — AB
Cannabinoid 50 Ng, Ur ~~LOC~~: NOT DETECTED
Cocaine Metabolite,Ur ~~LOC~~: NOT DETECTED
MDMA (Ecstasy)Ur Screen: NOT DETECTED
Methadone Scn, Ur: NOT DETECTED
Opiate, Ur Screen: NOT DETECTED
Phencyclidine (PCP) Ur S: NOT DETECTED
Tricyclic, Ur Screen: NOT DETECTED

## 2021-05-06 LAB — PROTIME-INR
INR: 1.1 (ref 0.8–1.2)
Prothrombin Time: 13.7 seconds (ref 11.4–15.2)

## 2021-05-06 LAB — RESP PANEL BY RT-PCR (FLU A&B, COVID) ARPGX2
Influenza A by PCR: NEGATIVE
Influenza B by PCR: NEGATIVE
SARS Coronavirus 2 by RT PCR: NEGATIVE

## 2021-05-06 LAB — CK: Total CK: 2571 U/L — ABNORMAL HIGH (ref 49–397)

## 2021-05-06 LAB — LIPASE, BLOOD: Lipase: 84 U/L — ABNORMAL HIGH (ref 11–51)

## 2021-05-06 MED ORDER — ATORVASTATIN CALCIUM 20 MG PO TABS
80.0000 mg | ORAL_TABLET | Freq: Every evening | ORAL | Status: DC
  Administered 2021-05-06: 80 mg via ORAL
  Filled 2021-05-06: qty 4

## 2021-05-06 MED ORDER — FOLIC ACID 1 MG PO TABS
1.0000 mg | ORAL_TABLET | Freq: Every day | ORAL | Status: DC
  Administered 2021-05-06 – 2021-05-07 (×2): 1 mg via ORAL
  Filled 2021-05-06 (×2): qty 1

## 2021-05-06 MED ORDER — SODIUM CHLORIDE 0.9 % IV BOLUS
1000.0000 mL | Freq: Once | INTRAVENOUS | Status: AC
  Administered 2021-05-06: 1000 mL via INTRAVENOUS

## 2021-05-06 MED ORDER — LORAZEPAM 1 MG PO TABS: 1.0000 mg | ORAL_TABLET | ORAL | Status: DC | PRN

## 2021-05-06 MED ORDER — SODIUM CHLORIDE 0.9% FLUSH
3.0000 mL | Freq: Two times a day (BID) | INTRAVENOUS | Status: DC
  Administered 2021-05-07: 3 mL via INTRAVENOUS

## 2021-05-06 MED ORDER — LEVETIRACETAM 500 MG PO TABS
500.0000 mg | ORAL_TABLET | Freq: Two times a day (BID) | ORAL | Status: DC
  Administered 2021-05-06 – 2021-05-07 (×2): 500 mg via ORAL
  Filled 2021-05-06 (×3): qty 1

## 2021-05-06 MED ORDER — ZOLPIDEM TARTRATE 5 MG PO TABS
5.0000 mg | ORAL_TABLET | Freq: Every evening | ORAL | Status: DC | PRN
  Administered 2021-05-06: 5 mg via ORAL
  Filled 2021-05-06: qty 1

## 2021-05-06 MED ORDER — SODIUM CHLORIDE 0.9 % IV SOLN: INTRAVENOUS | Status: DC

## 2021-05-06 MED ORDER — THIAMINE HCL 100 MG PO TABS
100.0000 mg | ORAL_TABLET | Freq: Every day | ORAL | Status: DC
  Administered 2021-05-06 – 2021-05-07 (×2): 100 mg via ORAL
  Filled 2021-05-06 (×2): qty 1

## 2021-05-06 MED ORDER — CLOPIDOGREL BISULFATE 75 MG PO TABS
75.0000 mg | ORAL_TABLET | Freq: Every day | ORAL | Status: DC
  Administered 2021-05-06 – 2021-05-07 (×2): 75 mg via ORAL
  Filled 2021-05-06 (×2): qty 1

## 2021-05-06 MED ORDER — THIAMINE HCL 100 MG/ML IJ SOLN: 100.0000 mg | Freq: Every day | INTRAMUSCULAR | Status: DC

## 2021-05-06 MED ORDER — LORAZEPAM 2 MG/ML IJ SOLN: 1.0000 mg | INTRAMUSCULAR | Status: DC | PRN

## 2021-05-06 NOTE — ED Notes (Signed)
Pt given a turkey sandwich tray and a cup of apple juice per request.  

## 2021-05-06 NOTE — H&P (Signed)
History and Physical    Mark Oneal  ZSW:[id number]  DOB: 01-Dec-1985  DOA: 05/05/2021  PCP: Pcp, No Patient coming from: home  Chief Complaint: Intoxicated on admission  HPI:  [name] is a 35 year old Hispanic male with PMH CVA and possibly others (but patient now forth coming with PMH) but does state he is on "several meds" at home but cannot recall any names.  Of note, was also seen at [facility] on 8/10 possibly an ER visit and also noted to have elevated LFTs at that time as well.  He has been seen in the ER recently within the past month for questionable suicidal ideations and was evaluated by psychiatry.  He was not felt to be an imminent risk to himself nor others and was not recommended for any inpatient psychiatric hospitalization.  These episodes are also associated with the patient drinking large amounts of alcohol. He again presented to the ER on 12/05/252 via police department in handcuffs for aggressive behavior and intoxication.  Police had reported that the wife called due to the patient acting aggressive towards their children while he was intoxicated.  Per note review the patient was "hitting his head against the window in the patrol car". It was reported that he was recently at [facility] for detox of alcohol as well. He was again evaluated by psychiatry in the ER on 05/06/2021.  He again was not felt to be a harm to himself or others.  No psychiatric hospitalization was recommended.  He then underwent further work-up medically and was found to have elevated LFTs. AST 564, ALT 281, alk phos 204, T bili 4.3 CK 2571  He was admitted for further work-up for his liver dysfunction and monitoring for any alcohol withdrawal.  I have personally briefly reviewed patient's old medical records in [facility] and discussed patient with the ER provider when appropriate/indicated.  Assessment/Plan: * Alcoholic hepatitis - Patient reports 2-3 beers daily and also consumed a full bottle of  vodka this past week.  Suspect he is still underreporting consumption.  States his last drink was Thursday night - Labs consistent with alcoholic hepatitis although INR not elevated -Madrey's DF = 12 points.  Will repeat again tomorrow but currently no indication for steroids - follow up hepatitis panel - RUQ u/s : Unremarkable parenchyma.  No obvious steatosis or cirrhosis noted -Follow-up CMP in a.m.  Non-traumatic rhabdomyolysis - likely from severe aggression on admission - CK 2,571 on admission - continue IVF - repeat CK in am   Alcohol abuse - Suspect patient underreporting alcohol consumption - Last drink he reports was Thursday night - Monitor on CIWA protocol. - Alcohol level 306 mg/dL on 05/05/2020  Elevated lipase - no true diagnosis of pancreatitis, does not meet criteria - lipase 84 - repeat in am  - continue IVF   Hyperlipidemia LDL goal <70 - Significantly elevated lipid panel in October 2021.  This improved on February 2022 repeat panel - Repeat lipid profile at this time as well -Continue Lipitor  History of seizure - Noted to have seizures back in October 2021.  Neurology placed patient on Keppra at that time.  I also wonder if alcohol was contributing as well to a possible withdrawal seizure - Continue Keppra and follow-up med rec  History of CVA with residual deficit -patient not forth coming on PMH - unclear home meds; await med rec - hospitalized 06/2020: MRI brain showed multifocal infarcts involving right cerebrum and right basal ganglia.  Remote bilateral cerebellar  and right midbrain insults. -Was recommended to remain on monotherapy Plavix after recovering from stroke; he was also continued on Keppra 500 mg twice daily (some concern that alcohol may have contributed to his seizure back then)  - repeat lipid panel (hyperlipemic in ER plus hx of elevated TG last year on lipids) -Did have some oropharyngeal dysphagia back then.  If any concern for  aspiration or difficulty swallowing, will have SLP reevaluate - continue plavix      Code Status:     Code Status: Full Code  DVT Prophylaxis: PADUA<4     Anticipated disposition is to: home  History: 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    Past Surgical History:  Procedure Laterality Date   BUBBLE STUDY  07/06/2020   Procedure: BUBBLE STUDY;  Surgeon: Donato Heinz, MD;  Location: [place];  Service: Cardiovascular;;   IR ANGIO INTRA EXTRACRAN SEL COM CAROTID INNOMINATE UNI L MOD SED  07/03/2020   IR CT HEAD LTD  07/03/2020   IR CT HEAD LTD  07/03/2020   IR PERCUTANEOUS ART THROMBECTOMY/INFUSION INTRACRANIAL INC DIAG ANGIO  07/03/2020   IR US GUIDE VASC ACCESS RIGHT  07/03/2020   RADIOLOGY WITH ANESTHESIA N/A 07/03/2020   Procedure: IR WITH ANESTHESIA;  Surgeon: Radiologist, Medication, MD;  Location: [place];  Service: Radiology;  Laterality: N/A;   TEE WITHOUT CARDIOVERSION N/A 07/06/2020   Procedure: TRANSESOPHAGEAL ECHOCARDIOGRAM (TEE);  Surgeon: Donato Heinz, MD;  Location: [facility] ENDOSCOPY;  Service: Cardiovascular;  Laterality: N/A;     reports that he has quit smoking. His smoking use included cigarettes. He has never used smokeless tobacco. He reports current alcohol use of about 3.0 standard drinks per week. He reports that he does not currently use drugs.  No Known Allergies  Family History  Problem Relation Age of Onset   Hypertension Mother    Hypertension Father    Home Medications: Prior to Admission medications   Medication Sig Start Date End Date Taking? Authorizing Provider  atorvastatin (LIPITOR) 80 MG tablet Take 1 tablet (80 mg total) by mouth daily. 12/24/20   Salley Scarlet, MD  citalopram (CELEXA) 20 MG tablet Take 1 tablet (20 mg total) by mouth at bedtime. 12/24/20   Salley Scarlet, MD  clopidogrel (PLAVIX) 75 MG tablet Take 1 tablet (75 mg total) by mouth daily. 12/24/20    Salley Scarlet, MD  docusate sodium (COLACE) 100 MG capsule TAKE 1 CAPSULE (100 MG TOTAL) BY MOUTH 2 (TWO) TIMES DAILY. Patient not taking: No sig reported 07/21/20 07/21/21  Love, Ivan Anchors, PA-C  levETIRAcetam (KEPPRA) 500 MG tablet Take 1 tablet (500 mg total) by mouth 2 (two) times daily. 12/24/20   Salley Scarlet, MD  Multiple Vitamin (MULTIVITAMIN WITH MINERALS) TABS tablet Take 1 tablet by mouth daily. Patient not taking: No sig reported 07/22/20   Love, Ivan Anchors, PA-C  naltrexone (DEPADE) 50 MG tablet Take 1 tablet (50 mg total) by mouth daily. Patient not taking: No sig reported 12/24/20   Salley Scarlet, MD  pantoprazole (PROTONIX) 40 MG tablet Take 1 tablet (40 mg total) by mouth daily. 12/24/20   Salley Scarlet, MD  senna-docusate (SENOKOT-S) 8.6-50 MG tablet Take 1 tablet by mouth 2 (two) times daily. Patient not taking: No sig reported 07/12/20   Donzetta Starch, NP  traZODone (DESYREL) 50 MG tablet Take 1 tablet (50 mg total) by mouth at bedtime as needed for  sleep. Patient not taking: No sig reported 12/24/20   Salley Scarlet, MD    Review of Systems:  Pertinent items noted in HPI and remainder of comprehensive ROS otherwise negative.  Physical Exam: Vitals:   05/05/21 1443 05/06/21 1147 05/06/21 1155  BP: (!) 131/95 (!) 143/78 (!) 143/78  Pulse: (!) 101 (!) 59 (!) 59  Resp: 16  16  Temp: 97.9 F (36.6 C)  98 F (36.7 C)  TempSrc: Oral  Oral  SpO2: 94%  97%   General appearance: alert, cooperative, and no distress Head: Normocephalic, without obvious abnormality, atraumatic Eyes:  EOMI Lungs: clear to auscultation bilaterally Heart: regular rate and rhythm and S1, S2 normal Abdomen:  Thin, soft, nondistended, nontender, bowel sounds present Extremities:  Thin, no edema Skin: mobility and turgor normal Neurologic: Grossly normal  Labs on Admission:  I have personally reviewed following labs and imaging studies Results for orders placed or performed during the  hospital encounter of 05/05/21 (from the past 24 hour(s))  Comprehensive metabolic panel     Status: Abnormal   Collection Time: 05/06/21 10:16 AM  Result Value Ref Range   Sodium UNABLE TO REPORT DUE TO LIPEMIC INTERFERENCE 135 - 145 mmol/L   Potassium 3.9 3.5 - 5.1 mmol/L   Chloride 89 (L) 98 - 111 mmol/L   CO2 24 22 - 32 mmol/L   Glucose, Bld 131 (H) 70 - 99 mg/dL   BUN 14 6 - 20 mg/dL   Creatinine, Ser 0.97 0.61 - 1.24 mg/dL   Calcium 8.2 (L) 8.9 - 10.3 mg/dL   Total Protein UNABLE TO REPORT DUE TO LIPEMIC INTERFERENCE 6.5 - 8.1 g/dL   Albumin 3.0 (L) 3.5 - 5.0 g/dL   AST 564 (H) 15 - 41 U/L   ALT 281 (H) 0 - 44 U/L   Alkaline Phosphatase 204 (H) 38 - 126 U/L   Total Bilirubin 4.3 (H) 0.3 - 1.2 mg/dL   GFR, Estimated >60 >60 mL/min   Anion gap NOT CALCULATED 5 - 15  CK     Status: Abnormal   Collection Time: 05/06/21 10:16 AM  Result Value Ref Range   Total CK 2,571 (H) 49 - 397 U/L  Lipase, blood     Status: Abnormal   Collection Time: 05/06/21 10:16 AM  Result Value Ref Range   Lipase 84 (H) 11 - 51 U/L  Urine Drug Screen, Qualitative     Status: Abnormal   Collection Time: 05/06/21 10:24 AM  Result Value Ref Range   Tricyclic, Ur Screen NONE DETECTED NONE DETECTED   Amphetamines, Ur Screen NONE DETECTED NONE DETECTED   MDMA (Ecstasy)Ur Screen NONE DETECTED NONE DETECTED   Cocaine Metabolite,Ur [place] NONE DETECTED NONE DETECTED   Opiate, Ur Screen NONE DETECTED NONE DETECTED   Phencyclidine (PCP) Ur S NONE DETECTED NONE DETECTED   Cannabinoid 50 Ng, Ur [place] NONE DETECTED NONE DETECTED   Barbiturates, Ur Screen NONE DETECTED NONE DETECTED   Benzodiazepine, Ur Scrn POSITIVE (A) NONE DETECTED   Methadone Scn, Ur NONE DETECTED NONE DETECTED  Protime-INR     Status: None   Collection Time: 05/06/21  4:25 PM  Result Value Ref Range   Prothrombin Time 13.7 11.4 - 15.2 seconds   INR 1.1 0.8 - 1.2     Radiological Exams on Admission: US ABDOMEN LIMITED RUQ  (LIVER/GB)  Result Date: 05/06/2021 CLINICAL DATA:  Elevated bilirubin EXAM: LIMITED ABDOMINAL ULTRASOUND - RIGHT UPPER QUADRANT COMPARISON:  None FINDINGS: Gallbladder: Physiologically distended without  wall thickening, stones, or pericholecystic fluid. Common bile duct: Normal in caliber, 4.7 mm diameter. Liver: Parenchyma unremarkable. Antegrade color flow signal in the main portal vein. IMPRESSION: Negative.  Normal gallbladder. Electronically Signed   By: Lucrezia Europe M.D.   On: 05/06/2021 14:03   US ABDOMEN LIMITED RUQ (LIVER/GB)  Final Result      Consults called:      Dwyane Dee, MD [facility] 05/06/2021, 5:30 PM

## 2021-05-06 NOTE — Assessment & Plan Note (Addendum)
-   Noted to have seizures back in October 2021.  Neurology placed patient on Keppra at that time.  I also wonder if alcohol was contributing as well to a possible withdrawal seizure - Continue Keppra

## 2021-05-06 NOTE — ED Provider Notes (Signed)
Emergency department handoff note  Care of this patient was signed out to me at the end of the previous provider shift.  All pertinent patient information was conveyed and all questions were answered.  Patient pending admission to the internal medicine service given new onset hepatitis.  I spoke to the patient once again in order to ensure he is comfortable with admission to the hospital and states that this should not be a problem.  Spoke to the on-call hospitalist who agrees with plan for admission to the internal medicine service for further evaluation and management.   Merwyn Katos, MD 05/06/21 [phone]

## 2021-05-06 NOTE — BH Assessment (Signed)
Comprehensive Clinical Assessment (CCA) Note  05/06/2021 Mark PillingKevin Oneal [id number]  Chief Complaint: Patient is a 35 year old male [facility]C. Per triage note Pt arrives via [employer] in handcuffs for aggressive behavior and alcohol intoxication. [employer] states that wife called and IVC'd pt d/t acting aggressive toward their small children and for being drunk. Upon arrival pt is yelling. [employer] states pt was hitting his head against the window in the patrol car. Pt initially uncooperative but followed commands for dressing out. [employer] states that they recently took [facility] to [facility] for detox from alcohol. During assessment patient appears alert and oriented x4, calm and cooperative. Patient was able to recall why he is in the hospital "because I drink too much alcohol." Patient reports that he lives with his wife and his wife wants the patient to stop drinking. Patient reports that he drinks daily and has only had 6 days of sobriety. Patient BAL upon arrival was 306. Patient reports that he wants to stop drinking on a Outpatient basis. Patient denies SI/HI/AH/VH and does not appear to be responding to internal or external stimuli.  Per Psyc NP Lerry Linerashaun Dixon patient does not meet criteria for Inpatient Hospitalization Chief Complaint  Patient presents with   Aggressive Behavior   Alcohol Intoxication   Visit Diagnosis: Alcohol Use Disorder, severe    CCA Screening, Triage and Referral (STR)  Patient Reported Information How did you hear about us? Legal System  Referral name: No data recorded Referral phone number: No data recorded  Whom do you see for routine medical problems? Other (Comment)  Practice/Facility Name: No data recorded Practice/Facility Phone Number: No data recorded Name of Contact: No data recorded Contact Number: No data recorded Contact Fax Number: No data recorded Prescriber Name: No data recorded Prescriber Address (if known): No data recorded  What Is the Reason for  Your Visit/Call Today? Patient presents under IVC due to alcohol intoxication and aggressive behavior  How Long Has This Been Causing You Problems? > than 6 months  What Do You Feel Would Help You the Most Today? Treatment for Depression or other mood problem; Alcohol or Drug Use Treatment   Have You Recently Been in Any Inpatient Treatment (Hospital/Detox/Crisis Center/28-Day Program)? No  Name/Location of Program/Hospital:No data recorded How Long Were You There? No data recorded When Were You Discharged? No data recorded  [facility] You [facility]? No  Who Do You See at [facility]? No data recorded  Have You Recently Had Any Thoughts About Hurting Yourself? No  Are You Planning to Commit Suicide/Harm Yourself At This time? No   Have you Recently Had Thoughts About Hurting Someone Karolee Ohslse? No  Explanation: No data recorded  Have You Used Any Alcohol or Drugs in the Past 24 Hours? Yes  How Long Ago Did You Use Drugs or Alcohol? No data recorded What Did You Use and How Much? Alcohol   Do You Currently Have a Therapist/Psychiatrist? No  Name of Therapist/Psychiatrist: No data recorded  Have You Been Recently Discharged From Any Office Practice or Programs? No  Explanation of Discharge From Practice/Program: No data recorded    CCA Screening Triage Referral Assessment Type of Contact: Face-to-Face  Is this Initial or Reassessment? No data recorded Date Telepsych consult ordered in [facility]:  No data recorded Time Telepsych consult ordered in [facility]:  No data recorded  Patient Reported Information Reviewed? Yes  Patient Left Without Being Seen? No data recorded Reason for Not  Completing Assessment: No data recorded  Collateral Involvement: No data recorded  Does Patient Have a Court Appointed Legal Guardian? No data recorded Name and Contact of Legal Guardian: No data recorded If Minor and Not Living with Parent(s), Who has Custody? No data  recorded Is CPS involved or ever been involved? Never  Is APS involved or ever been involved? Never   Patient Determined To Be At Risk for Harm To Self or Others Based on Review of Patient Reported Information or Presenting Complaint? No  Method: No data recorded Availability of Means: No data recorded Intent: No data recorded Notification Required: No data recorded Additional Information for Danger to Others Potential: No data recorded Additional Comments for Danger to Others Potential: No data recorded Are There Guns or Other Weapons in Your Home? No data recorded Types of Guns/Weapons: No data recorded Are These Weapons Safely Secured?                            No data recorded Who Could Verify You Are Able To Have These Secured: No data recorded Do You Have any Outstanding Charges, Pending Court Dates, Parole/Probation? No data recorded Contacted To Inform of Risk of Harm To Self or Others: No data recorded  Location of Assessment: [facility] ED   Does Patient Present under Involuntary Commitment? Yes  IVC Papers Initial File Date: 05/05/21   [place] of Residence: [place]   Patient Currently Receiving the Following Services: Not Receiving Services   Determination of Need: Emergent (2 hours)   Options For Referral: ED Visit     CCA Biopsychosocial Intake/Chief Complaint:  Patient presents to the ED under IVC due to being intoxicated with SI and an attempt to hurt himself  Current Symptoms/Problems: Patient presents to the ED under IVC due to being intoxicated with SI and an attempt to hurt himself   Patient Reported Schizophrenia/Schizoaffective Diagnosis in Past: No   Strengths: Have support, able to speak English and have some insight.  Preferences: Unknown  Abilities: Unknown   Type of Services Patient Feels are Needed: Unknown   Initial Clinical Notes/Concerns: None   Mental Health Symptoms Depression:   None   Duration of Depressive symptoms:   Greater than two weeks   Mania:   None   Anxiety:    None   Psychosis:   None   Duration of Psychotic symptoms: No data recorded  Trauma:   None   Obsessions:   None   Compulsions:   None   Inattention:   None   Hyperactivity/Impulsivity:   None   Oppositional/Defiant Behaviors:   None   Emotional Irregularity:   None   Other Mood/Personality Symptoms:  No data recorded   Mental Status Exam Appearance and self-care  Stature:   Small   Weight:   Average weight   Clothing:   Neat/clean; Age-appropriate   Grooming:   Normal   Cosmetic use:   None   Posture/gait:   Normal   Motor activity:   -- (within normal range)   Sensorium  Attention:   Normal   Concentration:   Normal   Orientation:   X5   Recall/memory:   Normal   Affect and Mood  Affect:   Appropriate   Mood:   Anxious   Relating  Eye contact:   Normal   Facial expression:   Anxious; Responsive   Attitude toward examiner:   Cooperative   Thought and Language  Speech  flow:  Clear and Coherent   Thought content:   Appropriate to Mood and Circumstances   Preoccupation:   None   Hallucinations:   None   Organization:  No data recorded  [employer] of Knowledge:   Fair   Intelligence:   Average   Abstraction:   Functional   Judgement:   Impaired   Reality Testing:   Realistic   Insight:   Fair   Decision Making:   Impulsive   Social Functioning  Social Maturity:   Impulsive   Social Judgement:   Normal; "Chief of Staff"   Stress  Stressors:   Family conflict   Coping Ability:   Normal   Skill Deficits:   None   Supports:   Family     Religion: Religion/Spirituality Are You A Religious Person?: No  Leisure/Recreation: Leisure / Recreation Do You Have Hobbies?: No  Exercise/Diet: Exercise/Diet Do You Exercise?: No Have You Gained or Lost A Significant Amount of Weight in the Past Six Months?: No Do  You Follow a Special Diet?: No Do You Have Any Trouble Sleeping?: No   CCA Employment/Education Employment/Work Situation: Employment / Work Situation Employment Situation: Employed Work Stressors: None reported Patient's Job has Been Impacted by Current Illness: No Has Patient ever Been in Equities trader?: No  Education: Education Did Theme park manager?: No Did You Have An Individualized Education Program (IIEP): No Did You Have Any Difficulty At [employer]?: No Patient's Education Has Been Impacted by Current Illness: No   CCA Family/Childhood History Family and Relationship History: Family history Marital status: Married What types of issues is patient dealing with in the relationship?: Patient's alcohol abuse causes strains in his relationship Additional relationship information: Alcohol abuse and aggressive behavior towards his family Does patient have children?: Yes How many children?: 2 How is patient's relationship with their children?: He states it's good  Childhood History:  Childhood History Did patient suffer any verbal/emotional/physical/sexual abuse as a child?: No Did patient suffer from severe childhood neglect?: No Has patient ever been sexually abused/assaulted/raped as an adolescent or adult?: No Was the patient ever a victim of a crime or a disaster?: No Witnessed domestic violence?: No Has patient been affected by domestic violence as an adult?: No  Child/Adolescent Assessment:     CCA Substance Use Alcohol/Drug Use: Alcohol / Drug Use Pain Medications: See PTA Prescriptions: See PTA Over the Counter: See PTA History of alcohol / drug use?: Yes Longest period of sobriety (when/how long): Unable to quantify Negative Consequences of Use: Personal relationships, Financial Substance #1 Name of Substance 1: Alcohol 1 - Frequency: daily 1 - Last Use / Amount: 05/05/21                       ASAM's:  Six Dimensions of Multidimensional  Assessment  Dimension 1:  Acute Intoxication and/or Withdrawal Potential:      Dimension 2:  Biomedical Conditions and Complications:      Dimension 3:  Emotional, Behavioral, or Cognitive Conditions and Complications:     Dimension 4:  Readiness to Change:     Dimension 5:  Relapse, Continued use, or Continued Problem Potential:     Dimension 6:  Recovery/Living Environment:     ASAM Severity Score:    ASAM Recommended Level of Treatment:     Substance use Disorder (SUD) Substance Use Disorder (SUD)  Checklist Symptoms of Substance Use: Continued use despite having a persistent/recurrent physical/psychological problem caused/exacerbated by use, Presence  of craving or strong urge to use, Social, occupational, recreational activities given up or reduced due to use, Evidence of tolerance, Continued use despite persistent or recurrent social, interpersonal problems, caused or exacerbated by use, Large amounts of time spent to obtain, use or recover from the substance(s), Recurrent use that results in a failure to fulfill major role obligations (work, school, home), Substance(s) often taken in larger amounts or over longer times than was intended, Repeated use in physically hazardous situations, Persistent desire or unsuccessful efforts to cut down or control use  Recommendations for Services/Supports/Treatments: Recommendations for Services/Supports/Treatments Recommendations For Services/Supports/Treatments: IOP (Intensive Outpatient Program), Individual Therapy, SAIOP (Substance Abuse Intensive Outpatient Program)  DSM5 Diagnoses: Patient Active Problem List   Diagnosis Date Noted   Substance induced mood disorder (HCC) 04/09/2021   Suicidal ideation    Alcohol dependence with uncomplicated intoxication (HCC)    Alcohol abuse 12/21/2020   Severe major depression, single episode, without psychotic features (HCC) 12/21/2020   Reactive depression 09/27/2020   Elevated liver function tests  08/02/2020   Hemi-inattention--left 07/21/2020   Acute ischemic right MCA stroke (HCC) 07/12/2020   Dysphagia, post-stroke    Aspiration pneumonia (HCC)    Seizures (HCC) 07/07/2020   Hyperlipidemia LDL goal <70 07/07/2020   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   Obesity 07/07/2020   Leukocytosis 07/07/2020   History of CVA with residual deficit    Cerebral infarction due to embolism of R ICA, R MCA vs dissection (HCC) s/p IR w/ revascularization 07/03/2020   Middle cerebral artery embolism, 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   Encephalopathy acute     Patient Centered Plan: Patient is on the following Treatment Plan(s):  Substance Abuse   Referrals to Alternative Service(s): Referred to Alternative Service(s):   Place:   Date:   Time:    Referred to Alternative Service(s):   Place:   Date:   Time:    Referred to Alternative Service(s):   Place:   Date:   Time:    Referred to Alternative Service(s):   Place:   Date:   Time:     Raymond Azure A Ramata Strothman, LCAS-A

## 2021-05-06 NOTE — ED Notes (Signed)
Breakfast tray given to pt 

## 2021-05-06 NOTE — Assessment & Plan Note (Addendum)
About- Patient reports 2-3 beers daily and also consumed a full bottle of vodka this past week.  Suspect he is still underreporting consumption.  States his last drink was Thursday night - Labs consistent with alcoholic hepatitis although INR not elevated -Madrey's DF = 12 points.  Will repeat again tomorrow but currently no indication for steroids - follow up hepatitis panel - RUQ u/s : Unremarkable parenchyma.  No obvious steatosis or cirrhosis noted -LFTs downtrending.  Informed patient about quitting drinking very strongly

## 2021-05-06 NOTE — ED Notes (Signed)
Report given to [facility] MD  Dr Rexene Edison   - interpreter on a stick placed in his room

## 2021-05-06 NOTE — Assessment & Plan Note (Addendum)
-   Significantly elevated lipid panel in October 2021.  This improved on February 2022 repeat panel; appears that he probably ran out of medications since February due to lipemia noted on lab work this hospitalization -Lipid profile was repeated: Cholesterol 383.  Triglycerides 1630 -Prescriptions were sent for Lipitor, fish oil, fenofibrate.  Patient heavily counseled on compliance with regimen and establishing with primary care at discharge.  Offered patient to remain in hospital for further triglyceride treatment and he declined

## 2021-05-06 NOTE — Assessment & Plan Note (Addendum)
-   Suspect patient underreporting alcohol consumption - Last drink he reports was Thursday night -Did not have any withdrawal symptoms during hospitalization - Alcohol level 306 mg/dL on 2/88/3374

## 2021-05-06 NOTE — Assessment & Plan Note (Addendum)
-   no true diagnosis of pancreatitis, does not meet criteria - lipase 84 -Lipase level 80 at discharge - Etiology considered to be due to underlying severe hypertriglyceridemia as well as superimposed alcohol use.

## 2021-05-06 NOTE — ED Provider Notes (Signed)
Emergency Medicine Observation Re-evaluation Note  Mark Oneal is a 35 y.o. male, seen on rounds today.  Pt initially presented to the ED for complaints of Aggressive Behavior and Alcohol Intoxication Currently, the patient is resting, voices no medical complaints.  Physical Exam  BP (!) 131/95 (BP Location: Left Arm)   Pulse (!) 101   Temp 97.9 F (36.6 C) (Oral)   Resp 16   SpO2 94%  Physical Exam General: Resting in no acute Cardiac: No cyanosis Lungs: Equal rise and fall Psych: Not agitated  ED Course / MDM  EKG:   I have reviewed the labs performed to date as well as medications administered while in observation.  Recent changes in the last 24 hours include no events overnight.  Plan  Current plan is for [facility] consultation at the recommendation of psychiatric NP. Mark Oneal is under involuntary commitment.      Irean Hong, MD 05/06/21 [phone]

## 2021-05-06 NOTE — ED Notes (Signed)
Called consult to [facility]) for psych per Dr. Dolores Frame.

## 2021-05-06 NOTE — Assessment & Plan Note (Signed)
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  Remote bilateral cerebellar and right midbrain insults. -Was recommended to remain on monotherapy Plavix after recovering from stroke; he was also continued on Keppra 500 mg twice daily (some concern that alcohol may have contributed to his seizure back then)  - repeat lipid panel (hyperlipemic in ER plus hx of elevated TG last year on lipids) -Did have some oropharyngeal dysphagia back then.  If any concern for aspiration or difficulty swallowing, will have SLP reevaluate - continue plavix

## 2021-05-06 NOTE — Assessment & Plan Note (Addendum)
-   likely from severe aggression on admission - CK 2,571 on admission; improved to 1719 at discharge -Responded to fluids and encouraged patient to remain off of alcohol and continue hydration

## 2021-05-06 NOTE — ED Notes (Signed)
MD at bedside. 

## 2021-05-06 NOTE — Hospital Course (Signed)
Mark Oneal is a 34 year old Hispanic male with PMH CVA and possibly others (but patient now forth coming with PMH) but does state he is on "several meds" at home but cannot recall any names.  Of note, was also seen at [facility] on 8/10 possibly an ER visit and also noted to have elevated LFTs at that time as well.  He has been seen in the ER recently within the past month for questionable suicidal ideations and was evaluated by psychiatry.  He was not felt to be an imminent risk to himself nor others and was not recommended for any inpatient psychiatric hospitalization.  These episodes are also associated with the patient drinking large amounts of alcohol. He again presented to the ER on 8/00/1239 via police department in handcuffs for aggressive behavior and intoxication.  Police had reported that the wife called due to the patient acting aggressive towards their children while he was intoxicated.  Per note review the patient was "hitting his head against the window in the patrol car". It was reported that he was recently at [facility] for detox of alcohol as well. He was again evaluated by psychiatry in the ER on 05/06/2021.  He again was not felt to be a harm to himself or others.  No psychiatric hospitalization was recommended.  He then underwent further work-up medically and was found to have elevated LFTs. AST 564, ALT 281, alk phos 204, T bili 4.3 CK 2571  He was admitted for further work-up for his liver dysfunction and monitoring for any alcohol withdrawal.

## 2021-05-06 NOTE — ED Notes (Signed)
Rescinded and consult done patient going to be medically admitted

## 2021-05-06 NOTE — Consult Note (Signed)
[facility] Face-to-Face Psychiatry Consult   Reason for Consult:  psych eval Referring Physician:  Dr. Derrill Kay Patient Identification: Mark Oneal MRN:  [id number] Principal Diagnosis: <principal problem not specified> Diagnosis:  Active Problems:   * No active hospital problems. *   Total Time spent with patient: 45 minutes    HPI:  Mark Oneal, 35 y.o., male patient presented to [facility] under IVC.  Patient seen by TTS and this provider; chart reviewed and consulted with Dr. Derrill Kay on 05/06/21.  On evaluation [name] reports, per TTS Patient is a 35 year old male presenting to [facility] ED under IVC. Per triage note Pt arrives via [employer] in handcuffs for aggressive behavior and alcohol intoxication. [employer] states that wife called and IVC'd pt d/t acting aggressive toward their small children and for being drunk. Upon arrival pt is yelling. [employer] states pt was hitting his head against the window in the patrol car. Pt initially uncooperative but followed commands for dressing out. [employer] states that they recently took him to [facility] for detox from alcohol. During assessment patient appears alert and oriented x4, calm and cooperative. Patient was able to recall why he is in the hospital "because I drink too much alcohol." Patient reports that he lives with his wife and his wife wants the patient to stop drinking. Patient reports that he drinks daily and has only had 6 days of sobriety. Patient BAL upon arrival was 306. Patient reports that he wants to stop drinking on a Outpatient basis. Patient denies SI/HI/AH/VH and does not appear to be responding to internal or external stimuli    Recommendations: Discharge when medically stable  Past Psychiatric History: Alcohol abuse, moderate  Risk to Self:   Risk to Others:   Prior Inpatient Therapy:   Prior Outpatient Therapy:    Past Medical History:  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    Past Surgical History:   Procedure Laterality Date   BUBBLE STUDY  07/06/2020   Procedure: BUBBLE STUDY;  Surgeon: Little Ishikawa, MD;  Location: [facility];  Service: Cardiovascular;;   IR ANGIO INTRA EXTRACRAN SEL COM CAROTID INNOMINATE UNI L MOD SED  07/03/2020   IR CT HEAD LTD  07/03/2020   IR CT HEAD LTD  07/03/2020   IR PERCUTANEOUS ART THROMBECTOMY/INFUSION INTRACRANIAL INC DIAG ANGIO  07/03/2020   IR US GUIDE VASC ACCESS RIGHT  07/03/2020   RADIOLOGY WITH ANESTHESIA N/A 07/03/2020   Procedure: IR WITH ANESTHESIA;  Surgeon: Radiologist, Medication, MD;  Location: [facility] OR;  Service: Radiology;  Laterality: N/A;   TEE WITHOUT CARDIOVERSION N/A 07/06/2020   Procedure: TRANSESOPHAGEAL ECHOCARDIOGRAM (TEE);  Surgeon: Little Ishikawa, MD;  Location: [facility] ENDOSCOPY;  Service: Cardiovascular;  Laterality: N/A;   Family History:  Family History  Problem Relation Age of Onset   Hypertension Mother    Hypertension Father    Family Psychiatric  History:  Social History:  Social History   Substance and Sexual Activity  Alcohol Use Yes   Alcohol/week: 3.0 standard drinks   Types: 3 Cans of beer per week   Comment: 20 ounce cans     Social History   Substance and Sexual Activity  Drug Use Not Currently    Social History   Socioeconomic History   Marital status: Single    Spouse name: Mark Oneal   Number of children: 2   Years of education: Not on file   Highest education level: Not on file  Occupational  History   Not on file  Tobacco Use   Smoking status: Former    Types: Cigarettes   Smokeless tobacco: Never   Tobacco comments:    quit after stroke in 2020  Substance and Sexual Activity   Alcohol use: Yes    Alcohol/week: 3.0 standard drinks    Types: 3 Cans of beer per week    Comment: 20 ounce cans   Drug use: Not Currently   Sexual activity: Not on file  Other Topics Concern   Not on file  Social History Narrative   Lives with his partner of 8 years and has 2 kids with her.  All of his family is in a different country.   Social Determinants of Health   Financial Resource Strain: Not on file  Food Insecurity: Not on file  Transportation Needs: Not on file  Physical Activity: Not on file  Stress: Not on file  Social Connections: Not on file   Additional Social History:    Allergies:  No Known Allergies  Labs:  Results for orders placed or performed during the hospital encounter of 05/05/21 (from the past 48 hour(s))  Comprehensive metabolic panel     Status: Abnormal   Collection Time: 05/05/21  3:00 PM  Result Value Ref Range   Sodium 135 135 - 145 mmol/L    Comment: POST-ULTRACENTRIFUGATION CORRECTED ON 08/20 AT 1602: PREVIOUSLY REPORTED AS 135 ULTRA    Potassium 4.0 3.5 - 5.1 mmol/L   Chloride 93 (L) 98 - 111 mmol/L   CO2 25 22 - 32 mmol/L   Glucose, Bld 106 (H) 70 - 99 mg/dL    Comment: Glucose reference range applies only to samples taken after fasting for at least 8 hours.   BUN 19 6 - 20 mg/dL   Creatinine, Ser 6.57 0.61 - 1.24 mg/dL   Calcium 7.9 (L) 8.9 - 10.3 mg/dL   Total Protein 6.0 (L) 6.5 - 8.1 g/dL    Comment: POST-ULTRACENTRIFUGATION CORRECTED ON 08/20 AT 1602: PREVIOUSLY REPORTED AS 6.0 ULTRA    Albumin 3.3 (L) 3.5 - 5.0 g/dL   AST 846 (H) 15 - 41 U/L   ALT 314 (H) 0 - 44 U/L   Alkaline Phosphatase 212 (H) 38 - 126 U/L   Total Bilirubin 3.5 (H) 0.3 - 1.2 mg/dL   GFR, Estimated >96 >29 mL/min    Comment: (NOTE) Calculated using the CKD-EPI Creatinine Equation (2021)    Anion gap 17 (H) 5 - 15    Comment: Performed at [facility], [address] [place], [place] [address]  Ethanol     Status: Abnormal   Collection Time: 05/05/21  3:00 PM  Result Value Ref Range   Alcohol, Ethyl (B) 306 (HH) <10 mg/dL    Comment: CRITICAL RESULT CALLED TO, READ BACK BY AND VERIFIED WITH LES WILLOUGHBY ON 05/05/21 AT 1533 QSD (NOTE) Lowest detectable limit for serum alcohol is 10 mg/dL.  For medical purposes only. Performed  at [facility], [address] [place], [place] [address]   cbc     Status: Abnormal   Collection Time: 05/05/21  3:00 PM  Result Value Ref Range   WBC 6.3 4.0 - 10.5 K/uL   RBC 4.66 4.22 - 5.81 MIL/uL   Hemoglobin 15.9 13.0 - 17.0 g/dL   HCT 10.2 72.5 - 36.6 %   MCV 91.8 80.0 - 100.0 fL   MCH 34.1 (H) 26.0 - 34.0 pg   MCHC 37.1 (H) 30.0 - 36.0  g/dL   RDW 60.412.5 54.011.5 - 98.115.5 %   Platelets 163 150 - 400 K/uL   nRBC 0.0 0.0 - 0.2 %    Comment: Performed at [facility], [address] [place], [place] [id number]    Current Facility-Administered Medications  Medication Dose Route Frequency Provider Last Rate Last Admin   diphenhydrAMINE (BENADRYL) injection 50 mg  50 mg Intramuscular Once Charm RingsLord, Jamison Y, NP       haloperidol lactate (HALDOL) injection 5 mg  5 mg Intramuscular Once Charm RingsLord, Jamison Y, NP       hydrOXYzine (ATARAX/VISTARIL) tablet 25 mg  25 mg Oral Q6H PRN Charm RingsLord, Jamison Y, NP       loperamide (IMODIUM) capsule 2-4 mg  2-4 mg Oral PRN Charm RingsLord, Jamison Y, NP       LORazepam (ATIVAN) injection 2 mg  2 mg Intramuscular Once Charm RingsLord, Jamison Y, NP       LORazepam (ATIVAN) tablet 1 mg  1 mg Oral Q6H PRN Charm RingsLord, Jamison Y, NP       LORazepam (ATIVAN) tablet 1 mg  1 mg Oral QID Charm RingsLord, Jamison Y, NP       Followed by   Melene Muller[START ON 05/07/2021] LORazepam (ATIVAN) tablet 1 mg  1 mg Oral TID Charm RingsLord, Jamison Y, NP       Followed by   Melene Muller[START ON 05/08/2021] LORazepam (ATIVAN) tablet 1 mg  1 mg Oral BID Charm RingsLord, Jamison Y, NP       Followed by   Melene Muller[START ON 05/09/2021] LORazepam (ATIVAN) tablet 1 mg  1 mg Oral Daily Charm RingsLord, Jamison Y, NP       multivitamin with minerals tablet 1 tablet  1 tablet Oral Daily Charm RingsLord, Jamison Y, NP       ondansetron (ZOFRAN-ODT) disintegrating tablet 4 mg  4 mg Oral Q6H PRN Charm RingsLord, Jamison Y, NP       thiamine (B-1) injection 100 mg  100 mg Intramuscular Once Charm RingsLord, Jamison Y, NP       thiamine tablet 100 mg  100 mg Oral Daily Charm RingsLord, Jamison Y, NP       Current  Outpatient Medications  Medication Sig Dispense Refill   atorvastatin (LIPITOR) 80 MG tablet Take 1 tablet (80 mg total) by mouth daily. 30 tablet 1   citalopram (CELEXA) 20 MG tablet Take 1 tablet (20 mg total) by mouth at bedtime. 30 tablet 1   clopidogrel (PLAVIX) 75 MG tablet Take 1 tablet (75 mg total) by mouth daily. 30 tablet 1   docusate sodium (COLACE) 100 MG capsule TAKE 1 CAPSULE (100 MG TOTAL) BY MOUTH 2 (TWO) TIMES DAILY. (Patient not taking: No sig reported) 60 capsule 0   levETIRAcetam (KEPPRA) 500 MG tablet Take 1 tablet (500 mg total) by mouth 2 (two) times daily. 60 tablet 1   Multiple Vitamin (MULTIVITAMIN WITH MINERALS) TABS tablet Take 1 tablet by mouth daily. (Patient not taking: No sig reported) 30 tablet 0   naltrexone (DEPADE) 50 MG tablet Take 1 tablet (50 mg total) by mouth daily. (Patient not taking: No sig reported) 30 tablet 1   pantoprazole (PROTONIX) 40 MG tablet Take 1 tablet (40 mg total) by mouth daily. 30 tablet 1   senna-docusate (SENOKOT-S) 8.6-50 MG tablet Take 1 tablet by mouth 2 (two) times daily. (Patient not taking: No sig reported)     traZODone (DESYREL) 50 MG tablet Take 1 tablet (50 mg total) by mouth at bedtime as needed for sleep. (Patient not taking: No  sig reported) 30 tablet 1    Musculoskeletal: Strength & Muscle Tone: within normal limits Gait & Station: normal Patient leans: Right     Physical Exam: Physical Exam Vitals and nursing note reviewed.  Constitutional:      Appearance: Normal appearance.  HENT:     Head: Normocephalic and atraumatic.     Nose: Nose normal.  Eyes:     Pupils: Pupils are equal, round, and reactive to light.  Musculoskeletal:        General: Normal range of motion.     Cervical back: Normal range of motion.  Skin:    General: Skin is warm and dry.  Neurological:     General: No focal deficit present.     Mental Status: He is alert and oriented to person, place, and time. Mental status is at  baseline.  Psychiatric:        Attention and Perception: Attention normal.        Mood and Affect: Affect is flat.        Speech: Speech is slurred.        Behavior: Behavior normal. Behavior is cooperative.        Thought Content: Thought content normal.        Cognition and Memory: Cognition normal.        Judgment: Judgment is impulsive.   Review of Systems  Psychiatric/Behavioral:  Positive for substance abuse. Negative for hallucinations. The patient is not nervous/anxious and does not have insomnia.   All other systems reviewed and are negative. Blood pressure (!) 131/95, pulse (!) 101, temperature 97.9 F (36.6 C), temperature source Oral, resp. rate 16, SpO2 94 %. There is no height or weight on file to calculate BMI.   Disposition: No evidence of imminent risk to self or others at present.   Patient does not meet criteria for psychiatric inpatient admission. Supportive therapy provided about ongoing stressors. Discussed crisis plan, support from social network, calling 911, coming to the Emergency Department, and calling Suicide Hotline.  Jearld Lesch, NP 05/06/2021 6:14 AM

## 2021-05-07 ENCOUNTER — Other Ambulatory Visit: Payer: Self-pay

## 2021-05-07 LAB — MAGNESIUM: Magnesium: 1.8 mg/dL (ref 1.7–2.4)

## 2021-05-07 LAB — COMPREHENSIVE METABOLIC PANEL
ALT: 244 U/L — ABNORMAL HIGH (ref 0–44)
AST: 462 U/L — ABNORMAL HIGH (ref 15–41)
Albumin: 2.6 g/dL — ABNORMAL LOW (ref 3.5–5.0)
Alkaline Phosphatase: 239 U/L — ABNORMAL HIGH (ref 38–126)
Anion gap: 6 (ref 5–15)
BUN: 7 mg/dL (ref 6–20)
CO2: 25 mmol/L (ref 22–32)
Calcium: 8 mg/dL — ABNORMAL LOW (ref 8.9–10.3)
Chloride: 102 mmol/L (ref 98–111)
Creatinine, Ser: 0.78 mg/dL (ref 0.61–1.24)
GFR, Estimated: >60 mL/min (ref >60)
Glucose, Bld: 112 mg/dL — ABNORMAL HIGH (ref 70–99)
Potassium: 3.7 mmol/L (ref 3.5–5.1)
Sodium: 133 mmol/L — ABNORMAL LOW (ref 135–145)
Total Bilirubin: 3.6 mg/dL — ABNORMAL HIGH (ref 0.3–1.2)
Total Protein: 4.9 g/dL — ABNORMAL LOW (ref 6.5–8.1)

## 2021-05-07 LAB — HEPATITIS PANEL, ACUTE
HCV Ab: NONREACTIVE
Hep A IgM: NONREACTIVE
Hep B C IgM: NONREACTIVE
Hepatitis B Surface Ag: NONREACTIVE

## 2021-05-07 LAB — CBC WITH DIFFERENTIAL/PLATELET
Abs Immature Granulocytes: 0.02 10*3/uL (ref 0.00–0.07)
Basophils Absolute: 0 10*3/uL (ref 0.0–0.1)
Basophils Relative: 1 %
Eosinophils Absolute: 0.1 10*3/uL (ref 0.0–0.5)
Eosinophils Relative: 1 %
HCT: 34.7 % — ABNORMAL LOW (ref 39.0–52.0)
Hemoglobin: 12.8 g/dL — ABNORMAL LOW (ref 13.0–17.0)
Immature Granulocytes: 0 %
Lymphocytes Relative: 47 %
Lymphs Abs: 2.1 10*3/uL (ref 0.7–4.0)
MCH: 34.4 pg — ABNORMAL HIGH (ref 26.0–34.0)
MCHC: 36.9 g/dL — ABNORMAL HIGH (ref 30.0–36.0)
MCV: 93.3 fL (ref 80.0–100.0)
Monocytes Absolute: 0.2 10*3/uL (ref 0.1–1.0)
Monocytes Relative: 5 %
Neutro Abs: 2.1 10*3/uL (ref 1.7–7.7)
Neutrophils Relative %: 46 %
Platelets: 87 10*3/uL — ABNORMAL LOW (ref 150–400)
RBC: 3.72 MIL/uL — ABNORMAL LOW (ref 4.22–5.81)
RDW: 12.1 % (ref 11.5–15.5)
WBC: 4.5 10*3/uL (ref 4.0–10.5)
nRBC: 0 % (ref 0.0–0.2)

## 2021-05-07 LAB — LIPID PANEL
Cholesterol: 383 mg/dL — ABNORMAL HIGH (ref 0–200)
LDL Cholesterol: UNDETERMINED mg/dL (ref 0–99)
Triglycerides: 1630 mg/dL — ABNORMAL HIGH (ref <150)
VLDL: UNDETERMINED mg/dL (ref 0–40)

## 2021-05-07 LAB — LDL CHOLESTEROL, DIRECT: Direct LDL: UNDETERMINED mg/dL (ref 0–99)

## 2021-05-07 LAB — PROTIME-INR
INR: 1.1 (ref 0.8–1.2)
Prothrombin Time: 13.9 seconds (ref 11.4–15.2)

## 2021-05-07 LAB — CK: Total CK: 1719 U/L — ABNORMAL HIGH (ref 49–397)

## 2021-05-07 LAB — LIPASE, BLOOD: Lipase: 80 U/L — ABNORMAL HIGH (ref 11–51)

## 2021-05-07 MED ORDER — OMEGA-3-ACID ETHYL ESTERS 1 G PO CAPS: 2.0000 g | ORAL_CAPSULE | Freq: Two times a day (BID) | ORAL | Status: DC

## 2021-05-07 MED ORDER — CLOPIDOGREL BISULFATE 75 MG PO TABS: 75.0000 mg | ORAL_TABLET | Freq: Every day | ORAL | 1 refills | Status: AC

## 2021-05-07 MED ORDER — FENOFIBRATE 160 MG PO TABS: 160.0000 mg | ORAL_TABLET | Freq: Every day | ORAL | 1 refills | Status: AC

## 2021-05-07 MED ORDER — ATORVASTATIN CALCIUM 80 MG PO TABS: 80.0000 mg | ORAL_TABLET | Freq: Every day | ORAL | 1 refills | Status: AC

## 2021-05-07 MED ORDER — LEVETIRACETAM 500 MG PO TABS: 500.0000 mg | ORAL_TABLET | Freq: Two times a day (BID) | ORAL | 1 refills | Status: AC

## 2021-05-07 MED ORDER — PANTOPRAZOLE SODIUM 40 MG PO TBEC: 40.0000 mg | DELAYED_RELEASE_TABLET | Freq: Every day | ORAL | 1 refills | Status: AC

## 2021-05-07 MED ORDER — FENOFIBRATE 160 MG PO TABS
160.0000 mg | ORAL_TABLET | Freq: Every day | ORAL | Status: DC
  Filled 2021-05-07: qty 1

## 2021-05-07 MED ORDER — OMEGA-3-ACID ETHYL ESTERS 1 G PO CAPS: 2.0000 g | ORAL_CAPSULE | Freq: Two times a day (BID) | ORAL | 1 refills | Status: AC

## 2021-05-07 NOTE — Plan of Care (Signed)

## 2021-05-07 NOTE — Discharge Summary (Signed)
Physician Discharge Summary   Mark Oneal VPX:[id number] DOB: Dec 23, 1985 DOA: 05/05/2021  PCP: Merryl Hacker, No  Admit date: 05/05/2021 Discharge date: 05/07/2021   Admitted From: home Disposition:  home Discharging physician: Dwyane Dee, MD  Recommendations for Outpatient Follow-up:  Follow up compliance with meds Repeat lipid panel in 3 months; follow up [place]:  Equipment/Devices:   Patient discharged to home in Discharge Condition: stable Risk of unplanned readmission score: Unplanned Admission- Pilot do not use: 25.33  CODE STATUS: Full Diet recommendation:  Diet Orders (From admission, onward)     Start     Ordered   05/06/21 1711  Diet regular Room service appropriate? Yes; Fluid consistency: Thin  Diet effective now       Question Answer Comment  Room service appropriate? Yes   Fluid consistency: Thin      05/06/21 1710            Hospital Course: [name] is a 35 year old Hispanic male with PMH CVA and possibly others (but patient now forth coming with PMH) but does state he is on "several meds" at home but cannot recall any names.  Of note, was also seen at [facility] on 8/10 possibly an ER visit and also noted to have elevated LFTs at that time as well.  He has been seen in the ER recently within the past month for questionable suicidal ideations and was evaluated by psychiatry.  He was not felt to be an imminent risk to himself nor others and was not recommended for any inpatient psychiatric hospitalization.  These episodes are also associated with the patient drinking large amounts of alcohol. He again presented to the ER on 4/62/7035 via police department in handcuffs for aggressive behavior and intoxication.  Police had reported that the wife called due to the patient acting aggressive towards their children while he was intoxicated.  Per note review the patient was "hitting his head against the window in the patrol car". It was reported that he was recently at  [facility] for detox of alcohol as well. He was again evaluated by psychiatry in the ER on 05/06/2021.  He again was not felt to be a harm to himself or others.  No psychiatric hospitalization was recommended.  He then underwent further work-up medically and was found to have elevated LFTs. AST 564, ALT 281, alk phos 204, T bili 4.3 CK 2571  He was admitted for further work-up for his liver dysfunction and monitoring for any alcohol withdrawal.  * Alcoholic hepatitis About- Patient reports 2-3 beers daily and also consumed a full bottle of vodka this past week.  Suspect he is still underreporting consumption.  States his last drink was Thursday night - Labs consistent with alcoholic hepatitis although INR not elevated -Madrey's DF = 12 points.  Will repeat again tomorrow but currently no indication for steroids - follow up hepatitis panel - RUQ u/s : Unremarkable parenchyma.  No obvious steatosis or cirrhosis noted -LFTs downtrending.  Informed patient about quitting drinking very strongly  Non-traumatic rhabdomyolysis - likely from severe aggression on admission - CK 2,571 on admission; improved to 1719 at discharge -Responded to fluids and encouraged patient to remain off of alcohol and continue hydration  Alcohol abuse - Suspect patient underreporting alcohol consumption - Last drink he reports was Thursday night -Did not have any withdrawal symptoms during hospitalization - Alcohol level 306 mg/dL on 05/05/2020  Elevated lipase - no true diagnosis of pancreatitis, does not meet criteria - lipase 84 -  Lipase level 80 at discharge - Etiology considered to be due to underlying severe hypertriglyceridemia as well as superimposed alcohol use.  Hyperlipidemia LDL goal <70 - Significantly elevated lipid panel in October 2021.  This improved on February 2022 repeat panel; appears that he probably ran out of medications since February due to lipemia noted on lab work this hospitalization -Lipid  profile was repeated: Cholesterol 383.  Triglycerides 1630 -Prescriptions were sent for Lipitor, fish oil, fenofibrate.  Patient heavily counseled on compliance with regimen and establishing with primary care at discharge.  Offered patient to remain in hospital for further triglyceride treatment and he declined  History of seizure - Noted to have seizures back in October 2021.  Neurology placed patient on Keppra at that time.  I also wonder if alcohol was contributing as well to a possible withdrawal seizure - Continue Keppra  History of CVA with residual deficit -patient not forth coming on PMH - unclear home meds; await med rec - hospitalized 06/2020: MRI brain showed multifocal infarcts involving right cerebrum and right basal ganglia.  Remote bilateral cerebellar and right midbrain insults. -Was recommended to remain on monotherapy Plavix after recovering from stroke; he was also continued on Keppra 500 mg twice daily (some concern that alcohol may have contributed to his seizure back then)  - repeat lipid panel (hyperlipemic in ER plus hx of elevated TG last year on lipids) -Did have some oropharyngeal dysphagia back then.  If any concern for aspiration or difficulty swallowing, will have SLP reevaluate - continue plavix     The patient's chronic medical conditions were treated accordingly per the patient's home medication regimen except as noted.  On day of discharge, patient was felt deemed stable for discharge. Patient/family member advised to call PCP or come back to ER if needed.   Principal Diagnosis: Alcoholic hepatitis  Discharge Diagnoses: Active Hospital Problems   Diagnosis Date Noted   Alcoholic hepatitis 78/29/5621    Priority: High   Non-traumatic rhabdomyolysis 05/06/2021    Priority: Medium   Alcohol abuse 12/21/2020    Priority: Low   Elevated lipase 05/06/2021   History of seizure 07/07/2020   Hyperlipidemia LDL goal <70 07/07/2020   History of CVA with  residual deficit     Resolved Hospital Problems  No resolved problems to display.    Discharge Instructions     Increase activity slowly   Complete by: As directed       Allergies as of 05/07/2021   No Known Allergies      Medication List     STOP taking these medications    docusate sodium 100 MG capsule Commonly known as: COLACE   naltrexone 50 MG tablet Commonly known as: DEPADE   senna-docusate 8.6-50 MG tablet Commonly known as: Senokot-S   traZODone 50 MG tablet Commonly known as: DESYREL       TAKE these medications    atorvastatin 80 MG tablet Commonly known as: LIPITOR Take 1 tablet (80 mg total) by mouth daily.   citalopram 20 MG tablet Commonly known as: CELEXA Take 1 tablet (20 mg total) by mouth at bedtime.   clopidogrel 75 MG tablet Commonly known as: PLAVIX Take 1 tablet (75 mg total) by mouth daily.   fenofibrate 160 MG tablet Take 1 tablet (160 mg total) by mouth daily.   levETIRAcetam 500 MG tablet Commonly known as: KEPPRA Take 1 tablet (500 mg total) by mouth 2 (two) times daily.   multivitamin with minerals Tabs tablet Take  1 tablet by mouth daily.   omega-3 acid ethyl esters 1 g capsule Commonly known as: LOVAZA Take 2 capsules (2 g total) by mouth 2 (two) times daily.   pantoprazole 40 MG tablet Commonly known as: PROTONIX Take 1 tablet (40 mg total) by mouth daily.        No Known Allergies  Consultations:   Discharge Exam: BP 137/88 (BP Location: Left Arm)   Pulse 72   Temp 97.8 F (36.6 C)   Resp 15   SpO2 100%  General appearance: alert, cooperative, and no distress Head: Normocephalic, without obvious abnormality, atraumatic Eyes:  EOMI Lungs: clear to auscultation bilaterally Heart: regular rate and rhythm and S1, S2 normal Abdomen:  Thin, soft, nondistended, nontender, bowel sounds present Extremities:  Thin, no edema Skin: mobility and turgor normal Neurologic: Grossly normal  The results of  significant diagnostics from this hospitalization (including imaging, microbiology, ancillary and laboratory) are listed below for reference.   Microbiology: Recent Results (from the past 240 hour(s))  Resp Panel by RT-PCR (Flu A&B, Covid) Nasopharyngeal Swab     Status: None   Collection Time: 05/06/21  4:25 PM   Specimen: Nasopharyngeal Swab; Nasopharyngeal(NP) swabs in vial transport medium  Result Value Ref Range Status   SARS Coronavirus 2 by RT PCR NEGATIVE NEGATIVE Final    Comment: (NOTE) SARS-CoV-2 target nucleic acids are NOT DETECTED.  The SARS-CoV-2 RNA is generally detectable in upper respiratory specimens during the acute phase of infection. The lowest concentration of SARS-CoV-2 viral copies this assay can detect is 138 copies/mL. A negative result does not preclude SARS-Cov-2 infection and should not be used as the sole basis for treatment or other patient management decisions. A negative result may occur with  improper specimen collection/handling, submission of specimen other than nasopharyngeal swab, presence of viral mutation(s) within the areas targeted by this assay, and inadequate number of viral copies(<138 copies/mL). A negative result must be combined with clinical observations, patient history, and epidemiological information. The expected result is Negative.  Fact Sheet for Patients:  EntrepreneurPulse.com.au  Fact Sheet for Healthcare Providers:  IncredibleEmployment.be  This test is no t yet approved or cleared by the [place] FDA and  has been authorized for detection and/or diagnosis of SARS-CoV-2 by FDA under an Emergency Use Authorization (EUA). This EUA will remain  in effect (meaning this test can be used) for the duration of the COVID-19 declaration under Section 564(b)(1) of the Act, 21 U.S.C.section 360bbb-3(b)(1), unless the authorization is terminated  or revoked sooner.       Influenza A by PCR  NEGATIVE NEGATIVE Final   Influenza B by PCR NEGATIVE NEGATIVE Final    Comment: (NOTE) The Xpert Xpress SARS-CoV-2/FLU/RSV plus assay is intended as an aid in the diagnosis of influenza from Nasopharyngeal swab specimens and should not be used as a sole basis for treatment. Nasal washings and aspirates are unacceptable for Xpert Xpress SARS-CoV-2/FLU/RSV testing.  Fact Sheet for Patients: EntrepreneurPulse.com.au  Fact Sheet for Healthcare Providers: IncredibleEmployment.be  This test is not yet approved or cleared by the [place] FDA and has been authorized for detection and/or diagnosis of SARS-CoV-2 by FDA under an Emergency Use Authorization (EUA). This EUA will remain in effect (meaning this test can be used) for the duration of the COVID-19 declaration under Section 564(b)(1) of the Act, 21 U.S.C. section 360bbb-3(b)(1), unless the authorization is terminated or revoked.  Performed at [facility], [address] [place], [place] [address]  Labs: BNP (last 3 results) No results for input(s): BNP in the last 8760 hours. Basic Metabolic Panel: Recent Labs  Lab 05/05/21 1500 05/06/21 1016 05/07/21 0541  NA 135 UNABLE TO REPORT DUE TO LIPEMIC INTERFERENCE 133*  K 4.0 3.9 3.7  CL 93* 89* 102  CO2 _0 GLUCOSE 106* 131* 112*  BUN _1 CREATININE 1.05 0.97 0.78  CALCIUM 7.9* 8.2* 8.0*  MG  --   --  1.8   Liver Function Tests: Recent Labs  Lab 05/05/21 1500 05/06/21 1016 05/07/21 0541  AST 511* 564* 462*  ALT 314* 281* 244*  ALKPHOS 212* 204* 239*  BILITOT 3.5* 4.3* 3.6*  PROT 6.0* UNABLE TO REPORT DUE TO LIPEMIC INTERFERENCE 4.9*  ALBUMIN 3.3* 3.0* 2.6*   Recent Labs  Lab 05/06/21 1016 05/07/21 0541  LIPASE 84* 80*   No results for input(s): AMMONIA in the last 168 hours. CBC: Recent Labs  Lab 05/05/21 1500 05/07/21 0541  WBC 6.3 4.5  NEUTROABS  --  2.1  HGB 15.9 12.8*  HCT 42.8  34.7*  MCV 91.8 93.3  PLT 163 87*   Cardiac Enzymes: Recent Labs  Lab 05/06/21 1016 05/07/21 0541  CKTOTAL 2,571* 1,719*   BNP: Invalid input(s): POCBNP CBG: No results for input(s): GLUCAP in the last 168 hours. D-Dimer No results for input(s): DDIMER in the last 72 hours. Hgb A1c No results for input(s): HGBA1C in the last 72 hours. Lipid Profile Recent Labs    05/07/21 0541  CHOL 383*  HDL NOT REPORTED DUE TO HIGH TRIGLYCERIDES  LDLCALC UNABLE TO CALCULATE IF TRIGLYCERIDE OVER 400 mg/dL  TRIG 1,630*  CHOLHDL NOT REPORTED DUE TO HIGH TRIGLYCERIDES  LDLDIRECT UNABLE TO CALCULATE IF TRIGLYCERIDE IS >1293 mg/dL   Thyroid function studies No results for input(s): TSH, T4TOTAL, T3FREE, THYROIDAB in the last 72 hours.  Invalid input(s): FREET3 Anemia work up No results for input(s): VITAMINB12, FOLATE, FERRITIN, TIBC, IRON, RETICCTPCT in the last 72 hours. Urinalysis    Component Value Date/Time   COLORURINE AMBER (A) 07/10/2020 0311   APPEARANCEUR HAZY (A) 07/10/2020 0311   LABSPEC 1.030 07/10/2020 0311   PHURINE 5.0 07/10/2020 0311   GLUCOSEU NEGATIVE 07/10/2020 0311   HGBUR NEGATIVE 07/10/2020 0311   BILIRUBINUR NEGATIVE 07/10/2020 0311   KETONESUR NEGATIVE 07/10/2020 0311   PROTEINUR 30 (A) 07/10/2020 0311   NITRITE NEGATIVE 07/10/2020 0311   LEUKOCYTESUR NEGATIVE 07/10/2020 0311   Sepsis Labs Invalid input(s): PROCALCITONIN,  WBC,  LACTICIDVEN Microbiology Recent Results (from the past 240 hour(s))  Resp Panel by RT-PCR (Flu A&B, Covid) Nasopharyngeal Swab     Status: None   Collection Time: 05/06/21  4:25 PM   Specimen: Nasopharyngeal Swab; Nasopharyngeal(NP) swabs in vial transport medium  Result Value Ref Range Status   SARS Coronavirus 2 by RT PCR NEGATIVE NEGATIVE Final    Comment: (NOTE) SARS-CoV-2 target nucleic acids are NOT DETECTED.  The SARS-CoV-2 RNA is generally detectable in upper respiratory specimens during the acute phase of infection.  The lowest concentration of SARS-CoV-2 viral copies this assay can detect is 138 copies/mL. A negative result does not preclude SARS-Cov-2 infection and should not be used as the sole basis for treatment or other patient management decisions. A negative result may occur with  improper specimen collection/handling, submission of specimen other than nasopharyngeal swab, presence of viral mutation(s) within the areas targeted by this assay, and inadequate number of viral copies(<138 copies/mL). A negative result must be combined with  clinical observations, patient history, and epidemiological information. The expected result is Negative.  Fact Sheet for Patients:  EntrepreneurPulse.com.au  Fact Sheet for Healthcare Providers:  IncredibleEmployment.be  This test is no t yet approved or cleared by the [place] FDA and  has been authorized for detection and/or diagnosis of SARS-CoV-2 by FDA under an Emergency Use Authorization (EUA). This EUA will remain  in effect (meaning this test can be used) for the duration of the COVID-19 declaration under Section 564(b)(1) of the Act, 21 U.S.C.section 360bbb-3(b)(1), unless the authorization is terminated  or revoked sooner.       Influenza A by PCR NEGATIVE NEGATIVE Final   Influenza B by PCR NEGATIVE NEGATIVE Final    Comment: (NOTE) The Xpert Xpress SARS-CoV-2/FLU/RSV plus assay is intended as an aid in the diagnosis of influenza from Nasopharyngeal swab specimens and should not be used as a sole basis for treatment. Nasal washings and aspirates are unacceptable for Xpert Xpress SARS-CoV-2/FLU/RSV testing.  Fact Sheet for Patients: EntrepreneurPulse.com.au  Fact Sheet for Healthcare Providers: IncredibleEmployment.be  This test is not yet approved or cleared by the [place] FDA and has been authorized for detection and/or diagnosis of SARS-CoV-2 by FDA  under an Emergency Use Authorization (EUA). This EUA will remain in effect (meaning this test can be used) for the duration of the COVID-19 declaration under Section 564(b)(1) of the Act, 21 U.S.C. section 360bbb-3(b)(1), unless the authorization is terminated or revoked.  Performed at [facility], [address] [place], [place] [address]     Procedures/Studies: US ABDOMEN LIMITED RUQ (LIVER/GB)  Result Date: 05/06/2021 CLINICAL DATA:  Elevated bilirubin EXAM: LIMITED ABDOMINAL ULTRASOUND - RIGHT UPPER QUADRANT COMPARISON:  None FINDINGS: Gallbladder: Physiologically distended without wall thickening, stones, or pericholecystic fluid. Common bile duct: Normal in caliber, 4.7 mm diameter. Liver: Parenchyma unremarkable. Antegrade color flow signal in the main portal vein. IMPRESSION: Negative.  Normal gallbladder. Electronically Signed   By: Lucrezia Europe M.D.   On: 05/06/2021 14:03     Time coordinating discharge: Over 30 minutes    Dwyane Dee, MD  [facility] 05/07/2021, 4:22 PM

## 2021-05-07 NOTE — TOC Progression Note (Addendum)
Transition of Care [facility]) - Progression Note    Patient Details  Name: Mark Oneal MRN: [id number] Date of Birth: 04-26-1986  [facility] CM/SW Contact  Barrie Dunker, RN Phone Number: 05/07/2021, 1:17 PM  Clinical Narrative:     The patient does not have a PCP and no insurance, I provided him with an [facility] application and explained for him to call to get an appointment to fill out the application He stated understanding, He stated that he needs transportation home, He signed the rider waiver and I faxed it to [facility] transport, Will arrange transportation once his bedside nurse is ready   Update, [facility] called to send a car, they will arrive to pick up the patient in a few min.    Expected Discharge Plan and Services           Expected Discharge Date: 05/07/21                                     Social Determinants of Health (SDOH) Interventions    Readmission Risk Interventions No flowsheet data found.

## 2021-05-07 NOTE — Progress Notes (Signed)
Pt provided discharge instructions and informed he must follow up with a PCP by this RN, MD and CM. CM arranged a ride and assisted to car by Meagan, NA.

## 2022-12-27 IMAGING — CT CT ANGIO HEAD
2 of 6 series · 5 of 30 positions shown · IV contrast (APPLIED)
Comparison: Prior CT and MR imaging

CLINICAL DATA: Possible right ICA dissection, follow-up stroke

EXAM:
CT ANGIOGRAPHY HEAD
TECHNIQUE: Multidetector CT imaging of the head was performed using the
standard protocol during bolus administration of intravenous
contrast. Multiplanar CT image reconstructions and MIPs were
obtained to evaluate the vascular anatomy.
CONTRAST:  75mL [id number] IOPAMIDOL ([id number]) INJECTION 76%

[Series 7: head angio · axial · 0.44mm/px · z∈[+1244,+1328]mm · 3 of 56 slices shown (1 of 2)]
[im 14/56  brain]
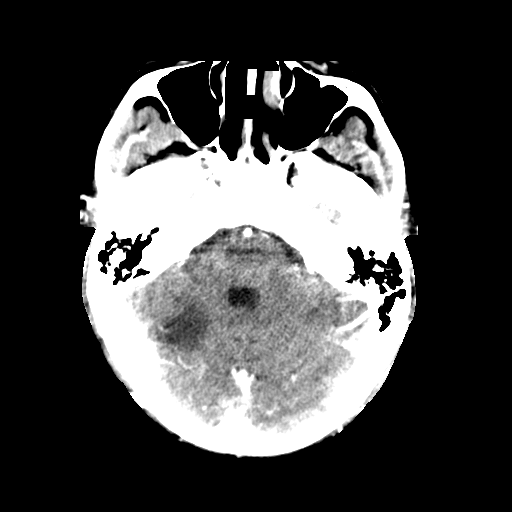
[im 28/56  bone]
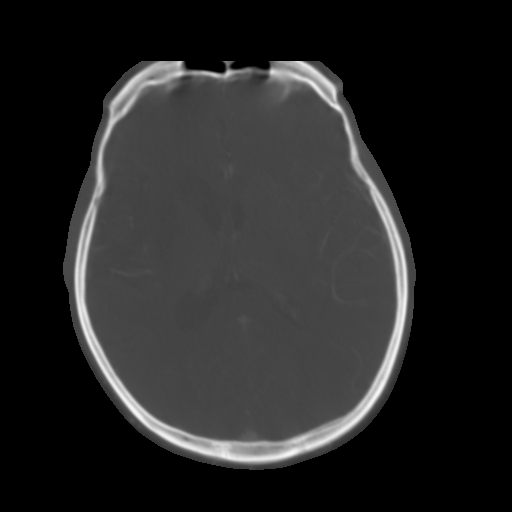
[im 42/56  brain]
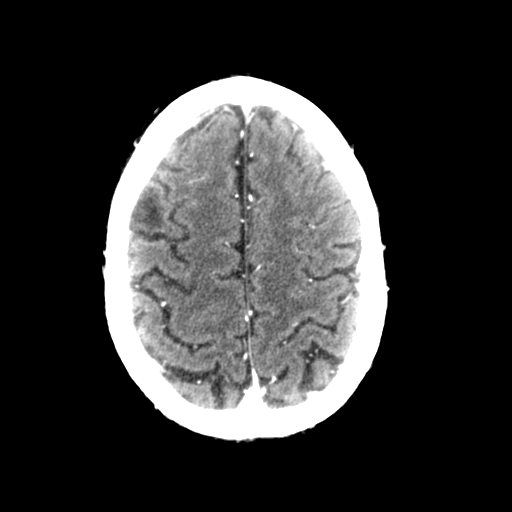

[Series 16: head angio · axial · 0.42mm/px · z∈[+1268,+1319]mm · 2 of 51 slices shown (2 of 2)]
[im 17/51  brain]
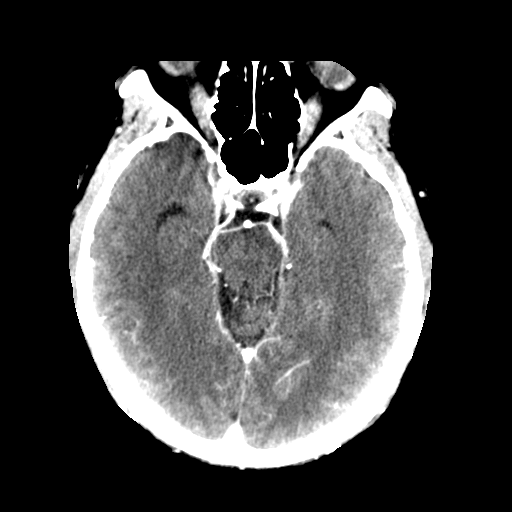
[im 34/51  brain]
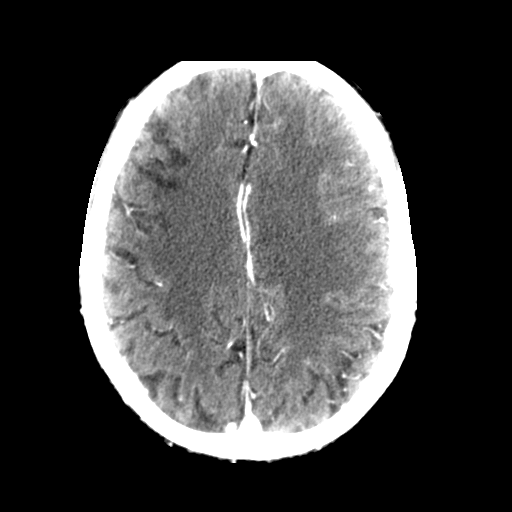

[5 of 30 positions shown; findings below may reference images not displayed]

FINDINGS: CT HEAD

Brain: There is no acute intracranial hemorrhage. Chronic infarcts
of the right frontal lobe involving middle and inferior gyri, right
putamen and caudate, and right insula with associated volume loss.
Chronic bilateral cerebellar infarcts. No hydrocephalus. No
extra-axial collection.

Vascular: Better evaluated on CTA portion.

Skull: Unremarkable.

Sinuses: Aerated.

Orbits: Unremarkable.

CTA HEAD

Anterior circulation: Intracranial internal carotid arteries are
patent. Focal mild calcified plaque along the left paraclinoid
portion. Anterior and middle cerebral arteries are patent.

Posterior circulation: Intracranial vertebral arteries are partially
imaged and patent. Basilar artery is patent. Posterior cerebral
arteries are patent. Bilateral posterior communicating arteries are
present.

Venous sinuses: As permitted by contrast timing, patent.
IMPRESSION: No acute intracranial abnormality.  Chronic infarcts as described.

Patent intracranial anterior and posterior circulations.
